# Patient Record
Sex: Male | Born: 1950 | Race: Black or African American | Hispanic: No | Marital: Married | Smoking: Former smoker
Health system: Southern US, Community
[De-identification: ages and names within clinical notes are randomized; demographics above are authoritative.]

## PROBLEM LIST (undated history)

## (undated) DIAGNOSIS — I513 Intracardiac thrombosis, not elsewhere classified: Secondary | ICD-10-CM

## (undated) DIAGNOSIS — I509 Heart failure, unspecified: Secondary | ICD-10-CM

## (undated) DIAGNOSIS — E785 Hyperlipidemia, unspecified: Secondary | ICD-10-CM

## (undated) DIAGNOSIS — I2510 Atherosclerotic heart disease of native coronary artery without angina pectoris: Secondary | ICD-10-CM

## (undated) DIAGNOSIS — I7121 Aneurysm of the ascending aorta, without rupture: Secondary | ICD-10-CM

## (undated) DIAGNOSIS — N2889 Other specified disorders of kidney and ureter: Secondary | ICD-10-CM

## (undated) DIAGNOSIS — I10 Essential (primary) hypertension: Secondary | ICD-10-CM

## (undated) DIAGNOSIS — I5020 Unspecified systolic (congestive) heart failure: Secondary | ICD-10-CM

## (undated) HISTORY — PX: CARDIAC CATHETERIZATION: SHX172

---

## 2020-06-06 ENCOUNTER — Ambulatory Visit: Payer: Self-pay | Attending: Internal Medicine

## 2020-06-06 DIAGNOSIS — Z23 Encounter for immunization: Secondary | ICD-10-CM

## 2020-06-06 NOTE — Progress Notes (Signed)
   Covid-19 Vaccination Clinic  Name:  Jevonte Clanton    MRN: [id number] DOB: [birth year]  06/06/2020  Mr. Burcher was observed post Covid-19 immunization for 15 minutes without incident. He was provided with Vaccine Information Sheet and instruction to access the V-Safe system.   Mr. Maready was instructed to call 911 with any severe reactions post vaccine: Marland Kitchen Difficulty breathing  . Swelling of face and throat  . A fast heartbeat  . A bad rash all over body  . Dizziness and weakness

## 2021-07-24 ENCOUNTER — Encounter: Payer: Self-pay | Admitting: Emergency Medicine

## 2021-07-24 ENCOUNTER — Other Ambulatory Visit: Payer: Self-pay

## 2021-07-24 ENCOUNTER — Ambulatory Visit
Admission: EM | Admit: 2021-07-24 | Discharge: 2021-07-24 | Disposition: A | Discharge status: Home / Self Care | Payer: Medicare Other | Attending: Physician Assistant | Admitting: Physician Assistant

## 2021-07-24 ENCOUNTER — Inpatient Hospital Stay (HOSPITAL_COMMUNITY)
Admission: EM | Admit: 2021-07-24 | Discharge: 2021-07-29 | DRG: 280 | Disposition: A | Discharge status: Home / Self Care | Payer: Medicare Other | Attending: Internal Medicine | Admitting: Internal Medicine

## 2021-07-24 ENCOUNTER — Emergency Department (HOSPITAL_COMMUNITY): Payer: Medicare Other

## 2021-07-24 DIAGNOSIS — R509 Fever, unspecified: Secondary | ICD-10-CM

## 2021-07-24 DIAGNOSIS — I110 Hypertensive heart disease with heart failure: Secondary | ICD-10-CM | POA: Diagnosis present

## 2021-07-24 DIAGNOSIS — R778 Other specified abnormalities of plasma proteins: Secondary | ICD-10-CM | POA: Diagnosis present

## 2021-07-24 DIAGNOSIS — I214 Non-ST elevation (NSTEMI) myocardial infarction: Principal | ICD-10-CM | POA: Diagnosis present

## 2021-07-24 DIAGNOSIS — I5041 Acute combined systolic (congestive) and diastolic (congestive) heart failure: Secondary | ICD-10-CM | POA: Diagnosis present

## 2021-07-24 DIAGNOSIS — N2889 Other specified disorders of kidney and ureter: Secondary | ICD-10-CM

## 2021-07-24 DIAGNOSIS — Z20822 Contact with and (suspected) exposure to covid-19: Secondary | ICD-10-CM | POA: Diagnosis present

## 2021-07-24 DIAGNOSIS — I10 Essential (primary) hypertension: Secondary | ICD-10-CM

## 2021-07-24 DIAGNOSIS — J101 Influenza due to other identified influenza virus with other respiratory manifestations: Secondary | ICD-10-CM | POA: Diagnosis present

## 2021-07-24 DIAGNOSIS — F17210 Nicotine dependence, cigarettes, uncomplicated: Secondary | ICD-10-CM | POA: Diagnosis present

## 2021-07-24 DIAGNOSIS — I2510 Atherosclerotic heart disease of native coronary artery without angina pectoris: Secondary | ICD-10-CM | POA: Diagnosis present

## 2021-07-24 DIAGNOSIS — I5082 Biventricular heart failure: Secondary | ICD-10-CM | POA: Diagnosis present

## 2021-07-24 DIAGNOSIS — E871 Hypo-osmolality and hyponatremia: Secondary | ICD-10-CM | POA: Diagnosis present

## 2021-07-24 DIAGNOSIS — I160 Hypertensive urgency: Secondary | ICD-10-CM

## 2021-07-24 DIAGNOSIS — T502X5A Adverse effect of carbonic-anhydrase inhibitors, benzothiadiazides and other diuretics, initial encounter: Secondary | ICD-10-CM | POA: Diagnosis present

## 2021-07-24 DIAGNOSIS — I7121 Aneurysm of the ascending aorta, without rupture: Secondary | ICD-10-CM | POA: Diagnosis present

## 2021-07-24 DIAGNOSIS — E861 Hypovolemia: Secondary | ICD-10-CM | POA: Diagnosis present

## 2021-07-24 DIAGNOSIS — I255 Ischemic cardiomyopathy: Secondary | ICD-10-CM | POA: Diagnosis present

## 2021-07-24 DIAGNOSIS — K7460 Unspecified cirrhosis of liver: Secondary | ICD-10-CM | POA: Diagnosis present

## 2021-07-24 DIAGNOSIS — D72819 Decreased white blood cell count, unspecified: Secondary | ICD-10-CM | POA: Diagnosis present

## 2021-07-24 DIAGNOSIS — I513 Intracardiac thrombosis, not elsewhere classified: Secondary | ICD-10-CM | POA: Diagnosis present

## 2021-07-24 DIAGNOSIS — N179 Acute kidney failure, unspecified: Secondary | ICD-10-CM | POA: Diagnosis present

## 2021-07-24 DIAGNOSIS — J111 Influenza due to unidentified influenza virus with other respiratory manifestations: Secondary | ICD-10-CM

## 2021-07-24 DIAGNOSIS — D696 Thrombocytopenia, unspecified: Secondary | ICD-10-CM | POA: Diagnosis present

## 2021-07-24 DIAGNOSIS — C649 Malignant neoplasm of unspecified kidney, except renal pelvis: Secondary | ICD-10-CM | POA: Diagnosis present

## 2021-07-24 DIAGNOSIS — E785 Hyperlipidemia, unspecified: Secondary | ICD-10-CM | POA: Diagnosis present

## 2021-07-24 DIAGNOSIS — E876 Hypokalemia: Secondary | ICD-10-CM | POA: Diagnosis present

## 2021-07-24 DIAGNOSIS — Z79899 Other long term (current) drug therapy: Secondary | ICD-10-CM

## 2021-07-24 HISTORY — DX: Intracardiac thrombosis, not elsewhere classified: I51.3

## 2021-07-24 HISTORY — DX: Aneurysm of the ascending aorta, without rupture: I71.21

## 2021-07-24 HISTORY — DX: Essential (primary) hypertension: I10

## 2021-07-24 HISTORY — DX: Atherosclerotic heart disease of native coronary artery without angina pectoris: I25.10

## 2021-07-24 HISTORY — DX: Hyperlipidemia, unspecified: E78.5

## 2021-07-24 HISTORY — DX: Unspecified systolic (congestive) heart failure: I50.20

## 2021-07-24 HISTORY — DX: Other specified disorders of kidney and ureter: N28.89

## 2021-07-24 LAB — BASIC METABOLIC PANEL
Anion gap: 7 (ref 5–15)
BUN: 19 mg/dL (ref 8–23)
CO2: 27 mmol/L (ref 22–32)
Calcium: 8.2 mg/dL — ABNORMAL LOW (ref 8.9–10.3)
Chloride: 101 mmol/L (ref 98–111)
Creatinine, Ser: 1.56 mg/dL — ABNORMAL HIGH (ref 0.61–1.24)
GFR, Estimated: 47 mL/min — ABNORMAL LOW (ref >60)
Glucose, Bld: 110 mg/dL — ABNORMAL HIGH (ref 70–99)
Potassium: 4.4 mmol/L (ref 3.5–5.1)
Sodium: 135 mmol/L (ref 135–145)

## 2021-07-24 LAB — CBC WITH DIFFERENTIAL/PLATELET
Abs Immature Granulocytes: 0.02 10*3/uL (ref 0.00–0.07)
Basophils Absolute: 0 10*3/uL (ref 0.0–0.1)
Basophils Relative: 1 %
Eosinophils Absolute: 0 10*3/uL (ref 0.0–0.5)
Eosinophils Relative: 0 %
HCT: 47.2 % (ref 39.0–52.0)
Hemoglobin: 15.6 g/dL (ref 13.0–17.0)
Immature Granulocytes: 0 %
Lymphocytes Relative: 28 %
Lymphs Abs: 1.5 10*3/uL (ref 0.7–4.0)
MCH: 31.9 pg (ref 26.0–34.0)
MCHC: 33.1 g/dL (ref 30.0–36.0)
MCV: 96.5 fL (ref 80.0–100.0)
Monocytes Absolute: 0.5 10*3/uL (ref 0.1–1.0)
Monocytes Relative: 10 %
Neutro Abs: 3.3 10*3/uL (ref 1.7–7.7)
Neutrophils Relative %: 61 %
Platelets: 111 10*3/uL — ABNORMAL LOW (ref 150–400)
RBC: 4.89 MIL/uL (ref 4.22–5.81)
RDW: 15.3 % (ref 11.5–15.5)
WBC: 5.4 10*3/uL (ref 4.0–10.5)
nRBC: 0 % (ref 0.0–0.2)

## 2021-07-24 LAB — TROPONIN I (HIGH SENSITIVITY): Troponin I (High Sensitivity): 159 ng/L (ref <18)

## 2021-07-24 LAB — POCT FASTING CBG KUC MANUAL ENTRY: POCT Glucose (KUC): 117 mg/dL — AB (ref 70–99)

## 2021-07-24 LAB — RESP PANEL BY RT-PCR (FLU A&B, COVID) ARPGX2
Influenza A by PCR: POSITIVE — AB
Influenza B by PCR: NEGATIVE
SARS Coronavirus 2 by RT PCR: NEGATIVE

## 2021-07-24 IMAGING — DX DG CHEST 2V
2 series · 2 of 2 positions shown · non-contrast
Comparison: [DATE].

CLINICAL DATA: Cough, congestion, sore throat.

EXAM:
CHEST - 2 VIEW

[w chest pa]
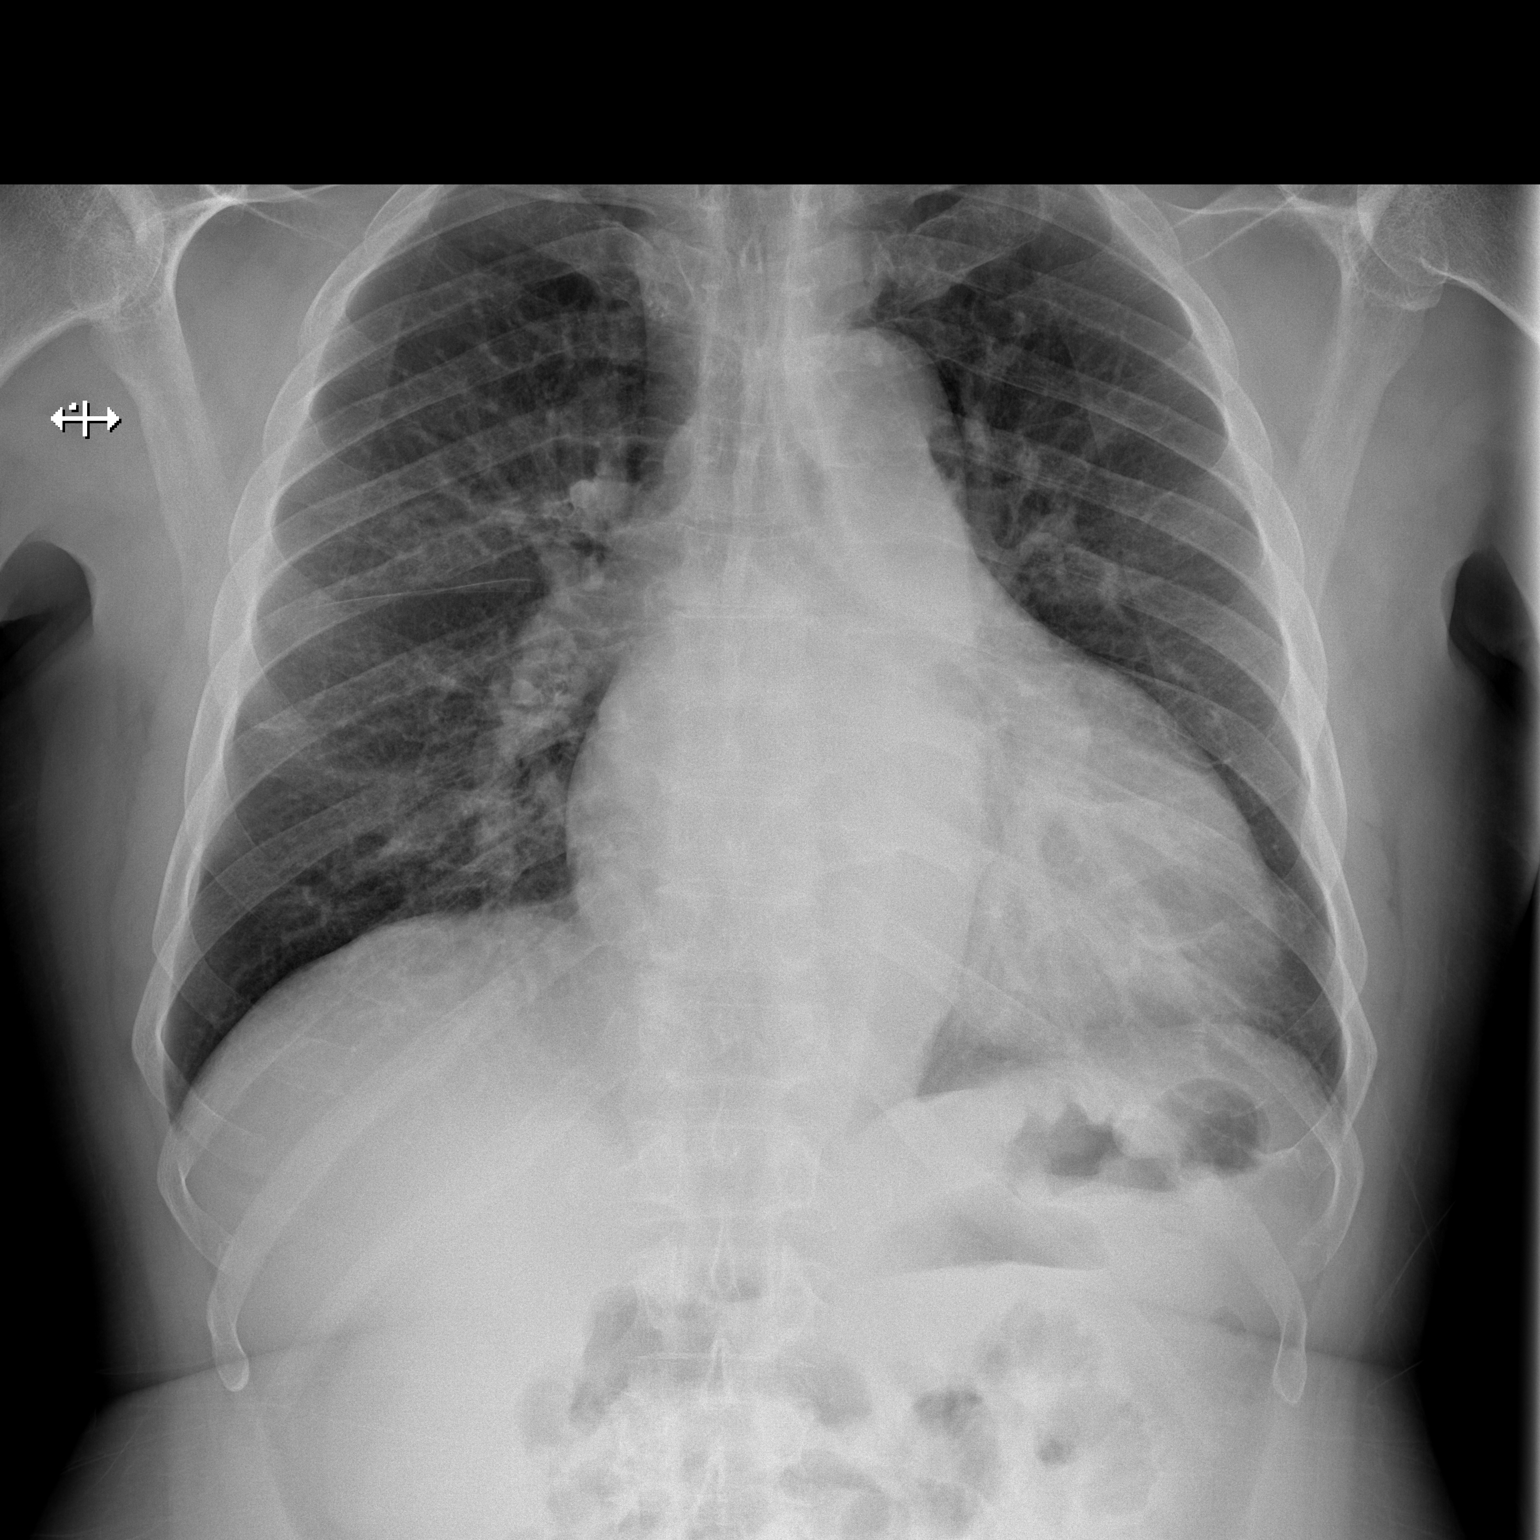

[w chest lat]
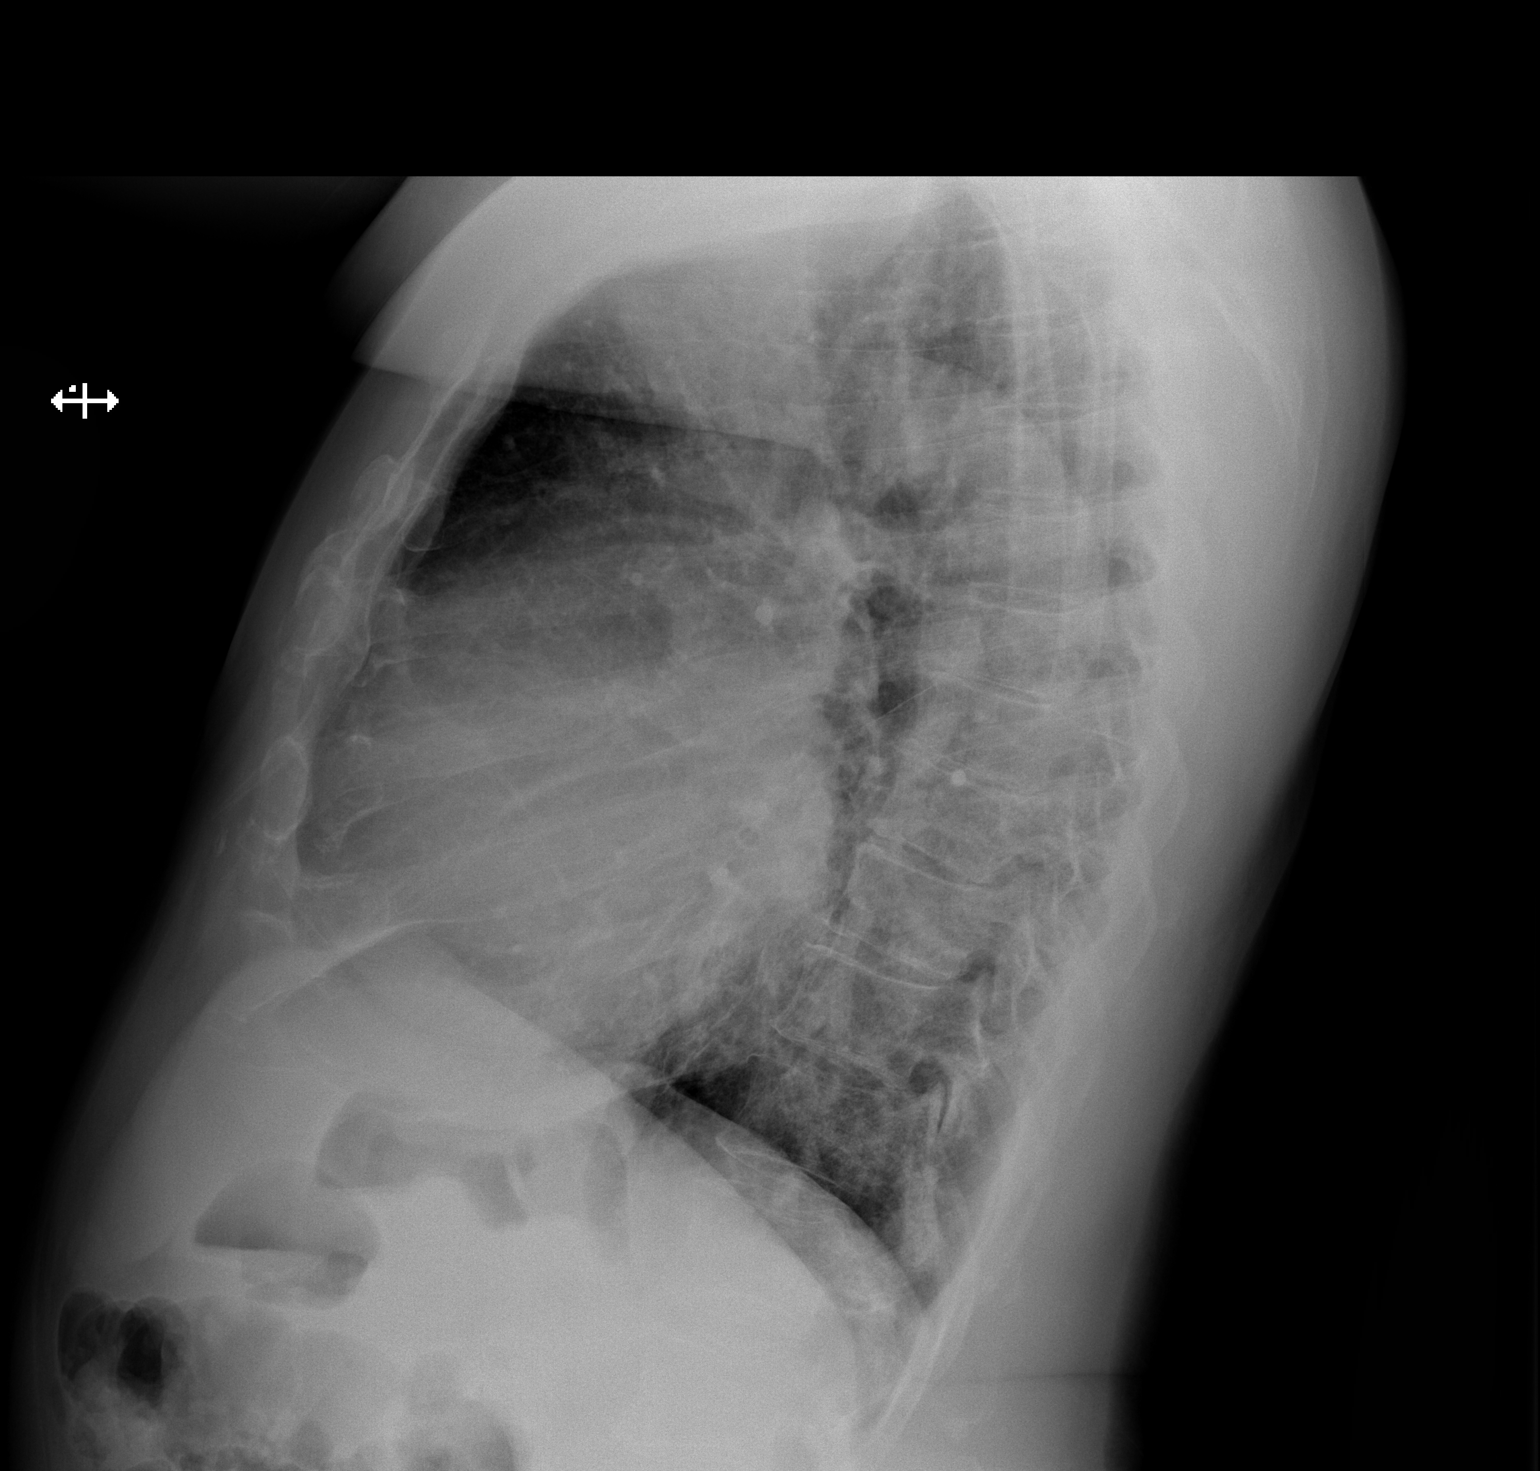

[2 of 2 positions shown; findings below may reference images not displayed]

FINDINGS: The heart is enlarged. The mediastinal structures are within normal
limits. No consolidation, effusion, or pneumothorax is seen. No
acute osseous abnormality.
IMPRESSION: Cardiomegaly with no acute process.

## 2021-07-24 MED ORDER — AMLODIPINE BESYLATE 5 MG PO TABS
5.0000 mg | ORAL_TABLET | Freq: Once | ORAL | Status: AC
  Administered 2021-07-24: 5 mg via ORAL
  Filled 2021-07-24: qty 1

## 2021-07-24 MED ORDER — OSELTAMIVIR PHOSPHATE 75 MG PO CAPS: 75.0000 mg | ORAL_CAPSULE | Freq: Once | ORAL | Status: DC

## 2021-07-24 MED ORDER — LABETALOL HCL 5 MG/ML IV SOLN
5.0000 mg | Freq: Once | INTRAVENOUS | Status: AC
  Administered 2021-07-24: 5 mg via INTRAVENOUS
  Filled 2021-07-24: qty 4

## 2021-07-24 NOTE — ED Triage Notes (Signed)
Yesterday checked BP and realized it was very high, also noticed he was fatigued with a temperature. Covid test at home was negative. Chronic cough, worse since yesterday. Trx fever with motrin. Family states he hasn't been himself, has seemed disoriented since yesterday around 3 pm. Pt c/o chest pain that started Wednesday.

## 2021-07-24 NOTE — ED Provider Notes (Signed)
Emergency Medicine Provider Triage Evaluation Note  Troy May , a 70 y.o. male  was evaluated in triage.  Pt complains of cough, fever with T-max of 106 F per patient, and elevated blood pressure at home for the last 3 days.  He is vaccinated for COVID and influenza.  Endorses some shortness of breath with cough as well as chest wall pain with coughing.  Review of Systems  Positive: Cough, shortness of breath, chest wall pain, fevers, weakness Negative: Diarrhea, nausea, vomiting  Physical Exam  BP (!) 166/119 (BP Location: Right Arm)   Pulse 80   Temp (!) 100.5 F (38.1 C) (Oral)   Resp 18   Ht 5\' 10"  (1.778 m)   Wt 90.7 kg   SpO2 96%   BMI 28.70 kg/m  Gen:   Awake, no distress   Resp:  Normal effort  MSK:   Moves extremities without difficulty  Other:  RRR no m/r/g; lungs CTAB  Medical Decision Making  Medically screening exam initiated at 7:36 PM.  Appropriate orders placed.  Matei Magnone was informed that the remainder of the evaluation will be completed by another provider, this initial triage assessment does not replace that evaluation, and the importance of remaining in the ED until their evaluation is complete.  This chart was dictated using voice recognition software, Dragon. Despite the best efforts of this provider to proofread and correct errors, errors may still occur which can change documentation meaning.    Aura Dials 07/24/21 Roseanne Reno, MD 07/24/21 2027

## 2021-07-24 NOTE — ED Triage Notes (Signed)
Pt c/o high blood pressure, fever, and a cough for the past couple of days.

## 2021-07-24 NOTE — ED Provider Notes (Signed)
[facility] EMERGENCY DEPARTMENT Provider Note   CSN: [id number] Arrival date & time: 07/24/21  1848     History Chief Complaint  Patient presents with   Hypertension   Cough    Troy May is a 70 y.o. male.  The history is provided by the patient and medical records.  Cough Cough characteristics:  Non-productive Sputum characteristics:  Unable to specify Severity:  Moderate Onset quality:  Gradual Duration:  3 days Timing:  Constant Progression:  Worsening Smoker: no   Context: sick contacts and upper respiratory infection   Relieved by:  Nothing Worsened by:  Nothing Associated symptoms: chest pain, chills and fever   Associated symptoms: no ear pain, no rash, no shortness of breath and no sore throat       History reviewed. No pertinent past medical history.  Patient Active Problem List   Diagnosis Date Noted   NSTEMI (non-ST elevated myocardial infarction) ([place]) 07/25/2021   Influenza A 07/25/2021   Essential hypertension 07/25/2021   AKI (acute kidney injury) ([place]) 07/25/2021   Elevated troponin 07/25/2021    History reviewed. No pertinent surgical history.     History reviewed. No pertinent family history.  Social History   Tobacco Use   Smoking status: Some Days    Types: Cigarettes    Home Medications Prior to Admission medications   Medication Sig Start Date End Date Taking? Authorizing Provider  cetirizine (ZYRTEC) 10 MG tablet Take 10 mg by mouth daily as needed for allergies.   Yes [provider]  ibuprofen (ADVIL) 200 MG tablet Take 600 mg by mouth every 6 (six) hours as needed for fever or headache.   Yes [provider]  OVER THE COUNTER MEDICATION Take 1 tablet by mouth daily. gummy   Yes [provider]    Allergies    Patient has no known allergies.  Review of Systems   Review of Systems  Constitutional:  Positive for chills, fatigue and fever.  HENT:  Negative for ear pain and  sore throat.   Eyes:  Negative for pain and visual disturbance.  Respiratory:  Positive for cough. Negative for shortness of breath.   Cardiovascular:  Positive for chest pain. Negative for palpitations.  Gastrointestinal:  Negative for abdominal pain and vomiting.  Genitourinary:  Negative for dysuria and hematuria.  Musculoskeletal:  Negative for arthralgias and back pain.  Skin:  Negative for color change and rash.  Neurological:  Negative for seizures and syncope.  All other systems reviewed and are negative.  Physical Exam Updated Vital Signs BP (!) 140/107   Pulse 65   Temp 98.3 F (36.8 C) (Oral)   Resp 19   Ht 5\' 10"  (1.778 m)   Wt 90.7 kg   SpO2 93%   BMI 28.70 kg/m   Physical Exam Vitals and nursing note reviewed.  Constitutional:      General: He is not in acute distress.    Appearance: Normal appearance. He is well-developed. He is not toxic-appearing or diaphoretic.  HENT:     Head: Normocephalic and atraumatic.     Right Ear: External ear normal.     Left Ear: External ear normal.     Nose: Nose normal. No congestion.     Mouth/Throat:     Mouth: Mucous membranes are moist.     Pharynx: Oropharynx is clear. No posterior oropharyngeal erythema.  Eyes:     Extraocular Movements: Extraocular movements intact.     Conjunctiva/sclera: Conjunctivae normal.  Pupils: Pupils are equal, round, and reactive to light.  Cardiovascular:     Rate and Rhythm: Normal rate and regular rhythm.     Pulses: Normal pulses.     Heart sounds: No murmur heard. Pulmonary:     Effort: Pulmonary effort is normal. No respiratory distress.     Breath sounds: Normal breath sounds. No wheezing, rhonchi or rales.  Abdominal:     General: Abdomen is flat. Bowel sounds are normal.     Palpations: Abdomen is soft.     Tenderness: There is no abdominal tenderness. There is no guarding or rebound.  Musculoskeletal:        General: No swelling, tenderness or deformity. Normal range of  motion.     Cervical back: Normal range of motion and neck supple. No rigidity.  Skin:    General: Skin is warm and dry.     Capillary Refill: Capillary refill takes less than 2 seconds.     Findings: No rash.  Neurological:     General: No focal deficit present.     Mental Status: He is alert and oriented to person, place, and time.     Motor: No weakness.     Coordination: Coordination normal.     Gait: Gait normal.  Psychiatric:        Mood and Affect: Mood normal.    ED Results / Procedures / Treatments   Labs (all labs ordered are listed, but only abnormal results are displayed) Labs Reviewed  RESP PANEL BY RT-PCR (FLU A&B, COVID) ARPGX2 - Abnormal; Notable for the following components:      Result Value   Influenza A by PCR POSITIVE (*)    All other components within normal limits  BASIC METABOLIC PANEL - Abnormal; Notable for the following components:   Glucose, Bld 110 (*)    Creatinine, Ser 1.56 (*)    Calcium 8.2 (*)    GFR, Estimated 47 (*)    All other components within normal limits  CBC WITH DIFFERENTIAL/PLATELET - Abnormal; Notable for the following components:   Platelets 111 (*)    All other components within normal limits  BRAIN NATRIURETIC PEPTIDE - Abnormal; Notable for the following components:   B Natriuretic Peptide 1,111.1 (*)    All other components within normal limits  BASIC METABOLIC PANEL - Abnormal; Notable for the following components:   Sodium 132 (*)    Creatinine, Ser 1.28 (*)    Calcium 7.8 (*)    All other components within normal limits  LIPID PANEL - Abnormal; Notable for the following components:   HDL 39 (*)    LDL Cholesterol 123 (*)    All other components within normal limits  TROPONIN I (HIGH SENSITIVITY) - Abnormal; Notable for the following components:   Troponin I (High Sensitivity) 159 (*)    All other components within normal limits  TROPONIN I (HIGH SENSITIVITY) - Abnormal; Notable for the following components:    Troponin I (High Sensitivity) 161 (*)    All other components within normal limits  URINALYSIS, ROUTINE W REFLEX MICROSCOPIC  HIV ANTIBODY (ROUTINE TESTING W REFLEX)  HEPARIN LEVEL (UNFRACTIONATED)    EKG EKG Interpretation  Date/Time:  Friday July 24 2021 21:24:34 EST Ventricular Rate:  95 PR Interval:  164 QRS Duration: 112 QT Interval:  354 QTC Calculation: 445 R Axis:   131 Text Interpretation: Sinus rhythm Probable left atrial enlargement Anterolateral infarct, age indeterminate Baseline wander in lead(s) V3 Confirmed by Ripley Fraise [phone])  on 07/24/2021 11:31:41 PM  Radiology DG Chest 2 View  Result Date: 07/24/2021 CLINICAL DATA:  Cough, congestion, sore throat. EXAM: CHEST - 2 VIEW COMPARISON:  06/28/2008. FINDINGS: The heart is enlarged. The mediastinal structures are within normal limits. No consolidation, effusion, or pneumothorax is seen. No acute osseous abnormality. IMPRESSION: Cardiomegaly with no acute process. Electronically Signed   By: Brett Fairy M.D.   On: 07/24/2021 21:14   CT Angio Chest Pulmonary Embolism (PE) W or WO Contrast  Result Date: 07/25/2021 CLINICAL DATA:  Fever, fatigue.  Evaluate for pulmonary embolism. EXAM: CT ANGIOGRAPHY CHEST WITH CONTRAST TECHNIQUE: Multidetector CT imaging of the chest was performed using the standard protocol during bolus administration of intravenous contrast. Multiplanar CT image reconstructions and MIPs were obtained to evaluate the vascular anatomy. CONTRAST:  164mL OMNIPAQUE IOHEXOL 350 MG/ML SOLN COMPARISON:  None. FINDINGS: Vascular Findings: There is adequate opacification of the pulmonary arterial system with the main pulmonary artery measuring 419 Hounsfield units. There are no discrete filling defects within the pulmonary arterial tree to suggest pulmonary embolism. Borderline enlarged caliber the main pulmonary artery measuring 33 mm in diameter. Marked cardiomegaly, in particular, there is enlargement of  the left ventricle. Coronary artery calcifications. No pericardial effusion though a small amount of fluid is seen within the pericardial recess. Mild fusiform aneurysmal dilatation of the ascending thoracic aorta measuring 43 mm in diameter (image 55, series 7). No evidence of an intramural hematoma. Review of the MIP images confirms the above findings. ---------------------------------------------------------------------------------- Nonvascular Findings: Mediastinum/Lymph Nodes: Evaluation for mediastinal and hilar adenopathy is degraded secondary to tailoring the examination for evaluation of the pulmonary arteries. Scattered mediastinal lymph nodes are mildly prominent though not enlarged by size criteria with index prevascular lymph node measuring 0.9 cm in greatest short axis diameter (image 43, series 8). No definitive bulky mediastinal, hilar or axillary lymphadenopathy. Lungs/Pleura: Circumferential bronchial wall thickening, most conspicuously involving the bilateral lower lobes. Scattered ill-defined somewhat tree-in-bud ground-glass opacities are seen with the right upper (images 50 and 55, series 8) and lower lobes (image 77 and 86, series 8). No associated air bronchograms. Minimal bibasilar ground-glass atelectasis. No pleural effusion or pneumothorax. Apical predominant centrilobular and paraseptal emphysematous change. There is a punctate (3 mm) pulmonary nodule within right middle lobe (image 73, series 8). Upper abdomen: Limited early arterial phase evaluation of the upper abdomen demonstrates isoattenuating bilateral renal masses, the right measuring 5.5 x 4.6 cm (image 129, series 7, and the left measuring 4.8 x 4.1 cm (image 159, series 7). Mild nodularity of the hepatic contour as could be seen in the setting of cirrhotic change. Musculoskeletal: No acute or aggressive osseous abnormalities. Regional soft tissues appear normal. Normal appearance of the thyroid gland. IMPRESSION: 1. No  evidence of pulmonary embolism. 2. Diffuse bronchial wall thickening with scattered ill-defined somewhat tree-in-bud ground-glass opacities within the right upper and lower lobes, nonspecific though could be seen in the setting of early atypical infection superimposed on bronchitis/airways disease. 3. Bilateral isoattenuating renal masses, the right measuring 5.5 cm and the left measuring 3.8 cm, incompletely evaluated on the present examination, though worrisome for renal cell carcinomas. Further evaluation with nonemergent renal protocol CT or MRI could be performed as indicated. 4.  Emphysema (ICD10-J43.9). 5. Punctate (3 mm) right middle lobe pulmonary nodule. Comparison with prior outside examinations is advised. Pending the results of the above recommended dedicated renal imaging, a non-contrast chest CT can be considered in 12 months given patient's emphysematous change. This recommendation follows  the consensus statement: Guidelines for Management of Incidental Pulmonary Nodules Detected on CT Images: From the Fleischner Society 2017; Radiology 2017; 284:228-243. 6. 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 Recommend annual imaging followup by CTA or MRA. This recommendation follows 2010 ACCF/AHA/AATS/ACR/ASA/SCA/SCAI/SIR/STS/SVM Guidelines for the Diagnosis and Management of Patients with Thoracic Aortic Disease. Circulation. 2010; 121: W098-J191. Aortic aneurysm NOS (ICD10-I71.9) 7. Marked cardiomegaly with enlargement of the caliber the main pulmonary artery, nonspecific though could be seen in the setting pulmonary arterial hypertension. Further evaluation with cardiac echo could be performed as indicated. 8. Coronary artery calcifications. Aortic Atherosclerosis (ICD10-I70.0). 9. Mild nodularity hepatic contour as could be seen in the setting of cirrhotic change. Correlation with LFTs is advised. Electronically Signed   By: Sandi Mariscal M.D.   On:  07/25/2021 12:18   ECHOCARDIOGRAM COMPLETE  Result Date: 07/25/2021    ECHOCARDIOGRAM REPORT   Patient Name:   Troy May Date of Exam: 07/25/2021 Medical Rec #:  [id number]  Height:       70.0 in Accession #:    [id number] Weight:       200.0 lb Date of Birth:  Mar 25, 1951  BSA:          2.087 m Patient Age:    89 years   BP:           126/91 mmHg Patient Gender: M          HR:           76 bpm. Exam Location:  Inpatient Procedure: 2D Echo, Cardiac Doppler, Color Doppler and Intracardiac            Opacification Agent STAT ECHO Indications:    Acute ischemic heart disease, chest pain, elevated troponin  History:        Patient has no prior history of Echocardiogram examinations.                 Risk Factors:Hypertension.  Sonographer:    Maudry Mayhew MHA, RDMS, RVT, RDCS Referring Phys: [id number] [place]  1. Left ventricular ejection fraction, by estimation, is 25 to 30%. The left ventricle has severely decreased function. The left ventricle demonstrates regional wall motion abnormalities (see scoring diagram/findings for description). The left ventricular internal cavity size was mildly dilated. There is mild left ventricular hypertrophy. Left ventricular diastolic parameters are consistent with Grade II diastolic dysfunction (pseudonormalization). Elevated left atrial pressure.  2. Large LV apical thrombus measuring 2.5cm x 1.8cm  3. Right ventricular systolic function is moderately reduced. The right ventricular size is moderately enlarged. There is mildly elevated pulmonary artery systolic pressure. The estimated right ventricular systolic pressure is 13.2 mmHg.  4. Right atrial size was mildly dilated.  5. The mitral valve is normal in structure. Trivial mitral valve regurgitation.  6. The aortic valve is tricuspid. Aortic valve regurgitation is not visualized. No aortic stenosis is present.  7. The inferior vena cava is dilated in size with >50% respiratory variability, suggesting  right atrial pressure of 8 mmHg. FINDINGS  Left Ventricle: Left ventricular ejection fraction, by estimation, is 25 to 30%. The left ventricle has severely decreased function. The left ventricle demonstrates regional wall motion abnormalities. Definity contrast agent was given IV to delineate the left ventricular endocardial borders. The left ventricular internal cavity size was mildly dilated. There is mild left ventricular hypertrophy. Left ventricular diastolic parameters are consistent with Grade II diastolic dysfunction (pseudonormalization). Elevated left atrial pressure.  LV Wall  Scoring: The mid and distal lateral wall, entire apex, entire inferior wall, and posterior wall are akinetic. The anterior wall, antero-lateral wall, anterior septum, mid inferoseptal segment, and basal inferoseptal segment are normal. Right Ventricle: The right ventricular size is moderately enlarged. Right vetricular wall thickness was not well visualized. Right ventricular systolic function is moderately reduced. There is mildly elevated pulmonary artery systolic pressure. The tricuspid regurgitant velocity is 2.73 m/s, and with an assumed right atrial pressure of 8 mmHg, the estimated right ventricular systolic pressure is 70.9 mmHg. Left Atrium: Left atrial size was normal in size. Right Atrium: Right atrial size was mildly dilated. Pericardium: There is no evidence of pericardial effusion. Mitral Valve: The mitral valve is normal in structure. Trivial mitral valve regurgitation. Tricuspid Valve: The tricuspid valve is normal in structure. Tricuspid valve regurgitation is trivial. Aortic Valve: The aortic valve is tricuspid. Aortic valve regurgitation is not visualized. No aortic stenosis is present. Aortic valve mean gradient measures 1.0 mmHg. Aortic valve peak gradient measures 3.0 mmHg. Aortic valve area, by VTI measures 2.18 cm. Pulmonic Valve: The pulmonic valve was not well visualized. Pulmonic valve regurgitation is not  visualized. Aorta: The aortic root is normal in size and structure. Venous: The inferior vena cava is dilated in size with greater than 50% respiratory variability, suggesting right atrial pressure of 8 mmHg. IAS/Shunts: The interatrial septum was not well visualized.  LEFT VENTRICLE PLAX 2D LVIDd:         5.80 cm   Diastology LVIDs:         5.30 cm   LV e' medial:    4.53 cm/s LV PW:         1.10 cm   LV E/e' medial:  15.6 LV IVS:        1.20 cm   LV e' lateral:   5.22 cm/s LVOT diam:     1.70 cm   LV E/e' lateral: 13.5 LV SV:         28 LV SV Index:   13 LVOT Area:     2.27 cm  RIGHT VENTRICLE RV S prime:     7.01 cm/s TAPSE (M-mode): 1.1 cm LEFT ATRIUM           Index        RIGHT ATRIUM           Index LA diam:      4.30 cm 2.06 cm/m   RA Area:     22.10 cm LA Vol (A2C): 58.0 ml 27.79 ml/m  RA Volume:   75.50 ml  36.17 ml/m LA Vol (A4C): 63.5 ml 30.42 ml/m  AORTIC VALVE AV Area (Vmax):    1.70 cm AV Area (Vmean):   1.74 cm AV Area (VTI):     2.18 cm AV Vmax:           86.10 cm/s AV Vmean:          50.400 cm/s AV VTI:            0.128 m AV Peak Grad:      3.0 mmHg AV Mean Grad:      1.0 mmHg LVOT Vmax:         64.30 cm/s LVOT Vmean:        38.600 cm/s LVOT VTI:          0.123 m LVOT/AV VTI ratio: 0.96  AORTA Ao Root diam: 3.20 cm MITRAL VALVE  TRICUSPID VALVE MV Area (PHT): 7.51 cm    TR Peak grad:   29.8 mmHg MV Decel Time: 101 msec    TR Vmax:        273.00 cm/s MR Peak grad: 44.5 mmHg MR Vmax:      333.50 cm/s  SHUNTS MV E velocity: 70.70 cm/s  Systemic VTI:  0.12 m MV A velocity: 46.30 cm/s  Systemic Diam: 1.70 cm MV E/A ratio:  1.53 Oswaldo Milian MD Electronically signed by Oswaldo Milian MD Signature Date/Time: 07/25/2021/12:12:47 PM    Final     Procedures Procedures   Medications Ordered in ED Medications  lactated ringers infusion ( Intravenous New Bag/Given 07/25/21 0223)  acetaminophen (TYLENOL) tablet 650 mg (has no administration in time range)    Or   acetaminophen (TYLENOL) suppository 650 mg (has no administration in time range)  ondansetron (ZOFRAN) tablet 4 mg (has no administration in time range)    Or  ondansetron (ZOFRAN) injection 4 mg (has no administration in time range)  senna-docusate (Senokot-S) tablet 1 tablet (has no administration in time range)  aspirin EC tablet 81 mg (81 mg Oral Not Given 07/25/21 1037)  amLODipine (NORVASC) tablet 10 mg (10 mg Oral Not Given 07/25/21 1038)  losartan (COZAAR) tablet 50 mg (50 mg Oral Given 07/25/21 0224)  oseltamivir (TAMIFLU) capsule 30 mg (30 mg Oral Not Given 07/25/21 1038)  perflutren lipid microspheres (DEFINITY) IV suspension (3 mLs Intravenous Given 07/25/21 0900)  heparin ADULT infusion 100 units/mL (25000 units/279mL) (1,450 Units/hr Intravenous New Bag/Given 07/25/21 1053)  amLODipine (NORVASC) tablet 5 mg (5 mg Oral Given 07/24/21 2214)  labetalol (NORMODYNE) injection 5 mg (5 mg Intravenous Given 07/24/21 2213)  heparin bolus via infusion 2,700 Units (2,700 Units Intravenous Bolus from Bag 07/25/21 1053)  iohexol (OMNIPAQUE) 350 MG/ML injection 100 mL (100 mLs Intravenous Contrast Given 07/25/21 1156)    ED Course  I have reviewed the triage vital signs and the nursing notes.  Pertinent labs & imaging results that were available during my care of the patient were reviewed by me and considered in my medical decision making (see chart for details).    MDM Rules/Calculators/A&P                         Previously healthy 70 year old gentleman with no diagnosed medical problems who is presenting with chronic cough, worsening over the past 3 days with associated fevers, chills, fatigue. Now w/ chest pain that occurs when coughing. He is hypertensive on arrival.  Mildly febrile.  Otherwise hemodynamically stable.  Exam as above.  Low concern for hypertensive emergency.  His neuro exam is nonfocal.  He has no current headache. No recent vision changes. No indications for head  imaging at this time. Pt given labetalol iv push and started on amlodipine. EKG shows inferior Q waves, but no acute ST elevations or depressions.  Initial troponin elevated to 159. Delta pending.  No indications for heparin gtt at this time. BNP elevated to 1111. No current chest pain.  May have new onset HF and would benefit from admission for further cardiac evaluation w/ echo +/-  diuresis. He has no o2 req. Mildly tachypneic but no respiratory distress. Flu test positive. Cough may be related to viral illness versus new HF.  Chest x-ray showed cardiomegaly without focal consolidation or interstitial edema. No significant peripheral edema on exam. Will defer diuresis to inpatient team. Medicine team contacted for admission. Hand off given.  Final Clinical Impression(s) / ED Diagnoses Final diagnoses:  Influenza  Hypertension, unspecified type  NSTEMI (non-ST elevated myocardial infarction) [facility])    Rx / [place] Orders ED Discharge Orders     None        Idamae Lusher, MD 07/25/21 1303    Luna Fuse, MD 07/29/21 1430

## 2021-07-25 ENCOUNTER — Observation Stay (HOSPITAL_COMMUNITY): Payer: Medicare Other

## 2021-07-25 ENCOUNTER — Inpatient Hospital Stay (HOSPITAL_COMMUNITY): Payer: Medicare Other

## 2021-07-25 ENCOUNTER — Encounter (HOSPITAL_COMMUNITY): Payer: Self-pay | Admitting: Family Medicine

## 2021-07-25 DIAGNOSIS — E785 Hyperlipidemia, unspecified: Secondary | ICD-10-CM | POA: Diagnosis present

## 2021-07-25 DIAGNOSIS — T502X5A Adverse effect of carbonic-anhydrase inhibitors, benzothiadiazides and other diuretics, initial encounter: Secondary | ICD-10-CM | POA: Diagnosis present

## 2021-07-25 DIAGNOSIS — R778 Other specified abnormalities of plasma proteins: Secondary | ICD-10-CM | POA: Diagnosis present

## 2021-07-25 DIAGNOSIS — I5082 Biventricular heart failure: Secondary | ICD-10-CM | POA: Diagnosis present

## 2021-07-25 DIAGNOSIS — J101 Influenza due to other identified influenza virus with other respiratory manifestations: Secondary | ICD-10-CM | POA: Diagnosis present

## 2021-07-25 DIAGNOSIS — I110 Hypertensive heart disease with heart failure: Secondary | ICD-10-CM | POA: Diagnosis present

## 2021-07-25 DIAGNOSIS — I21A1 Myocardial infarction type 2: Secondary | ICD-10-CM | POA: Diagnosis not present

## 2021-07-25 DIAGNOSIS — E861 Hypovolemia: Secondary | ICD-10-CM | POA: Diagnosis present

## 2021-07-25 DIAGNOSIS — I5041 Acute combined systolic (congestive) and diastolic (congestive) heart failure: Secondary | ICD-10-CM | POA: Diagnosis present

## 2021-07-25 DIAGNOSIS — K7460 Unspecified cirrhosis of liver: Secondary | ICD-10-CM | POA: Diagnosis present

## 2021-07-25 DIAGNOSIS — N179 Acute kidney failure, unspecified: Secondary | ICD-10-CM

## 2021-07-25 DIAGNOSIS — I2510 Atherosclerotic heart disease of native coronary artery without angina pectoris: Secondary | ICD-10-CM | POA: Diagnosis present

## 2021-07-25 DIAGNOSIS — Z79899 Other long term (current) drug therapy: Secondary | ICD-10-CM | POA: Diagnosis not present

## 2021-07-25 DIAGNOSIS — F17210 Nicotine dependence, cigarettes, uncomplicated: Secondary | ICD-10-CM | POA: Diagnosis present

## 2021-07-25 DIAGNOSIS — I10 Essential (primary) hypertension: Secondary | ICD-10-CM | POA: Diagnosis not present

## 2021-07-25 DIAGNOSIS — E876 Hypokalemia: Secondary | ICD-10-CM | POA: Diagnosis present

## 2021-07-25 DIAGNOSIS — I5089 Other heart failure: Secondary | ICD-10-CM | POA: Diagnosis not present

## 2021-07-25 DIAGNOSIS — C649 Malignant neoplasm of unspecified kidney, except renal pelvis: Secondary | ICD-10-CM | POA: Diagnosis present

## 2021-07-25 DIAGNOSIS — R7989 Other specified abnormal findings of blood chemistry: Secondary | ICD-10-CM | POA: Diagnosis not present

## 2021-07-25 DIAGNOSIS — D72819 Decreased white blood cell count, unspecified: Secondary | ICD-10-CM | POA: Diagnosis present

## 2021-07-25 DIAGNOSIS — D696 Thrombocytopenia, unspecified: Secondary | ICD-10-CM | POA: Diagnosis present

## 2021-07-25 DIAGNOSIS — R079 Chest pain, unspecified: Secondary | ICD-10-CM

## 2021-07-25 DIAGNOSIS — I7121 Aneurysm of the ascending aorta, without rupture: Secondary | ICD-10-CM | POA: Diagnosis present

## 2021-07-25 DIAGNOSIS — I248 Other forms of acute ischemic heart disease: Secondary | ICD-10-CM

## 2021-07-25 DIAGNOSIS — Z20822 Contact with and (suspected) exposure to covid-19: Secondary | ICD-10-CM | POA: Diagnosis present

## 2021-07-25 DIAGNOSIS — I513 Intracardiac thrombosis, not elsewhere classified: Secondary | ICD-10-CM | POA: Diagnosis present

## 2021-07-25 DIAGNOSIS — I214 Non-ST elevation (NSTEMI) myocardial infarction: Secondary | ICD-10-CM | POA: Diagnosis present

## 2021-07-25 DIAGNOSIS — E871 Hypo-osmolality and hyponatremia: Secondary | ICD-10-CM | POA: Diagnosis present

## 2021-07-25 DIAGNOSIS — I5021 Acute systolic (congestive) heart failure: Secondary | ICD-10-CM | POA: Diagnosis not present

## 2021-07-25 DIAGNOSIS — I255 Ischemic cardiomyopathy: Secondary | ICD-10-CM | POA: Diagnosis present

## 2021-07-25 LAB — ECHOCARDIOGRAM COMPLETE
AR max vel: 1.7 cm2
AV Area VTI: 2.18 cm2
AV Area mean vel: 1.74 cm2
AV Mean grad: 1 mmHg
AV Peak grad: 3 mmHg
Ao pk vel: 0.86 m/s
Area-P 1/2: 7.51 cm2
Height: 70 in
MV M vel: 3.34 m/s
MV Peak grad: 44.5 mmHg
S' Lateral: 5.3 cm
Weight: 3200 oz

## 2021-07-25 LAB — URINALYSIS, ROUTINE W REFLEX MICROSCOPIC
Bilirubin Urine: NEGATIVE
Glucose, UA: NEGATIVE mg/dL
Hgb urine dipstick: NEGATIVE
Ketones, ur: NEGATIVE mg/dL
Leukocytes,Ua: NEGATIVE
Nitrite: NEGATIVE
Protein, ur: NEGATIVE mg/dL
Specific Gravity, Urine: 1.018 (ref 1.005–1.030)
pH: 5 (ref 5.0–8.0)

## 2021-07-25 LAB — BASIC METABOLIC PANEL
Anion gap: 8 (ref 5–15)
BUN: 18 mg/dL (ref 8–23)
CO2: 23 mmol/L (ref 22–32)
Calcium: 7.8 mg/dL — ABNORMAL LOW (ref 8.9–10.3)
Chloride: 101 mmol/L (ref 98–111)
Creatinine, Ser: 1.28 mg/dL — ABNORMAL HIGH (ref 0.61–1.24)
GFR, Estimated: >60 mL/min (ref >60)
Glucose, Bld: 93 mg/dL (ref 70–99)
Potassium: 3.9 mmol/L (ref 3.5–5.1)
Sodium: 132 mmol/L — ABNORMAL LOW (ref 135–145)

## 2021-07-25 LAB — HEPARIN LEVEL (UNFRACTIONATED): Heparin Unfractionated: 0.99 IU/mL — ABNORMAL HIGH (ref 0.30–0.70)

## 2021-07-25 LAB — TROPONIN I (HIGH SENSITIVITY): Troponin I (High Sensitivity): 161 ng/L (ref <18)

## 2021-07-25 LAB — LIPID PANEL
Cholesterol: 171 mg/dL (ref 0–200)
HDL: 39 mg/dL — ABNORMAL LOW (ref >40)
LDL Cholesterol: 123 mg/dL — ABNORMAL HIGH (ref 0–99)
Total CHOL/HDL Ratio: 4.4 RATIO
Triglycerides: 47 mg/dL (ref <150)
VLDL: 9 mg/dL (ref 0–40)

## 2021-07-25 LAB — BRAIN NATRIURETIC PEPTIDE: B Natriuretic Peptide: 1111.1 pg/mL — ABNORMAL HIGH (ref 0.0–100.0)

## 2021-07-25 LAB — HIV ANTIBODY (ROUTINE TESTING W REFLEX): HIV Screen 4th Generation wRfx: NONREACTIVE

## 2021-07-25 IMAGING — MR MR ABDOMEN WO/W CM
4 of 6 series · 17 of 48 positions shown · IV contrast (Yes MH)
Comparison: No prior abdominal MRI.  Chest CTA [DATE].

CLINICAL DATA: 70-year-old male with history of renal mass.

EXAM:
MRI ABDOMEN WITHOUT AND WITH CONTRAST
TECHNIQUE: Multiplanar multisequence MR imaging of the abdomen was performed
both before and after the administration of intravenous contrast.
CONTRAST:  8mL GADAVIST GADOBUTROL 1 MMOL/ML IV SOLN, 8.4mL GADAVIST
GADOBUTROL 1 MMOL/ML IV SOLN

[Series 2: cor ssfse navigator · coronal · 6.0mm · 0.78mm/px · 6 of 39 slices shown]
[im 1/39]
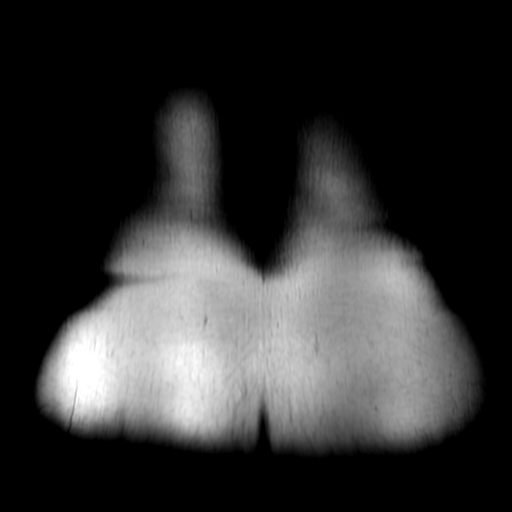
[im 8/39]
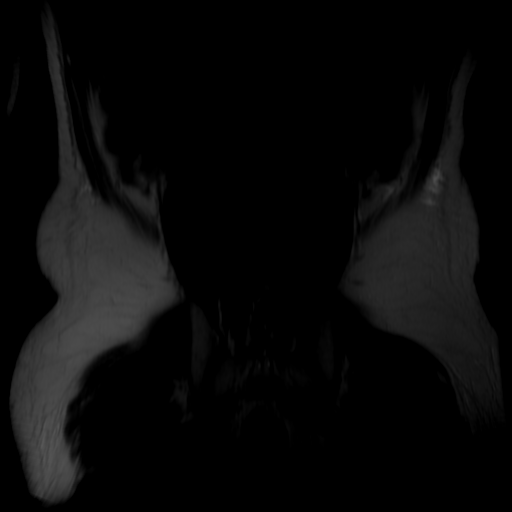
[im 16/39]
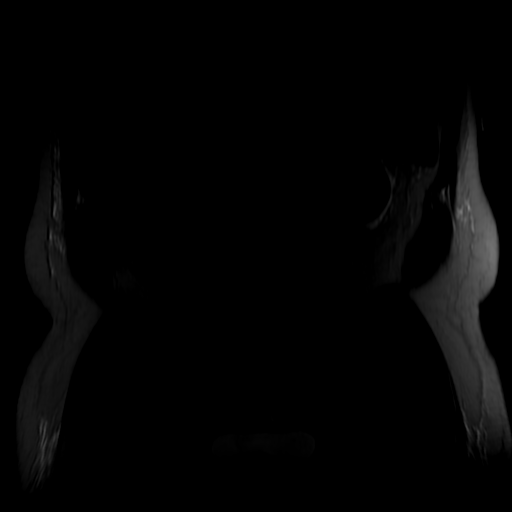
[im 23/39]
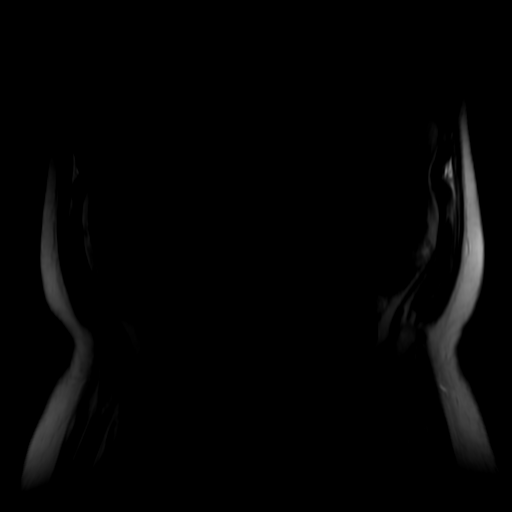
[im 31/39]
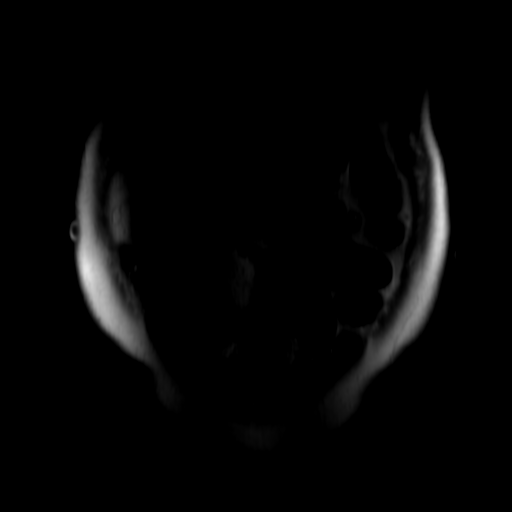
[im 39/39]
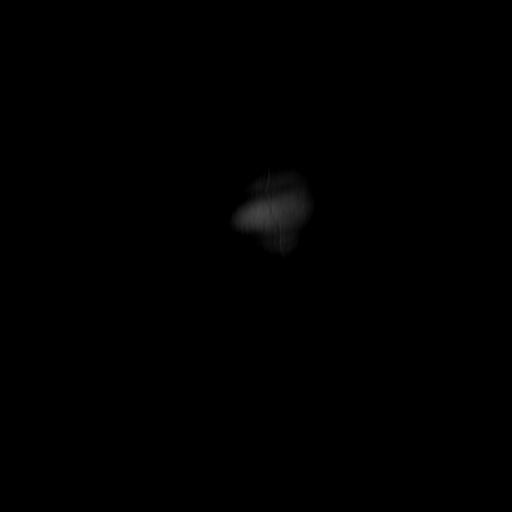

[Series 3: ax ssfse navigator · axial · 6.0mm · 0.74mm/px · z∈[-37,+175]mm · 5 of 33 slices shown]
[im 1/33]
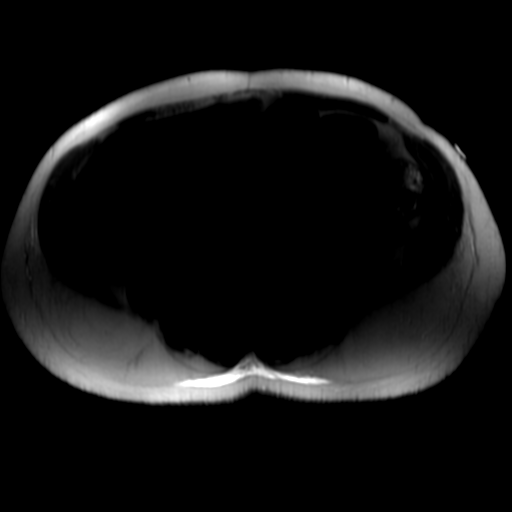
[im 7/33]
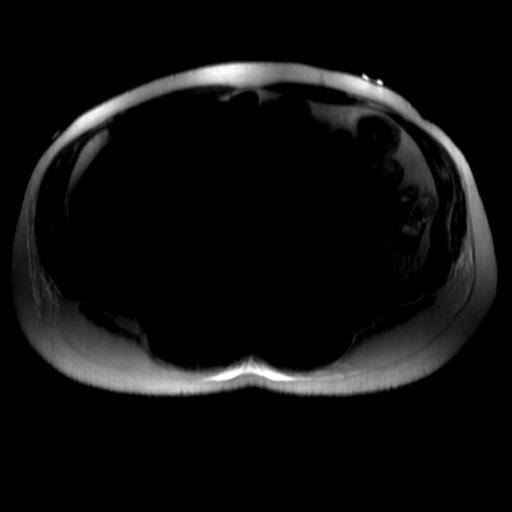
[im 13/33]
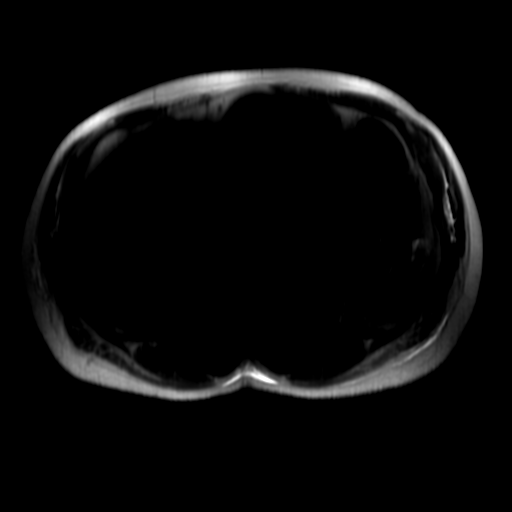
[im 20/33]
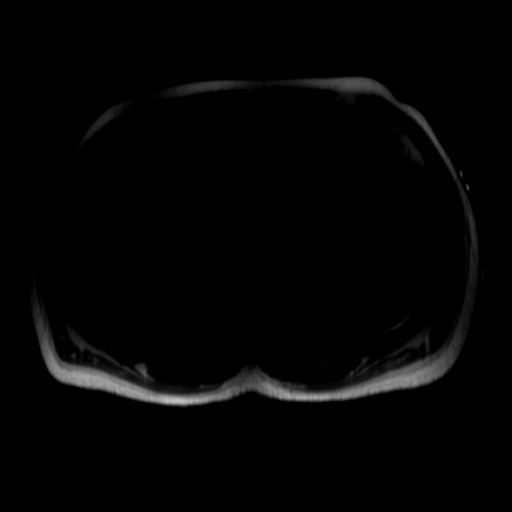
[im 33/33]
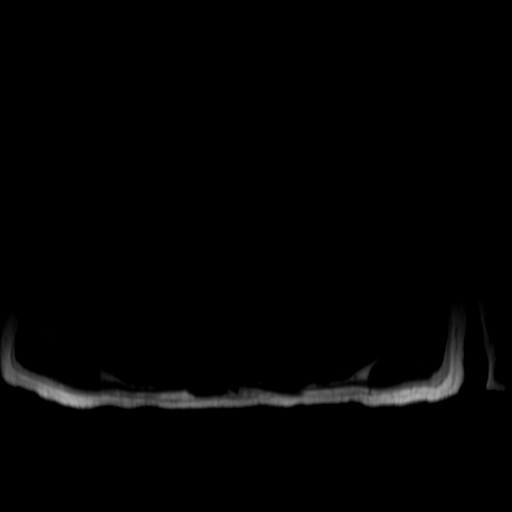

[Series 4: bSSFP fat-sat · axial · 5.0mm · 0.78mm/px · z∈[-6,+156]mm · 3 of 34 slices shown]
[im 7/34]
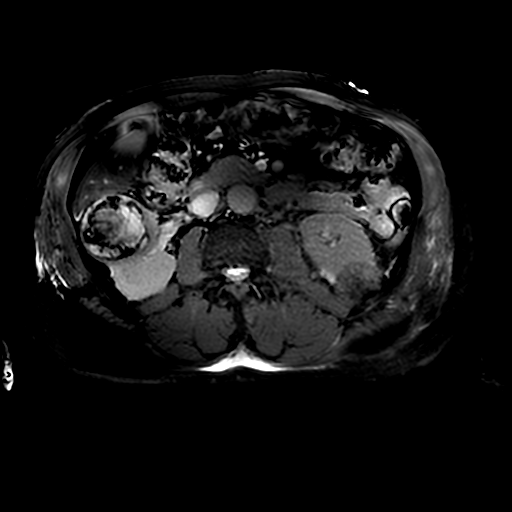
[im 20/34]
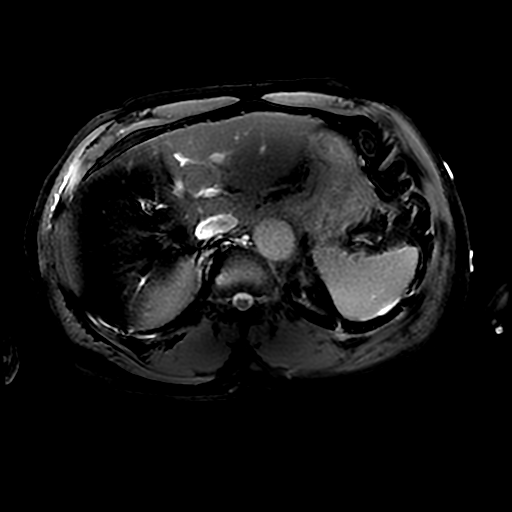
[im 34/34]
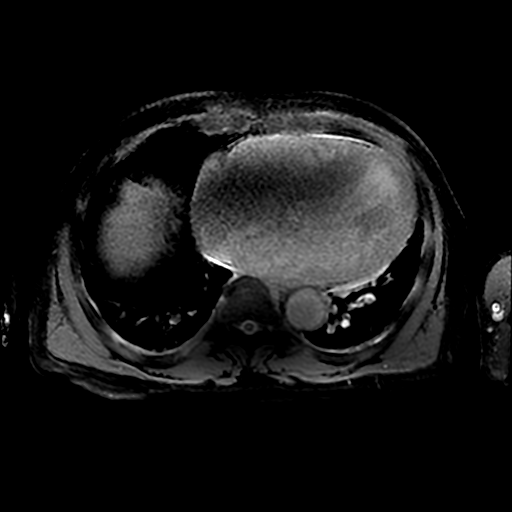

[Series 5: T1 dynamic · axial · non-contrast · 4.0mm · 0.82mm/px · z∈[-49,+135]mm · 3 of 60 slices shown]
[im 7/60]
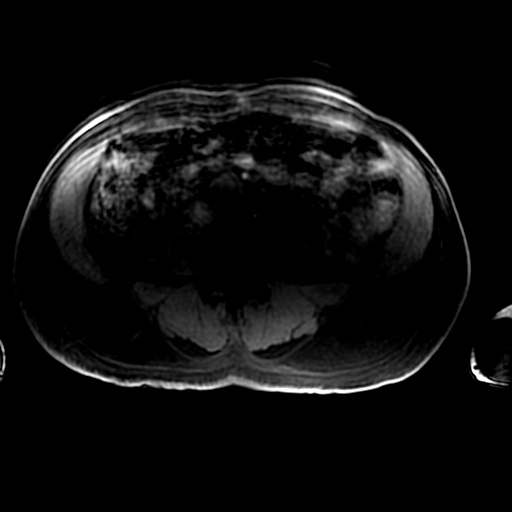
[im 33/60]
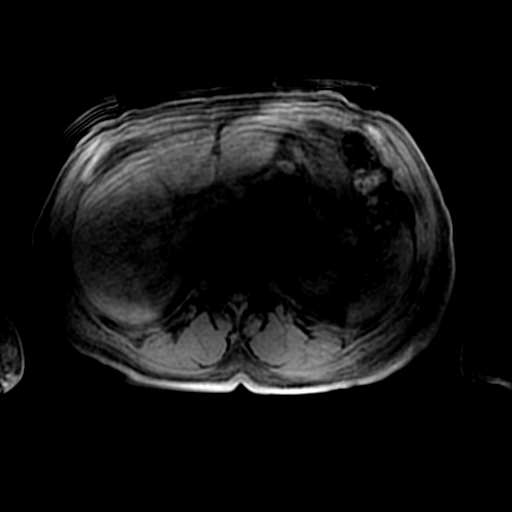
[im 53/60]
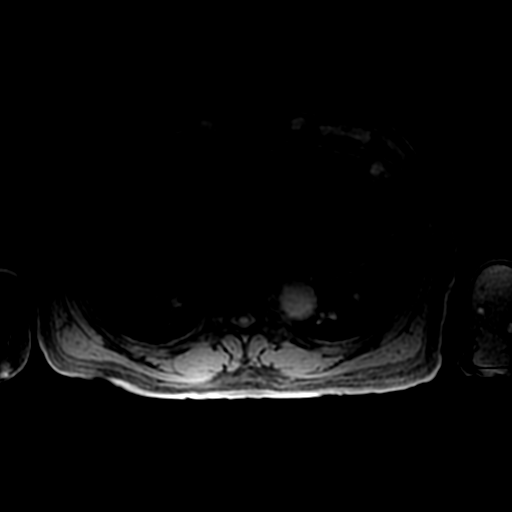

[17 of 48 positions shown; findings below may reference images not displayed]

FINDINGS: Comment: Portions of today's examination are severely limited by
large amount of patient respiratory motion.

Lower chest: Cardiomegaly with left ventricular dilatation, rounding
of the apex of the left ventricle, and focal area of increased T1
signal intensity in the apex of the left ventricle, corresponding to
a filling defect on post gadolinium images, indicative of
intracardiac thrombus.

Hepatobiliary: Liver has a slightly shrunken appearance and nodular
contour, indicative of underlying cirrhosis. No discrete cystic or
solid hepatic lesions. No intra or extrahepatic biliary ductal
dilatation. Gallbladder is normal in appearance.

Pancreas: No pancreatic mass. No pancreatic ductal dilatation. No
pancreatic or peripancreatic fluid collections or inflammatory
changes.

Spleen:  Unremarkable.

Adrenals/Urinary Tract: The lesions of concern in the kidneys
bilaterally are heterogeneous in signal intensity. The largest of
these lesions is in the lateral aspect of the interpolar region of
the right kidney (axial image 78 of series [G3]) measuring 5.6 x
cm, predominantly T1 hyperintense and T2 hypointense with no
definitive internal enhancement on post gadolinium imaging (although
subtraction imaging is severely limited by variable degrees of
inspiration). The smaller lesion in the posterior aspect of the
interpolar region of the left kidney measures 3.9 x 3.8 cm (axial
image 85 of series [G3]) and is more heterogeneous in T1 signal
intensity with some areas of T1 hyperintensity, predominantly T2
hypointense, with equivocal low-level enhancement on post gadolinium
imaging. Importantly, however, there is a smaller lesion in the
anterior aspect of the lower pole of the right kidney (axial image
92 of series [G3]) which measures 2.2 x 1.9 cm and is T1
hypointense, poorly visualized on T2 weighted images but
predominantly mildly T2 hypointense, and demonstrates some low-level
post gadolinium enhancement, concerning for neoplasm. These lesions
are all encapsulated within Gerota's fascia and are separated from
the renal veins which are widely patent bilaterally. No
hydroureteronephrosis. Bilateral adrenal glands are normal in
appearance.

Stomach/Bowel: Visualized portions are unremarkable.

Vascular/Lymphatic: Aortic atherosclerosis with fusiform aneurysmal
dilatation of the infrarenal abdominal aorta which measures up to 3
cm in diameter in the visualized portions of the abdomen. No
definite lymphadenopathy confidently identified in the abdomen.

Other: No significant volume of ascites noted in the visualized
portions of the peritoneal cavity.

Musculoskeletal: No aggressive appearing osseous lesions are noted
in the visualized portions of the skeleton.
IMPRESSION: 1. There are renal lesions bilaterally concerning for probable renal
cell carcinoma, favored to represent papillary renal cell
carcinomas, as discussed above. In addition, there is a larger
lesion in the lateral aspect of the interpolar region of the right
kidney which is favored to represent a large hemorrhagic cyst.
Urologic consultation is recommended.
2. Intracardiac thrombus in the apex of the left ventricle, which
places the patient at risk for systemic embolization. Consultation
with Cardiology for clinical management is recommended.
3. Cirrhosis.
4. Aortic atherosclerosis with fusiform aneurysmal dilatation of the
infrarenal abdominal aorta which measures up to 3.0 cm in diameter.

These results will be called to the ordering clinician or
representative by the Radiologist Assistant, and communication
documented in the PACS or [REDACTED].

## 2021-07-25 IMAGING — CT CT ANGIO CHEST
2 of 7 series · 16 of 36 positions shown · IV contrast (omnipaque)
Comparison: None.

CLINICAL DATA: Fever, fatigue.  Evaluate for pulmonary embolism.

EXAM:
CT ANGIOGRAPHY CHEST WITH CONTRAST
TECHNIQUE: Multidetector CT imaging of the chest was performed using the
standard protocol during bolus administration of intravenous
contrast. Multiplanar CT image reconstructions and MIPs were
obtained to evaluate the vascular anatomy.
CONTRAST:  100mL OMNIPAQUE IOHEXOL 350 MG/ML SOLN

[Series 9: pe thins · axial · 0.66mm/px · z∈[+1474,+1752]mm · 15 of 455 slices shown]
[im 29/455  lung]
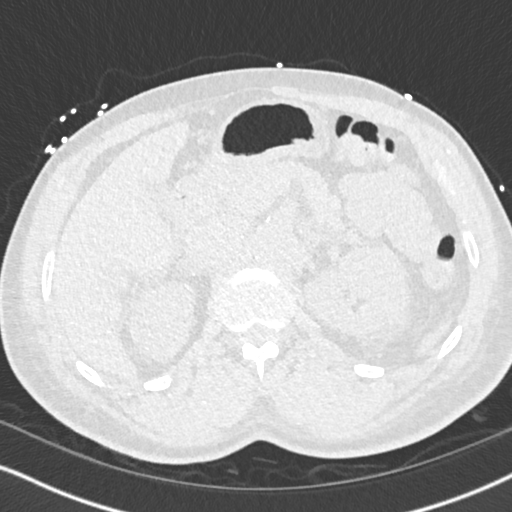
[im 57/455  mediastinal]
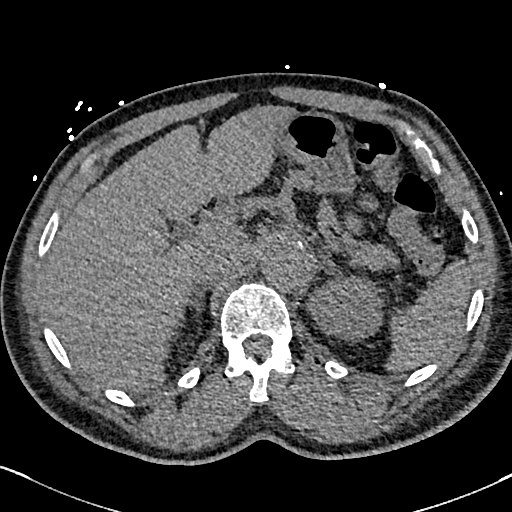
[im 86/455  lung]
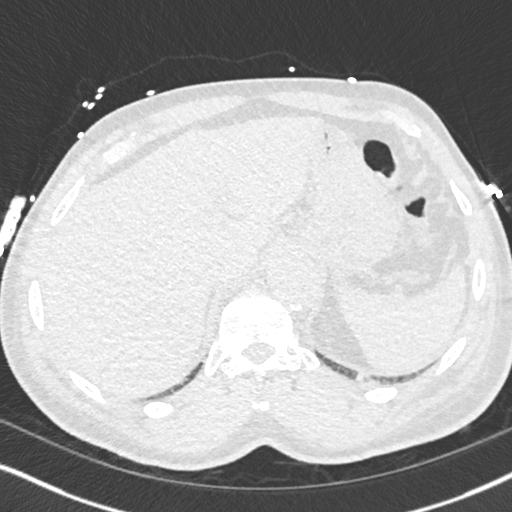
[im 114/455  mediastinal]
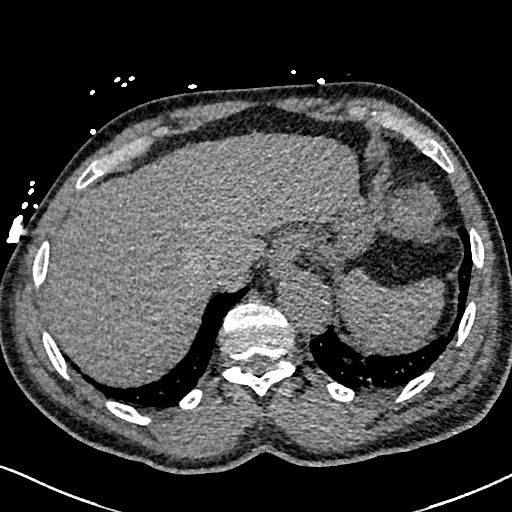
[im 142/455  lung]
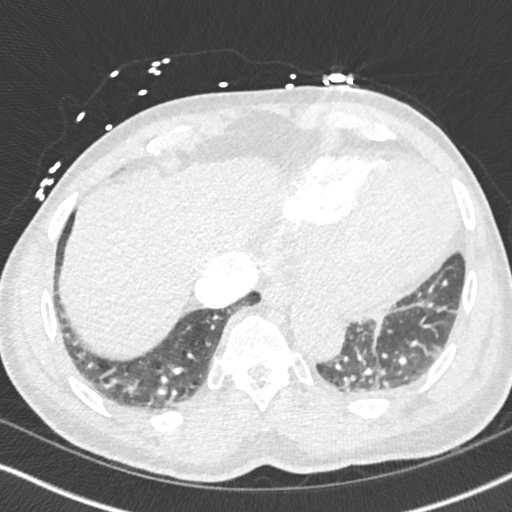
[im 171/455  mediastinal]
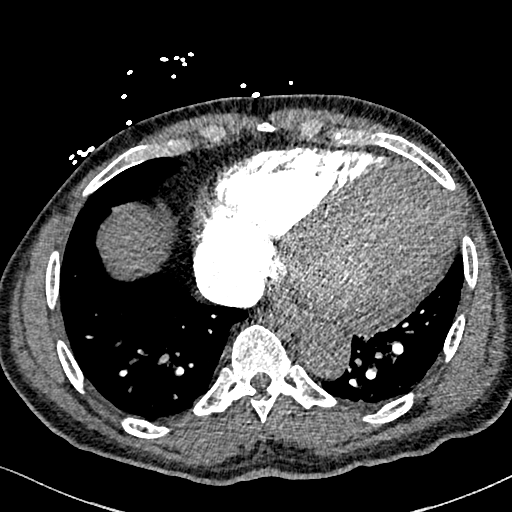
[im 199/455  lung]
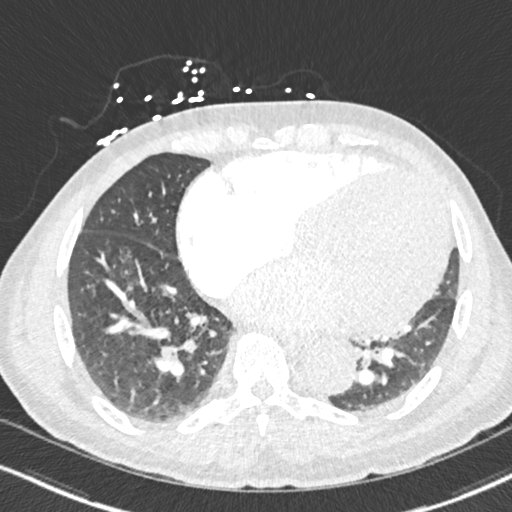
[im 228/455  mediastinal]
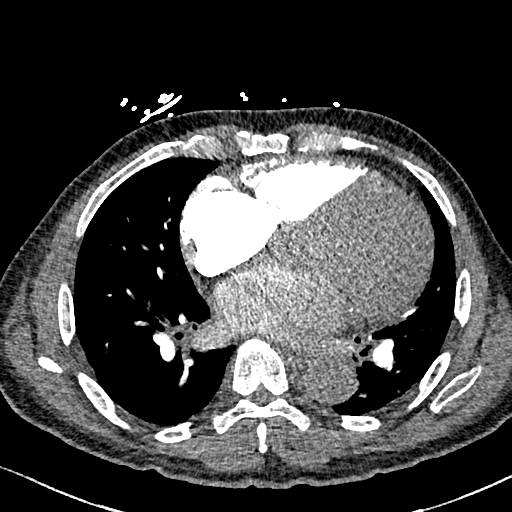
[im 256/455  lung]
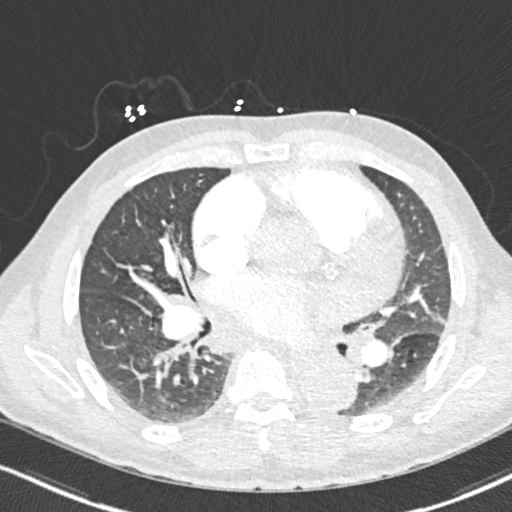
[im 284/455  mediastinal]
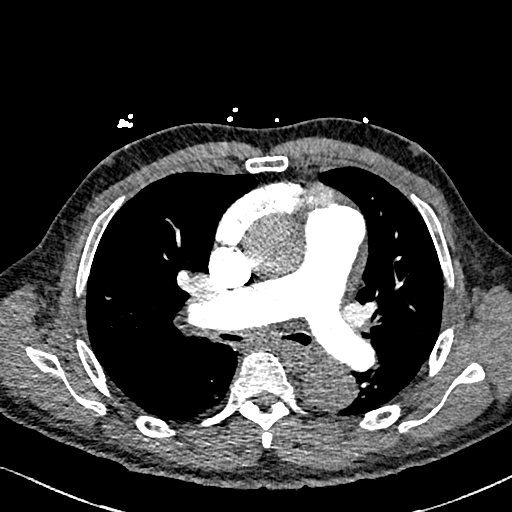
[im 313/455  lung]
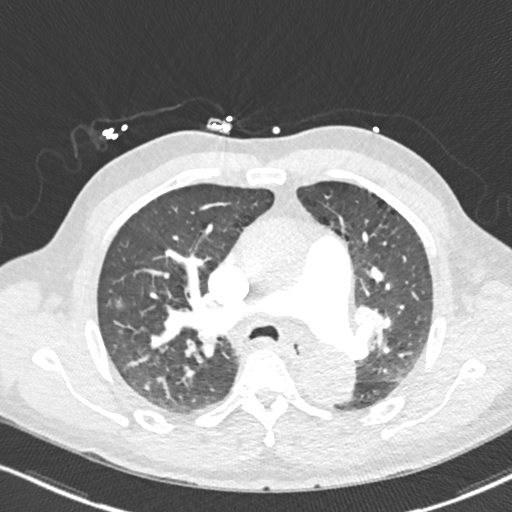
[im 341/455  mediastinal]
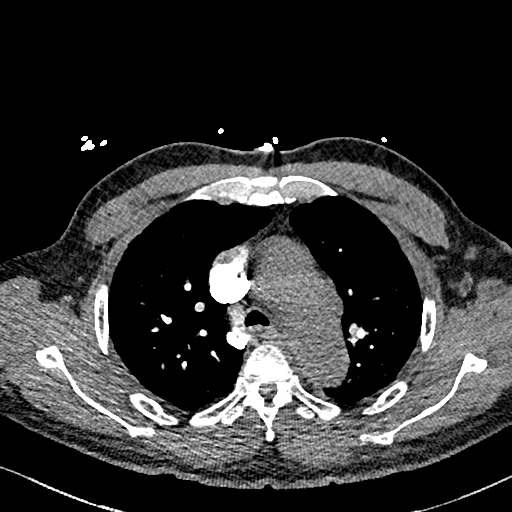
[im 369/455  lung]
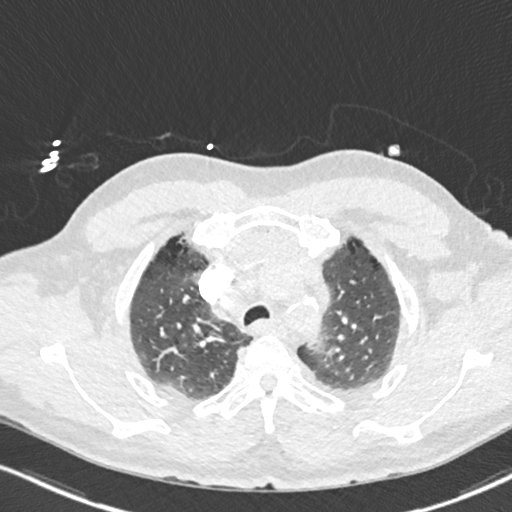
[im 398/455  mediastinal]
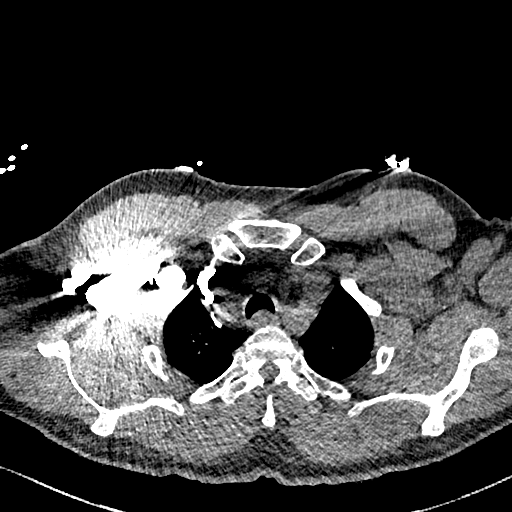
[im 426/455  lung]
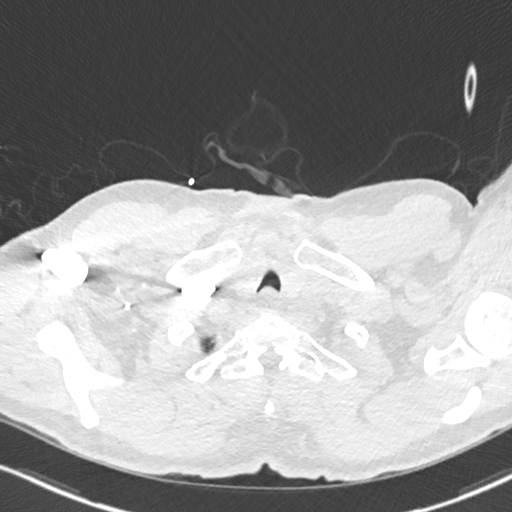

[Series 10: pe 2mm cor · coronal · 0.62mm/px · 1 of 126 slices shown]
[im 63/126  mediastinal]
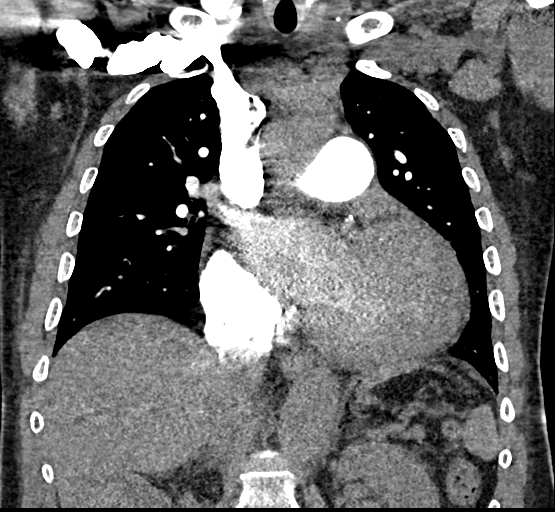

[16 of 36 positions shown; findings below may reference images not displayed]

FINDINGS: Vascular Findings:

There is adequate opacification of the pulmonary arterial system
with the main pulmonary artery measuring 419 Hounsfield units. There
are no discrete filling defects within the pulmonary arterial tree
to suggest pulmonary embolism. Borderline enlarged caliber the main
pulmonary artery measuring 33 mm in diameter.

Marked cardiomegaly, in particular, there is enlargement of the left
ventricle. Coronary artery calcifications. No pericardial effusion
though a small amount of fluid is seen within the pericardial
recess.

Mild fusiform aneurysmal dilatation of the ascending thoracic aorta
measuring 43 mm in diameter (image 55, series 7). No evidence of an
intramural hematoma.

Review of the MIP images confirms the above findings.

----------------------------------------------------------------------------------

Nonvascular Findings:

Mediastinum/Lymph Nodes: Evaluation for mediastinal and hilar
adenopathy is degraded secondary to tailoring the examination for
evaluation of the pulmonary arteries. Scattered mediastinal lymph
nodes are mildly prominent though not enlarged by size criteria with
index prevascular lymph node measuring 0.9 cm in greatest short axis
diameter (image 43, series 8). No definitive bulky mediastinal,
hilar or axillary lymphadenopathy.

Lungs/Pleura: Circumferential bronchial wall thickening, most
conspicuously involving the bilateral lower lobes. Scattered
ill-defined somewhat tree-in-bud ground-glass opacities are seen
with the right upper (images 50 and 55, series 8) and lower lobes
(image 77 and 86, series 8). No associated air bronchograms. Minimal
bibasilar ground-glass atelectasis. No pleural effusion or
pneumothorax.

Apical predominant centrilobular and paraseptal emphysematous
change.

There is a punctate (3 mm) pulmonary nodule within right middle lobe
(image 73, series 8).

Upper abdomen: Limited early arterial phase evaluation of the upper
abdomen demonstrates isoattenuating bilateral renal masses, the
right measuring 5.5 x 4.6 cm (image 129, series 7, and the left
measuring 4.8 x 4.1 cm (image 159, series 7).

Mild nodularity of the hepatic contour as could be seen in the
setting of cirrhotic change.

Musculoskeletal: No acute or aggressive osseous abnormalities.
Regional soft tissues appear normal. Normal appearance of the
thyroid gland.
IMPRESSION: 1. No evidence of pulmonary embolism.
2. Diffuse bronchial wall thickening with scattered ill-defined
somewhat tree-in-bud ground-glass opacities within the right upper
and lower lobes, nonspecific though could be seen in the setting of
early atypical infection superimposed on bronchitis/airways disease.
3. Bilateral isoattenuating renal masses, the right measuring 5.5 cm
and the left measuring 3.8 cm, incompletely evaluated on the present
examination, though worrisome for renal cell carcinomas. Further
evaluation with nonemergent renal protocol CT or MRI could be
performed as indicated.
4.  Emphysema ([SD]-[SD]).
5. Punctate (3 mm) right middle lobe pulmonary nodule. Comparison
with prior outside examinations is advised. Pending the results of
the above recommended dedicated renal imaging, a non-contrast chest
CT can be considered in 12 months given patient's emphysematous
change. This recommendation follows the consensus statement:
Guidelines for Management of Incidental Pulmonary Nodules Detected
[DATE].
6. Fusiform aneurysmal dilatation of the ascending thoracic aorta
measuring 43 mm in diameter. Recommend annual imaging followup by
CTA or MRA. This recommendation follows [SD]
ACCF/AHA/AATS/ACR/ASA/SCA/LAAOUINA/LAAOUINA/LAAOUINA/LAAOUINA Guidelines for the
Diagnosis and Management of Patients with Thoracic Aortic Disease.
Circulation. [SD]; 121: [id number]. Aortic aneurysm NOS ([SD]-[SD])
7. Marked cardiomegaly with enlargement of the caliber the main
pulmonary artery, nonspecific though could be seen in the setting
pulmonary arterial hypertension. Further evaluation with cardiac
echo could be performed as indicated.
8. Coronary artery calcifications. Aortic Atherosclerosis
([SD]-[SD]).
9. Mild nodularity hepatic contour as could be seen in the setting
of cirrhotic change. Correlation with LFTs is advised.

## 2021-07-25 MED ORDER — ACETAMINOPHEN 325 MG PO TABS: 650.0000 mg | ORAL_TABLET | Freq: Four times a day (QID) | ORAL | Status: DC | PRN

## 2021-07-25 MED ORDER — HEPARIN (PORCINE) 25000 UT/250ML-% IV SOLN
1100.0000 [IU]/h | INTRAVENOUS | Status: DC
  Administered 2021-07-25: 1450 [IU]/h via INTRAVENOUS
  Administered 2021-07-26: 01:00:00 1300 [IU]/h via INTRAVENOUS
  Administered 2021-07-27: 01:00:00 1100 [IU]/h via INTRAVENOUS
  Filled 2021-07-25 (×3): qty 250

## 2021-07-25 MED ORDER — IOHEXOL 350 MG/ML SOLN
100.0000 mL | Freq: Once | INTRAVENOUS | Status: AC | PRN
  Administered 2021-07-25: 100 mL via INTRAVENOUS

## 2021-07-25 MED ORDER — LACTATED RINGERS IV SOLN: INTRAVENOUS | Status: AC

## 2021-07-25 MED ORDER — CARVEDILOL 6.25 MG PO TABS
6.2500 mg | ORAL_TABLET | Freq: Two times a day (BID) | ORAL | Status: DC
  Administered 2021-07-26 – 2021-07-27 (×3): 6.25 mg via ORAL
  Filled 2021-07-25 (×3): qty 1

## 2021-07-25 MED ORDER — AMLODIPINE BESYLATE 10 MG PO TABS: 10.0000 mg | ORAL_TABLET | Freq: Every day | ORAL | Status: DC

## 2021-07-25 MED ORDER — HEPARIN BOLUS VIA INFUSION
2700.0000 [IU] | Freq: Once | INTRAVENOUS | Status: AC
  Administered 2021-07-25: 2700 [IU] via INTRAVENOUS
  Filled 2021-07-25: qty 2700

## 2021-07-25 MED ORDER — ENOXAPARIN SODIUM 100 MG/ML IJ SOSY
1.0000 mg/kg | PREFILLED_SYRINGE | Freq: Two times a day (BID) | INTRAMUSCULAR | Status: DC
  Administered 2021-07-25: 90 mg via SUBCUTANEOUS
  Filled 2021-07-25 (×2): qty 0.9

## 2021-07-25 MED ORDER — ACETAMINOPHEN 650 MG RE SUPP: 650.0000 mg | Freq: Four times a day (QID) | RECTAL | Status: DC | PRN

## 2021-07-25 MED ORDER — IPRATROPIUM-ALBUTEROL 0.5-2.5 (3) MG/3ML IN SOLN: 3.0000 mL | RESPIRATORY_TRACT | Status: DC

## 2021-07-25 MED ORDER — SENNOSIDES-DOCUSATE SODIUM 8.6-50 MG PO TABS: 1.0000 | ORAL_TABLET | Freq: Every evening | ORAL | Status: DC | PRN

## 2021-07-25 MED ORDER — OSELTAMIVIR PHOSPHATE 75 MG PO CAPS: 75.0000 mg | ORAL_CAPSULE | Freq: Two times a day (BID) | ORAL | Status: DC

## 2021-07-25 MED ORDER — ONDANSETRON HCL 4 MG PO TABS: 4.0000 mg | ORAL_TABLET | Freq: Four times a day (QID) | ORAL | Status: DC | PRN

## 2021-07-25 MED ORDER — ONDANSETRON HCL 4 MG/2ML IJ SOLN: 4.0000 mg | Freq: Four times a day (QID) | INTRAMUSCULAR | Status: DC | PRN

## 2021-07-25 MED ORDER — ASPIRIN EC 81 MG PO TBEC
81.0000 mg | DELAYED_RELEASE_TABLET | Freq: Every day | ORAL | Status: DC
  Administered 2021-07-26 – 2021-07-29 (×4): 81 mg via ORAL
  Filled 2021-07-25 (×4): qty 1

## 2021-07-25 MED ORDER — OSELTAMIVIR PHOSPHATE 30 MG PO CAPS
30.0000 mg | ORAL_CAPSULE | Freq: Two times a day (BID) | ORAL | Status: DC
  Administered 2021-07-25 – 2021-07-29 (×9): 30 mg via ORAL
  Filled 2021-07-25 (×10): qty 1

## 2021-07-25 MED ORDER — PERFLUTREN LIPID MICROSPHERE
1.0000 mL | INTRAVENOUS | Status: AC | PRN
  Administered 2021-07-25: 3 mL via INTRAVENOUS
  Filled 2021-07-25: qty 10

## 2021-07-25 MED ORDER — LOSARTAN POTASSIUM 50 MG PO TABS
50.0000 mg | ORAL_TABLET | Freq: Every day | ORAL | Status: DC
  Administered 2021-07-25 – 2021-07-28 (×4): 50 mg via ORAL
  Filled 2021-07-25 (×4): qty 1

## 2021-07-25 NOTE — Progress Notes (Signed)
2D echocardiogram with Definity completed.  07/25/2021 8:59 AM Kelby Aline., MHA, RVT, RDCS, RDMS

## 2021-07-25 NOTE — Progress Notes (Signed)
Progress Note  Patient Name: Troy May Date of Encounter: 07/25/2021  [facility] Cardiologist: None   Subjective   BP 145/110.  Creatinine 1.28, improved from 1.56 yesterday.  Troponin 159 > 161.  Denies any chest pain.   Inpatient Medications    Scheduled Meds:  amLODipine  10 mg Oral Daily   aspirin EC  81 mg Oral Daily   losartan  50 mg Oral Daily   oseltamivir  30 mg Oral BID   Continuous Infusions:  heparin 1,450 Units/hr (07/25/21 1053)   PRN Meds: acetaminophen **OR** acetaminophen, ondansetron **OR** ondansetron (ZOFRAN) IV, senna-docusate   Vital Signs    Vitals:   07/25/21 1015 07/25/21 1357 07/25/21 1411 07/25/21 1412  BP: (!) 140/107   (!) 145/110  Pulse: 65   65  Resp: 19   18  Temp:  98.8 F (37.1 C)  98 F (36.7 C)  TempSrc:  Oral  Oral  SpO2: 93%   97%  Weight:   85.6 kg   Height:   5\' 10"  (1.778 m)    No intake or output data in the 24 hours ending 07/25/21 1445 Last 3 Weights 07/25/2021 07/24/2021  Weight (lbs) 188 lb 11.4 oz 200 lb  Weight (kg) 85.6 kg 90.719 kg      Telemetry    NSR, NSVT x 8 beats- Personally Reviewed  ECG    Normal sinus rhythm, rate 83, PVCs, Q waves leads II, III, aVF and V4-6 - Personally Reviewed  Physical Exam   GEN: No acute distress.   Neck: No JVD Cardiac: RRR, no murmurs, rubs, or gallops.  Respiratory: Clear to auscultation bilaterally. GI: Soft, nontender MS: No edema Neuro:  Nonfocal  Psych: Normal affect   Labs    High Sensitivity Troponin:   Recent Labs  Lab 07/24/21 2021 07/24/21 2340  TROPONINIHS 159* 161*     Chemistry Recent Labs  Lab 07/24/21 2021 07/25/21 0248  NA 135 132*  K 4.4 3.9  CL 101 101  CO2 27 23  GLUCOSE 110* 93  BUN 19 18  CREATININE 1.56* 1.28*  CALCIUM 8.2* 7.8*  GFRNONAA 47* >60  ANIONGAP 7 8    Lipids  Recent Labs  Lab 07/25/21 0248  CHOL 171  TRIG 47  HDL 39*  LDLCALC 123*  CHOLHDL 4.4    Hematology Recent Labs  Lab 07/24/21 2021   WBC 5.4  RBC 4.89  HGB 15.6  HCT 47.2  MCV 96.5  MCH 31.9  MCHC 33.1  RDW 15.3  PLT 111*   Thyroid No results for input(s): TSH, FREET4 in the last 168 hours.  BNP Recent Labs  Lab 07/24/21 2021  BNP 1,111.1*    DDimer No results for input(s): DDIMER in the last 168 hours.   Radiology    DG Chest 2 View  Result Date: 07/24/2021 CLINICAL DATA:  Cough, congestion, sore throat. EXAM: CHEST - 2 VIEW COMPARISON:  06/28/2008. FINDINGS: The heart is enlarged. The mediastinal structures are within normal limits. No consolidation, effusion, or pneumothorax is seen. No acute osseous abnormality. IMPRESSION: Cardiomegaly with no acute process. Electronically Signed   By: Brett Fairy M.D.   On: 07/24/2021 21:14   CT Angio Chest Pulmonary Embolism (PE) W or WO Contrast  Result Date: 07/25/2021 CLINICAL DATA:  Fever, fatigue.  Evaluate for pulmonary embolism. EXAM: CT ANGIOGRAPHY CHEST WITH CONTRAST TECHNIQUE: Multidetector CT imaging of the chest was performed using the standard protocol during bolus administration of intravenous contrast. Multiplanar CT  image reconstructions and MIPs were obtained to evaluate the vascular anatomy. CONTRAST:  115mL OMNIPAQUE IOHEXOL 350 MG/ML SOLN COMPARISON:  None. FINDINGS: Vascular Findings: There is adequate opacification of the pulmonary arterial system with the main pulmonary artery measuring 419 Hounsfield units. There are no discrete filling defects within the pulmonary arterial tree to suggest pulmonary embolism. Borderline enlarged caliber the main pulmonary artery measuring 33 mm in diameter. Marked cardiomegaly, in particular, there is enlargement of the left ventricle. Coronary artery calcifications. No pericardial effusion though a small amount of fluid is seen within the pericardial recess. Mild fusiform aneurysmal dilatation of the ascending thoracic aorta measuring 43 mm in diameter (image 55, series 7). No evidence of an intramural hematoma.  Review of the MIP images confirms the above findings. ---------------------------------------------------------------------------------- Nonvascular Findings: Mediastinum/Lymph Nodes: Evaluation for mediastinal and hilar adenopathy is degraded secondary to tailoring the examination for evaluation of the pulmonary arteries. Scattered mediastinal lymph nodes are mildly prominent though not enlarged by size criteria with index prevascular lymph node measuring 0.9 cm in greatest short axis diameter (image 43, series 8). No definitive bulky mediastinal, hilar or axillary lymphadenopathy. Lungs/Pleura: Circumferential bronchial wall thickening, most conspicuously involving the bilateral lower lobes. Scattered ill-defined somewhat tree-in-bud ground-glass opacities are seen with the right upper (images 50 and 55, series 8) and lower lobes (image 77 and 86, series 8). No associated air bronchograms. Minimal bibasilar ground-glass atelectasis. No pleural effusion or pneumothorax. Apical predominant centrilobular and paraseptal emphysematous change. There is a punctate (3 mm) pulmonary nodule within right middle lobe (image 73, series 8). Upper abdomen: Limited early arterial phase evaluation of the upper abdomen demonstrates isoattenuating bilateral renal masses, the right measuring 5.5 x 4.6 cm (image 129, series 7, and the left measuring 4.8 x 4.1 cm (image 159, series 7). Mild nodularity of the hepatic contour as could be seen in the setting of cirrhotic change. Musculoskeletal: No acute or aggressive osseous abnormalities. Regional soft tissues appear normal. Normal appearance of the thyroid gland. IMPRESSION: 1. No evidence of pulmonary embolism. 2. Diffuse bronchial wall thickening with scattered ill-defined somewhat tree-in-bud ground-glass opacities within the right upper and lower lobes, nonspecific though could be seen in the setting of early atypical infection superimposed on bronchitis/airways disease. 3.  Bilateral isoattenuating renal masses, the right measuring 5.5 cm and the left measuring 3.8 cm, incompletely evaluated on the present examination, though worrisome for renal cell carcinomas. Further evaluation with nonemergent renal protocol CT or MRI could be performed as indicated. 4.  Emphysema (ICD10-J43.9). 5. Punctate (3 mm) right middle lobe pulmonary nodule. Comparison with prior outside examinations is advised. Pending the results of the above recommended dedicated renal imaging, a non-contrast chest CT can be considered in 12 months given patient's emphysematous change. This recommendation follows the consensus statement: Guidelines for Management of Incidental Pulmonary Nodules Detected on CT Images: From the Fleischner Society 2017; Radiology 2017; 284:228-243. 6. 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 Recommend annual imaging followup by CTA or MRA. This recommendation follows 2010 ACCF/AHA/AATS/ACR/ASA/SCA/SCAI/SIR/STS/SVM Guidelines for the Diagnosis and Management of Patients with Thoracic Aortic Disease. Circulation. 2010; 121: H829-H371. Aortic aneurysm NOS (ICD10-I71.9) 7. Marked cardiomegaly with enlargement of the caliber the main pulmonary artery, nonspecific though could be seen in the setting pulmonary arterial hypertension. Further evaluation with cardiac echo could be performed as indicated. 8. Coronary artery calcifications. Aortic Atherosclerosis (ICD10-I70.0). 9. Mild nodularity hepatic contour as could be seen in the setting of cirrhotic change. Correlation  with LFTs is advised. Electronically Signed   By: Sandi Mariscal M.D.   On: 07/25/2021 12:18   ECHOCARDIOGRAM COMPLETE  Result Date: 07/25/2021    ECHOCARDIOGRAM REPORT   Patient Name:   RODGER GIANGREGORIO Date of Exam: 07/25/2021 Medical Rec #:  [id number]  Height:       70.0 in Accession #:    [id number] Weight:       200.0 lb Date of Birth:  [birth year]  BSA:          2.087 m Patient  Age:    5 years   BP:           126/91 mmHg Patient Gender: M          HR:           76 bpm. Exam Location:  Inpatient Procedure: 2D Echo, Cardiac Doppler, Color Doppler and Intracardiac            Opacification Agent STAT ECHO Indications:    Acute ischemic heart disease, chest pain, elevated troponin  History:        Patient has no prior history of Echocardiogram examinations.                 Risk Factors:Hypertension.  Sonographer:    Maudry Mayhew MHA, RDMS, RVT, RDCS Referring Phys: [id number] [place]  1. Left ventricular ejection fraction, by estimation, is 25 to 30%. The left ventricle has severely decreased function. The left ventricle demonstrates regional wall motion abnormalities (see scoring diagram/findings for description). The left ventricular internal cavity size was mildly dilated. There is mild left ventricular hypertrophy. Left ventricular diastolic parameters are consistent with Grade II diastolic dysfunction (pseudonormalization). Elevated left atrial pressure.  2. Large LV apical thrombus measuring 2.5cm x 1.8cm  3. Right ventricular systolic function is moderately reduced. The right ventricular size is moderately enlarged. There is mildly elevated pulmonary artery systolic pressure. The estimated right ventricular systolic pressure is 32.4 mmHg.  4. Right atrial size was mildly dilated.  5. The mitral valve is normal in structure. Trivial mitral valve regurgitation.  6. The aortic valve is tricuspid. Aortic valve regurgitation is not visualized. No aortic stenosis is present.  7. The inferior vena cava is dilated in size with >50% respiratory variability, suggesting right atrial pressure of 8 mmHg. FINDINGS  Left Ventricle: Left ventricular ejection fraction, by estimation, is 25 to 30%. The left ventricle has severely decreased function. The left ventricle demonstrates regional wall motion abnormalities. Definity contrast agent was given IV to delineate the left  ventricular endocardial borders. The left ventricular internal cavity size was mildly dilated. There is mild left ventricular hypertrophy. Left ventricular diastolic parameters are consistent with Grade II diastolic dysfunction (pseudonormalization). Elevated left atrial pressure.  LV Wall Scoring: The mid and distal lateral wall, entire apex, entire inferior wall, and posterior wall are akinetic. The anterior wall, antero-lateral wall, anterior septum, mid inferoseptal segment, and basal inferoseptal segment are normal. Right Ventricle: The right ventricular size is moderately enlarged. Right vetricular wall thickness was not well visualized. Right ventricular systolic function is moderately reduced. There is mildly elevated pulmonary artery systolic pressure. The tricuspid regurgitant velocity is 2.73 m/s, and with an assumed right atrial pressure of 8 mmHg, the estimated right ventricular systolic pressure is 40.1 mmHg. Left Atrium: Left atrial size was normal in size. Right Atrium: Right atrial size was mildly dilated. Pericardium: There is no evidence of pericardial effusion. Mitral Valve: The mitral valve is normal in structure.  Trivial mitral valve regurgitation. Tricuspid Valve: The tricuspid valve is normal in structure. Tricuspid valve regurgitation is trivial. Aortic Valve: The aortic valve is tricuspid. Aortic valve regurgitation is not visualized. No aortic stenosis is present. Aortic valve mean gradient measures 1.0 mmHg. Aortic valve peak gradient measures 3.0 mmHg. Aortic valve area, by VTI measures 2.18 cm. Pulmonic Valve: The pulmonic valve was not well visualized. Pulmonic valve regurgitation is not visualized. Aorta: The aortic root is normal in size and structure. Venous: The inferior vena cava is dilated in size with greater than 50% respiratory variability, suggesting right atrial pressure of 8 mmHg. IAS/Shunts: The interatrial septum was not well visualized.  LEFT VENTRICLE PLAX 2D LVIDd:          5.80 cm   Diastology LVIDs:         5.30 cm   LV e' medial:    4.53 cm/s LV PW:         1.10 cm   LV E/e' medial:  15.6 LV IVS:        1.20 cm   LV e' lateral:   5.22 cm/s LVOT diam:     1.70 cm   LV E/e' lateral: 13.5 LV SV:         28 LV SV Index:   13 LVOT Area:     2.27 cm  RIGHT VENTRICLE RV S prime:     7.01 cm/s TAPSE (M-mode): 1.1 cm LEFT ATRIUM           Index        RIGHT ATRIUM           Index LA diam:      4.30 cm 2.06 cm/m   RA Area:     22.10 cm LA Vol (A2C): 58.0 ml 27.79 ml/m  RA Volume:   75.50 ml  36.17 ml/m LA Vol (A4C): 63.5 ml 30.42 ml/m  AORTIC VALVE AV Area (Vmax):    1.70 cm AV Area (Vmean):   1.74 cm AV Area (VTI):     2.18 cm AV Vmax:           86.10 cm/s AV Vmean:          50.400 cm/s AV VTI:            0.128 m AV Peak Grad:      3.0 mmHg AV Mean Grad:      1.0 mmHg LVOT Vmax:         64.30 cm/s LVOT Vmean:        38.600 cm/s LVOT VTI:          0.123 m LVOT/AV VTI ratio: 0.96  AORTA Ao Root diam: 3.20 cm MITRAL VALVE               TRICUSPID VALVE MV Area (PHT): 7.51 cm    TR Peak grad:   29.8 mmHg MV Decel Time: 101 msec    TR Vmax:        273.00 cm/s MR Peak grad: 44.5 mmHg MR Vmax:      333.50 cm/s  SHUNTS MV E velocity: 70.70 cm/s  Systemic VTI:  0.12 m MV A velocity: 46.30 cm/s  Systemic Diam: 1.70 cm MV E/A ratio:  1.53 Oswaldo Milian MD Electronically signed by Oswaldo Milian MD Signature Date/Time: 07/25/2021/12:12:47 PM    Final     Cardiac Studies   Echo 07/25/21:  1. Left ventricular ejection fraction, by estimation, is 25 to 30%. The  left ventricle has  severely decreased function. The left ventricle  demonstrates regional wall motion abnormalities (see scoring  diagram/findings for description). The left  ventricular internal cavity size was mildly dilated. There is mild left  ventricular hypertrophy. Left ventricular diastolic parameters are  consistent with Grade II diastolic dysfunction (pseudonormalization).  Elevated left atrial  pressure.   2. Large LV apical thrombus measuring 2.5cm x 1.8cm   3. Right ventricular systolic function is moderately reduced. The right  ventricular size is moderately enlarged. There is mildly elevated  pulmonary artery systolic pressure. The estimated right ventricular  systolic pressure is 62.5 mmHg.   4. Right atrial size was mildly dilated.   5. The mitral valve is normal in structure. Trivial mitral valve  regurgitation.   6. The aortic valve is tricuspid. Aortic valve regurgitation is not  visualized. No aortic stenosis is present.   7. The inferior vena cava is dilated in size with >50% respiratory  variability, suggesting right atrial pressure of 8 mmHg.   Patient Profile     70 y.o. male with no known medical history who presents with fevers, chills, shortness of breath, chest pain  Assessment & Plan    Acute combined systolic and diastolic heart failure: Echo shows EF 25 to 63%, grade 2 diastolic dysfunction, large LV apical thrombus, moderate RV dysfunction.  New diagnosis.  Regional wall motion abnormalities (inferior/inferolateral akinesis and apical akinesis) suggest multivessel CAD.  CTPA showed no PE but did note bilateral renal masses worrisome for renal cell carcinomas.  Appears euvolemic -Recommend LHC/RHC, likely plan for Monday.  LV apical thrombus: Start heparin drip  Influenza A infection: Treatment per primary team.  AKI: Creatinine 1.56 yesterday, improved to 1.28 with IV fluids.  Would be cautious about giving more IV fluids given his low EF.  Possible RCC: noted on CT chest.  Will d/w Dr [place] Crane, will need to determine if RCC prior to any coronary stenting   For questions or updates, please contact [facility] Please consult [email or web address] for contact info under        Signed, Donato Heinz, MD  07/25/2021, 2:45 PM

## 2021-07-25 NOTE — Progress Notes (Signed)
  Echo with apical thrombus, we added IV heparin per pharmacy.    Cecilie Kicks, FNP-C [facility]  [phone] or after 5pm and on weekends call [phone] 07/25/2021

## 2021-07-25 NOTE — Progress Notes (Signed)
ANTICOAGULATION CONSULT NOTE - Initial Consult  Pharmacy Consult for heparin Indication: apical thrombus on ECHO  No Known Allergies  Patient Measurements: Height: 5\' 10"  (177.8 cm) Weight: 90.7 kg (200 lb) IBW/kg (Calculated) : 73 Heparin Dosing Weight: 90.7 kg  Vital Signs: Temp: 98.3 F (36.8 C) (11/26 0939) Temp Source: Oral (11/26 0939) BP: 126/91 (11/26 0715) Pulse Rate: 64 (11/26 0715)  Labs: Recent Labs    07/24/21 2021 07/24/21 2340 07/25/21 0248  HGB 15.6  --   --   HCT 47.2  --   --   PLT 111*  --   --   CREATININE 1.56*  --  1.28*  TROPONINIHS 159* 161*  --     Estimated Creatinine Clearance: 60.8 mL/min (A) (by C-G formula based on SCr of 1.28 mg/dL (H)).   Medical History: History reviewed. No pertinent past medical history.  Medications:  see MAR  Assessment: 70 yo M with new apical thrombus on ECHO. Was on lovenox 90mg  BID (treatment dosing) started by hospitalist team for NSTEMI. Last lovenox dose this AM @ 0224. Cardiology consulted - possible PCI.  Goal of Therapy:  Heparin level 0.3-0.7 units/ml Monitor platelets by anticoagulation protocol: Yes   Plan:  Give 2700 (30units/kg) units bolus x 1 Start heparin infusion at 1450 units/hr Check anti-Xa level in 6 hours and daily while on heparin Continue to monitor H&H and platelets  Joetta Manners, PharmD, [facility] Emergency Medicine Clinical Pharmacist ED RPh Phone: [place]: [phone]

## 2021-07-25 NOTE — Progress Notes (Signed)
PHARMACY NOTE:  ANTIMICROBIAL RENAL DOSAGE ADJUSTMENT  Current antimicrobial regimen includes a mismatch between antimicrobial dosage and estimated renal function.  As per policy approved by the Pharmacy & Therapeutics and Medical Executive Committees, the antimicrobial dosage will be adjusted accordingly.  Current antimicrobial dosage:  Oseltamavir 75mg  po BID  Indication: Influenza A  Renal Function:  Estimated Creatinine Clearance: 49.9 mL/min (A) (by C-G formula based on SCr of 1.56 mg/dL (H)).    Antimicrobial dosage has been changed to:  Oseltamavir 30mg  po bID   Thank you for allowing pharmacy to be a part of this patient's care.  Sherlon Handing, PharmD, BCPS Please see amion for complete clinical pharmacist phone list 07/25/2021 12:55 AM

## 2021-07-25 NOTE — H&P (Signed)
History and Physical    Troy May DEY:[id number] DOB: August 30, 1951 DOA: 07/24/2021  PCP: Pcp, No   Patient coming from: Home  Chief Complaint: Chest pain, shortness of breath, cough, fever  HPI: Troy May is a 70 y.o. male with medical history significant for no chronic medical problems who presents for evaluation of fever, chills, shortness of breath and cough for the last 2 days.  He reports that this afternoon he also developed chest pain that he described as a sharp substernal pain with periods of chest pressure in between the sharp pain.  Chest pain was worsened with coughing.  He reports having increased shortness of breath with exertion which is not normal for him.  States he has not been to a doctor in the past few decades and has no known chronic medical problems.  He does not take any chronic medications.  Reports the chest pain did not radiate.  He has had shortness of breath and nausea.  States he has had a fever to 101 degrees for the last 2 days that improved with Motrin yesterday.  States he has a dry nonproductive cough.  He has not had any prolonged immobilization or prolonged travel.  He has never had DVT or PE.  He has never had cardiac problems or cardiac work-up in the past He has a history of smoking but states he quit a month ago.  States he would smoke 1-2 mini cigars a day.  Denies alcohol or illicit drug use.  ED Course: In the emergency room he has had elevated blood pressures in the 150-170/100-120 range.  T-max of 100.5 degrees.  EKG was obtained which showed nonspecific ST changes in the anterior lateral leads.  Troponin was elevated at 159.  BNP elevated at 1111.  X-ray showed cardiomegaly with no pulmonary edema, infiltrate or consolidation.  CBC is unremarkable.  Sodium 135 potassium 4.4 chloride 101 bicarb 27 creatinine 1.56 BUN 19 glucose 110.  Influenza A positive.  COVID-negative.  Influenza B negative.  Hospitalist service asked to evaluate and manage patient  overnight.  ER physician is consulting cardiology as well  Review of Systems:  General: Reports fever. Denies weight loss, night sweats.  Denies dizziness.  Denies change in appetite HENT: Denies head trauma, headache, denies change in hearing, tinnitus.  Denies nasal congestion or bleeding.  Denies sore throat, sores in mouth.  Denies difficulty swallowing Eyes: Denies blurry vision, pain in eye, drainage.  Denies discoloration of eyes. Neck: Denies pain.  Denies swelling.  Denies pain with movement. Cardiovascular: Reports chest pain. Denies palpitations.  Denies edema.  Denies orthopnea Respiratory: Reports shortness of breath, cough.  Denies wheezing.  Denies sputum production Gastrointestinal: Denies abdominal pain, swelling.  Denies nausea, vomiting, diarrhea.  Denies melena.  Denies hematemesis. Musculoskeletal: Denies limitation of movement.  Denies deformity or swelling. Denies arthralgias or myalgias. Genitourinary: Denies pelvic pain.  Denies urinary frequency or hesitancy.  Denies dysuria.  Skin: Denies rash.  Denies petechiae, purpura, ecchymosis. Neurological: Denies syncope.  Denies seizure activity.  Denies weakness or paresthesia.  Denies slurred speech, drooping face.  Denies visual change. Psychiatric: Denies depression, anxiety.  Denies hallucinations.  History reviewed. No pertinent past medical history.  History reviewed. No pertinent surgical history.  Social History  reports that he has been smoking cigarettes. He does not have any smokeless tobacco history on file. No history on file for alcohol use and drug use.  No Known Allergies  History reviewed. No pertinent family history.   Prior  to Admission medications   Medication Sig Start Date End Date Taking? Authorizing Provider  cetirizine (ZYRTEC) 10 MG tablet Take 10 mg by mouth daily as needed for allergies.   Yes [provider]  ibuprofen (ADVIL) 200 MG tablet Take 600 mg by mouth every 6 (six)  hours as needed for fever or headache.   Yes [provider]  OVER THE COUNTER MEDICATION Take 1 tablet by mouth daily. gummy   Yes [provider]    Physical Exam: Vitals:   07/24/21 2230 07/24/21 2245 07/24/21 2315 07/24/21 2330  BP: (!) 155/112 (!) 163/120 (!) 167/111 (!) 158/116  Pulse: 80 81 77 72  Resp: (!) 36 (!) 29 20 (!) 28  Temp:      TempSrc:      SpO2: 95% 94% 98% 100%  Weight:      Height:        Constitutional: NAD, calm, comfortable Vitals:   07/24/21 2230 07/24/21 2245 07/24/21 2315 07/24/21 2330  BP: (!) 155/112 (!) 163/120 (!) 167/111 (!) 158/116  Pulse: 80 81 77 72  Resp: (!) 36 (!) 29 20 (!) 28  Temp:      TempSrc:      SpO2: 95% 94% 98% 100%  Weight:      Height:       General: WDWN, Alert and oriented x3.  Eyes: EOMI, PERRL, conjunctivae normal.  Sclera nonicteric HENT:  [place]/AT, external ears normal.  Nares patent without epistasis.  Mucous membranes are moist.  Neck: Soft, normal range of motion, supple, no masses, no thyromegaly. Trachea midline Respiratory: clear to auscultation bilaterally, no wheezing, no crackles. Normal respiratory effort. No accessory muscle use.  Cardiovascular: Regular rate and rhythm, no murmurs / rubs / gallops. No extremity edema. 2+ pedal pulses.  Abdomen: Soft, no tenderness, nondistended, no rebound or guarding.  No masses palpated. Bowel sounds normoactive Musculoskeletal: FROM. No cyanosis. No joint deformity upper and lower extremities. Normal muscle tone.  Skin: Warm, dry, intact no rashes, lesions, ulcers. No induration Neurologic: CN 2-12 grossly intact.  Normal speech.  Sensation intact to touch. Strength 5/5 in all extremities.   Psychiatric: Normal judgment and insight.  Normal mood.    Labs on Admission: I have personally reviewed following labs and imaging studies  CBC: Recent Labs  Lab 07/24/21 2021  WBC 5.4  NEUTROABS 3.3  HGB 15.6  HCT 47.2  MCV 96.5  PLT 111*    Basic  Metabolic Panel: Recent Labs  Lab 07/24/21 2021  NA 135  K 4.4  CL 101  CO2 27  GLUCOSE 110*  BUN 19  CREATININE 1.56*  CALCIUM 8.2*    GFR: Estimated Creatinine Clearance: 49.9 mL/min (A) (by C-G formula based on SCr of 1.56 mg/dL (H)).  Liver Function Tests: No results for input(s): AST, ALT, ALKPHOS, BILITOT, PROT, ALBUMIN in the last 168 hours.  Urine analysis: No results found for: COLORURINE, APPEARANCEUR, LABSPEC, [place], GLUCOSEU, [place], BILIRUBINUR, KETONESUR, PROTEINUR, UROBILINOGEN, NITRITE, LEUKOCYTESUR  Radiological Exams on Admission: DG Chest 2 View  Result Date: 07/24/2021 CLINICAL DATA:  Cough, congestion, sore throat. EXAM: CHEST - 2 VIEW COMPARISON:  06/28/2008. FINDINGS: The heart is enlarged. The mediastinal structures are within normal limits. No consolidation, effusion, or pneumothorax is seen. No acute osseous abnormality. IMPRESSION: Cardiomegaly with no acute process. Electronically Signed   By: Brett Fairy M.D.   On: 07/24/2021 21:14    EKG: Independently reviewed.  EKG shows normal sinus rhythm with nonspecific anterior lateral  ST changes.  QTC is 445  Assessment/Plan Principal Problem:   NSTEMI (non-ST elevated myocardial infarction)  Mr. Jian is placed on cardiac telemetry floor for observation Check serial troponin levels.  Cardiology consulted by Er physician.  Lovenox bid for anticoagulation ASA 81 mg a day Check lipid panel Stress test in am unless cardiology decides to perform cardiac catherization.  Active Problems:   Essential hypertension Patient is started on Norvasc and Cozaar.  Monitor blood pressure.  Patient was not taking any anticoagulant medications at home prior to tonight    Influenza A Started on Tamiflu 75 mg twice daily for 5 days Maintain adequate hydration Patient does not require oxygen at this time    AKI (acute kidney injury) IV fluid hydration with LR at 100 ml/hr overnight Recheck electrolytes and  renal function in am   DVT prophylaxis: Placed on Lovenox 1 mg/kg bid for NSTEMI  Code Status:   Full Code  Family Communication:  Diagnosis plan is discussed with patient his wife is at bedside.  They verbalized understanding agree with plan.  Questions were answered.  Further recommendations to follow as clinical indicated Disposition Plan:   Patient is from:  Home  Anticipated DC to:  Home  Anticipated DC date:  Anticipate less than 2 midnight stay in the hospital  Consults called:  Cardiology consulted by ER physician  Admission status:  Observation  Yevonne Aline Senovia Gauer MD [facility]  How to contact the [facility] Attending or Consulting provider [place] or covering provider during after hours [place], for this patient?   Check the care team in [facility] and look for a) attending/consulting [facility] provider listed and b) the [facility] team listed Log into [email or web address] and use [place]'s universal password to access. If you do not have the password, please contact the hospital operator. Locate the [facility] provider you are looking for under [facility] and page to a number that you can be directly reached. If you still have difficulty reaching the provider, please page the [facility] (Director on Call) for the Hospitalists listed on amion for assistance.  07/25/2021, 12:25 AM

## 2021-07-25 NOTE — Progress Notes (Signed)
ANTICOAGULATION CONSULT NOTE - Initial Consult  Pharmacy Consult for heparin Indication: apical thrombus on ECHO  No Known Allergies  Patient Measurements: Height: 5\' 10"  (177.8 cm) Weight: 85.6 kg (188 lb 11.4 oz) IBW/kg (Calculated) : 73 Heparin Dosing Weight: 90.7 kg  Vital Signs: Temp: 98.1 F (36.7 C) (11/26 1617) Temp Source: Oral (11/26 1617) BP: 154/102 (11/26 1617) Pulse Rate: 77 (11/26 1617)  Labs: Recent Labs    07/24/21 2021 07/24/21 2340 07/25/21 0248 07/25/21 1622  HGB 15.6  --   --   --   HCT 47.2  --   --   --   PLT 111*  --   --   --   HEPARINUNFRC  --   --   --  0.99*  CREATININE 1.56*  --  1.28*  --   TROPONINIHS 159* 161*  --   --      Estimated Creatinine Clearance: 55.4 mL/min (A) (by C-G formula based on SCr of 1.28 mg/dL (H)).   Medical History: History reviewed. No pertinent past medical history.  Assessment:  70 yo M with new apical thrombus on ECHO. Was on lovenox 90mg  BID (treatment dosing) started by hospitalist team for NSTEMI. Last lovenox dose this AM @ 0224. Cardiology consulted - possible PCI. Heparin drip 1450 uts/hr HL 0.99 - could still have some enoxaparin affecting heparin level - will drop rate but watch carefully   Goal of Therapy:  Heparin level 0.3-0.7 units/ml Monitor platelets by anticoagulation protocol: Yes   Plan:  Decrease heparin drop 1300 uts/hr  Daily heparin level and CBC    Bonnita Nasuti Pharm.D. CPP, BCPS Clinical Pharmacist [phone] 07/25/2021 8:29 PM

## 2021-07-25 NOTE — ED Provider Notes (Signed)
[facility]    CSN: [id number] Arrival date & time: 07/24/21  1750      History   Chief Complaint No chief complaint on file.   HPI Troy May is a 70 y.o. male.   Patient here today for evaluation of fever, fatigue, elevated blood pressure and mild disorientation that has been ongoing for the last few days.  Family states disorientation started yesterday afternoon.  They noted yesterday that they checked his blood pressure and it was very high which was concerning.  He does endorse some chest pain but states this may be worse with cough.  The history is provided by the patient.   History reviewed. No pertinent past medical history.  Patient Active Problem List   Diagnosis Date Noted   NSTEMI (non-ST elevated myocardial infarction) ([place]) 07/25/2021   Influenza A 07/25/2021   Essential hypertension 07/25/2021   AKI (acute kidney injury) ([place]) 07/25/2021    History reviewed. No pertinent surgical history.     Home Medications    Prior to Admission medications   Medication Sig Start Date End Date Taking? Authorizing Provider  cetirizine (ZYRTEC) 10 MG tablet Take 10 mg by mouth daily as needed for allergies.    [provider]  ibuprofen (ADVIL) 200 MG tablet Take 600 mg by mouth every 6 (six) hours as needed for fever or headache.    [provider]  OVER THE COUNTER MEDICATION Take 1 tablet by mouth daily. gummy    [provider]    Family History History reviewed. No pertinent family history.  Social History Social History   Tobacco Use   Smoking status: Some Days    Types: Cigarettes     Allergies   Patient has no known allergies.   Review of Systems Review of Systems  Constitutional:  Positive for fatigue and fever. Negative for chills.  HENT:  Positive for congestion.   Eyes:  Negative for discharge and redness.  Respiratory:  Positive for cough. Negative for shortness of breath.   Gastrointestinal:   Negative for vomiting.  Skin:  Positive for color change and wound.  Neurological:  Negative for numbness.    Physical Exam Triage Vital Signs ED Triage Vitals  Enc Vitals Group     BP 07/24/21 1807 (!) 182/130     Pulse Rate 07/24/21 1807 92     Resp 07/24/21 1807 16     Temp 07/24/21 1807 98.6 F (37 C)     Temp Source 07/24/21 1807 Oral     SpO2 07/24/21 1807 94 %     Weight --      Height --      Head Circumference --      Peak Flow --      Pain Score 07/24/21 1808 7     Pain Loc --      Pain Edu? --      Excl. in [place]? --    No data found.  Updated Vital Signs BP (!) 182/130 (BP Location: Left Arm)   Pulse 92   Temp 98.6 F (37 C) (Oral)   Resp 16   SpO2 94%      Physical Exam Vitals and nursing note reviewed.  Constitutional:      General: He is not in acute distress.    Appearance: Normal appearance. He is not ill-appearing.  HENT:     Head: Normocephalic and atraumatic.  Eyes:     Conjunctiva/sclera: Conjunctivae normal.  Cardiovascular:  Rate and Rhythm: Normal rate and regular rhythm.  Pulmonary:     Effort: Pulmonary effort is normal.  Neurological:     Mental Status: He is alert.  Psychiatric:        Mood and Affect: Mood normal.        Behavior: Behavior normal.        Thought Content: Thought content normal.     UC Treatments / Results  Labs (all labs ordered are listed, but only abnormal results are displayed) Labs Reviewed  POCT FASTING CBG [facility] MANUAL ENTRY - Abnormal; Notable for the following components:      Result Value   POCT Glucose ([facility]) 117 (*)    All other components within normal limits    EKG   Radiology DG Chest 2 View  Result Date: 07/24/2021 CLINICAL DATA:  Cough, congestion, sore throat. EXAM: CHEST - 2 VIEW COMPARISON:  06/28/2008. FINDINGS: The heart is enlarged. The mediastinal structures are within normal limits. No consolidation, effusion, or pneumothorax is seen. No acute osseous abnormality. IMPRESSION:  Cardiomegaly with no acute process. Electronically Signed   By: Brett Fairy M.D.   On: 07/24/2021 21:14    Procedures Procedures (including critical care time)  Medications Ordered in UC Medications - No data to display  Initial Impression / Assessment and Plan / UC Course  I have reviewed the triage vital signs and the nursing notes.  Pertinent labs & imaging results that were available during my care of the patient were reviewed by me and considered in my medical decision making (see chart for details).    Given significantly elevated blood pressure with other concerning symptoms recommended further evaluation in the ED as I suspect he will need stat labs and imaging.  Family is agreeable.  EKG ordered without acute findings recommending EMS transport, so patient will be transported POV with family.  Final Clinical Impressions(s) / UC Diagnoses   Final diagnoses:  Hypertensive urgency  Fever, unspecified   Discharge Instructions   None    ED Prescriptions   None    PDMP not reviewed this encounter.   Francene Finders, PA-C 07/25/21 [phone]

## 2021-07-25 NOTE — Consult Note (Signed)
Cardiology Consultation:   Patient ID: Troy May MRN: [id number]; DOB: 1950/10/17  Admit date: 07/24/2021 Date of Consult: 07/25/2021  PCP:  Merryl Hacker, No   [facility] Providers Cardiologist:  None        Patient Profile:   Troy May is a 70 y.o. male who is being seen 07/25/2021 for the evaluation of troponin elevation at the request of Dr. Dolores Frame.   History of Present Illness:   Troy May is a 70 y.o. male who is being seen 07/25/2021 for the evaluation of troponin elevation at the request of Dr. Dolores Frame. He has been having chronic cough, fevers and fatigue since the past several days hence he came to the ED. He was hypertensive on arrival needing labetolol. Also complains of pleuritic chest pain which is sharp in origin and sometimes has some pressure but mainly pleuritic. No leg edema as such. Chest pain worsens with coughing. He does have nausea and SOB. Fever has been high grade. ECG shows non specific ST changes. Troponin was 159 and 161. Labs largely unremarkable with Cr 1.56. Of note, Influenza A positive. Cardiology was called for ?troponin elevation, and chest pain.      Inpatient Medications: Scheduled Meds:  amLODipine  10 mg Oral Daily   aspirin EC  81 mg Oral Daily   enoxaparin (LOVENOX) injection  1 mg/kg Subcutaneous Q12H   losartan  50 mg Oral Daily   oseltamivir  30 mg Oral BID   Continuous Infusions:  lactated ringers 100 mL/hr at 07/25/21 0223   PRN Meds: acetaminophen **OR** acetaminophen, ondansetron **OR** ondansetron (ZOFRAN) IV, senna-docusate  Allergies:   No Known Allergies  Social History:   Social History   Socioeconomic History   Marital status: Married    Spouse name: Not on file   Number of children: Not on file   Years of education: Not on file   Highest education level: Not on file  Occupational History   Not on file  Tobacco Use   Smoking status: Some Days    Types: Cigarettes   Smokeless tobacco: Not on file   Substance and Sexual Activity   Alcohol use: Not on file   Drug use: Not on file   Sexual activity: Not on file  Other Topics Concern   Not on file  Social History Narrative   Not on file   Social Determinants of Health   Financial Resource Strain: Not on file  Food Insecurity: Not on file  Transportation Needs: Not on file  Physical Activity: Not on file  Stress: Not on file  Social Connections: Not on file  Intimate Partner Violence: Not on file    Family History:    No sudden cardiac deaths  ROS:  Please see the history of present illness.   All other ROS reviewed and negative.     Physical Exam/Data:   Vitals:   07/25/21 0200 07/25/21 0215 07/25/21 0415 07/25/21 0445  BP: (!) 158/101 (!) 148/95 131/90 129/90  Pulse: 80 71 65 64  Resp: (!) 29 (!) 29 19 19   Temp:      TempSrc:      SpO2: 93% 98% 93% 92%  Weight:      Height:       No intake or output data in the 24 hours ending 07/25/21 0600 Last 3 Weights 07/24/2021  Weight (lbs) 200 lb  Weight (kg) 90.719 kg     Body mass index is 28.7 kg/m.  General:  Well nourished, well developed, in  no acute distress HEENT: normal Neck: no JVD Vascular: No carotid bruits; Distal pulses 2+ bilaterally Cardiac:  normal S1, S2; RRR; no murmur  Lungs:  clear to auscultation bilaterally, no wheezing, rhonchi or rales  Abd: soft, nontender, no hepatomegaly  Ext: no edema Musculoskeletal:  No deformities, BUE and BLE strength normal and equal Skin: warm and dry  Neuro:  CNs 2-12 intact, no focal abnormalities noted Psych:  Normal affect   EKG:  The EKG was personally reviewed and demonstrates: non-specific ST changes   Laboratory Data:  High Sensitivity Troponin:   Recent Labs  Lab 07/24/21 2021 07/24/21 2340  TROPONINIHS 159* 161*     Chemistry Recent Labs  Lab 07/24/21 2021 07/25/21 0248  NA 135 132*  K 4.4 3.9  CL 101 101  CO2 27 23  GLUCOSE 110* 93  BUN 19 18  CREATININE 1.56* 1.28*  CALCIUM  8.2* 7.8*  GFRNONAA 47* >60  ANIONGAP 7 8    No results for input(s): PROT, ALBUMIN, AST, ALT, ALKPHOS, BILITOT in the last 168 hours. Lipids  Recent Labs  Lab 07/25/21 0248  CHOL 171  TRIG 47  HDL 39*  LDLCALC 123*  CHOLHDL 4.4    Hematology Recent Labs  Lab 07/24/21 2021  WBC 5.4  RBC 4.89  HGB 15.6  HCT 47.2  MCV 96.5  MCH 31.9  MCHC 33.1  RDW 15.3  PLT 111*   Thyroid No results for input(s): TSH, FREET4 in the last 168 hours.  BNP Recent Labs  Lab 07/24/21 2021  BNP 1,111.1*    DDimer No results for input(s): DDIMER in the last 168 hours.   Radiology/Studies:  DG Chest 2 View  Result Date: 07/24/2021 CLINICAL DATA:  Cough, congestion, sore throat. EXAM: CHEST - 2 VIEW COMPARISON:  06/28/2008. FINDINGS: The heart is enlarged. The mediastinal structures are within normal limits. No consolidation, effusion, or pneumothorax is seen. No acute osseous abnormality. IMPRESSION: Cardiomegaly with no acute process. Electronically Signed   By: Brett Fairy M.D.   On: 07/24/2021 21:14     Assessment and Plan:   # Troponin Elevation # Type 2 MI # Pleuritic chest pain  -Low suspicion for ACS here. Chest pain seems pleuritic. Troponin elevation could be due to PE, type 2 MI in the setting of fever or could be due to myocarditis from Influenza -Troponin is non-trending -ECG shows non-specific changes -Will recommend to get Echo in AM -Depending on echo results, can decide further evaluation -If pain persists, can consider CT to rule out PE -Agree with the excellent management of Influenza as per primary.    For questions or updates, please contact [place] Please consult [email or web address] for contact info under    Signed, Jaci Lazier, MD  07/25/2021 6:00 AM

## 2021-07-25 NOTE — ED Notes (Signed)
Pt returned from CT °

## 2021-07-25 NOTE — Progress Notes (Signed)
PROGRESS NOTE  Troy May GUR:[id number] DOB: March 14, 1951 DOA: 07/24/2021 PCP: Pcp, No  HPI/Recap of past 24 hours: Troy May is a 70 y.o. male with medical history significant for no chronic medical problems who presents for evaluation of fever, chills, shortness of breath and cough for the last 2 days.  He reports that this afternoon he also developed chest pain that he described as a sharp substernal pain with periods of chest pressure in between the sharp pain.  Chest pain was worsened with coughing.  He reports having increased shortness of breath with exertion which is not normal for him.  States he has not been to a doctor in the past few decades and has no known chronic medical problems.  He does not take any chronic medications.  Reports the chest pain did not radiate.  He has had shortness of breath and nausea.  States he has had a fever to 101 degrees for the last 2 days that improved with Motrin yesterday.  States he has a dry nonproductive cough.  He has not had any prolonged immobilization or prolonged travel.  He has never had DVT or PE.  He has never had cardiac problems or cardiac work-up in the past. He has a history of smoking but states he quit a month ago.  States he would smoke 1-2 mini cigars a day.  Denies alcohol or illicit drug use.  Work-up in the ED revealed influenza A infection for which she was started on Tamiflu.  Also revealed elevated troponin, uptrending, for which cardiology was consulted by EDP.  07/25/2021: Patient was seen and examined at his bedside.  His wife was present in the room.  He reports having some chest discomfort associated with coughing.  He denies any recent lengthy trips.  Denies any lower extremity pain or edema.  2D echo completed on 07/25/2021 revealing apical thrombus.  Cardiology following, started on heparin drip per cardiology.  CTA chest ordered to rule out acute PE.    Assessment/Plan: Principal Problem:   NSTEMI (non-ST elevated  myocardial infarction) ([place]) Active Problems:   Influenza A   Essential hypertension   AKI (acute kidney injury) ([place])  Influenza A infection Presented with fevers, nonproductive cough, chest discomfort. Respiratory viral panel positive for influenza A on 07/25/2021. Started on Tamiflu 30 mg twice daily x5 days. Continue supportive care with antitussives as needed Bronchodilators as needed Mobilize as tolerated. Not hypoxic with O2 saturation 95% on room air. Maintain O2 saturation greater than 92%.  Apical thrombus, seen on 2D echo, unclear etiology Full dose subcu Lovenox started on admission discontinue Heparin drip started on 07/25/2021. Closely monitor on cardiac telemetry.  Elevated troponin, rule out ACS Troponin on admission 159, uptrending. 2D echo done on 07/25/2021 showed apical thrombus, rest of official read by cardiology is pending. Started on heparin drip Cardiology following Rule out PE, CTA chest is pending.  Acute CHF, unspecified Presented with elevated BNP greater than 1000 Cardiomegaly seen on chest x-ray Trace lower extremity edema bilaterally. No prior history of 2D echo 2D echo obtained, official read by cardiology is pending.  Resolved AKI Presented with creatinine of 1.56 with GFR 47 Creatinine appears to be back to baseline 1.28 with GFR greater than 60 Avoid nephrotoxic agents, dehydration and hypotension Closely monitor urine output with strict I's and O's  Critical care time: 65 minutes     Code Status: Full code  Family Communication: Wife at bedside  Disposition Plan: Likely will discharge to home once  cardiology signs off.   Consultants: Cardiology  Procedures: 2D echo  Antimicrobials: Tamiflu  DVT prophylaxis: Heparin drip  Status is: Inpatient status.  Patient will require at least 2 midnights for further evaluation and treatment of present condition.       Objective: Vitals:   07/25/21 0715 07/25/21 0939  07/25/21 1000 07/25/21 1015  BP: (!) 126/91  (!) 145/97 (!) 140/107  Pulse: 64  64 65  Resp: 19  17 19   Temp:  98.3 F (36.8 C)    TempSrc:  Oral    SpO2: 94%  97% 93%  Weight:      Height:       No intake or output data in the 24 hours ending 07/25/21 1058 Filed Weights   07/24/21 1927  Weight: 90.7 kg    Exam:  General: 70 y.o. year-old male well developed well nourished in no acute distress.  Alert and oriented x3. Cardiovascular: Regular rate and rhythm with no rubs or gallops.  No thyromegaly or JVD noted.   Respiratory: Clear to auscultation with no wheezes or rales. Good inspiratory effort. Abdomen: Soft nontender nondistended with normal bowel sounds x4 quadrants. Musculoskeletal: Trace lower extremity edema. 2/4 pulses in all 4 extremities. Skin: No ulcerative lesions noted or rashes, Psychiatry: Mood is appropriate for condition and setting   Data Reviewed: CBC: Recent Labs  Lab 07/24/21 2021  WBC 5.4  NEUTROABS 3.3  HGB 15.6  HCT 47.2  MCV 96.5  PLT 096*   Basic Metabolic Panel: Recent Labs  Lab 07/24/21 2021 07/25/21 0248  NA 135 132*  K 4.4 3.9  CL 101 101  CO2 27 23  GLUCOSE 110* 93  BUN 19 18  CREATININE 1.56* 1.28*  CALCIUM 8.2* 7.8*   GFR: Estimated Creatinine Clearance: 60.8 mL/min (A) (by C-G formula based on SCr of 1.28 mg/dL (H)). Liver Function Tests: No results for input(s): AST, ALT, ALKPHOS, BILITOT, PROT, ALBUMIN in the last 168 hours. No results for input(s): LIPASE, AMYLASE in the last 168 hours. No results for input(s): AMMONIA in the last 168 hours. Coagulation Profile: No results for input(s): INR, PROTIME in the last 168 hours. Cardiac Enzymes: No results for input(s): CKTOTAL, CKMB, CKMBINDEX, TROPONINI in the last 168 hours. BNP (last 3 results) No results for input(s): PROBNP in the last 8760 hours. HbA1C: No results for input(s): HGBA1C in the last 72 hours. CBG: No results for input(s): GLUCAP in the last 168  hours. Lipid Profile: Recent Labs    07/25/21 0248  CHOL 171  HDL 39*  LDLCALC 123*  TRIG 47  CHOLHDL 4.4   Thyroid Function Tests: No results for input(s): TSH, T4TOTAL, FREET4, T3FREE, THYROIDAB in the last 72 hours. Anemia Panel: No results for input(s): VITAMINB12, FOLATE, FERRITIN, TIBC, IRON, RETICCTPCT in the last 72 hours. Urine analysis:    Component Value Date/Time   COLORURINE YELLOW 07/25/2021 0256   APPEARANCEUR CLEAR 07/25/2021 0256   LABSPEC 1.018 07/25/2021 0256   PHURINE 5.0 07/25/2021 0256   GLUCOSEU NEGATIVE 07/25/2021 0256   HGBUR NEGATIVE 07/25/2021 0256   BILIRUBINUR NEGATIVE 07/25/2021 0256   KETONESUR NEGATIVE 07/25/2021 0256   PROTEINUR NEGATIVE 07/25/2021 0256   NITRITE NEGATIVE 07/25/2021 0256   LEUKOCYTESUR NEGATIVE 07/25/2021 0256   Sepsis Labs: @LABRCNTIP (procalcitonin:4,lacticidven:4)  ) Recent Results (from the past 240 hour(s))  Resp Panel by RT-PCR (Flu A&B, Covid) Nasopharyngeal Swab     Status: Abnormal   Collection Time: 07/24/21  7:56 PM   Specimen: Nasopharyngeal Swab;  Nasopharyngeal(NP) swabs in vial transport medium  Result Value Ref Range Status   SARS Coronavirus 2 by RT PCR NEGATIVE NEGATIVE Final    Comment: (NOTE) SARS-CoV-2 target nucleic acids are NOT DETECTED.  The SARS-CoV-2 RNA is generally detectable in upper respiratory specimens during the acute phase of infection. The lowest concentration of SARS-CoV-2 viral copies this assay can detect is 138 copies/mL. A negative result does not preclude SARS-Cov-2 infection and should not be used as the sole basis for treatment or other patient management decisions. A negative result may occur with  improper specimen collection/handling, submission of specimen other than nasopharyngeal swab, presence of viral mutation(s) within the areas targeted by this assay, and inadequate number of viral copies(<138 copies/mL). A negative result must be combined with clinical  observations, patient history, and epidemiological information. The expected result is Negative.  Fact Sheet for Patients:  EntrepreneurPulse.com.au  Fact Sheet for Healthcare Providers:  IncredibleEmployment.be  This test is no t yet approved or cleared by the [place] FDA and  has been authorized for detection and/or diagnosis of SARS-CoV-2 by FDA under an Emergency Use Authorization (EUA). This EUA will remain  in effect (meaning this test can be used) for the duration of the COVID-19 declaration under Section 564(b)(1) of the Act, 21 U.S.C.section 360bbb-3(b)(1), unless the authorization is terminated  or revoked sooner.       Influenza A by PCR POSITIVE (A) NEGATIVE Final   Influenza B by PCR NEGATIVE NEGATIVE Final    Comment: (NOTE) The Xpert Xpress SARS-CoV-2/FLU/RSV plus assay is intended as an aid in the diagnosis of influenza from Nasopharyngeal swab specimens and should not be used as a sole basis for treatment. Nasal washings and aspirates are unacceptable for Xpert Xpress SARS-CoV-2/FLU/RSV testing.  Fact Sheet for Patients: EntrepreneurPulse.com.au  Fact Sheet for Healthcare Providers: IncredibleEmployment.be  This test is not yet approved or cleared by the [place] FDA and has been authorized for detection and/or diagnosis of SARS-CoV-2 by FDA under an Emergency Use Authorization (EUA). This EUA will remain in effect (meaning this test can be used) for the duration of the COVID-19 declaration under Section 564(b)(1) of the Act, 21 U.S.C. section 360bbb-3(b)(1), unless the authorization is terminated or revoked.  Performed at [facility], [address] [place], [place] [address]       Studies: DG Chest 2 View  Result Date: 07/24/2021 CLINICAL DATA:  Cough, congestion, sore throat. EXAM: CHEST - 2 VIEW COMPARISON:  06/28/2008. FINDINGS: The heart is enlarged. The  mediastinal structures are within normal limits. No consolidation, effusion, or pneumothorax is seen. No acute osseous abnormality. IMPRESSION: Cardiomegaly with no acute process. Electronically Signed   By: Brett Fairy M.D.   On: 07/24/2021 21:14    Scheduled Meds:  amLODipine  10 mg Oral Daily   aspirin EC  81 mg Oral Daily   losartan  50 mg Oral Daily   oseltamivir  30 mg Oral BID    Continuous Infusions:  heparin 1,450 Units/hr (07/25/21 1053)   lactated ringers 100 mL/hr at 07/25/21 0223     LOS: 0 days     Kayleen Memos, MD [facility] Pager [phone]  If 7PM-7AM, please contact night-coverage [email or web address] Password [facility] 07/25/2021, 10:58 AM

## 2021-07-25 NOTE — ED Notes (Signed)
Echo at the bedside °

## 2021-07-26 DIAGNOSIS — I10 Essential (primary) hypertension: Secondary | ICD-10-CM | POA: Diagnosis not present

## 2021-07-26 DIAGNOSIS — N179 Acute kidney failure, unspecified: Secondary | ICD-10-CM | POA: Diagnosis not present

## 2021-07-26 DIAGNOSIS — I5041 Acute combined systolic (congestive) and diastolic (congestive) heart failure: Secondary | ICD-10-CM | POA: Diagnosis not present

## 2021-07-26 DIAGNOSIS — I214 Non-ST elevation (NSTEMI) myocardial infarction: Principal | ICD-10-CM

## 2021-07-26 LAB — BASIC METABOLIC PANEL
Anion gap: 6 (ref 5–15)
BUN: 16 mg/dL (ref 8–23)
CO2: 27 mmol/L (ref 22–32)
Calcium: 7.6 mg/dL — ABNORMAL LOW (ref 8.9–10.3)
Chloride: 101 mmol/L (ref 98–111)
Creatinine, Ser: 1.33 mg/dL — ABNORMAL HIGH (ref 0.61–1.24)
GFR, Estimated: 58 mL/min — ABNORMAL LOW (ref >60)
Glucose, Bld: 140 mg/dL — ABNORMAL HIGH (ref 70–99)
Potassium: 3.7 mmol/L (ref 3.5–5.1)
Sodium: 134 mmol/L — ABNORMAL LOW (ref 135–145)

## 2021-07-26 LAB — HEPARIN LEVEL (UNFRACTIONATED)
Heparin Unfractionated: 0.9 IU/mL — ABNORMAL HIGH (ref 0.30–0.70)
Heparin Unfractionated: 0.6 IU/mL (ref 0.30–0.70)
Heparin Unfractionated: 0.54 IU/mL (ref 0.30–0.70)

## 2021-07-26 LAB — CBC
HCT: 40.8 % (ref 39.0–52.0)
Hemoglobin: 14 g/dL (ref 13.0–17.0)
MCH: 32.3 pg (ref 26.0–34.0)
MCHC: 34.3 g/dL (ref 30.0–36.0)
MCV: 94 fL (ref 80.0–100.0)
Platelets: 103 10*3/uL — ABNORMAL LOW (ref 150–400)
RBC: 4.34 MIL/uL (ref 4.22–5.81)
RDW: 14.8 % (ref 11.5–15.5)
WBC: 3.8 10*3/uL — ABNORMAL LOW (ref 4.0–10.5)
nRBC: 0 % (ref 0.0–0.2)

## 2021-07-26 IMAGING — MR MR ABDOMEN WO/W CM
9 of 18 series · 21 of 48 positions shown · IV contrast (YES GAD)
Comparison: No prior abdominal MRI.  Chest CTA [DATE].

CLINICAL DATA: 70-year-old male with history of renal mass.

EXAM:
MRI ABDOMEN WITHOUT AND WITH CONTRAST
TECHNIQUE: Multiplanar multisequence MR imaging of the abdomen was performed
both before and after the administration of intravenous contrast.
CONTRAST:  8mL GADAVIST GADOBUTROL 1 MMOL/ML IV SOLN, 8.4mL GADAVIST
GADOBUTROL 1 MMOL/ML IV SOLN

[Series 9: cor ssfse nav · coronal · 6.0mm · 0.78mm/px · 2 of 40 slices shown]
[im 1/40]
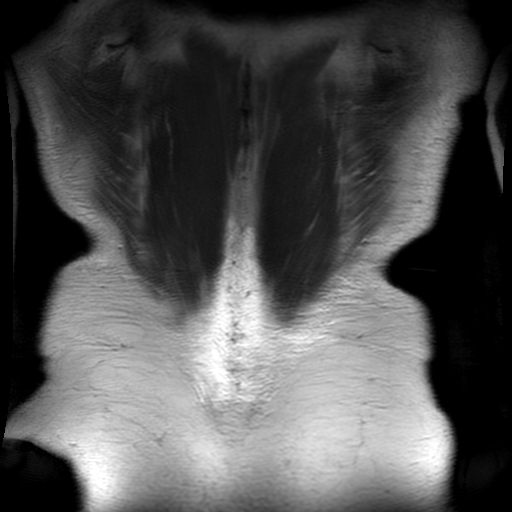
[im 40/40]
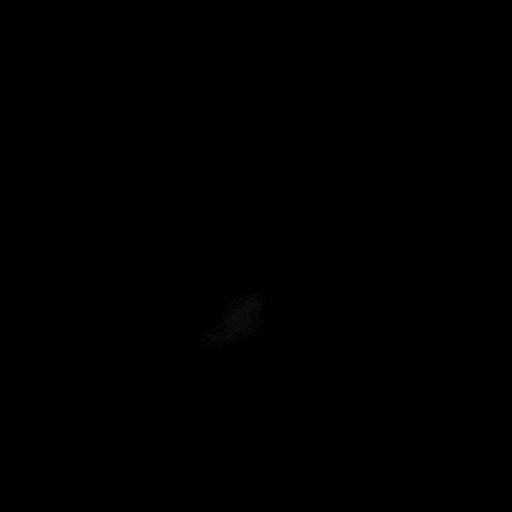

[Series 11: ax ssfse bh · axial · 6.0mm · 0.74mm/px · z∈[-171,+57]mm · 2 of 39 slices shown]
[im 1/39]
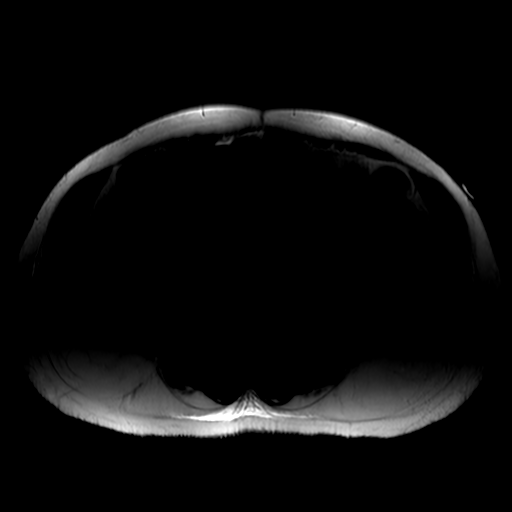
[im 39/39]
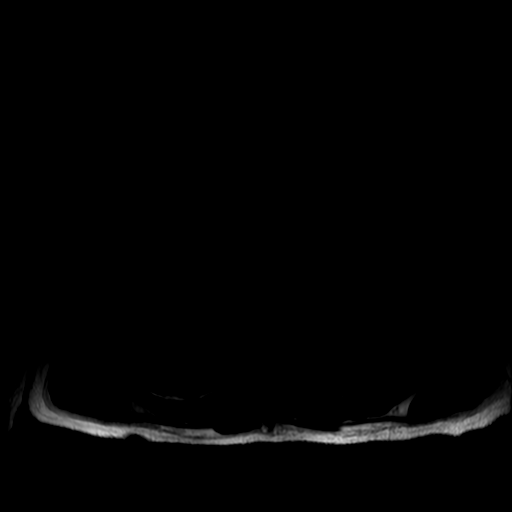

[Series 12: T2 fat-sat · axial · 6.0mm · 0.74mm/px · z∈[-170,+55]mm · 2 of 35 slices shown]
[im 1/35]
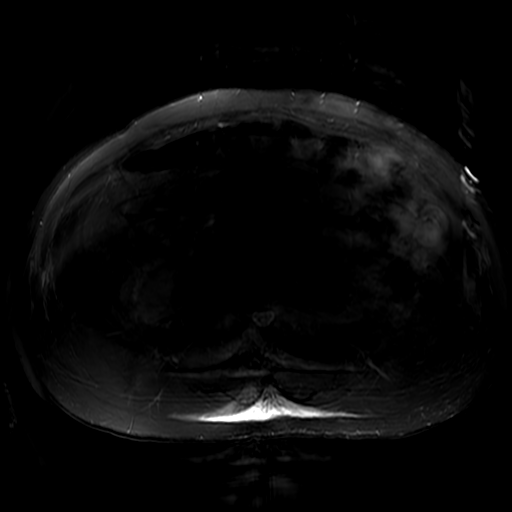
[im 35/35]
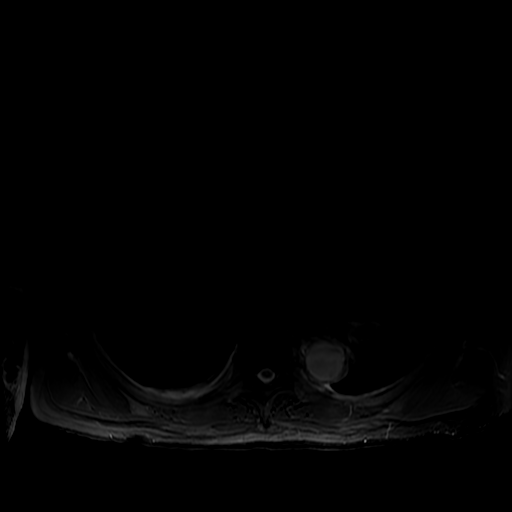

[Series 14: DWI b500 · axial · 8.0mm · 1.76mm/px · 1 of 45 slices shown]
[im 1/45]
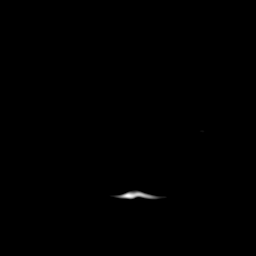

[Series 18: T1 dynamic · coronal · 3.4mm · 1.56mm/px · 4 of 142 slices shown]
[im 1/142]
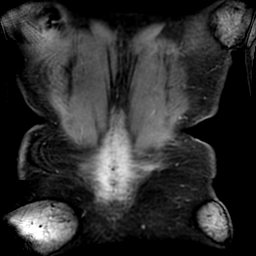
[im 48/142]
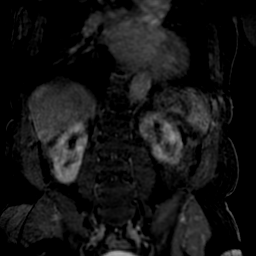
[im 95/142]
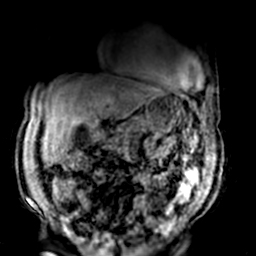
[im 142/142]
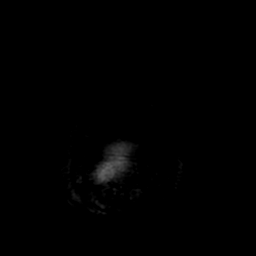

[Series 1450: ADC · axial · 8.0mm · 1.76mm/px · 1 of 21 slices shown]
[im 1/21]
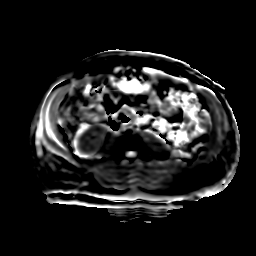

[Series 1600: T1 dynamic post-contrast · axial · non-contrast · 3.9mm · 0.86mm/px · z∈[-182,+68]mm · 3 of 126 slices shown (1 of 3)]
[im 1/126]
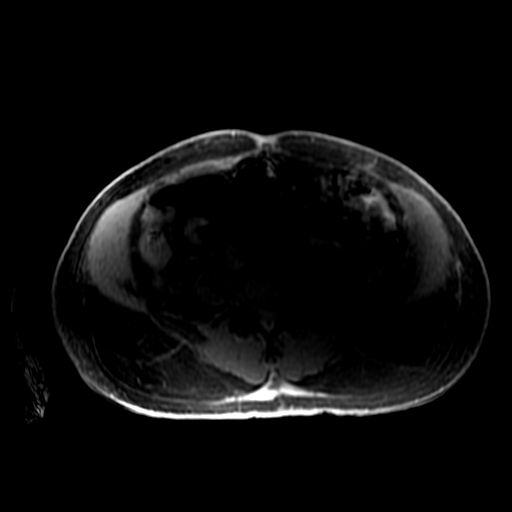
[im 63/126]
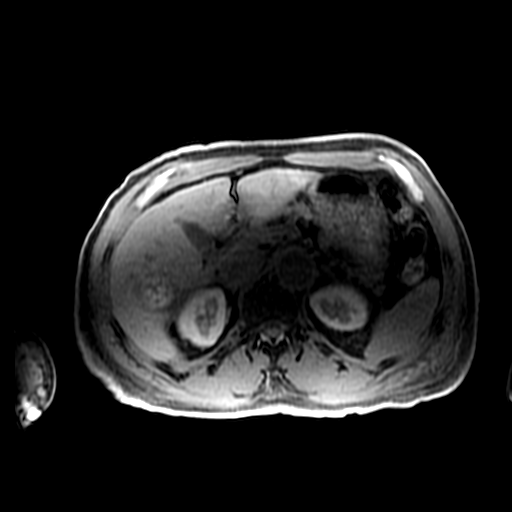
[im 126/126]
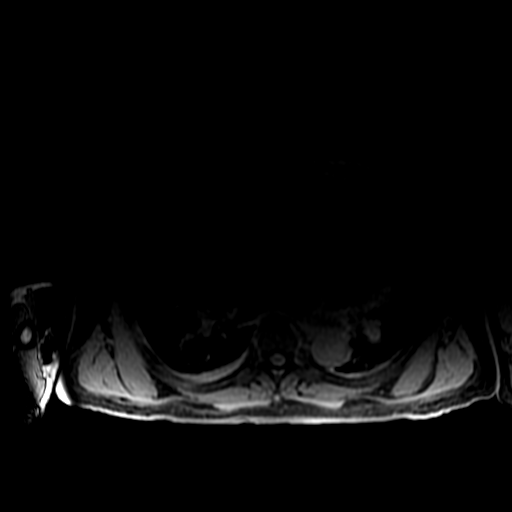

[Series 1601: T1 dynamic post-contrast · axial · non-contrast · 3.9mm · 0.86mm/px · z∈[-182,+68]mm · 3 of 126 slices shown (2 of 3)]
[im 1/126]
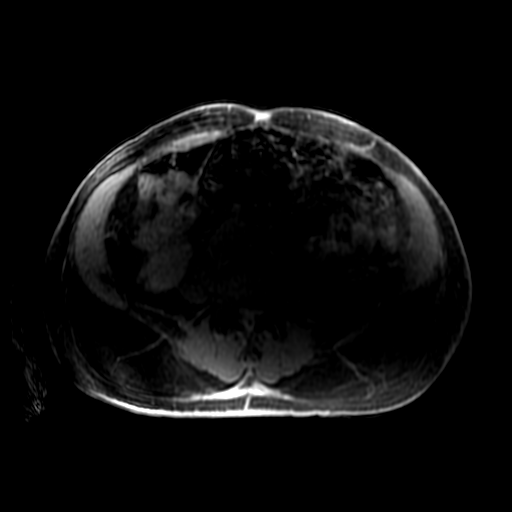
[im 63/126]
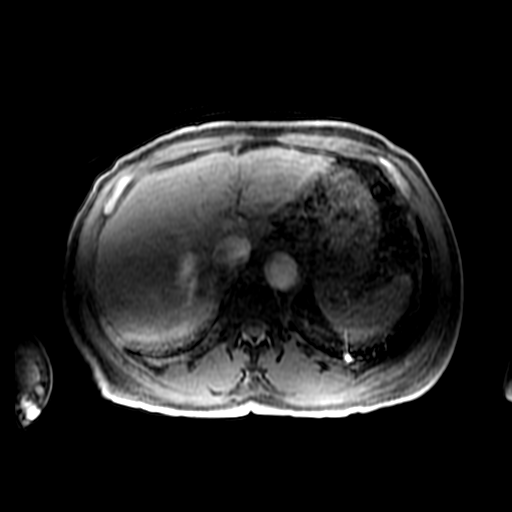
[im 126/126]
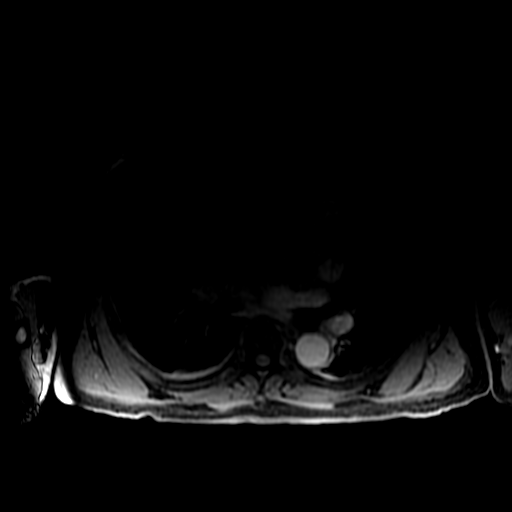

[Series 1602: T1 dynamic post-contrast · axial · non-contrast · 3.9mm · 0.86mm/px · z∈[-182,+68]mm · 3 of 126 slices shown (3 of 3)]
[im 1/126]
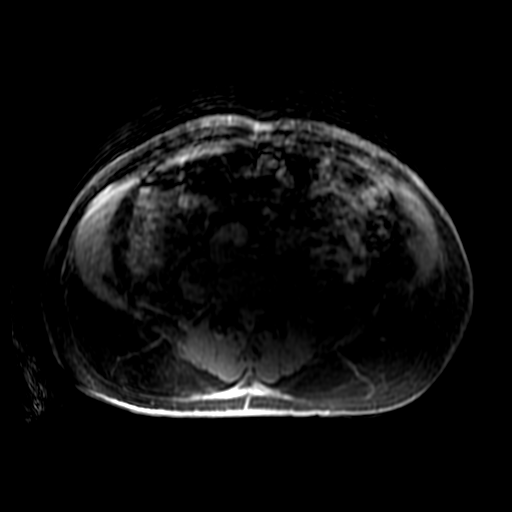
[im 63/126]
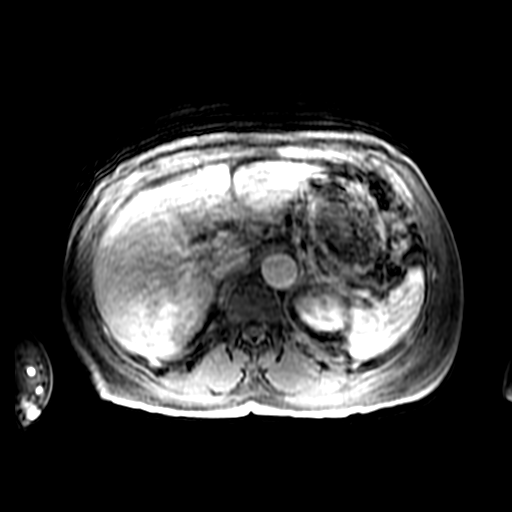
[im 126/126]
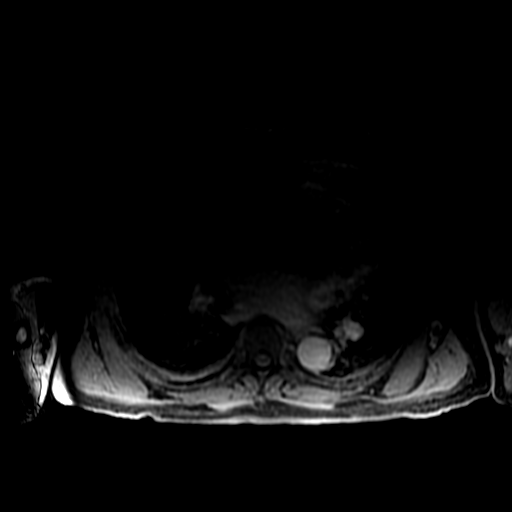

[21 of 48 positions shown; findings below may reference images not displayed]

FINDINGS: Comment: Portions of today's examination are severely limited by
large amount of patient respiratory motion.

Lower chest: Cardiomegaly with left ventricular dilatation, rounding
of the apex of the left ventricle, and focal area of increased T1
signal intensity in the apex of the left ventricle, corresponding to
a filling defect on post gadolinium images, indicative of
intracardiac thrombus.

Hepatobiliary: Liver has a slightly shrunken appearance and nodular
contour, indicative of underlying cirrhosis. No discrete cystic or
solid hepatic lesions. No intra or extrahepatic biliary ductal
dilatation. Gallbladder is normal in appearance.

Pancreas: No pancreatic mass. No pancreatic ductal dilatation. No
pancreatic or peripancreatic fluid collections or inflammatory
changes.

Spleen:  Unremarkable.

Adrenals/Urinary Tract: The lesions of concern in the kidneys
bilaterally are heterogeneous in signal intensity. The largest of
these lesions is in the lateral aspect of the interpolar region of
the right kidney (axial image 78 of series [G3]) measuring 5.6 x
cm, predominantly T1 hyperintense and T2 hypointense with no
definitive internal enhancement on post gadolinium imaging (although
subtraction imaging is severely limited by variable degrees of
inspiration). The smaller lesion in the posterior aspect of the
interpolar region of the left kidney measures 3.9 x 3.8 cm (axial
image 85 of series [G3]) and is more heterogeneous in T1 signal
intensity with some areas of T1 hyperintensity, predominantly T2
hypointense, with equivocal low-level enhancement on post gadolinium
imaging. Importantly, however, there is a smaller lesion in the
anterior aspect of the lower pole of the right kidney (axial image
92 of series [G3]) which measures 2.2 x 1.9 cm and is T1
hypointense, poorly visualized on T2 weighted images but
predominantly mildly T2 hypointense, and demonstrates some low-level
post gadolinium enhancement, concerning for neoplasm. These lesions
are all encapsulated within Gerota's fascia and are separated from
the renal veins which are widely patent bilaterally. No
hydroureteronephrosis. Bilateral adrenal glands are normal in
appearance.

Stomach/Bowel: Visualized portions are unremarkable.

Vascular/Lymphatic: Aortic atherosclerosis with fusiform aneurysmal
dilatation of the infrarenal abdominal aorta which measures up to 3
cm in diameter in the visualized portions of the abdomen. No
definite lymphadenopathy confidently identified in the abdomen.

Other: No significant volume of ascites noted in the visualized
portions of the peritoneal cavity.

Musculoskeletal: No aggressive appearing osseous lesions are noted
in the visualized portions of the skeleton.
IMPRESSION: 1. There are renal lesions bilaterally concerning for probable renal
cell carcinoma, favored to represent papillary renal cell
carcinomas, as discussed above. In addition, there is a larger
lesion in the lateral aspect of the interpolar region of the right
kidney which is favored to represent a large hemorrhagic cyst.
Urologic consultation is recommended.
2. Intracardiac thrombus in the apex of the left ventricle, which
places the patient at risk for systemic embolization. Consultation
with Cardiology for clinical management is recommended.
3. Cirrhosis.
4. Aortic atherosclerosis with fusiform aneurysmal dilatation of the
infrarenal abdominal aorta which measures up to 3.0 cm in diameter.

These results will be called to the ordering clinician or
representative by the Radiologist Assistant, and communication
documented in the PACS or [REDACTED].

## 2021-07-26 MED ORDER — IPRATROPIUM-ALBUTEROL 0.5-2.5 (3) MG/3ML IN SOLN: 3.0000 mL | Freq: Four times a day (QID) | RESPIRATORY_TRACT | Status: DC | PRN

## 2021-07-26 MED ORDER — SODIUM CHLORIDE 0.9 % IV SOLN: INTRAVENOUS | Status: DC

## 2021-07-26 MED ORDER — SODIUM CHLORIDE 0.9% FLUSH: 3.0000 mL | INTRAVENOUS | Status: DC | PRN

## 2021-07-26 MED ORDER — SODIUM CHLORIDE 0.9% FLUSH
3.0000 mL | Freq: Two times a day (BID) | INTRAVENOUS | Status: DC
  Administered 2021-07-26 – 2021-07-29 (×4): 3 mL via INTRAVENOUS

## 2021-07-26 MED ORDER — SODIUM CHLORIDE 3 % IN NEBU
4.0000 mL | INHALATION_SOLUTION | RESPIRATORY_TRACT | Status: DC | PRN
  Filled 2021-07-26: qty 4

## 2021-07-26 MED ORDER — SODIUM CHLORIDE 0.9 % IV SOLN: 250.0000 mL | INTRAVENOUS | Status: DC | PRN

## 2021-07-26 MED ORDER — SODIUM CHLORIDE 3 % IN NEBU
4.0000 mL | INHALATION_SOLUTION | Freq: Two times a day (BID) | RESPIRATORY_TRACT | Status: DC
  Filled 2021-07-26 (×2): qty 4

## 2021-07-26 MED ORDER — GADOBUTROL 1 MMOL/ML IV SOLN
8.0000 mL | Freq: Once | INTRAVENOUS | Status: AC | PRN
  Administered 2021-07-26: 8 mL via INTRAVENOUS

## 2021-07-26 MED ORDER — IPRATROPIUM-ALBUTEROL 0.5-2.5 (3) MG/3ML IN SOLN
3.0000 mL | Freq: Four times a day (QID) | RESPIRATORY_TRACT | Status: DC
  Administered 2021-07-26: 08:00:00 3 mL via RESPIRATORY_TRACT
  Filled 2021-07-26: qty 3

## 2021-07-26 MED ORDER — GADOBUTROL 1 MMOL/ML IV SOLN
8.4000 mL | Freq: Once | INTRAVENOUS | Status: AC | PRN
  Administered 2021-07-26: 10:00:00 8.4 mL via INTRAVENOUS

## 2021-07-26 MED ORDER — GUAIFENESIN ER 600 MG PO TB12
1200.0000 mg | ORAL_TABLET | Freq: Two times a day (BID) | ORAL | Status: AC
  Administered 2021-07-26 – 2021-07-28 (×6): 1200 mg via ORAL
  Filled 2021-07-26 (×6): qty 2

## 2021-07-26 MED ORDER — ASPIRIN 81 MG PO CHEW
81.0000 mg | CHEWABLE_TABLET | ORAL | Status: AC
  Administered 2021-07-27: 06:00:00 81 mg via ORAL
  Filled 2021-07-26: qty 1

## 2021-07-26 NOTE — Progress Notes (Signed)
ANTICOAGULATION CONSULT NOTE - Follow Up Consult  Pharmacy Consult for heparin Indication:  apical thrombus  Labs: Recent Labs    07/24/21 2021 07/24/21 2340 07/25/21 0248 07/25/21 1622 07/26/21 0238  HGB 15.6  --   --   --   --   HCT 47.2  --   --   --   --   PLT 111*  --   --   --   --   HEPARINUNFRC  --   --   --  0.99* 0.90*  CREATININE 1.56*  --  1.28*  --   --   TROPONINIHS 159* 161*  --   --   --     Assessment: 70yo male remains supratherapeutic on heparin after rate change; no infusion issues or signs of bleeding per RN.  Goal of Therapy:  Heparin level 0.3-0.7 units/ml   Plan:  Will decrease heparin infusion by 2-3 units/kg/hr to 1100 units/hr and check level in 6-8 hours.    Wynona Neat, PharmD, BCPS  07/26/2021,4:18 AM

## 2021-07-26 NOTE — Progress Notes (Signed)
PROGRESS NOTE  Troy May GBT:[id number] DOB: 1950-09-19 DOA: 07/24/2021 PCP: Pcp, No  HPI/Recap of past 24 hours: Troy May is a 70 y.o. male with medical history significant for tobacco use disorder who presents for evaluation of fever, chills, shortness of breath and cough for the last 2 days.  Associated with pleuritic chest pain worse with coughing.  No prior history of DVT or PE, no prolonged trips or immobilization.  Quit smoking about a month ago.  No alcohol or illicit drug use.    Work-up in the ED revealed influenza A infection for which he was started on Tamiflu on admission.  Also revealed elevated troponin, a 2D echo was ordered and cardiology was consulted.  2D echo revealed a large LV apical thrombus measuring 2.5 x 1.8 cm.  LVEF 25 to 30%, regional wall motion abnormalities, grade 2 diastolic dysfunction.  He was started on heparin drip.  He had a CT angio chest which was negative for pulmonary embolism however revealed incidentally found bilateral renal masses.  MRI abdomen with and without contrast ordered on 07/25/2021, results are pending.  07/26/2021: Patient was seen and examined at his bedside.  His wife was present in the room.  He reports a persistent productive cough.  Hypersaline nebs and Mucinex added.    Assessment/Plan: Principal Problem:   NSTEMI (non-ST elevated myocardial infarction) ([place]) Active Problems:   Influenza A   Essential hypertension   AKI (acute kidney injury) ([place])   Elevated troponin   Acute combined systolic and diastolic heart failure (HCC)   Left ventricular apical thrombus  Influenza A infection Presented with fevers, nonproductive cough, chest discomfort. Respiratory viral panel positive for influenza A on 07/25/2021. Started on Tamiflu 30 mg on admission twice daily x5 days. Continue supportive care with antitussives as needed Bronchodilators as needed Mobilize as tolerated. Not hypoxic with O2 saturation 95% on room  air. Maintain O2 saturation greater than 92%. Added hypersaline nebs twice daily x3 days and Mucinex 1200 mg twice daily x3 days. DuoNebs every 6 hours.  Apical thrombus, seen on 2D echo, unclear etiology Full dose subcu Lovenox started on admission discontinued Heparin drip started on 07/25/2021. Closely monitor on cardiac telemetry.  Bilateral renal masses, incidentally found. Suspicion for renal cell carcinoma Personally reviewed CT scan done on 07/25/2021 showing bilateral renal masses. MRI abdomen ordered and results are pending. Continue to follow renal function.  Leukopenia/thrombocytopenia Suspect in the setting of acute illness/viral illness Continue to monitor CBC WBC downtrending 3.8 from 5.4, platelet 103 from 111 Continue to monitor  Elevated troponin, rule out ACS Troponin on admission 159, up trended to 161 Continue heparin drip Plan for diagnostic heart cath on 07/27/2021.  Acute diastolic and systolic CHF Presented with elevated BNP greater than 1000 Cardiomegaly seen on chest x-ray Trace lower extremity edema bilaterally. 2D echo done on 07/25/2021 showed a large LV apical thrombus measuring 2.5 x 1.8 cm.  LVEF 25 to 30%, regional wall motion abnormalities, grade 2 diastolic dysfunction. Start strict I's and O's and daily weight.  AKI Presented with creatinine of 1.56 with GFR 47 Creatinine uptrending 1.33 from 1.28.  On presentation creatinine 1.56. Baseline creatinine appears to be 1.2 with GFR greater than 60. Continue to avoid nephrotoxic agents, dehydration and hypotension Continue to closely monitor urine output with strict I's and O's  Hypovolemic hyponatremia Serum sodium is uptrending from 1 32-1 34 Continue to encourage increasing solute oral intake Obtain CMP in the morning to assess liver function as well.  Critical care time: 45 minutes.     Code Status: Full code  Family Communication: Wife at bedside  Disposition Plan: Likely  will discharge to home once cardiology signs off.   Consultants: Cardiology  Procedures: 2D echo  Antimicrobials: Tamiflu  DVT prophylaxis: Heparin drip  Status is: Inpatient status.  Patient will require at least 2 midnights for further evaluation and treatment of present condition.       Objective: Vitals:   07/26/21 0535 07/26/21 0746 07/26/21 0813 07/26/21 1036  BP: (!) 148/101 (!) 142/95  (!) 135/97  Pulse: 70 65  64  Resp: 15 16  20   Temp: 97.9 F (36.6 C) 98.8 F (37.1 C)  98 F (36.7 C)  TempSrc: Oral Oral  Oral  SpO2: 99% 99% 98% 100%  Weight: 84.7 kg     Height:        Intake/Output Summary (Last 24 hours) at 07/26/2021 1108 Last data filed at 07/26/2021 1037 Gross per 24 hour  Intake 1022.89 ml  Output 850 ml  Net 172.89 ml   Filed Weights   07/24/21 1927 07/25/21 1411 07/26/21 0535  Weight: 90.7 kg 85.6 kg 84.7 kg    Exam:  General: 70 y.o. year-old male well-developed well-nourished in no acute distress.  He is alert and oriented x3. Cardiovascular: Regular rate and rhythm no rubs or gallops.  No JVD or thyromegaly noted. Respiratory: Clear to auscultation with no wheezes or rales.  Good inspiratory effort.   Abdomen: Soft nontender normal bowel sounds present.  Nondistended. Musculoskeletal: Trace lower extremity edema bilaterally.   Skin: No ulcerative lesions noted. Psychiatry: Mood is appropriate for condition and setting. Neuro: Moves all 4 extremities equally, nonfocal.   Data Reviewed: CBC: Recent Labs  Lab 07/24/21 2021 07/26/21 0238  WBC 5.4 3.8*  NEUTROABS 3.3  --   HGB 15.6 14.0  HCT 47.2 40.8  MCV 96.5 94.0  PLT 111* 366*   Basic Metabolic Panel: Recent Labs  Lab 07/24/21 2021 07/25/21 0248 07/26/21 0238  NA 135 132* 134*  K 4.4 3.9 3.7  CL 101 101 101  CO2 27 23 27   GLUCOSE 110* 93 140*  BUN 19 18 16   CREATININE 1.56* 1.28* 1.33*  CALCIUM 8.2* 7.8* 7.6*   GFR: Estimated Creatinine Clearance: 53.4  mL/min (A) (by C-G formula based on SCr of 1.33 mg/dL (H)). Liver Function Tests: No results for input(s): AST, ALT, ALKPHOS, BILITOT, PROT, ALBUMIN in the last 168 hours. No results for input(s): LIPASE, AMYLASE in the last 168 hours. No results for input(s): AMMONIA in the last 168 hours. Coagulation Profile: No results for input(s): INR, PROTIME in the last 168 hours. Cardiac Enzymes: No results for input(s): CKTOTAL, CKMB, CKMBINDEX, TROPONINI in the last 168 hours. BNP (last 3 results) No results for input(s): PROBNP in the last 8760 hours. HbA1C: No results for input(s): HGBA1C in the last 72 hours. CBG: No results for input(s): GLUCAP in the last 168 hours. Lipid Profile: Recent Labs    07/25/21 0248  CHOL 171  HDL 39*  LDLCALC 123*  TRIG 47  CHOLHDL 4.4   Thyroid Function Tests: No results for input(s): TSH, T4TOTAL, FREET4, T3FREE, THYROIDAB in the last 72 hours. Anemia Panel: No results for input(s): VITAMINB12, FOLATE, FERRITIN, TIBC, IRON, RETICCTPCT in the last 72 hours. Urine analysis:    Component Value Date/Time   COLORURINE YELLOW 07/25/2021 0256   APPEARANCEUR CLEAR 07/25/2021 0256   LABSPEC 1.018 07/25/2021 0256   PHURINE 5.0 07/25/2021 0256  GLUCOSEU NEGATIVE 07/25/2021 0256   HGBUR NEGATIVE 07/25/2021 0256   BILIRUBINUR NEGATIVE 07/25/2021 0256   KETONESUR NEGATIVE 07/25/2021 0256   PROTEINUR NEGATIVE 07/25/2021 0256   NITRITE NEGATIVE 07/25/2021 0256   LEUKOCYTESUR NEGATIVE 07/25/2021 0256   Sepsis Labs: @LABRCNTIP (procalcitonin:4,lacticidven:4)  ) Recent Results (from the past 240 hour(s))  Resp Panel by RT-PCR (Flu A&B, Covid) Nasopharyngeal Swab     Status: Abnormal   Collection Time: 07/24/21  7:56 PM   Specimen: Nasopharyngeal Swab; Nasopharyngeal(NP) swabs in vial transport medium  Result Value Ref Range Status   SARS Coronavirus 2 by RT PCR NEGATIVE NEGATIVE Final    Comment: (NOTE) SARS-CoV-2 target nucleic acids are NOT  DETECTED.  The SARS-CoV-2 RNA is generally detectable in upper respiratory specimens during the acute phase of infection. The lowest concentration of SARS-CoV-2 viral copies this assay can detect is 138 copies/mL. A negative result does not preclude SARS-Cov-2 infection and should not be used as the sole basis for treatment or other patient management decisions. A negative result may occur with  improper specimen collection/handling, submission of specimen other than nasopharyngeal swab, presence of viral mutation(s) within the areas targeted by this assay, and inadequate number of viral copies(<138 copies/mL). A negative result must be combined with clinical observations, patient history, and epidemiological information. The expected result is Negative.  Fact Sheet for Patients:  EntrepreneurPulse.com.au  Fact Sheet for Healthcare Providers:  IncredibleEmployment.be  This test is no t yet approved or cleared by the [place] FDA and  has been authorized for detection and/or diagnosis of SARS-CoV-2 by FDA under an Emergency Use Authorization (EUA). This EUA will remain  in effect (meaning this test can be used) for the duration of the COVID-19 declaration under Section 564(b)(1) of the Act, 21 U.S.C.section 360bbb-3(b)(1), unless the authorization is terminated  or revoked sooner.       Influenza A by PCR POSITIVE (A) NEGATIVE Final   Influenza B by PCR NEGATIVE NEGATIVE Final    Comment: (NOTE) The Xpert Xpress SARS-CoV-2/FLU/RSV plus assay is intended as an aid in the diagnosis of influenza from Nasopharyngeal swab specimens and should not be used as a sole basis for treatment. Nasal washings and aspirates are unacceptable for Xpert Xpress SARS-CoV-2/FLU/RSV testing.  Fact Sheet for Patients: EntrepreneurPulse.com.au  Fact Sheet for Healthcare Providers: IncredibleEmployment.be  This test is not  yet approved or cleared by the [place] FDA and has been authorized for detection and/or diagnosis of SARS-CoV-2 by FDA under an Emergency Use Authorization (EUA). This EUA will remain in effect (meaning this test can be used) for the duration of the COVID-19 declaration under Section 564(b)(1) of the Act, 21 U.S.C. section 360bbb-3(b)(1), unless the authorization is terminated or revoked.  Performed at [facility], [place] [address], [place], [place] [address]       Studies: CT Angio Chest Pulmonary Embolism (PE) W or WO Contrast  Result Date: 07/25/2021 CLINICAL DATA:  Fever, fatigue.  Evaluate for pulmonary embolism. EXAM: CT ANGIOGRAPHY CHEST WITH CONTRAST TECHNIQUE: Multidetector CT imaging of the chest was performed using the standard protocol during bolus administration of intravenous contrast. Multiplanar CT image reconstructions and MIPs were obtained to evaluate the vascular anatomy. CONTRAST:  14mL OMNIPAQUE IOHEXOL 350 MG/ML SOLN COMPARISON:  None. FINDINGS: Vascular Findings: There is adequate opacification of the pulmonary arterial system with the main pulmonary artery measuring 419 Hounsfield units. There are no discrete filling defects within the pulmonary arterial tree to suggest pulmonary embolism. Borderline enlarged caliber  the main pulmonary artery measuring 33 mm in diameter. Marked cardiomegaly, in particular, there is enlargement of the left ventricle. Coronary artery calcifications. No pericardial effusion though a small amount of fluid is seen within the pericardial recess. Mild fusiform aneurysmal dilatation of the ascending thoracic aorta measuring 43 mm in diameter (image 55, series 7). No evidence of an intramural hematoma. Review of the MIP images confirms the above findings. ---------------------------------------------------------------------------------- Nonvascular Findings: Mediastinum/Lymph Nodes: Evaluation for mediastinal and hilar adenopathy is  degraded secondary to tailoring the examination for evaluation of the pulmonary arteries. Scattered mediastinal lymph nodes are mildly prominent though not enlarged by size criteria with index prevascular lymph node measuring 0.9 cm in greatest short axis diameter (image 43, series 8). No definitive bulky mediastinal, hilar or axillary lymphadenopathy. Lungs/Pleura: Circumferential bronchial wall thickening, most conspicuously involving the bilateral lower lobes. Scattered ill-defined somewhat tree-in-bud ground-glass opacities are seen with the right upper (images 50 and 55, series 8) and lower lobes (image 77 and 86, series 8). No associated air bronchograms. Minimal bibasilar ground-glass atelectasis. No pleural effusion or pneumothorax. Apical predominant centrilobular and paraseptal emphysematous change. There is a punctate (3 mm) pulmonary nodule within right middle lobe (image 73, series 8). Upper abdomen: Limited early arterial phase evaluation of the upper abdomen demonstrates isoattenuating bilateral renal masses, the right measuring 5.5 x 4.6 cm (image 129, series 7, and the left measuring 4.8 x 4.1 cm (image 159, series 7). Mild nodularity of the hepatic contour as could be seen in the setting of cirrhotic change. Musculoskeletal: No acute or aggressive osseous abnormalities. Regional soft tissues appear normal. Normal appearance of the thyroid gland. IMPRESSION: 1. No evidence of pulmonary embolism. 2. Diffuse bronchial wall thickening with scattered ill-defined somewhat tree-in-bud ground-glass opacities within the right upper and lower lobes, nonspecific though could be seen in the setting of early atypical infection superimposed on bronchitis/airways disease. 3. Bilateral isoattenuating renal masses, the right measuring 5.5 cm and the left measuring 3.8 cm, incompletely evaluated on the present examination, though worrisome for renal cell carcinomas. Further evaluation with nonemergent renal protocol  CT or MRI could be performed as indicated. 4.  Emphysema (ICD10-J43.9). 5. Punctate (3 mm) right middle lobe pulmonary nodule. Comparison with prior outside examinations is advised. Pending the results of the above recommended dedicated renal imaging, a non-contrast chest CT can be considered in 12 months given patient's emphysematous change. This recommendation follows the consensus statement: Guidelines for Management of Incidental Pulmonary Nodules Detected on CT Images: From the Fleischner Society 2017; Radiology 2017; 284:228-243. 6. 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 Recommend annual imaging followup by CTA or MRA. This recommendation follows 2010 ACCF/AHA/AATS/ACR/ASA/SCA/SCAI/SIR/STS/SVM Guidelines for the Diagnosis and Management of Patients with Thoracic Aortic Disease. Circulation. 2010; 121: M086-P619. Aortic aneurysm NOS (ICD10-I71.9) 7. Marked cardiomegaly with enlargement of the caliber the main pulmonary artery, nonspecific though could be seen in the setting pulmonary arterial hypertension. Further evaluation with cardiac echo could be performed as indicated. 8. Coronary artery calcifications. Aortic Atherosclerosis (ICD10-I70.0). 9. Mild nodularity hepatic contour as could be seen in the setting of cirrhotic change. Correlation with LFTs is advised. Electronically Signed   By: Sandi Mariscal M.D.   On: 07/25/2021 12:18    Scheduled Meds:  aspirin EC  81 mg Oral Daily   carvedilol  6.25 mg Oral BID WC   guaiFENesin  1,200 mg Oral BID   ipratropium-albuterol  3 mL Nebulization Q6H   losartan  50  mg Oral Daily   oseltamivir  30 mg Oral BID   sodium chloride HYPERTONIC  4 mL Nebulization BID    Continuous Infusions:  heparin 1,100 Units/hr (07/26/21 1017)     LOS: 1 day     Kayleen Memos, MD [facility] Pager [phone]  If 7PM-7AM, please contact night-coverage [email or web address] Password [email or web address] 07/26/2021, 11:08 AM

## 2021-07-26 NOTE — Progress Notes (Signed)
ANTICOAGULATION CONSULT NOTE - Follow Up Consult  Pharmacy Consult for heparin Indication:  apical thrombus  Labs: Recent Labs    07/24/21 2021 07/24/21 2340 07/25/21 0248 07/25/21 1622 07/26/21 0238 07/26/21 1046  HGB 15.6  --   --   --  14.0  --   HCT 47.2  --   --   --  40.8  --   PLT 111*  --   --   --  103*  --   HEPARINUNFRC  --   --   --  0.99* 0.90* 0.60  CREATININE 1.56*  --  1.28*  --  1.33*  --   TROPONINIHS 159* 161*  --   --   --   --      Assessment: 70yo male remains therapeutic on heparin after rate change; no infusion issues or signs of bleeding per RN. Hep gtt at 1100 units/hr. CBC ok this AM.   Goal of Therapy:  Heparin level 0.3-0.7 units/ml   Plan:  Continue heparin infusion at 1100 units/hr F/u confirmatory HL in 8 hours Monitor daily HL and CBC  Joetta Manners, PharmD, [facility] Emergency Medicine Clinical Pharmacist ED RPh Phone: [place]: [phone]

## 2021-07-26 NOTE — Progress Notes (Addendum)
Progress Note  Patient Name: Troy May Date of Encounter: 07/26/2021  [facility] Cardiologist: None   Subjective   Creatinine stable at 1.33.  BP 135/97 this morning.  Denies any chest pain or dyspnea  Inpatient Medications    Scheduled Meds:  aspirin EC  81 mg Oral Daily   carvedilol  6.25 mg Oral BID WC   guaiFENesin  1,200 mg Oral BID   ipratropium-albuterol  3 mL Nebulization Q6H   losartan  50 mg Oral Daily   oseltamivir  30 mg Oral BID   sodium chloride HYPERTONIC  4 mL Nebulization BID   Continuous Infusions:  heparin 1,100 Units/hr (07/26/21 1017)   PRN Meds: acetaminophen **OR** acetaminophen, ondansetron **OR** ondansetron (ZOFRAN) IV, senna-docusate   Vital Signs    Vitals:   07/26/21 0535 07/26/21 0746 07/26/21 0813 07/26/21 1036  BP: (!) 148/101 (!) 142/95  (!) 135/97  Pulse: 70 65  64  Resp: 15 16  20   Temp: 97.9 F (36.6 C) 98.8 F (37.1 C)  98 F (36.7 C)  TempSrc: Oral Oral  Oral  SpO2: 99% 99% 98% 100%  Weight: 84.7 kg     Height:        Intake/Output Summary (Last 24 hours) at 07/26/2021 1052 Last data filed at 07/26/2021 1037 Gross per 24 hour  Intake 1022.89 ml  Output 850 ml  Net 172.89 ml   Last 3 Weights 07/26/2021 07/25/2021 07/24/2021  Weight (lbs) 186 lb 12.8 oz 188 lb 11.4 oz 200 lb  Weight (kg) 84.732 kg 85.6 kg 90.719 kg      Telemetry    NSR- Personally Reviewed  ECG    Normal sinus rhythm, rate 83, PVCs, Q waves leads II, III, aVF and V4-6 - Personally Reviewed  Physical Exam   GEN: No acute distress.   Neck: No JVD Cardiac: RRR, no murmurs, rubs, or gallops.  Respiratory: Clear to auscultation bilaterally. GI: Soft, nontender MS: No edema Neuro:  Nonfocal  Psych: Normal affect   Labs    High Sensitivity Troponin:   Recent Labs  Lab 07/24/21 2021 07/24/21 2340  TROPONINIHS 159* 161*      Chemistry Recent Labs  Lab 07/24/21 2021 07/25/21 0248 07/26/21 0238  NA 135 132* 134*  K 4.4 3.9  3.7  CL 101 101 101  CO2 27 23 27   GLUCOSE 110* 93 140*  BUN 19 18 16   CREATININE 1.56* 1.28* 1.33*  CALCIUM 8.2* 7.8* 7.6*  GFRNONAA 47* >60 58*  ANIONGAP 7 8 6      Lipids  Recent Labs  Lab 07/25/21 0248  CHOL 171  TRIG 47  HDL 39*  LDLCALC 123*  CHOLHDL 4.4     Hematology Recent Labs  Lab 07/24/21 2021 07/26/21 0238  WBC 5.4 3.8*  RBC 4.89 4.34  HGB 15.6 14.0  HCT 47.2 40.8  MCV 96.5 94.0  MCH 31.9 32.3  MCHC 33.1 34.3  RDW 15.3 14.8  PLT 111* 103*    Thyroid No results for input(s): TSH, FREET4 in the last 168 hours.  BNP Recent Labs  Lab 07/24/21 2021  BNP 1,111.1*     DDimer No results for input(s): DDIMER in the last 168 hours.   Radiology    DG Chest 2 View  Result Date: 07/24/2021 CLINICAL DATA:  Cough, congestion, sore throat. EXAM: CHEST - 2 VIEW COMPARISON:  06/28/2008. FINDINGS: The heart is enlarged. The mediastinal structures are within normal limits. No consolidation, effusion, or pneumothorax is seen. No  acute osseous abnormality. IMPRESSION: Cardiomegaly with no acute process. Electronically Signed   By: Brett Fairy M.D.   On: 07/24/2021 21:14   CT Angio Chest Pulmonary Embolism (PE) W or WO Contrast  Result Date: 07/25/2021 CLINICAL DATA:  Fever, fatigue.  Evaluate for pulmonary embolism. EXAM: CT ANGIOGRAPHY CHEST WITH CONTRAST TECHNIQUE: Multidetector CT imaging of the chest was performed using the standard protocol during bolus administration of intravenous contrast. Multiplanar CT image reconstructions and MIPs were obtained to evaluate the vascular anatomy. CONTRAST:  164mL OMNIPAQUE IOHEXOL 350 MG/ML SOLN COMPARISON:  None. FINDINGS: Vascular Findings: There is adequate opacification of the pulmonary arterial system with the main pulmonary artery measuring 419 Hounsfield units. There are no discrete filling defects within the pulmonary arterial tree to suggest pulmonary embolism. Borderline enlarged caliber the main pulmonary artery  measuring 33 mm in diameter. Marked cardiomegaly, in particular, there is enlargement of the left ventricle. Coronary artery calcifications. No pericardial effusion though a small amount of fluid is seen within the pericardial recess. Mild fusiform aneurysmal dilatation of the ascending thoracic aorta measuring 43 mm in diameter (image 55, series 7). No evidence of an intramural hematoma. Review of the MIP images confirms the above findings. ---------------------------------------------------------------------------------- Nonvascular Findings: Mediastinum/Lymph Nodes: Evaluation for mediastinal and hilar adenopathy is degraded secondary to tailoring the examination for evaluation of the pulmonary arteries. Scattered mediastinal lymph nodes are mildly prominent though not enlarged by size criteria with index prevascular lymph node measuring 0.9 cm in greatest short axis diameter (image 43, series 8). No definitive bulky mediastinal, hilar or axillary lymphadenopathy. Lungs/Pleura: Circumferential bronchial wall thickening, most conspicuously involving the bilateral lower lobes. Scattered ill-defined somewhat tree-in-bud ground-glass opacities are seen with the right upper (images 50 and 55, series 8) and lower lobes (image 77 and 86, series 8). No associated air bronchograms. Minimal bibasilar ground-glass atelectasis. No pleural effusion or pneumothorax. Apical predominant centrilobular and paraseptal emphysematous change. There is a punctate (3 mm) pulmonary nodule within right middle lobe (image 73, series 8). Upper abdomen: Limited early arterial phase evaluation of the upper abdomen demonstrates isoattenuating bilateral renal masses, the right measuring 5.5 x 4.6 cm (image 129, series 7, and the left measuring 4.8 x 4.1 cm (image 159, series 7). Mild nodularity of the hepatic contour as could be seen in the setting of cirrhotic change. Musculoskeletal: No acute or aggressive osseous abnormalities. Regional soft  tissues appear normal. Normal appearance of the thyroid gland. IMPRESSION: 1. No evidence of pulmonary embolism. 2. Diffuse bronchial wall thickening with scattered ill-defined somewhat tree-in-bud ground-glass opacities within the right upper and lower lobes, nonspecific though could be seen in the setting of early atypical infection superimposed on bronchitis/airways disease. 3. Bilateral isoattenuating renal masses, the right measuring 5.5 cm and the left measuring 3.8 cm, incompletely evaluated on the present examination, though worrisome for renal cell carcinomas. Further evaluation with nonemergent renal protocol CT or MRI could be performed as indicated. 4.  Emphysema (ICD10-J43.9). 5. Punctate (3 mm) right middle lobe pulmonary nodule. Comparison with prior outside examinations is advised. Pending the results of the above recommended dedicated renal imaging, a non-contrast chest CT can be considered in 12 months given patient's emphysematous change. This recommendation follows the consensus statement: Guidelines for Management of Incidental Pulmonary Nodules Detected on CT Images: From the Fleischner Society 2017; Radiology 2017; 284:228-243. 6. 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 Recommend annual imaging followup by CTA or MRA. This recommendation follows 2010 ACCF/AHA/AATS/ACR/ASA/SCA/SCAI/SIR/STS/SVM  Guidelines for the Diagnosis and Management of Patients with Thoracic Aortic Disease. Circulation. 2010; 121: W258-N277. Aortic aneurysm NOS (ICD10-I71.9) 7. Marked cardiomegaly with enlargement of the caliber the main pulmonary artery, nonspecific though could be seen in the setting pulmonary arterial hypertension. Further evaluation with cardiac echo could be performed as indicated. 8. Coronary artery calcifications. Aortic Atherosclerosis (ICD10-I70.0). 9. Mild nodularity hepatic contour as could be seen in the setting of cirrhotic change. Correlation  with LFTs is advised. Electronically Signed   By: Sandi Mariscal M.D.   On: 07/25/2021 12:18   ECHOCARDIOGRAM COMPLETE  Result Date: 07/25/2021    ECHOCARDIOGRAM REPORT   Patient Name:   ZYIERE ROSEMOND Date of Exam: 07/25/2021 Medical Rec #:  [id number]  Height:       70.0 in Accession #:    [id number] Weight:       200.0 lb Date of Birth:  24-Jul-1951  BSA:          2.087 m Patient Age:    70 years   BP:           126/91 mmHg Patient Gender: M          HR:           76 bpm. Exam Location:  Inpatient Procedure: 2D Echo, Cardiac Doppler, Color Doppler and Intracardiac            Opacification Agent STAT ECHO Indications:    Acute ischemic heart disease, chest pain, elevated troponin  History:        Patient has no prior history of Echocardiogram examinations.                 Risk Factors:Hypertension.  Sonographer:    Maudry Mayhew MHA, RDMS, RVT, RDCS Referring Phys: [id number] [place]  1. Left ventricular ejection fraction, by estimation, is 25 to 30%. The left ventricle has severely decreased function. The left ventricle demonstrates regional wall motion abnormalities (see scoring diagram/findings for description). The left ventricular internal cavity size was mildly dilated. There is mild left ventricular hypertrophy. Left ventricular diastolic parameters are consistent with Grade II diastolic dysfunction (pseudonormalization). Elevated left atrial pressure.  2. Large LV apical thrombus measuring 2.5cm x 1.8cm  3. Right ventricular systolic function is moderately reduced. The right ventricular size is moderately enlarged. There is mildly elevated pulmonary artery systolic pressure. The estimated right ventricular systolic pressure is 32.6 mmHg.  4. Right atrial size was mildly dilated.  5. The mitral valve is normal in structure. Trivial mitral valve regurgitation.  6. The aortic valve is tricuspid. Aortic valve regurgitation is not visualized. No aortic stenosis is present.  7. The inferior  vena cava is dilated in size with >50% respiratory variability, suggesting right atrial pressure of 8 mmHg. FINDINGS  Left Ventricle: Left ventricular ejection fraction, by estimation, is 25 to 30%. The left ventricle has severely decreased function. The left ventricle demonstrates regional wall motion abnormalities. Definity contrast agent was given IV to delineate the left ventricular endocardial borders. The left ventricular internal cavity size was mildly dilated. There is mild left ventricular hypertrophy. Left ventricular diastolic parameters are consistent with Grade II diastolic dysfunction (pseudonormalization). Elevated left atrial pressure.  LV Wall Scoring: The mid and distal lateral wall, entire apex, entire inferior wall, and posterior wall are akinetic. The anterior wall, antero-lateral wall, anterior septum, mid inferoseptal segment, and basal inferoseptal segment are normal. Right Ventricle: The right ventricular size is moderately enlarged. Right vetricular wall thickness was not well  visualized. Right ventricular systolic function is moderately reduced. There is mildly elevated pulmonary artery systolic pressure. The tricuspid regurgitant velocity is 2.73 m/s, and with an assumed right atrial pressure of 8 mmHg, the estimated right ventricular systolic pressure is 37.8 mmHg. Left Atrium: Left atrial size was normal in size. Right Atrium: Right atrial size was mildly dilated. Pericardium: There is no evidence of pericardial effusion. Mitral Valve: The mitral valve is normal in structure. Trivial mitral valve regurgitation. Tricuspid Valve: The tricuspid valve is normal in structure. Tricuspid valve regurgitation is trivial. Aortic Valve: The aortic valve is tricuspid. Aortic valve regurgitation is not visualized. No aortic stenosis is present. Aortic valve mean gradient measures 1.0 mmHg. Aortic valve peak gradient measures 3.0 mmHg. Aortic valve area, by VTI measures 2.18 cm. Pulmonic Valve: The  pulmonic valve was not well visualized. Pulmonic valve regurgitation is not visualized. Aorta: The aortic root is normal in size and structure. Venous: The inferior vena cava is dilated in size with greater than 50% respiratory variability, suggesting right atrial pressure of 8 mmHg. IAS/Shunts: The interatrial septum was not well visualized.  LEFT VENTRICLE PLAX 2D LVIDd:         5.80 cm   Diastology LVIDs:         5.30 cm   LV e' medial:    4.53 cm/s LV PW:         1.10 cm   LV E/e' medial:  15.6 LV IVS:        1.20 cm   LV e' lateral:   5.22 cm/s LVOT diam:     1.70 cm   LV E/e' lateral: 13.5 LV SV:         28 LV SV Index:   13 LVOT Area:     2.27 cm  RIGHT VENTRICLE RV S prime:     7.01 cm/s TAPSE (M-mode): 1.1 cm LEFT ATRIUM           Index        RIGHT ATRIUM           Index LA diam:      4.30 cm 2.06 cm/m   RA Area:     22.10 cm LA Vol (A2C): 58.0 ml 27.79 ml/m  RA Volume:   75.50 ml  36.17 ml/m LA Vol (A4C): 63.5 ml 30.42 ml/m  AORTIC VALVE AV Area (Vmax):    1.70 cm AV Area (Vmean):   1.74 cm AV Area (VTI):     2.18 cm AV Vmax:           86.10 cm/s AV Vmean:          50.400 cm/s AV VTI:            0.128 m AV Peak Grad:      3.0 mmHg AV Mean Grad:      1.0 mmHg LVOT Vmax:         64.30 cm/s LVOT Vmean:        38.600 cm/s LVOT VTI:          0.123 m LVOT/AV VTI ratio: 0.96  AORTA Ao Root diam: 3.20 cm MITRAL VALVE               TRICUSPID VALVE MV Area (PHT): 7.51 cm    TR Peak grad:   29.8 mmHg MV Decel Time: 101 msec    TR Vmax:        273.00 cm/s MR Peak grad: 44.5 mmHg MR Vmax:  333.50 cm/s  SHUNTS MV E velocity: 70.70 cm/s  Systemic VTI:  0.12 m MV A velocity: 46.30 cm/s  Systemic Diam: 1.70 cm MV E/A ratio:  1.53 Oswaldo Milian MD Electronically signed by Oswaldo Milian MD Signature Date/Time: 07/25/2021/12:12:47 PM    Final     Cardiac Studies   Echo 07/25/21:  1. Left ventricular ejection fraction, by estimation, is 25 to 30%. The  left ventricle has severely decreased  function. The left ventricle  demonstrates regional wall motion abnormalities (see scoring  diagram/findings for description). The left  ventricular internal cavity size was mildly dilated. There is mild left  ventricular hypertrophy. Left ventricular diastolic parameters are  consistent with Grade II diastolic dysfunction (pseudonormalization).  Elevated left atrial pressure.   2. Large LV apical thrombus measuring 2.5cm x 1.8cm   3. Right ventricular systolic function is moderately reduced. The right  ventricular size is moderately enlarged. There is mildly elevated  pulmonary artery systolic pressure. The estimated right ventricular  systolic pressure is 97.3 mmHg.   4. Right atrial size was mildly dilated.   5. The mitral valve is normal in structure. Trivial mitral valve  regurgitation.   6. The aortic valve is tricuspid. Aortic valve regurgitation is not  visualized. No aortic stenosis is present.   7. The inferior vena cava is dilated in size with >50% respiratory  variability, suggesting right atrial pressure of 8 mmHg.   Patient Profile     70 y.o. male with no known medical history who presents with fevers, chills, shortness of breath, chest pain  Assessment & Plan    Acute combined systolic and diastolic heart failure: Echo shows EF 25 to 53%, grade 2 diastolic dysfunction, large LV apical thrombus, moderate RV dysfunction.  New diagnosis.  Regional wall motion abnormalities (inferior/inferolateral akinesis and apical akinesis) suggest multivessel CAD.  CTPA showed no PE but did note bilateral renal masses worrisome for renal cell carcinomas.  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  Would plan on diagnostic cath only, as would hold off on any stenting given possible renal cell carcinoma.  If obstructive CAD and renal cell carcinoma confirmed, will need to discuss with urology/oncology to determine next steps.   -Risks and benefits  of cardiac catheterization have been discussed with the patient.  These include bleeding, infection, kidney damage, stroke, heart attack, death.  The patient understands these risks and is willing to proceed. -Add carvedilol 6.25 mg twice daily -Continue losartan 50 mg daily.  Plan to transition to [facility] if stable renal function after cath -Plan to add spironolactone and Jardiance prior to discharge  NSTEMI: troponin peak 161.  Denies any chest pain.  Suspect may be demand ischemia in setting of influenza infection and underlying obstructive CAD as above.  Plan LHC as above  LV apical thrombus: Continue heparin drip  Influenza A infection: Treatment per primary team.  AKI: Creatinine 1.56 yesterday, improved to 1.28 with IV fluids.  Would be cautious about giving more IV fluids given his low EF.  Creatinine stable 1.33 today  Possible RCC: noted on CT chest.  MRI abdomen pending for further evaluation  Hypertension: BP remains elevated.  On losartan 50 mg daily, add carvedilol 6.25 mg twice daily   For questions or updates, please contact [place] Please consult [email or web address] for contact info under        Signed, Donato Heinz, MD  07/26/2021, 10:52 AM

## 2021-07-26 NOTE — Progress Notes (Signed)
ANTICOAGULATION CONSULT NOTE - Follow Up Consult  Pharmacy Consult for heparin Indication: apical thrombus on ECHO  No Known Allergies  Patient Measurements: Height: 5\' 10"  (177.8 cm) Weight: 84.7 kg (186 lb 12.8 oz) IBW/kg (Calculated) : 73 Heparin Dosing Weight: 90.7 kg  Vital Signs: Temp: 98.1 F (36.7 C) (11/27 1601) Temp Source: Oral (11/27 1601) BP: 136/98 (11/27 1601) Pulse Rate: 60 (11/27 1601)  Labs: Recent Labs    07/24/21 2021 07/24/21 2340 07/25/21 0248 07/25/21 1622 07/26/21 0238 07/26/21 1046 07/26/21 1807  HGB 15.6  --   --   --  14.0  --   --   HCT 47.2  --   --   --  40.8  --   --   PLT 111*  --   --   --  103*  --   --   HEPARINUNFRC  --   --   --    < > 0.90* 0.60 0.54  CREATININE 1.56*  --  1.28*  --  1.33*  --   --   TROPONINIHS 159* 161*  --   --   --   --   --    < > = values in this interval not displayed.     Estimated Creatinine Clearance: 53.4 mL/min (A) (by C-G formula based on SCr of 1.33 mg/dL (H)).   Medical History: History reviewed. No pertinent past medical history.  Assessment:  70 yo M with new apical thrombus on ECHO. Was on lovenox 90mg  BID (treatment dosing) started by hospitalist team for NSTEMI. Last lovenox dose this AM @ 0224. Cardiology consulted - possible cath lab this week Heparin drip 1100 uts/hr HL 0.54 at goal    Goal of Therapy:  Heparin level 0.3-0.7 units/ml Monitor platelets by anticoagulation protocol: Yes   Plan:  Continue heparin drip 1100 uts/hr  Daily heparin level and CBC    Bonnita Nasuti Pharm.D. CPP, BCPS Clinical Pharmacist [phone] 07/26/2021 7:22 PM

## 2021-07-27 ENCOUNTER — Inpatient Hospital Stay: Payer: Self-pay

## 2021-07-27 ENCOUNTER — Inpatient Hospital Stay (HOSPITAL_COMMUNITY)
Admission: EM | Disposition: A | Discharge status: Home / Self Care | Payer: Self-pay | Source: Home / Self Care | Attending: Internal Medicine

## 2021-07-27 ENCOUNTER — Encounter (HOSPITAL_COMMUNITY): Payer: Self-pay | Admitting: Internal Medicine

## 2021-07-27 DIAGNOSIS — I2510 Atherosclerotic heart disease of native coronary artery without angina pectoris: Secondary | ICD-10-CM | POA: Diagnosis not present

## 2021-07-27 DIAGNOSIS — I5021 Acute systolic (congestive) heart failure: Secondary | ICD-10-CM

## 2021-07-27 DIAGNOSIS — I5082 Biventricular heart failure: Secondary | ICD-10-CM | POA: Diagnosis not present

## 2021-07-27 DIAGNOSIS — I214 Non-ST elevation (NSTEMI) myocardial infarction: Secondary | ICD-10-CM | POA: Diagnosis not present

## 2021-07-27 HISTORY — PX: RIGHT/LEFT HEART CATH AND CORONARY ANGIOGRAPHY: CATH118266

## 2021-07-27 LAB — CBC
HCT: 41.7 % (ref 39.0–52.0)
Hemoglobin: 14.3 g/dL (ref 13.0–17.0)
MCH: 32 pg (ref 26.0–34.0)
MCHC: 34.3 g/dL (ref 30.0–36.0)
MCV: 93.3 fL (ref 80.0–100.0)
Platelets: 102 10*3/uL — ABNORMAL LOW (ref 150–400)
RBC: 4.47 MIL/uL (ref 4.22–5.81)
RDW: 14.6 % (ref 11.5–15.5)
WBC: 4.5 10*3/uL (ref 4.0–10.5)
nRBC: 0 % (ref 0.0–0.2)

## 2021-07-27 LAB — BASIC METABOLIC PANEL
Anion gap: 8 (ref 5–15)
BUN: 17 mg/dL (ref 8–23)
CO2: 23 mmol/L (ref 22–32)
Calcium: 7.9 mg/dL — ABNORMAL LOW (ref 8.9–10.3)
Chloride: 104 mmol/L (ref 98–111)
Creatinine, Ser: 1.3 mg/dL — ABNORMAL HIGH (ref 0.61–1.24)
GFR, Estimated: 59 mL/min — ABNORMAL LOW (ref >60)
Glucose, Bld: 91 mg/dL (ref 70–99)
Potassium: 4.2 mmol/L (ref 3.5–5.1)
Sodium: 135 mmol/L (ref 135–145)

## 2021-07-27 LAB — COOXEMETRY PANEL
Carboxyhemoglobin: 1.2 % (ref 0.5–1.5)
Carboxyhemoglobin: 1.3 % (ref 0.5–1.5)
Methemoglobin: 0.9 % (ref 0.0–1.5)
Methemoglobin: 0.7 % (ref 0.0–1.5)
O2 Saturation: 76.7 %
O2 Saturation: 52.3 %
Total hemoglobin: 13.5 g/dL (ref 12.0–16.0)
Total hemoglobin: 13.3 g/dL (ref 12.0–16.0)

## 2021-07-27 LAB — HEPATIC FUNCTION PANEL
ALT: 33 U/L (ref 0–44)
AST: 43 U/L — ABNORMAL HIGH (ref 15–41)
Albumin: 2.6 g/dL — ABNORMAL LOW (ref 3.5–5.0)
Alkaline Phosphatase: 56 U/L (ref 38–126)
Bilirubin, Direct: 0.1 mg/dL (ref 0.0–0.2)
Indirect Bilirubin: 0.7 mg/dL (ref 0.3–0.9)
Total Bilirubin: 0.8 mg/dL (ref 0.3–1.2)
Total Protein: 5.2 g/dL — ABNORMAL LOW (ref 6.5–8.1)

## 2021-07-27 LAB — POCT I-STAT EG7
Acid-Base Excess: 1 mmol/L (ref 0.0–2.0)
Acid-Base Excess: 1 mmol/L (ref 0.0–2.0)
Bicarbonate: 26.4 mmol/L (ref 20.0–28.0)
Bicarbonate: 26.6 mmol/L (ref 20.0–28.0)
Calcium, Ion: 1.07 mmol/L — ABNORMAL LOW (ref 1.15–1.40)
Calcium, Ion: 1.12 mmol/L — ABNORMAL LOW (ref 1.15–1.40)
HCT: 41 % (ref 39.0–52.0)
HCT: 42 % (ref 39.0–52.0)
Hemoglobin: 13.9 g/dL (ref 13.0–17.0)
Hemoglobin: 14.3 g/dL (ref 13.0–17.0)
O2 Saturation: 54 %
O2 Saturation: 59 %
Potassium: 4 mmol/L (ref 3.5–5.1)
Potassium: 4.2 mmol/L (ref 3.5–5.1)
Sodium: 138 mmol/L (ref 135–145)
Sodium: 138 mmol/L (ref 135–145)
TCO2: 28 mmol/L (ref 22–32)
TCO2: 28 mmol/L (ref 22–32)
pCO2, Ven: 45.2 mmHg (ref 44.0–60.0)
pCO2, Ven: 45.7 mmHg (ref 44.0–60.0)
pH, Ven: 7.374 (ref 7.250–7.430)
pH, Ven: 7.374 (ref 7.250–7.430)
pO2, Ven: 29 mmHg — CL (ref 32.0–45.0)
pO2, Ven: 32 mmHg (ref 32.0–45.0)

## 2021-07-27 LAB — POCT I-STAT 7, (LYTES, BLD GAS, ICA,H+H)
Acid-base deficit: 1 mmol/L (ref 0.0–2.0)
Bicarbonate: 22.9 mmol/L (ref 20.0–28.0)
Calcium, Ion: 1.03 mmol/L — ABNORMAL LOW (ref 1.15–1.40)
HCT: 40 % (ref 39.0–52.0)
Hemoglobin: 13.6 g/dL (ref 13.0–17.0)
O2 Saturation: 95 %
Potassium: 3.9 mmol/L (ref 3.5–5.1)
Sodium: 138 mmol/L (ref 135–145)
TCO2: 24 mmol/L (ref 22–32)
pCO2 arterial: 36 mmHg (ref 32.0–48.0)
pH, Arterial: 7.413 (ref 7.350–7.450)
pO2, Arterial: 73 mmHg — ABNORMAL LOW (ref 83.0–108.0)

## 2021-07-27 LAB — MAGNESIUM: Magnesium: 2 mg/dL (ref 1.7–2.4)

## 2021-07-27 LAB — HEPARIN LEVEL (UNFRACTIONATED): Heparin Unfractionated: 0.43 IU/mL (ref 0.30–0.70)

## 2021-07-27 LAB — PHOSPHORUS: Phosphorus: 4 mg/dL (ref 2.5–4.6)

## 2021-07-27 LAB — POCT ACTIVATED CLOTTING TIME: Activated Clotting Time: 179 seconds

## 2021-07-27 SURGERY — RIGHT/LEFT HEART CATH AND CORONARY ANGIOGRAPHY
Anesthesia: LOCAL

## 2021-07-27 MED ORDER — HEPARIN (PORCINE) 25000 UT/250ML-% IV SOLN
1250.0000 [IU]/h | INTRAVENOUS | Status: DC
  Administered 2021-07-27: 20:00:00 1100 [IU]/h via INTRAVENOUS
  Filled 2021-07-27: qty 250

## 2021-07-27 MED ORDER — HEPARIN SODIUM (PORCINE) 1000 UNIT/ML IJ SOLN
INTRAMUSCULAR | Status: DC | PRN
  Administered 2021-07-27: 4000 [IU] via INTRAVENOUS

## 2021-07-27 MED ORDER — SODIUM CHLORIDE 0.9% FLUSH: 3.0000 mL | INTRAVENOUS | Status: DC | PRN

## 2021-07-27 MED ORDER — SODIUM CHLORIDE 0.9% FLUSH
10.0000 mL | Freq: Two times a day (BID) | INTRAVENOUS | Status: DC
  Administered 2021-07-27 – 2021-07-29 (×4): 10 mL

## 2021-07-27 MED ORDER — ATORVASTATIN CALCIUM 80 MG PO TABS
80.0000 mg | ORAL_TABLET | Freq: Every day | ORAL | Status: DC
  Administered 2021-07-27 – 2021-07-29 (×3): 80 mg via ORAL
  Filled 2021-07-27 (×3): qty 1

## 2021-07-27 MED ORDER — LIDOCAINE HCL (PF) 1 % IJ SOLN
INTRAMUSCULAR | Status: DC | PRN
  Administered 2021-07-27: 5 mL

## 2021-07-27 MED ORDER — MIDAZOLAM HCL 2 MG/2ML IJ SOLN
INTRAMUSCULAR | Status: AC
  Filled 2021-07-27: qty 2

## 2021-07-27 MED ORDER — MIDAZOLAM HCL 2 MG/2ML IJ SOLN
INTRAMUSCULAR | Status: DC | PRN
  Administered 2021-07-27 (×3): 1 mg via INTRAVENOUS

## 2021-07-27 MED ORDER — VERAPAMIL HCL 2.5 MG/ML IV SOLN: INTRAVENOUS | Status: DC | PRN

## 2021-07-27 MED ORDER — SODIUM CHLORIDE 0.9 % IV SOLN: 250.0000 mL | INTRAVENOUS | Status: DC | PRN

## 2021-07-27 MED ORDER — HEPARIN SODIUM (PORCINE) 1000 UNIT/ML IJ SOLN
INTRAMUSCULAR | Status: AC
  Filled 2021-07-27: qty 10

## 2021-07-27 MED ORDER — SODIUM CHLORIDE 0.9 % IV SOLN
INTRAVENOUS | Status: DC | PRN
  Administered 2021-07-27: 20 mL/h via INTRAVENOUS

## 2021-07-27 MED ORDER — VERAPAMIL HCL 2.5 MG/ML IV SOLN
INTRAVENOUS | Status: AC
  Filled 2021-07-27: qty 2

## 2021-07-27 MED ORDER — VERAPAMIL HCL 2.5 MG/ML IV SOLN: INTRA_ARTERIAL | Status: DC | PRN

## 2021-07-27 MED ORDER — HEPARIN (PORCINE) IN NACL 1000-0.9 UT/500ML-% IV SOLN
INTRAVENOUS | Status: AC
  Filled 2021-07-27: qty 1000

## 2021-07-27 MED ORDER — LIDOCAINE HCL (PF) 1 % IJ SOLN
INTRAMUSCULAR | Status: AC
  Filled 2021-07-27: qty 30

## 2021-07-27 MED ORDER — FENTANYL CITRATE (PF) 100 MCG/2ML IJ SOLN
INTRAMUSCULAR | Status: AC
  Filled 2021-07-27: qty 2

## 2021-07-27 MED ORDER — SODIUM CHLORIDE 0.9 % IV SOLN: INTRAVENOUS | Status: AC

## 2021-07-27 MED ORDER — EMPAGLIFLOZIN 10 MG PO TABS
10.0000 mg | ORAL_TABLET | Freq: Every day | ORAL | Status: DC
  Administered 2021-07-28 – 2021-07-29 (×2): 10 mg via ORAL
  Filled 2021-07-27 (×2): qty 1

## 2021-07-27 MED ORDER — SODIUM CHLORIDE 0.9% FLUSH
3.0000 mL | Freq: Two times a day (BID) | INTRAVENOUS | Status: DC
  Administered 2021-07-27: 17:00:00 3 mL via INTRAVENOUS

## 2021-07-27 MED ORDER — IOHEXOL 350 MG/ML SOLN
INTRAVENOUS | Status: DC | PRN
  Administered 2021-07-27: 12:00:00 70 mL via INTRACARDIAC

## 2021-07-27 MED ORDER — FUROSEMIDE 10 MG/ML IJ SOLN
80.0000 mg | Freq: Two times a day (BID) | INTRAMUSCULAR | Status: DC
  Administered 2021-07-27 – 2021-07-29 (×4): 80 mg via INTRAVENOUS
  Filled 2021-07-27 (×4): qty 8

## 2021-07-27 MED ORDER — SODIUM CHLORIDE 0.9% FLUSH: 10.0000 mL | INTRAVENOUS | Status: DC | PRN

## 2021-07-27 MED ORDER — CHLORHEXIDINE GLUCONATE CLOTH 2 % EX PADS
6.0000 | MEDICATED_PAD | Freq: Every day | CUTANEOUS | Status: DC
  Administered 2021-07-27 – 2021-07-29 (×3): 6 via TOPICAL

## 2021-07-27 MED ORDER — FENTANYL CITRATE (PF) 100 MCG/2ML IJ SOLN
INTRAMUSCULAR | Status: DC | PRN
  Administered 2021-07-27 (×3): 25 ug via INTRAVENOUS

## 2021-07-27 MED ORDER — HYDRALAZINE HCL 20 MG/ML IJ SOLN: 10.0000 mg | INTRAMUSCULAR | Status: AC | PRN

## 2021-07-27 SURGICAL SUPPLY — 22 items
CATH BALLN WEDGE 5F 110CM (CATHETERS) ×1 IMPLANT
CATH IMPULSE 5F ANG/FL3.5 (CATHETERS) ×1 IMPLANT
CATH INFINITI 5 FR 3DRC (CATHETERS) ×1 IMPLANT
CATH INFINITI 5 FR AR1 MOD (CATHETERS) ×1 IMPLANT
CATH INFINITI 5 FR AR2 MOD (CATHETERS) ×1 IMPLANT
CATH INFINITI 5 FR LCB (CATHETERS) ×1 IMPLANT
CATH INFINITI 5FR AL1 (CATHETERS) ×1 IMPLANT
CATH LAUNCHER 5F RADR (CATHETERS) IMPLANT
CATH SWAN DBL LUMAN 5F 110 (CATHETERS) ×1 IMPLANT
CATHETER LAUNCHER 5F RADR (CATHETERS) ×2
CLOSURE MYNX CONTROL 5F (Vascular Products) ×1 IMPLANT
DEVICE RAD COMP TR BAND LRG (VASCULAR PRODUCTS) ×1 IMPLANT
GLIDESHEATH SLEND SS 6F .021 (SHEATH) ×1 IMPLANT
GUIDEWIRE .025 260CM (WIRE) ×1 IMPLANT
GUIDEWIRE INQWIRE 1.5J.035X260 (WIRE) IMPLANT
INQWIRE 1.5J .035X260CM (WIRE) ×2
KIT HEART LEFT (KITS) ×1 IMPLANT
PACK CARDIAC CATHETERIZATION (CUSTOM PROCEDURE TRAY) ×2 IMPLANT
SHEATH GLIDE SLENDER 4/5FR (SHEATH) ×1 IMPLANT
SHEATH PINNACLE 5F 10CM (SHEATH) ×1 IMPLANT
TRANSDUCER W/STOPCOCK (MISCELLANEOUS) ×2 IMPLANT
WIRE EMERALD 3MM-J .035X150CM (WIRE) ×1 IMPLANT

## 2021-07-27 NOTE — Progress Notes (Addendum)
ANTICOAGULATION CONSULT NOTE - Follow Up Consult  Pharmacy Consult for heparin Indication: apical thrombus on ECHO  No Known Allergies  Patient Measurements: Height: 5\' 10"  (177.8 cm) Weight: 85.1 kg (187 lb 9.6 oz) IBW/kg (Calculated) : 73 Heparin Dosing Weight: 90.7 kg  Vital Signs: Temp: 98.2 F (36.8 C) (11/28 0558) Temp Source: Oral (11/28 0558) BP: 157/97 (11/28 0558) Pulse Rate: 60 (11/28 0558)  Labs: Recent Labs    07/24/21 2021 07/24/21 2340 07/25/21 0248 07/25/21 1622 07/26/21 0238 07/26/21 1046 07/26/21 1807 07/27/21 0257  HGB 15.6  --   --   --  14.0  --   --  14.3  HCT 47.2  --   --   --  40.8  --   --  41.7  PLT 111*  --   --   --  103*  --   --  102*  HEPARINUNFRC  --   --   --    < > 0.90* 0.60 0.54 0.43  CREATININE 1.56*  --  1.28*  --  1.33*  --   --  1.30*  TROPONINIHS 159* 161*  --   --   --   --   --   --    < > = values in this interval not displayed.     Estimated Creatinine Clearance: 54.6 mL/min (A) (by C-G formula based on SCr of 1.3 mg/dL (H)).   Medical History: History reviewed. No pertinent past medical history.  Assessment:  70 yo M with new apical thrombus on ECHO. Was on lovenox 90mg  BID (treatment dosing) started by hospitalist team for NSTEMI. Last lovenox dose this AM @ 0224. Cardiology consulted - possible cath lab this week  Heparin level continues to be therapeutic. We will f/u plan for anticoagulation post cath for apical thrombus.   Goal of Therapy:  Heparin level 0.3-0.7 units/ml Monitor platelets by anticoagulation protocol: Yes   Plan:  Continue heparin drip 1100 uts/hr  Daily heparin level and CBC    Onnie Boer, PharmD, BCIDP, AAHIVP, CPP Infectious Disease Pharmacist 07/27/2021 7:30 AM    Addendum  Pt is s/p cath with severe 2VCAD. Plan for cardiac MRI. Ok to resume heparin 8 hr post sheath removal.  Resume heparin 1100 units/hr at 2000 tonight Check heparin level in AM  Onnie Boer, PharmD, [place],  AAHIVP, CPP Infectious Disease Pharmacist 07/27/2021 12:42 PM

## 2021-07-27 NOTE — H&P (View-Only) (Signed)
Progress Note  Patient Name: Troy May Date of Encounter: 07/27/2021  [facility] Cardiologist: New to [facility]  Subjective   No chest pain, or cough today.  Notes that he things the flu caused most of his symptoms.   Inpatient Medications    Scheduled Meds:  aspirin EC  81 mg Oral Daily   carvedilol  6.25 mg Oral BID WC   guaiFENesin  1,200 mg Oral BID   losartan  50 mg Oral Daily   oseltamivir  30 mg Oral BID   sodium chloride flush  3 mL Intravenous Q12H   Continuous Infusions:  sodium chloride     sodium chloride 10 mL/hr at 07/27/21 0603   heparin 1,100 Units/hr (07/27/21 0114)   PRN Meds: sodium chloride, acetaminophen **OR** acetaminophen, ipratropium-albuterol, ondansetron **OR** ondansetron (ZOFRAN) IV, senna-docusate, sodium chloride flush, sodium chloride HYPERTONIC   Vital Signs    Vitals:   07/26/21 1601 07/26/21 2146 07/27/21 0046 07/27/21 0558  BP: (!) 136/98 (!) 142/106 (!) 142/107 (!) 157/97  Pulse: 60 64 63 60  Resp: 18 15 16 15   Temp: 98.1 F (36.7 C) 98 F (36.7 C) 98.3 F (36.8 C) 98.2 F (36.8 C)  TempSrc: Oral Oral Oral Oral  SpO2: 99% 100% 97% 99%  Weight:    85.1 kg  Height:        Intake/Output Summary (Last 24 hours) at 07/27/2021 0949 Last data filed at 07/27/2021 0731 Gross per 24 hour  Intake 1377.85 ml  Output 250 ml  Net 1127.85 ml   Last 3 Weights 07/27/2021 07/26/2021 07/25/2021  Weight (lbs) 187 lb 9.6 oz 186 lb 12.8 oz 188 lb 11.4 oz  Weight (kg) 85.095 kg 84.732 kg 85.6 kg      Telemetry    SR- Personally Reviewed  ECG    No new last 24 - Personally Reviewed  Physical Exam   Gen: no distress, well nourished   Neck: No JVD Cardiac: No Rubs or Gallops, no Murmur, regular rate+2 radial pulses Respiratory: Clear to auscultation bilaterally, normal effort, normal  respiratory rate GI: Soft, nontender, non-distended  MS: No  edema;  moves all extremities Integument: Skin feels warm Neuro:  At time of  evaluation, alert and oriented to person/place/time/situation  Psych: Normal affect, patient feels OK, a bit overwhelmed   Labs    High Sensitivity Troponin:   Recent Labs  Lab 07/24/21 2021 07/24/21 2340  TROPONINIHS 159* 161*     Chemistry Recent Labs  Lab 07/25/21 0248 07/26/21 0238 07/27/21 0257  NA 132* 134* 135  K 3.9 3.7 4.2  CL 101 101 104  CO2 23 27 23   GLUCOSE 93 140* 91  BUN 18 16 17   CREATININE 1.28* 1.33* 1.30*  CALCIUM 7.8* 7.6* 7.9*  MG  --   --  2.0  PROT  --   --  5.2*  ALBUMIN  --   --  2.6*  AST  --   --  43*  ALT  --   --  33  ALKPHOS  --   --  56  BILITOT  --   --  0.8  GFRNONAA >60 58* 59*  ANIONGAP 8 6 8     Lipids  Recent Labs  Lab 07/25/21 0248  CHOL 171  TRIG 47  HDL 39*  LDLCALC 123*  CHOLHDL 4.4    Hematology Recent Labs  Lab 07/24/21 2021 07/26/21 0238 07/27/21 0257  WBC 5.4 3.8* 4.5  RBC 4.89 4.34 4.47  HGB 15.6  14.0 14.3  HCT 47.2 40.8 41.7  MCV 96.5 94.0 93.3  MCH 31.9 32.3 32.0  MCHC 33.1 34.3 34.3  RDW 15.3 14.8 14.6  PLT 111* 103* 102*   Thyroid No results for input(s): TSH, FREET4 in the last 168 hours.  BNP Recent Labs  Lab 07/24/21 2021  BNP 1,111.1*    DDimer No results for input(s): DDIMER in the last 168 hours.   Radiology    CT Angio Chest Pulmonary Embolism (PE) W or WO Contrast  Result Date: 07/25/2021 CLINICAL DATA:  Fever, fatigue.  Evaluate for pulmonary embolism. EXAM: CT ANGIOGRAPHY CHEST WITH CONTRAST TECHNIQUE: Multidetector CT imaging of the chest was performed using the standard protocol during bolus administration of intravenous contrast. Multiplanar CT image reconstructions and MIPs were obtained to evaluate the vascular anatomy. CONTRAST:  113mL OMNIPAQUE IOHEXOL 350 MG/ML SOLN COMPARISON:  None. FINDINGS: Vascular Findings: There is adequate opacification of the pulmonary arterial system with the main pulmonary artery measuring 419 Hounsfield units. There are no discrete filling  defects within the pulmonary arterial tree to suggest pulmonary embolism. Borderline enlarged caliber the main pulmonary artery measuring 33 mm in diameter. Marked cardiomegaly, in particular, there is enlargement of the left ventricle. Coronary artery calcifications. No pericardial effusion though a small amount of fluid is seen within the pericardial recess. Mild fusiform aneurysmal dilatation of the ascending thoracic aorta measuring 43 mm in diameter (image 55, series 7). No evidence of an intramural hematoma. Review of the MIP images confirms the above findings. ---------------------------------------------------------------------------------- Nonvascular Findings: Mediastinum/Lymph Nodes: Evaluation for mediastinal and hilar adenopathy is degraded secondary to tailoring the examination for evaluation of the pulmonary arteries. Scattered mediastinal lymph nodes are mildly prominent though not enlarged by size criteria with index prevascular lymph node measuring 0.9 cm in greatest short axis diameter (image 43, series 8). No definitive bulky mediastinal, hilar or axillary lymphadenopathy. Lungs/Pleura: Circumferential bronchial wall thickening, most conspicuously involving the bilateral lower lobes. Scattered ill-defined somewhat tree-in-bud ground-glass opacities are seen with the right upper (images 50 and 55, series 8) and lower lobes (image 77 and 86, series 8). No associated air bronchograms. Minimal bibasilar ground-glass atelectasis. No pleural effusion or pneumothorax. Apical predominant centrilobular and paraseptal emphysematous change. There is a punctate (3 mm) pulmonary nodule within right middle lobe (image 73, series 8). Upper abdomen: Limited early arterial phase evaluation of the upper abdomen demonstrates isoattenuating bilateral renal masses, the right measuring 5.5 x 4.6 cm (image 129, series 7, and the left measuring 4.8 x 4.1 cm (image 159, series 7). Mild nodularity of the hepatic contour  as could be seen in the setting of cirrhotic change. Musculoskeletal: No acute or aggressive osseous abnormalities. Regional soft tissues appear normal. Normal appearance of the thyroid gland. IMPRESSION: 1. No evidence of pulmonary embolism. 2. Diffuse bronchial wall thickening with scattered ill-defined somewhat tree-in-bud ground-glass opacities within the right upper and lower lobes, nonspecific though could be seen in the setting of early atypical infection superimposed on bronchitis/airways disease. 3. Bilateral isoattenuating renal masses, the right measuring 5.5 cm and the left measuring 3.8 cm, incompletely evaluated on the present examination, though worrisome for renal cell carcinomas. Further evaluation with nonemergent renal protocol CT or MRI could be performed as indicated. 4.  Emphysema (ICD10-J43.9). 5. Punctate (3 mm) right middle lobe pulmonary nodule. Comparison with prior outside examinations is advised. Pending the results of the above recommended dedicated renal imaging, a non-contrast chest CT can be considered in 12 months given patient's emphysematous change.  This recommendation follows the consensus statement: Guidelines for Management of Incidental Pulmonary Nodules Detected on CT Images: From the Fleischner Society 2017; Radiology 2017; 284:228-243. 6. 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 Recommend annual imaging followup by CTA or MRA. This recommendation follows 2010 ACCF/AHA/AATS/ACR/ASA/SCA/SCAI/SIR/STS/SVM Guidelines for the Diagnosis and Management of Patients with Thoracic Aortic Disease. Circulation. 2010; 121: E993-Z169. Aortic aneurysm NOS (ICD10-I71.9) 7. Marked cardiomegaly with enlargement of the caliber the main pulmonary artery, nonspecific though could be seen in the setting pulmonary arterial hypertension. Further evaluation with cardiac echo could be performed as indicated. 8. Coronary artery calcifications. Aortic  Atherosclerosis (ICD10-I70.0). 9. Mild nodularity hepatic contour as could be seen in the setting of cirrhotic change. Correlation with LFTs is advised. Electronically Signed   By: Sandi Mariscal M.D.   On: 07/25/2021 12:18   MR ABDOMEN W WO CONTRAST  Result Date: 07/26/2021 CLINICAL DATA:  70 year old male with history of renal mass. EXAM: MRI ABDOMEN WITHOUT AND WITH CONTRAST TECHNIQUE: Multiplanar multisequence MR imaging of the abdomen was performed both before and after the administration of intravenous contrast. CONTRAST:  84mL GADAVIST GADOBUTROL 1 MMOL/ML IV SOLN, 8.64mL GADAVIST GADOBUTROL 1 MMOL/ML IV SOLN COMPARISON:  No prior abdominal MRI.  Chest CTA 07/25/2021. FINDINGS: Comment: Portions of today's examination are severely limited by large amount of patient respiratory motion. Lower chest: Cardiomegaly with left ventricular dilatation, rounding of the apex of the left ventricle, and focal area of increased T1 signal intensity in the apex of the left ventricle, corresponding to a filling defect on post gadolinium images, indicative of intracardiac thrombus. Hepatobiliary: Liver has a slightly shrunken appearance and nodular contour, indicative of underlying cirrhosis. No discrete cystic or solid hepatic lesions. No intra or extrahepatic biliary ductal dilatation. Gallbladder is normal in appearance. Pancreas: No pancreatic mass. No pancreatic ductal dilatation. No pancreatic or peripancreatic fluid collections or inflammatory changes. Spleen:  Unremarkable. Adrenals/Urinary Tract: The lesions of concern in the kidneys bilaterally are heterogeneous in signal intensity. The largest of these lesions is in the lateral aspect of the interpolar region of the right kidney (axial image 78 of series 1600) measuring 5.6 x 5.0 cm, predominantly T1 hyperintense and T2 hypointense with no definitive internal enhancement on post gadolinium imaging (although subtraction imaging is severely limited by variable degrees  of inspiration). The smaller lesion in the posterior aspect of the interpolar region of the left kidney measures 3.9 x 3.8 cm (axial image 85 of series 1600) and is more heterogeneous in T1 signal intensity with some areas of T1 hyperintensity, predominantly T2 hypointense, with equivocal low-level enhancement on post gadolinium imaging. Importantly, however, there is a smaller lesion in the anterior aspect of the lower pole of the right kidney (axial image 92 of series 1600) which measures 2.2 x 1.9 cm and is T1 hypointense, poorly visualized on T2 weighted images but predominantly mildly T2 hypointense, and demonstrates some low-level post gadolinium enhancement, concerning for neoplasm. These lesions are all encapsulated within Gerota's fascia and are separated from the renal veins which are widely patent bilaterally. No hydroureteronephrosis. Bilateral adrenal glands are normal in appearance. Stomach/Bowel: Visualized portions are unremarkable. Vascular/Lymphatic: Aortic atherosclerosis with fusiform aneurysmal dilatation of the infrarenal abdominal aorta which measures up to 3 cm in diameter in the visualized portions of the abdomen. No definite lymphadenopathy confidently identified in the abdomen. Other: No significant volume of ascites noted in the visualized portions of the peritoneal cavity. Musculoskeletal: No aggressive appearing osseous lesions  are noted in the visualized portions of the skeleton. IMPRESSION: 1. 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 In addition, there is a larger lesion in the lateral aspect of the interpolar region of the right kidney which is favored to represent a large hemorrhagic cyst. Urologic consultation is recommended. 2. Intracardiac thrombus in the apex of the left ventricle, which places the patient at risk for systemic embolization. Consultation with  Cardiology for clinical management is recommended. 3. Cirrhosis. 4. Aortic atherosclerosis with fusiform aneurysmal dilatation of the infrarenal abdominal aorta which measures up to 3.0 cm in diameter. These results will be called to the ordering clinician or representative by the Radiologist Assistant, and communication documented in the PACS or [employer]. Electronically Signed   By: Vinnie Langton M.D.   On: 07/26/2021 11:07    Cardiac Studies   Echo 07/25/21:  1. Left ventricular ejection fraction, by estimation, is 25 to 30%. The  left ventricle has severely decreased function. The left ventricle  demonstrates regional wall motion abnormalities (see scoring  diagram/findings for description). The left  ventricular internal cavity size was mildly dilated. There is mild left  ventricular hypertrophy. Left ventricular diastolic parameters are  consistent with Grade II diastolic dysfunction (pseudonormalization).  Elevated left atrial pressure.   2. Large LV apical thrombus measuring 2.5cm x 1.8cm   3. Right ventricular systolic function is moderately reduced. The right  ventricular size is moderately enlarged. There is mildly elevated  pulmonary artery systolic pressure. The estimated right ventricular  systolic pressure is 33.3 mmHg.   4. Right atrial size was mildly dilated.   5. The mitral valve is normal in structure. Trivial mitral valve  regurgitation.   6. The aortic valve is tricuspid. Aortic valve regurgitation is not  visualized. No aortic stenosis is present.   7. The inferior vena cava is dilated in size with >50% respiratory  variability, suggesting right atrial pressure of 8 mmHg.   Patient Profile     70 y.o. male with no known medical history who presents with fevers, chills, shortness of breath.  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  Assessment & Plan    Acute on Chronic HF LV Thrombus Influenza A s/p Tamiflu RCC by MRI AKI through admission NSTEMI - will need LHC and RHC and planned today:  ultimately will need urology eval as well.  We will need to evaluate timing of interventions with urology if he does have multi-vessel disease -on carvedilol 6.25 mg twice daily -Continue losartan 50 mg daily.  Plan to transition to [facility] if stable renal function after cath - presently with AKI, this improved with IVD, presently euvolemic - will attempt jardiance today, MRA may not be added until outpatient - on heparin drip and ASA - have reviewed the MRI report with patient and family in brief  For questions or updates, please contact [facility] Please consult [email or web address] for contact info under        Signed, Werner Lean, MD  07/27/2021, 9:49 AM

## 2021-07-27 NOTE — Consult Note (Addendum)
Advanced Heart Failure Team Consult Note   Primary Physician: Pcp, No PCP-Cardiologist:  Dr. Gardiner Rhyme (new)  Reason for Consultation: Acute Biventricular Heart Failure/ Ischemic CM w/ Low Output  HPI:    Troy May is seen today for evaluation of acute biventricular heart failure/ischemic CM w/ low output at the request of Dr. Gasper Sells, Cardiology.   70 y/o male w/ no PMH, who presented to ED on 11/25 w/ complaints of generalized malaise, fatigue, cough and pleuritic CP x 2-3 days. Influenza A+. COVID negative. Hypertensive on arrival requiring labetalol gtt. HS trop elevated 159>>161. EKG w/ nonspecific ST abnormalities. CXR showed cardiomegaly but no acute process. CT of chest negative for PE but + for cardiomegaly, coronary artery calcifications, mild fusiform aneurysmal dilatation of the ascending thoracic aorta measuring 43 mm in diameter and incidental finding of bilateral renal masses, right measuring 5.5 cm and the left measuring 3.8 cm, worrisome for renal cell carcinomas. F/u MR of abdomen also suggestive of RCC.   Cardiology was consulted for chest pain and elevated HS trop. Subsequently, an echocardiogram was obtained showing severe biventricular dysfunction. LVEF severely reduced, 25-30% w/ large LV apical thrombus measuring 2.5cm x 1.8 cm, GIIDD. RV moderately reduced. 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     [facility] 07/27/21   Ost RCA to Prox RCA lesion is 100% stenosed.   Prox Cx to Mid Cx lesion is 30% stenosed.   1st Mrg lesion is 20% stenosed.   Prox LAD lesion is 60% stenosed.   Mid LAD lesion is 90% stenosed.   Mid LAD to Dist LAD lesion is 100% stenosed.   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 The left ventricle has severely decreased function. The left ventricle demonstrates regional wall motion abnormalities (see scoring diagram/findings for description). The left ventricular internal cavity size was mildly dilated. There is mild left ventricular hypertrophy. Left ventricular diastolic parameters are consistent with Grade II diastolic dysfunction (pseudonormalization). Elevated left atrial pressure. 1. 2. Large LV apical thrombus measuring 2.5cm x 1.8cm Right ventricular systolic function is moderately reduced. The right ventricular size is moderately enlarged. There is mildly elevated pulmonary artery systolic pressure. The estimated right ventricular systolic pressure is 19.3 mmHg. 3. 4. Right atrial size was mildly dilated. 5. The mitral valve is normal in structure. Trivial mitral valve regurgitation. The aortic valve is tricuspid. Aortic valve regurgitation is not visualized. No aortic stenosis is present. 6. The inferior vena cava is dilated in size with >50% respiratory variability, suggesting right atrial pressure of 8 mmHg.  Review of Systems: [y] = yes, [ ]  = no   General: Weight gain [ ] ; Weight loss [ ] ; Anorexia [ ] ; Fatigue [ Y]; Fever [ ] ; Chills [ ] ; Weakness Troy May ]  Cardiac: Chest pain/pressure [ Y]; Resting SOB [ ] ; Exertional SOB [Y ]; Orthopnea [ ] ; Pedal Edema [ ] ; Palpitations [ ] ; Syncope [ ] ; Presyncope [ ] ; Paroxysmal nocturnal dyspnea[ ]   Pulmonary: Cough [ Y]; Wheezing[ ] ; Hemoptysis[ ] ; Sputum [ ] ; Snoring [ ]   GI: Vomiting[ ] ; Dysphagia[ ] ; Melena[ ] ; Hematochezia [ ] ; Heartburn[ ] ; Abdominal pain [ ] ; Constipation [ ] ;  Diarrhea [ ] ; BRBPR [ ]   GU: Hematuria[ ] ; Dysuria [ ] ; Nocturia[ ]   Vascular: Pain in legs with walking [ ] ; Pain in feet with lying flat [ ] ; Non-healing sores [ ] ; Stroke [ ] ; TIA [ ] ; Slurred speech [ ] ;  Neuro: Headaches[ ] ; Vertigo[ ] ; Seizures[ ] ; Paresthesias[ ] ;Blurred vision [ ] ; Diplopia [ ] ; Vision changes [ ]   Ortho/Skin: Arthritis [ ] ; Joint pain [ ] ; Muscle pain [ ] ; Joint swelling [ ] ; Back Pain [ ] ; Rash [ ]   Psych: Depression[ ] ; Anxiety[ ]   Heme: Bleeding problems [ ] ; Clotting disorders [ ] ; Anemia [ ]   Endocrine: Diabetes [ ] ; Thyroid dysfunction[ ]   Home Medications Prior to Admission medications   Medication Sig Start Date End Date Taking? Authorizing Provider  cetirizine (ZYRTEC) 10 MG tablet Take 10 mg by mouth daily as needed for allergies.   Yes [provider]  ibuprofen (ADVIL) 200 MG tablet Take 600 mg by mouth every 6 (six) hours as needed for fever or headache.   Yes [provider]  OVER THE COUNTER MEDICATION Take 1 tablet by mouth daily. gummy   Yes [provider]    Past Medical History: Past Medical History:  Diagnosis Date   Ascending aortic aneurysm    Bilateral renal masses    CAD (coronary artery disease)    HLD (hyperlipidemia)    Hypertension    LV (left ventricular) mural thrombus    Systolic heart failure ([place])     Past Surgical History: Past Surgical History:  Procedure Laterality Date   RIGHT/LEFT HEART CATH AND CORONARY ANGIOGRAPHY N/A 07/27/2021   Procedure: RIGHT/LEFT HEART CATH AND CORONARY ANGIOGRAPHY;  Surgeon: Jolaine Artist, MD;  Location: [facility];  Service: Cardiovascular;  Laterality: N/A;    Family History: Family History  Problem Relation Age of Onset   Hypertension Brother    Heart failure Neg Hx     Social History: Social History   Socioeconomic History   Marital status: Married    Spouse name: Not on file   Number of children: Not on file   Years of education:  Not on file   Highest education level: Not on file  Occupational History   Not on file  Tobacco Use   Smoking status: Some Days    Types: Cigarettes   Smokeless tobacco: Not on file  Substance and Sexual Activity   Alcohol use: Not on file   Drug use: Not on file   Sexual activity: Not on file  Other Topics Concern   Not on file  Social History Narrative   Not on file   Social Determinants of Health   Financial Resource Strain: Not on file  Food Insecurity: Not on file  Transportation Needs: Not on file  Physical Activity: Not on file  Stress: Not on file  Social Connections: Not on file    Allergies:  No Known Allergies  Objective:    Vital Signs:   Temp:  [97.5 F (36.4 C)-98.3 F (36.8 C)] 97.5 F (36.4 C) (11/28 1258) Pulse Rate:  [  46-64] 53 (11/28 1258) Resp:  [15-18] 18 (11/28 1217) BP: (130-157)/(94-107) 138/103 (11/28 1258) SpO2:  [93 %-100 %] 96 % (11/28 1258) Weight:  [85.1 kg] 85.1 kg (11/28 0558) Last BM Date: 07/26/21  Weight change: Filed Weights   07/25/21 1411 07/26/21 0535 07/27/21 0558  Weight: 85.6 kg 84.7 kg 85.1 kg    Intake/Output:   Intake/Output Summary (Last 24 hours) at 07/27/2021 1313 Last data filed at 07/27/2021 0731 Gross per 24 hour  Intake 1374.85 ml  Output 50 ml  Net 1324.85 ml      Physical Exam    General:  Well appearing. No resp difficulty HEENT: normal Neck: supple. JVP 8 cm. Carotids 2+ bilat; no bruits. No lymphadenopathy or thyromegaly appreciated. Cor: PMI nondisplaced. Regular rate & rhythm. No rubs, gallops or murmurs. Lungs: clear Abdomen: soft, nontender, nondistended. No hepatosplenomegaly. No bruits or masses. Good bowel sounds. Extremities: no cyanosis, clubbing, rash, edema Neuro: alert & orientedx3, cranial nerves grossly intact. moves all 4 extremities w/o difficulty. Affect pleasant   Telemetry   SB/ NSR upper 50s-low 60s   EKG    NSR LAE, 95 bpm  Labs   Basic Metabolic  Panel: Recent Labs  Lab 07/24/21 2021 07/25/21 0248 07/26/21 0238 07/27/21 0257 07/27/21 1041 07/27/21 1046 07/27/21 1050  NA 135 132* 134* 135 138 138 138  K 4.4 3.9 3.7 4.2 3.9 4.2 4.0  CL 101 101 101 104  --   --   --   CO2 27 23 27 23   --   --   --   GLUCOSE 110* 93 140* 91  --   --   --   BUN 19 18 16 17   --   --   --   CREATININE 1.56* 1.28* 1.33* 1.30*  --   --   --   CALCIUM 8.2* 7.8* 7.6* 7.9*  --   --   --   MG  --   --   --  2.0  --   --   --   PHOS  --   --   --  4.0  --   --   --     Liver Function Tests: Recent Labs  Lab 07/27/21 0257  AST 43*  ALT 33  ALKPHOS 56  BILITOT 0.8  PROT 5.2*  ALBUMIN 2.6*   No results for input(s): LIPASE, AMYLASE in the last 168 hours. No results for input(s): AMMONIA in the last 168 hours.  CBC: Recent Labs  Lab 07/24/21 2021 07/26/21 0238 07/27/21 0257 07/27/21 1041 07/27/21 1046 07/27/21 1050  WBC 5.4 3.8* 4.5  --   --   --   NEUTROABS 3.3  --   --   --   --   --   HGB 15.6 14.0 14.3 13.6 14.3 13.9  HCT 47.2 40.8 41.7 40.0 42.0 41.0  MCV 96.5 94.0 93.3  --   --   --   PLT 111* 103* 102*  --   --   --     Cardiac Enzymes: No results for input(s): CKTOTAL, CKMB, CKMBINDEX, TROPONINI in the last 168 hours.  BNP: BNP (last 3 results) Recent Labs    07/24/21 2021  BNP 1,111.1*    ProBNP (last 3 results) No results for input(s): PROBNP in the last 8760 hours.   CBG: No results for input(s): GLUCAP in the last 168 hours.  Coagulation Studies: No results for input(s): LABPROT, INR in the last 72 hours.   Imaging  CARDIAC CATHETERIZATION  Result Date: 07/27/2021   Ost RCA to Prox RCA lesion is 100% stenosed.   Prox Cx to Mid Cx lesion is 30% stenosed.   1st Mrg lesion is 20% stenosed.   Prox LAD lesion is 60% stenosed.   Mid LAD lesion is 90% stenosed.   Mid LAD to Dist LAD lesion is 100% stenosed. Findings: Ao = 143/88 (109) LV = 146/25 RA =  8 RV = 41/12 PA = 45/20 (29) PCW = 21 Fick cardiac  output/index = 3.4/1.7 PVR = 2.4 WU SVR = 2383 FA sat = 95% PA sat = 54%, 57% Assessment: Severe 2v CAD with occluded LAD and RCA Severe iCM EF 25% Elevated filling pressures with low output Plan/Discussion: Will stop carvedilol. Continue diuresis. Titrate GDMT. Place PICC. Will need cMRI to assess viability of LAD territory but suspect minimal viability based on echo. Will discuss management of RCC with IR with possible embolization. Glori Bickers, MD 11:59 AM  [place] EKG SITE RITE  Result Date: 07/27/2021 If Site Rite image not attached, placement could not be confirmed due to current cardiac rhythm.    Medications:     Current Medications:  aspirin EC  81 mg Oral Daily   empagliflozin  10 mg Oral Daily   furosemide  80 mg Intravenous BID   guaiFENesin  1,200 mg Oral BID   losartan  50 mg Oral Daily   oseltamivir  30 mg Oral BID   sodium chloride flush  3 mL Intravenous Q12H   sodium chloride flush  3 mL Intravenous Q12H    Infusions:  sodium chloride     sodium chloride     heparin         Assessment/Plan   Acute Biventricular Heart Failure/ Ischemic CM (new) - Echo EF 25-30%, RV moderately reduced, GIIDD - R/LHC w/ severe 2VCAD, elevated filling pressures (PCWP 21) and low output, CI 1.7  - Diurese w/ IV Lasix 80 mg bid  - Place PICC to follow Co-ox and CVP monitoring. May need inotropic support - Discontinue ? blocker (Coreg) given low output  - Continue Jardiance 10 mg daily  - Continue Losartan 50 mg daily. If renal fx remains stable, transition to [facility]  - Consider adding Digoxin  - Plan cMRI to assess viability of LAD territory but suspect minimal viability based on echo.  2. CAD - Diagnostic LHC w/ severe 2V CAD, mid-distal LAD 100% stenosed, ost-prox RCA 100% stenosed  - Will need cMRI to assess viability of LAD territory but suspect minimal viability based on echo.  - medical therapy for now - heparin gtt - ASA 81 mg daily  - Add Atorva 80 nighlty (LDL  123) - Holding ? blocker due to low output HF   3. LV Thrombus  - 2/2 severe LV dysfunction  - heparin gtt for now - will need transition to oral anticoagulant after all invasive procedures are completed   4. Bilateral Renal Masses, Suspicion for RCC - incidental finding on chest CT - right measuring 5.5 cm and the left measuring 3.8 cm - MR of Abdomen also concerning for RCC - Will discuss management of RCC with IR with possible embolization  5. Hypertension  - markedly hypertensive on admit, requiring labetalol  - BP now better controlled - continue ARB>>transition to ARNi  - off ? blocker w/ low output - May need Bidil   6. AKI  - SCr 1.56 on admit, baseline unknown  - 1.33 today, suspect cardiorenal/low output -  monitor w/ diuresis, follow BMP  - place PICC to follow co-ox, may need inotropic support   7. Influenza A - Tamiflu per primary team   8. HLD w/ LDL Goal < 70 - LDL elevated at 123 mg/dL. LFTs ok  - Add Atorva 80 mg nightly - will need f/u FLP in 6-8 wks   9. Thrombocytopenia  - plts 111>>103>>102 - ? Acute vs chronic. Baseline unknown.  - Hgb ok, 14 - follow CBC   10. Ascending Thoracic Aorta - 43 mm in diameter on chest CT - Tricuspid AoV on echo. No AI/AS - BP control per above + statin  - will need annual scans for surveillance   Length of Stay: 2  Lyda Jester, PA-C  07/27/2021, 1:13 PM  Advanced Heart Failure Team Pager [phone] (M-F; 7a - 5p)  Please contact [facility] for night-coverage after hours (4p -7a ) and weekends on [email or web address]  Patient seen and examined with the above-signed Advanced Practice Provider and/or Housestaff. I personally reviewed laboratory data, imaging studies and relevant notes. I independently examined the patient and formulated the important aspects of the plan. I have edited the note to reflect any of my changes or salient points. I have personally discussed the plan with the patient and/or family.  71  y/o male with h/o tobacco abuse, HTN admitted with Flu A and acute systolic HF EF 27% with large apical thrombus.  Cath today with severe 2v CAD, elevated filling pressures and low output.   W/u also notable for bilateral RCC  General:  Lying in bed No resp difficulty HEENT: normal Neck: supple. JVP 10 Carotids 2+ bilat; no bruits. No lymphadenopathy or thryomegaly appreciated. Cor: PMI nondisplaced. Regular rate & rhythm. +s3 Lungs: clear Abdomen: soft, nontender, nondistended. No hepatosplenomegaly. No bruits or masses. Good bowel sounds. Extremities: no cyanosis, clubbing, rash, edema Neuro: alert & orientedx3, cranial nerves grossly intact. moves all 4 extremities w/o difficulty. Affect pleasant  Place PICC. Continue diuresis. Hold b-blocker. Add digoxin if renal function stable after cath. Low threshold to add milrinone. Will d/w IR about possibility of embolization of RCC. Will get cMRI to assess for viability in LAD territory but I suspect will have little or no viability there. Continue heparin for LV thrombus.  Glori Bickers, MD  2:22 PM

## 2021-07-27 NOTE — Interval H&P Note (Signed)
History and Physical Interval Note:  07/27/2021 10:31 AM  Troy May  has presented today for surgery, with the diagnosis of NSTEMI.  The various methods of treatment have been discussed with the patient and family. After consideration of risks, benefits and other options for treatment, the patient has consented to  Procedure(s): RIGHT/LEFT HEART CATH AND CORONARY ANGIOGRAPHY (N/A) and possible coronary angioplasty as a surgical intervention.  The patient's history has been reviewed, patient examined, no change in status, stable for surgery.  I have reviewed the patient's chart and labs.  Questions were answered to the patient's satisfaction.     Bettye Sitton

## 2021-07-27 NOTE — Progress Notes (Signed)
Peripherally Inserted Central Catheter Placement  The IV Nurse has discussed with the patient and/or persons authorized to consent for the patient, the purpose of this procedure and the potential benefits and risks involved with this procedure.  The benefits include less needle sticks, lab draws from the catheter, and the patient may be discharged home with the catheter. Risks include, but not limited to, infection, bleeding, blood clot (thrombus formation), and puncture of an artery; nerve damage and irregular heartbeat and possibility to perform a PICC exchange if needed/ordered by physician.  Alternatives to this procedure were also discussed.  Bard Power PICC patient education guide, fact sheet on infection prevention and patient information card has been provided to patient /or left at bedside.    PICC Placement Documentation  PICC Double Lumen 07/27/21 PICC Right Brachial 45 cm 1 cm (Active)  Indication for Insertion or Continuance of Line Vasoactive infusions 07/27/21 1508  Exposed Catheter (cm) 1 cm 07/27/21 1508  Site Assessment Clean;Dry;Intact 07/27/21 1508  Lumen #1 Status Flushed;Saline locked;Blood return noted 07/27/21 1508  Lumen #2 Status Flushed;Saline locked;Blood return noted 07/27/21 1508  Dressing Type Transparent 07/27/21 1508  Dressing Status Clean;Dry;Intact 07/27/21 1508  Antimicrobial disc in place? Yes 07/27/21 1508  Dressing Intervention New dressing;Other (Comment) 07/27/21 1508    Wife signed consent at bedside   Synthia Innocent 07/27/2021, 3:09 PM

## 2021-07-27 NOTE — Progress Notes (Signed)
Progress Note  Patient Name: Troy May Date of Encounter: 07/27/2021  [facility] Cardiologist: New to [facility]  Subjective   No chest pain, or cough today.  Notes that he things the flu caused most of his symptoms.   Inpatient Medications    Scheduled Meds:  aspirin EC  81 mg Oral Daily   carvedilol  6.25 mg Oral BID WC   guaiFENesin  1,200 mg Oral BID   losartan  50 mg Oral Daily   oseltamivir  30 mg Oral BID   sodium chloride flush  3 mL Intravenous Q12H   Continuous Infusions:  sodium chloride     sodium chloride 10 mL/hr at 07/27/21 0603   heparin 1,100 Units/hr (07/27/21 0114)   PRN Meds: sodium chloride, acetaminophen **OR** acetaminophen, ipratropium-albuterol, ondansetron **OR** ondansetron (ZOFRAN) IV, senna-docusate, sodium chloride flush, sodium chloride HYPERTONIC   Vital Signs    Vitals:   07/26/21 1601 07/26/21 2146 07/27/21 0046 07/27/21 0558  BP: (!) 136/98 (!) 142/106 (!) 142/107 (!) 157/97  Pulse: 60 64 63 60  Resp: 18 15 16 15   Temp: 98.1 F (36.7 C) 98 F (36.7 C) 98.3 F (36.8 C) 98.2 F (36.8 C)  TempSrc: Oral Oral Oral Oral  SpO2: 99% 100% 97% 99%  Weight:    85.1 kg  Height:        Intake/Output Summary (Last 24 hours) at 07/27/2021 0949 Last data filed at 07/27/2021 0731 Gross per 24 hour  Intake 1377.85 ml  Output 250 ml  Net 1127.85 ml   Last 3 Weights 07/27/2021 07/26/2021 07/25/2021  Weight (lbs) 187 lb 9.6 oz 186 lb 12.8 oz 188 lb 11.4 oz  Weight (kg) 85.095 kg 84.732 kg 85.6 kg      Telemetry    SR- Personally Reviewed  ECG    No new last 24 - Personally Reviewed  Physical Exam   Gen: no distress, well nourished   Neck: No JVD Cardiac: No Rubs or Gallops, no Murmur, regular rate+2 radial pulses Respiratory: Clear to auscultation bilaterally, normal effort, normal  respiratory rate GI: Soft, nontender, non-distended  MS: No  edema;  moves all extremities Integument: Skin feels warm Neuro:  At time of  evaluation, alert and oriented to person/place/time/situation  Psych: Normal affect, patient feels OK, a bit overwhelmed   Labs    High Sensitivity Troponin:   Recent Labs  Lab 07/24/21 2021 07/24/21 2340  TROPONINIHS 159* 161*     Chemistry Recent Labs  Lab 07/25/21 0248 07/26/21 0238 07/27/21 0257  NA 132* 134* 135  K 3.9 3.7 4.2  CL 101 101 104  CO2 23 27 23   GLUCOSE 93 140* 91  BUN 18 16 17   CREATININE 1.28* 1.33* 1.30*  CALCIUM 7.8* 7.6* 7.9*  MG  --   --  2.0  PROT  --   --  5.2*  ALBUMIN  --   --  2.6*  AST  --   --  43*  ALT  --   --  33  ALKPHOS  --   --  56  BILITOT  --   --  0.8  GFRNONAA >60 58* 59*  ANIONGAP 8 6 8     Lipids  Recent Labs  Lab 07/25/21 0248  CHOL 171  TRIG 47  HDL 39*  LDLCALC 123*  CHOLHDL 4.4    Hematology Recent Labs  Lab 07/24/21 2021 07/26/21 0238 07/27/21 0257  WBC 5.4 3.8* 4.5  RBC 4.89 4.34 4.47  HGB 15.6  14.0 14.3  HCT 47.2 40.8 41.7  MCV 96.5 94.0 93.3  MCH 31.9 32.3 32.0  MCHC 33.1 34.3 34.3  RDW 15.3 14.8 14.6  PLT 111* 103* 102*   Thyroid No results for input(s): TSH, FREET4 in the last 168 hours.  BNP Recent Labs  Lab 07/24/21 2021  BNP 1,111.1*    DDimer No results for input(s): DDIMER in the last 168 hours.   Radiology    CT Angio Chest Pulmonary Embolism (PE) W or WO Contrast  Result Date: 07/25/2021 CLINICAL DATA:  Fever, fatigue.  Evaluate for pulmonary embolism. EXAM: CT ANGIOGRAPHY CHEST WITH CONTRAST TECHNIQUE: Multidetector CT imaging of the chest was performed using the standard protocol during bolus administration of intravenous contrast. Multiplanar CT image reconstructions and MIPs were obtained to evaluate the vascular anatomy. CONTRAST:  129mL OMNIPAQUE IOHEXOL 350 MG/ML SOLN COMPARISON:  None. FINDINGS: Vascular Findings: There is adequate opacification of the pulmonary arterial system with the main pulmonary artery measuring 419 Hounsfield units. There are no discrete filling  defects within the pulmonary arterial tree to suggest pulmonary embolism. Borderline enlarged caliber the main pulmonary artery measuring 33 mm in diameter. Marked cardiomegaly, in particular, there is enlargement of the left ventricle. Coronary artery calcifications. No pericardial effusion though a small amount of fluid is seen within the pericardial recess. Mild fusiform aneurysmal dilatation of the ascending thoracic aorta measuring 43 mm in diameter (image 55, series 7). No evidence of an intramural hematoma. Review of the MIP images confirms the above findings. ---------------------------------------------------------------------------------- Nonvascular Findings: Mediastinum/Lymph Nodes: Evaluation for mediastinal and hilar adenopathy is degraded secondary to tailoring the examination for evaluation of the pulmonary arteries. Scattered mediastinal lymph nodes are mildly prominent though not enlarged by size criteria with index prevascular lymph node measuring 0.9 cm in greatest short axis diameter (image 43, series 8). No definitive bulky mediastinal, hilar or axillary lymphadenopathy. Lungs/Pleura: Circumferential bronchial wall thickening, most conspicuously involving the bilateral lower lobes. Scattered ill-defined somewhat tree-in-bud ground-glass opacities are seen with the right upper (images 50 and 55, series 8) and lower lobes (image 77 and 86, series 8). No associated air bronchograms. Minimal bibasilar ground-glass atelectasis. No pleural effusion or pneumothorax. Apical predominant centrilobular and paraseptal emphysematous change. There is a punctate (3 mm) pulmonary nodule within right middle lobe (image 73, series 8). Upper abdomen: Limited early arterial phase evaluation of the upper abdomen demonstrates isoattenuating bilateral renal masses, the right measuring 5.5 x 4.6 cm (image 129, series 7, and the left measuring 4.8 x 4.1 cm (image 159, series 7). Mild nodularity of the hepatic contour  as could be seen in the setting of cirrhotic change. Musculoskeletal: No acute or aggressive osseous abnormalities. Regional soft tissues appear normal. Normal appearance of the thyroid gland. IMPRESSION: 1. No evidence of pulmonary embolism. 2. Diffuse bronchial wall thickening with scattered ill-defined somewhat tree-in-bud ground-glass opacities within the right upper and lower lobes, nonspecific though could be seen in the setting of early atypical infection superimposed on bronchitis/airways disease. 3. Bilateral isoattenuating renal masses, the right measuring 5.5 cm and the left measuring 3.8 cm, incompletely evaluated on the present examination, though worrisome for renal cell carcinomas. Further evaluation with nonemergent renal protocol CT or MRI could be performed as indicated. 4.  Emphysema (ICD10-J43.9). 5. Punctate (3 mm) right middle lobe pulmonary nodule. Comparison with prior outside examinations is advised. Pending the results of the above recommended dedicated renal imaging, a non-contrast chest CT can be considered in 12 months given patient's emphysematous change.  This recommendation follows the consensus statement: Guidelines for Management of Incidental Pulmonary Nodules Detected on CT Images: From the Fleischner Society 2017; Radiology 2017; 284:228-243. 6. 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 Recommend annual imaging followup by CTA or MRA. This recommendation follows 2010 ACCF/AHA/AATS/ACR/ASA/SCA/SCAI/SIR/STS/SVM Guidelines for the Diagnosis and Management of Patients with Thoracic Aortic Disease. Circulation. 2010; 121: E720-N470. Aortic aneurysm NOS (ICD10-I71.9) 7. Marked cardiomegaly with enlargement of the caliber the main pulmonary artery, nonspecific though could be seen in the setting pulmonary arterial hypertension. Further evaluation with cardiac echo could be performed as indicated. 8. Coronary artery calcifications. Aortic  Atherosclerosis (ICD10-I70.0). 9. Mild nodularity hepatic contour as could be seen in the setting of cirrhotic change. Correlation with LFTs is advised. Electronically Signed   By: Sandi Mariscal M.D.   On: 07/25/2021 12:18   MR ABDOMEN W WO CONTRAST  Result Date: 07/26/2021 CLINICAL DATA:  70 year old male with history of renal mass. EXAM: MRI ABDOMEN WITHOUT AND WITH CONTRAST TECHNIQUE: Multiplanar multisequence MR imaging of the abdomen was performed both before and after the administration of intravenous contrast. CONTRAST:  69mL GADAVIST GADOBUTROL 1 MMOL/ML IV SOLN, 8.10mL GADAVIST GADOBUTROL 1 MMOL/ML IV SOLN COMPARISON:  No prior abdominal MRI.  Chest CTA 07/25/2021. FINDINGS: Comment: Portions of today's examination are severely limited by large amount of patient respiratory motion. Lower chest: Cardiomegaly with left ventricular dilatation, rounding of the apex of the left ventricle, and focal area of increased T1 signal intensity in the apex of the left ventricle, corresponding to a filling defect on post gadolinium images, indicative of intracardiac thrombus. Hepatobiliary: Liver has a slightly shrunken appearance and nodular contour, indicative of underlying cirrhosis. No discrete cystic or solid hepatic lesions. No intra or extrahepatic biliary ductal dilatation. Gallbladder is normal in appearance. Pancreas: No pancreatic mass. No pancreatic ductal dilatation. No pancreatic or peripancreatic fluid collections or inflammatory changes. Spleen:  Unremarkable. Adrenals/Urinary Tract: The lesions of concern in the kidneys bilaterally are heterogeneous in signal intensity. The largest of these lesions is in the lateral aspect of the interpolar region of the right kidney (axial image 78 of series 1600) measuring 5.6 x 5.0 cm, predominantly T1 hyperintense and T2 hypointense with no definitive internal enhancement on post gadolinium imaging (although subtraction imaging is severely limited by variable degrees  of inspiration). The smaller lesion in the posterior aspect of the interpolar region of the left kidney measures 3.9 x 3.8 cm (axial image 85 of series 1600) and is more heterogeneous in T1 signal intensity with some areas of T1 hyperintensity, predominantly T2 hypointense, with equivocal low-level enhancement on post gadolinium imaging. Importantly, however, there is a smaller lesion in the anterior aspect of the lower pole of the right kidney (axial image 92 of series 1600) which measures 2.2 x 1.9 cm and is T1 hypointense, poorly visualized on T2 weighted images but predominantly mildly T2 hypointense, and demonstrates some low-level post gadolinium enhancement, concerning for neoplasm. These lesions are all encapsulated within Gerota's fascia and are separated from the renal veins which are widely patent bilaterally. No hydroureteronephrosis. Bilateral adrenal glands are normal in appearance. Stomach/Bowel: Visualized portions are unremarkable. Vascular/Lymphatic: Aortic atherosclerosis with fusiform aneurysmal dilatation of the infrarenal abdominal aorta which measures up to 3 cm in diameter in the visualized portions of the abdomen. No definite lymphadenopathy confidently identified in the abdomen. Other: No significant volume of ascites noted in the visualized portions of the peritoneal cavity. Musculoskeletal: No aggressive appearing osseous lesions  are noted in the visualized portions of the skeleton. IMPRESSION: 1. 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 In addition, there is a larger lesion in the lateral aspect of the interpolar region of the right kidney which is favored to represent a large hemorrhagic cyst. Urologic consultation is recommended. 2. Intracardiac thrombus in the apex of the left ventricle, which places the patient at risk for systemic embolization. Consultation with  Cardiology for clinical management is recommended. 3. Cirrhosis. 4. Aortic atherosclerosis with fusiform aneurysmal dilatation of the infrarenal abdominal aorta which measures up to 3.0 cm in diameter. These results will be called to the ordering clinician or representative by the Radiologist Assistant, and communication documented in the PACS or [employer]. Electronically Signed   By: Vinnie Langton M.D.   On: 07/26/2021 11:07    Cardiac Studies   Echo 07/25/21:  1. Left ventricular ejection fraction, by estimation, is 25 to 30%. The  left ventricle has severely decreased function. The left ventricle  demonstrates regional wall motion abnormalities (see scoring  diagram/findings for description). The left  ventricular internal cavity size was mildly dilated. There is mild left  ventricular hypertrophy. Left ventricular diastolic parameters are  consistent with Grade II diastolic dysfunction (pseudonormalization).  Elevated left atrial pressure.   2. Large LV apical thrombus measuring 2.5cm x 1.8cm   3. Right ventricular systolic function is moderately reduced. The right  ventricular size is moderately enlarged. There is mildly elevated  pulmonary artery systolic pressure. The estimated right ventricular  systolic pressure is 96.7 mmHg.   4. Right atrial size was mildly dilated.   5. The mitral valve is normal in structure. Trivial mitral valve  regurgitation.   6. The aortic valve is tricuspid. Aortic valve regurgitation is not  visualized. No aortic stenosis is present.   7. The inferior vena cava is dilated in size with >50% respiratory  variability, suggesting right atrial pressure of 8 mmHg.   Patient Profile     70 y.o. male with no known medical history who presents with fevers, chills, shortness of breath.  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  Assessment & Plan    Acute on Chronic HF LV Thrombus Influenza A s/p Tamiflu RCC by MRI AKI through admission NSTEMI - will need LHC and RHC and planned today:  ultimately will need urology eval as well.  We will need to evaluate timing of interventions with urology if he does have multi-vessel disease -on carvedilol 6.25 mg twice daily -Continue losartan 50 mg daily.  Plan to transition to [facility] if stable renal function after cath - presently with AKI, this improved with IVD, presently euvolemic - will attempt jardiance today, MRA may not be added until outpatient - on heparin drip and ASA - have reviewed the MRI report with patient and family in brief  For questions or updates, please contact [facility] Please consult [email or web address] for contact info under        Signed, Werner Lean, MD  07/27/2021, 9:49 AM

## 2021-07-27 NOTE — Progress Notes (Addendum)
PROGRESS NOTE  Troy May FOY:[id number] DOB: 1950-12-31 DOA: 07/24/2021 PCP: Pcp, No  HPI/Recap of past 24 hours: Troy May is a 70 y.o. male with medical history significant for tobacco use disorder who presents for evaluation of fever, chills, shortness of breath and cough for the last 2 days.  Associated with pleuritic chest pain worse with coughing.  No prior history of DVT or PE, no prolonged trips or immobilization.  Quit smoking about a month ago.  No alcohol or illicit drug use.    Work-up in the ED revealed influenza A infection for which he was started on Tamiflu on admission.  Also revealed elevated troponin, a 2D echo was ordered and cardiology was consulted.  2D echo revealed a large LV apical thrombus measuring 2.5 x 1.8 cm.  LVEF 25 to 30%, regional wall motion abnormalities, grade 2 diastolic dysfunction.  He was started on heparin drip.  He had a CT angio chest which was negative for pulmonary embolism however revealed incidentally found bilateral renal masses.  MRI abdomen with and without contrast ordered on 07/25/2021, results are pending.  07/27/2021: Patient was seen and examined at his bedside.  His wife was present in the room.  He has no new complaints.    Assessment/Plan: Principal Problem:   NSTEMI (non-ST elevated myocardial infarction) ([place]) Active Problems:   Influenza A   Essential hypertension   AKI (acute kidney injury) ([place])   Elevated troponin   Acute combined systolic and diastolic heart failure (HCC)   Left ventricular apical thrombus  Influenza A infection Presented with fevers, nonproductive cough, chest discomfort. Respiratory viral panel positive for influenza A on 07/25/2021. Started on Tamiflu 30 mg on admission twice daily x5 days. Continue supportive care with antitussives as needed Bronchodilators as needed Mobilize as tolerated. Not hypoxic with O2 saturation 95% on room air. Maintain O2 saturation greater than 92%. Added hypersaline  nebs twice daily x3 days and Mucinex 1200 mg twice daily x3 days. DuoNebs every 6 hours.  Apical thrombus, seen on 2D echo, unclear etiology Full dose subcu Lovenox started on admission discontinued Heparin drip started on 07/25/2021. Closely monitor on cardiac telemetry.  Bilateral renal masses, incidentally found. Suspicion for renal cell carcinoma Personally reviewed CT scan done on 07/25/2021 showing bilateral renal masses. MRI abdomen ordered and results are pending. Continue to follow renal function. Discussed with Dr. Jeffie Pollock, urology, will follow up outpatient.  Leukopenia/thrombocytopenia Suspect in the setting of acute illness/viral illness Continue to monitor CBC WBC downtrending 3.8 from 5.4, platelet 103 from 111 Continue to monitor  Elevated troponin, rule out ACS Troponin on admission 159, up trended to 161 Continue heparin drip Plan for diagnostic heart cath on 07/27/2021.  Acute diastolic and systolic CHF Presented with elevated BNP greater than 1000 Cardiomegaly seen on chest x-ray Trace lower extremity edema bilaterally. 2D echo done on 07/25/2021 showed a large LV apical thrombus measuring 2.5 x 1.8 cm.  LVEF 25 to 30%, regional wall motion abnormalities, grade 2 diastolic dysfunction. Start strict I's and O's and daily weight.  AKI Presented with creatinine of 1.56 with GFR 47 Creatinine uptrending 1.33 from 1.28.  On presentation creatinine 1.56. Baseline creatinine appears to be 1.2 with GFR greater than 60. Continue to avoid nephrotoxic agents, dehydration and hypotension Continue to closely monitor urine output with strict I's and O's  Hypovolemic hyponatremia Serum sodium is uptrending from 1 32-1 34 Continue to encourage increasing solute oral intake Obtain CMP in the morning to assess liver function  as well.  Cirrhosis, incidentally found on MRI abdomen with contrast Endorses light alcohol use maybe once a week. AST elevated The rest of LFTs  are normal  Critical care time: 45 minutes.     Code Status: Full code  Family Communication: Wife at bedside  Disposition Plan: Likely will discharge to home once cardiology signs off.   Consultants: Cardiology  Procedures: 2D echo  Antimicrobials: Tamiflu  DVT prophylaxis: Heparin drip  Status is: Inpatient status.  Patient will require at least 2 midnights for further evaluation and treatment of present condition.       Objective: Vitals:   07/27/21 1258 07/27/21 1302 07/27/21 1332 07/27/21 1553  BP: (!) 138/103 (!) 144/97 (!) 151/108 (!) 138/103  Pulse: (!) 53 (!) 55 (!) 57   Resp:    18  Temp: (!) 97.5 F (36.4 C) 97.6 F (36.4 C)  97.9 F (36.6 C)  TempSrc:  Oral  Oral  SpO2: 96% 100% 96% 100%  Weight:      Height:        Intake/Output Summary (Last 24 hours) at 07/27/2021 1632 Last data filed at 07/27/2021 0731 Gross per 24 hour  Intake 1137.85 ml  Output 50 ml  Net 1087.85 ml   Filed Weights   07/25/21 1411 07/26/21 0535 07/27/21 0558  Weight: 85.6 kg 84.7 kg 85.1 kg    Exam:  General: 70 y.o. year-old male well developed well nourished in no acute distress.  Alert and oriented x 3. Cardiovascular: Regular rate and rhythm no rubs or gallops. Respiratory: Clear to auscultation with no wheezes or rales  Abdomen: Soft nontender normal bowel sounds present.  Nondistended. Musculoskeletal: Trace lower extremity edema bilaterally.   Skin: No ulcerative lesions noted. Psychiatry: Mood is appropriate for condition and setting. Neuro: Moves all 4 extremities equally, nonfocal.   Data Reviewed: CBC: Recent Labs  Lab 07/24/21 2021 07/26/21 0238 07/27/21 0257 07/27/21 1041 07/27/21 1046 07/27/21 1050  WBC 5.4 3.8* 4.5  --   --   --   NEUTROABS 3.3  --   --   --   --   --   HGB 15.6 14.0 14.3 13.6 14.3 13.9  HCT 47.2 40.8 41.7 40.0 42.0 41.0  MCV 96.5 94.0 93.3  --   --   --   PLT 111* 103* 102*  --   --   --    Basic Metabolic  Panel: Recent Labs  Lab 07/24/21 2021 07/25/21 0248 07/26/21 0238 07/27/21 0257 07/27/21 1041 07/27/21 1046 07/27/21 1050  NA 135 132* 134* 135 138 138 138  K 4.4 3.9 3.7 4.2 3.9 4.2 4.0  CL 101 101 101 104  --   --   --   CO2 27 23 27 23   --   --   --   GLUCOSE 110* 93 140* 91  --   --   --   BUN 19 18 16 17   --   --   --   CREATININE 1.56* 1.28* 1.33* 1.30*  --   --   --   CALCIUM 8.2* 7.8* 7.6* 7.9*  --   --   --   MG  --   --   --  2.0  --   --   --   PHOS  --   --   --  4.0  --   --   --    GFR: Estimated Creatinine Clearance: 54.6 mL/min (A) (by C-G formula based on SCr of  1.3 mg/dL (H)). Liver Function Tests: Recent Labs  Lab 07/27/21 0257  AST 43*  ALT 33  ALKPHOS 56  BILITOT 0.8  PROT 5.2*  ALBUMIN 2.6*   No results for input(s): LIPASE, AMYLASE in the last 168 hours. No results for input(s): AMMONIA in the last 168 hours. Coagulation Profile: No results for input(s): INR, PROTIME in the last 168 hours. Cardiac Enzymes: No results for input(s): CKTOTAL, CKMB, CKMBINDEX, TROPONINI in the last 168 hours. BNP (last 3 results) No results for input(s): PROBNP in the last 8760 hours. HbA1C: No results for input(s): HGBA1C in the last 72 hours. CBG: No results for input(s): GLUCAP in the last 168 hours. Lipid Profile: Recent Labs    07/25/21 0248  CHOL 171  HDL 39*  LDLCALC 123*  TRIG 47  CHOLHDL 4.4   Thyroid Function Tests: No results for input(s): TSH, T4TOTAL, FREET4, T3FREE, THYROIDAB in the last 72 hours. Anemia Panel: No results for input(s): VITAMINB12, FOLATE, FERRITIN, TIBC, IRON, RETICCTPCT in the last 72 hours. Urine analysis:    Component Value Date/Time   COLORURINE YELLOW 07/25/2021 0256   APPEARANCEUR CLEAR 07/25/2021 0256   LABSPEC 1.018 07/25/2021 0256   PHURINE 5.0 07/25/2021 0256   GLUCOSEU NEGATIVE 07/25/2021 0256   HGBUR NEGATIVE 07/25/2021 0256   BILIRUBINUR NEGATIVE 07/25/2021 0256   KETONESUR NEGATIVE 07/25/2021 0256    PROTEINUR NEGATIVE 07/25/2021 0256   NITRITE NEGATIVE 07/25/2021 0256   LEUKOCYTESUR NEGATIVE 07/25/2021 0256   Sepsis Labs: @LABRCNTIP (procalcitonin:4,lacticidven:4)  ) Recent Results (from the past 240 hour(s))  Resp Panel by RT-PCR (Flu A&B, Covid) Nasopharyngeal Swab     Status: Abnormal   Collection Time: 07/24/21  7:56 PM   Specimen: Nasopharyngeal Swab; Nasopharyngeal(NP) swabs in vial transport medium  Result Value Ref Range Status   SARS Coronavirus 2 by RT PCR NEGATIVE NEGATIVE Final    Comment: (NOTE) SARS-CoV-2 target nucleic acids are NOT DETECTED.  The SARS-CoV-2 RNA is generally detectable in upper respiratory specimens during the acute phase of infection. The lowest concentration of SARS-CoV-2 viral copies this assay can detect is 138 copies/mL. A negative result does not preclude SARS-Cov-2 infection and should not be used as the sole basis for treatment or other patient management decisions. A negative result may occur with  improper specimen collection/handling, submission of specimen other than nasopharyngeal swab, presence of viral mutation(s) within the areas targeted by this assay, and inadequate number of viral copies(<138 copies/mL). A negative result must be combined with clinical observations, patient history, and epidemiological information. The expected result is Negative.  Fact Sheet for Patients:  EntrepreneurPulse.com.au  Fact Sheet for Healthcare Providers:  IncredibleEmployment.be  This test is no t yet approved or cleared by the [place] FDA and  has been authorized for detection and/or diagnosis of SARS-CoV-2 by FDA under an Emergency Use Authorization (EUA). This EUA will remain  in effect (meaning this test can be used) for the duration of the COVID-19 declaration under Section 564(b)(1) of the Act, 21 U.S.C.section 360bbb-3(b)(1), unless the authorization is terminated  or revoked sooner.        Influenza A by PCR POSITIVE (A) NEGATIVE Final   Influenza B by PCR NEGATIVE NEGATIVE Final    Comment: (NOTE) The Xpert Xpress SARS-CoV-2/FLU/RSV plus assay is intended as an aid in the diagnosis of influenza from Nasopharyngeal swab specimens and should not be used as a sole basis for treatment. Nasal washings and aspirates are unacceptable for Xpert Xpress SARS-CoV-2/FLU/RSV testing.  Fact Sheet  for Patients: EntrepreneurPulse.com.au  Fact Sheet for Healthcare Providers: IncredibleEmployment.be  This test is not yet approved or cleared by the [place] FDA and has been authorized for detection and/or diagnosis of SARS-CoV-2 by FDA under an Emergency Use Authorization (EUA). This EUA will remain in effect (meaning this test can be used) for the duration of the COVID-19 declaration under Section 564(b)(1) of the Act, 21 U.S.C. section 360bbb-3(b)(1), unless the authorization is terminated or revoked.  Performed at [facility], [place] [address], [place], [place] [address]       Studies: CARDIAC CATHETERIZATION  Result Date: 07/27/2021   Ost RCA to Prox RCA lesion is 100% stenosed.   Prox Cx to Mid Cx lesion is 30% stenosed.   1st Mrg lesion is 20% stenosed.   Prox LAD lesion is 60% stenosed.   Mid LAD lesion is 90% stenosed.   Mid LAD to Dist LAD lesion is 100% stenosed. 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 Continue diuresis. Titrate GDMT. Place PICC. Will need cMRI to assess viability of LAD territory but suspect minimal viability based on echo. Will discuss management of RCC with IR with possible embolization. Glori Bickers, MD 11:59 AM  [place] EKG  SITE RITE  Result Date: 07/27/2021 If Site Rite image not attached, placement could not be confirmed due to current cardiac rhythm.   Scheduled Meds:  aspirin EC  81 mg Oral Daily   atorvastatin  80 mg Oral Daily   Chlorhexidine Gluconate Cloth  6 each Topical Daily   empagliflozin  10 mg Oral Daily   furosemide  80 mg Intravenous BID   guaiFENesin  1,200 mg Oral BID   losartan  50 mg Oral Daily   oseltamivir  30 mg Oral BID   sodium chloride flush  10-40 mL Intracatheter Q12H   sodium chloride flush  3 mL Intravenous Q12H   sodium chloride flush  3 mL Intravenous Q12H    Continuous Infusions:  sodium chloride     heparin       LOS: 2 days     Kayleen Memos, MD [facility] Pager [phone]  If 7PM-7AM, please contact night-coverage [email or web address] Password [facility] 07/27/2021, 4:32 PM

## 2021-07-27 NOTE — Plan of Care (Signed)
  Problem: Activity: Goal: Risk for activity intolerance will decrease Outcome: Progressing   Problem: Nutrition: Goal: Adequate nutrition will be maintained Outcome: Progressing   Problem: Coping: Goal: Level of anxiety will decrease Outcome: Progressing   

## 2021-07-28 ENCOUNTER — Other Ambulatory Visit (HOSPITAL_COMMUNITY): Payer: Self-pay

## 2021-07-28 ENCOUNTER — Inpatient Hospital Stay (HOSPITAL_COMMUNITY): Payer: Medicare Other

## 2021-07-28 DIAGNOSIS — I5021 Acute systolic (congestive) heart failure: Secondary | ICD-10-CM | POA: Diagnosis not present

## 2021-07-28 DIAGNOSIS — I5089 Other heart failure: Secondary | ICD-10-CM

## 2021-07-28 DIAGNOSIS — I5082 Biventricular heart failure: Secondary | ICD-10-CM | POA: Diagnosis not present

## 2021-07-28 DIAGNOSIS — I214 Non-ST elevation (NSTEMI) myocardial infarction: Secondary | ICD-10-CM | POA: Diagnosis not present

## 2021-07-28 LAB — COOXEMETRY PANEL
Carboxyhemoglobin: 1.3 % (ref 0.5–1.5)
Methemoglobin: 0.8 % (ref 0.0–1.5)
O2 Saturation: 66 %
Total hemoglobin: 14.4 g/dL (ref 12.0–16.0)

## 2021-07-28 LAB — BASIC METABOLIC PANEL
Anion gap: 7 (ref 5–15)
BUN: 20 mg/dL (ref 8–23)
CO2: 28 mmol/L (ref 22–32)
Calcium: 8.1 mg/dL — ABNORMAL LOW (ref 8.9–10.3)
Chloride: 103 mmol/L (ref 98–111)
Creatinine, Ser: 1.39 mg/dL — ABNORMAL HIGH (ref 0.61–1.24)
GFR, Estimated: 55 mL/min — ABNORMAL LOW (ref >60)
Glucose, Bld: 98 mg/dL (ref 70–99)
Potassium: 3.8 mmol/L (ref 3.5–5.1)
Sodium: 138 mmol/L (ref 135–145)

## 2021-07-28 LAB — CBC
HCT: 42.4 % (ref 39.0–52.0)
Hemoglobin: 14.1 g/dL (ref 13.0–17.0)
MCH: 31.5 pg (ref 26.0–34.0)
MCHC: 33.3 g/dL (ref 30.0–36.0)
MCV: 94.6 fL (ref 80.0–100.0)
Platelets: 101 10*3/uL — ABNORMAL LOW (ref 150–400)
RBC: 4.48 MIL/uL (ref 4.22–5.81)
RDW: 14.4 % (ref 11.5–15.5)
WBC: 3.7 10*3/uL — ABNORMAL LOW (ref 4.0–10.5)
nRBC: 0 % (ref 0.0–0.2)

## 2021-07-28 LAB — HEPARIN LEVEL (UNFRACTIONATED)
Heparin Unfractionated: 0.16 IU/mL — ABNORMAL LOW (ref 0.30–0.70)
Heparin Unfractionated: 0.48 IU/mL (ref 0.30–0.70)

## 2021-07-28 LAB — MAGNESIUM: Magnesium: 2.1 mg/dL (ref 1.7–2.4)

## 2021-07-28 IMAGING — MR MR CARD MORPHOLOGY WO/W CM
46 of 48 series · 46 of 48 positions shown · IV contrast (Contrast agent)
Comparison: none

CLINICAL DATA: Clinical question of cardiomyopathy
Study assumes HCT of 42 and BSA of 2.01 m2.

EXAM:
CARDIAC MRI
TECHNIQUE: The patient was scanned on a 1.5 Tesla GE magnet. A dedicated
cardiac coil was used. Functional imaging was done using Fiesta
sequences. [DATE], and 4 chamber views were done to assess for RWMA's.
Modified KAREEN rule using a short axis stack was used to
calculate an ejection fraction on a dedicated work station using
Circle software. The patient received 8 cc of Gadavist. After 10
minutes inversion recovery sequences were used to assess for
infiltration and scar tissue.
CONTRAST:  8 cc  of Gadavist

[Series 19: bSSFP · oblique · 8.0mm · 1.61mm/px · 1 of 25 slices shown (1 of 19)]
[im 1/25]
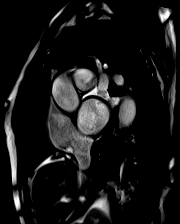

[Series 20: bSSFP · oblique · 8.0mm · 1.61mm/px · 1 of 25 slices shown (2 of 19)]
[im 1/25]
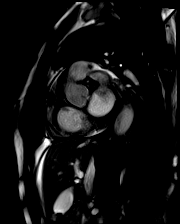

[Series 21: bSSFP · oblique · 8.0mm · 1.61mm/px · 1 of 25 slices shown (3 of 19)]
[im 1/25]
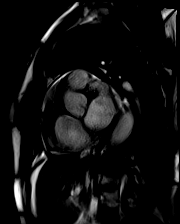

[Series 22: bSSFP · oblique · 8.0mm · 1.61mm/px · 1 of 25 slices shown (4 of 19)]
[im 1/25]
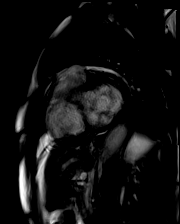

[Series 23: bSSFP · oblique · 8.0mm · 1.61mm/px · 1 of 25 slices shown (5 of 19)]
[im 1/25]
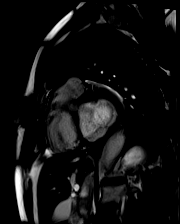

[Series 24: bSSFP · oblique · 8.0mm · 1.61mm/px · 1 of 25 slices shown (6 of 19)]
[im 1/25]
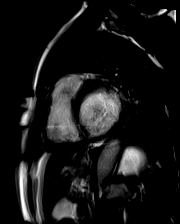

[Series 25: bSSFP · oblique · 8.0mm · 1.61mm/px · 1 of 25 slices shown (7 of 19)]
[im 1/25]
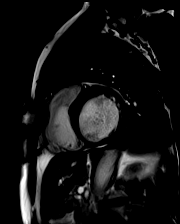

[Series 26: bSSFP · oblique · 8.0mm · 1.61mm/px · 1 of 25 slices shown (8 of 19)]
[im 1/25]
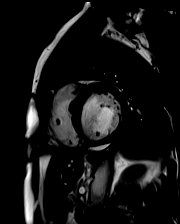

[Series 27: bSSFP · oblique · 8.0mm · 1.61mm/px · 1 of 25 slices shown (9 of 19)]
[im 1/25]
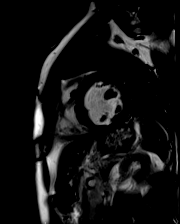

[Series 28: bSSFP · oblique · 8.0mm · 1.61mm/px · 1 of 25 slices shown (10 of 19)]
[im 1/25]
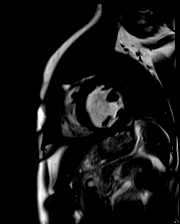

[Series 29: bSSFP · oblique · 8.0mm · 1.61mm/px · 1 of 25 slices shown (11 of 19)]
[im 1/25]
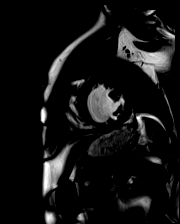

[Series 30: bSSFP · oblique · 8.0mm · 1.61mm/px · 1 of 25 slices shown (12 of 19)]
[im 1/25]
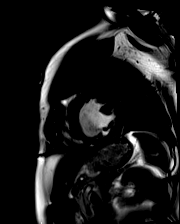

[Series 31: bSSFP · oblique · 8.0mm · 1.61mm/px · 1 of 25 slices shown (13 of 19)]
[im 1/25]
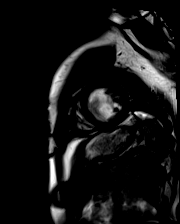

[Series 32: bSSFP · oblique · 8.0mm · 1.61mm/px · 1 of 25 slices shown (14 of 19)]
[im 1/25]
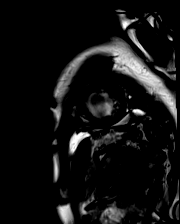

[Series 33: bSSFP · oblique · 8.0mm · 1.61mm/px · 1 of 25 slices shown (15 of 19)]
[im 1/25]
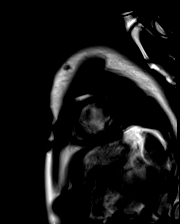

[Series 34: bSSFP · oblique · 8.0mm · 1.61mm/px · 1 of 25 slices shown (16 of 19)]
[im 1/25]
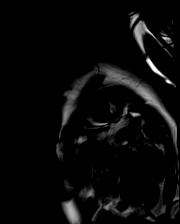

[Series 35: bSSFP · oblique · 8.0mm · 1.61mm/px · 1 of 25 slices shown (17 of 19)]
[im 1/25]
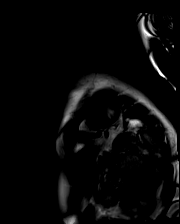

[Series 36: bSSFP · oblique · 8.0mm · 1.61mm/px · 1 of 25 slices shown (18 of 19)]
[im 1/25]
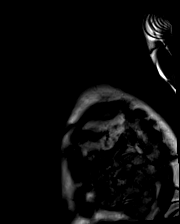

[Series 37: bSSFP · oblique · 8.0mm · 1.61mm/px · 1 of 25 slices shown (19 of 19)]
[im 1/25]
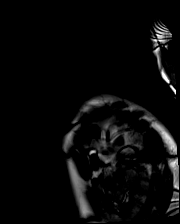

[Series 38: cine_trufi_cs_rt_short axis · oblique · 8.0mm · 1.73mm/px · 1 of 17 slices shown (1 of 27)]
[im 1/17]
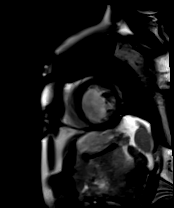

[Series 38: cine_trufi_cs_rt_short axis · oblique · 8.0mm · 1.73mm/px · 1 of 17 slices shown (2 of 27)]
[im 1/17]
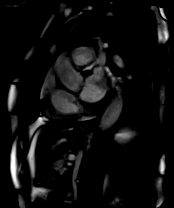

[Series 38: cine_trufi_cs_rt_short axis · oblique · 8.0mm · 1.73mm/px · 1 of 17 slices shown (3 of 27)]
[im 1/17]
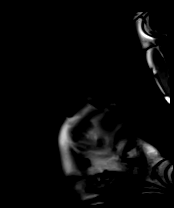

[Series 38: cine_trufi_cs_rt_short axis · oblique · 8.0mm · 1.73mm/px · 1 of 17 slices shown (4 of 27)]
[im 1/17]
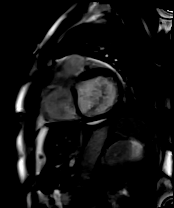

[Series 38: cine_trufi_cs_rt_short axis · oblique · 8.0mm · 1.73mm/px · 1 of 17 slices shown (5 of 27)]
[im 1/17]
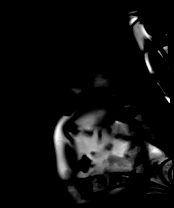

[Series 38: cine_trufi_cs_rt_short axis · oblique · 8.0mm · 1.73mm/px · 1 of 17 slices shown (6 of 27)]
[im 1/17]
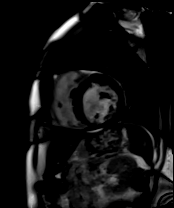

[Series 38: cine_trufi_cs_rt_short axis · oblique · 8.0mm · 1.73mm/px · 1 of 17 slices shown (7 of 27)]
[im 1/17]
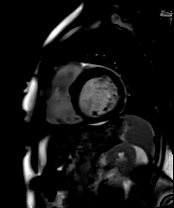

[Series 38: cine_trufi_cs_rt_short axis · oblique · 8.0mm · 1.73mm/px · 1 of 17 slices shown (8 of 27)]
[im 1/17]
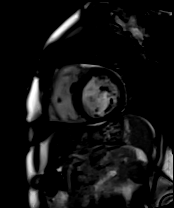

[Series 38: cine_trufi_cs_rt_short axis · oblique · 8.0mm · 1.73mm/px · 1 of 17 slices shown (9 of 27)]
[im 1/17]
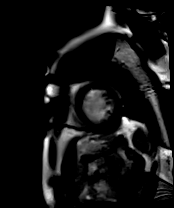

[Series 38: cine_trufi_cs_rt_short axis · oblique · 8.0mm · 1.73mm/px · 1 of 17 slices shown (10 of 27)]
[im 1/17]
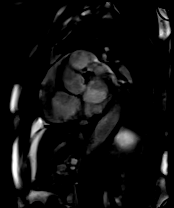

[Series 38: cine_trufi_cs_rt_short axis · oblique · 8.0mm · 1.73mm/px · 1 of 17 slices shown (11 of 27)]
[im 1/17]
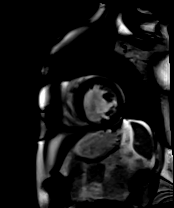

[Series 38: cine_trufi_cs_rt_short axis · oblique · 8.0mm · 1.73mm/px · 1 of 17 slices shown (12 of 27)]
[im 1/17]
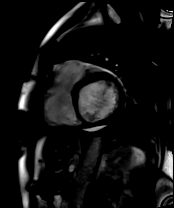

[Series 38: cine_trufi_cs_rt_short axis · oblique · 8.0mm · 1.73mm/px · 1 of 17 slices shown (13 of 27)]
[im 1/17]
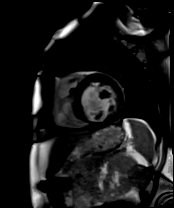

[Series 38: cine_trufi_cs_rt_short axis · oblique · 8.0mm · 1.73mm/px · 1 of 17 slices shown (14 of 27)]
[im 1/17]
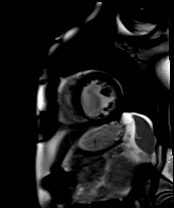

[Series 38: cine_trufi_cs_rt_short axis · oblique · 8.0mm · 1.73mm/px · 1 of 17 slices shown (15 of 27)]
[im 1/17]
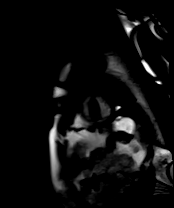

[Series 38: cine_trufi_cs_rt_short axis · oblique · 8.0mm · 1.73mm/px · 1 of 17 slices shown (16 of 27)]
[im 1/17]
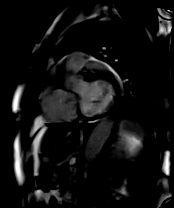

[Series 38: cine_trufi_cs_rt_short axis · oblique · 8.0mm · 1.73mm/px · 1 of 17 slices shown (17 of 27)]
[im 1/17]
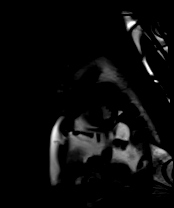

[Series 38: cine_trufi_cs_rt_short axis · oblique · 8.0mm · 1.73mm/px · 1 of 17 slices shown (18 of 27)]
[im 1/17]
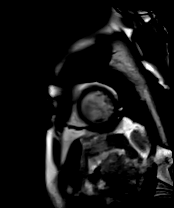

[Series 39: cine_trufi_cs_rt_short axis · axial · 8.0mm · 1.73mm/px · 1 of 17 slices shown (19 of 27)]
[im 1/17]
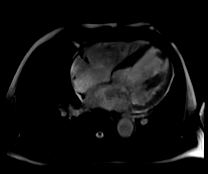

[Series 39: cine_trufi_cs_rt_short axis · axial · 8.0mm · 1.73mm/px · 1 of 17 slices shown (20 of 27)]
[im 1/17]
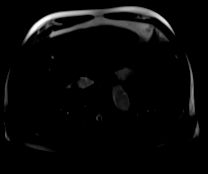

[Series 39: cine_trufi_cs_rt_short axis · axial · 8.0mm · 1.73mm/px · 1 of 17 slices shown (21 of 27)]
[im 1/17]
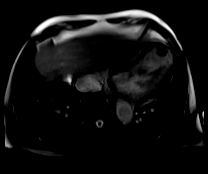

[Series 39: cine_trufi_cs_rt_short axis · axial · 8.0mm · 1.73mm/px · 1 of 17 slices shown (22 of 27)]
[im 1/17]
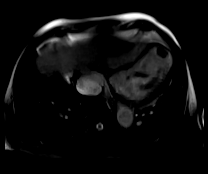

[Series 39: cine_trufi_cs_rt_short axis · axial · 8.0mm · 1.73mm/px · 1 of 17 slices shown (23 of 27)]
[im 1/17]
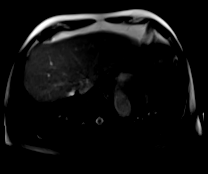

[Series 39: cine_trufi_cs_rt_short axis · axial · 8.0mm · 1.73mm/px · 1 of 17 slices shown (24 of 27)]
[im 1/17]
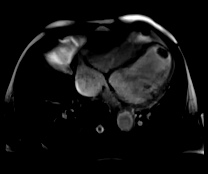

[Series 39: cine_trufi_cs_rt_short axis · axial · 8.0mm · 1.73mm/px · 1 of 17 slices shown (25 of 27)]
[im 1/17]
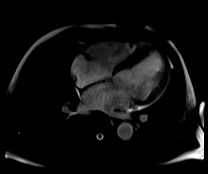

[Series 39: cine_trufi_cs_rt_short axis · axial · 8.0mm · 1.73mm/px · 1 of 17 slices shown (26 of 27)]
[im 1/17]
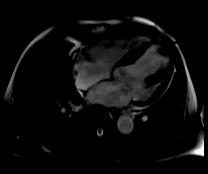

[Series 39: cine_trufi_cs_rt_short axis · axial · 8.0mm · 1.73mm/px · 1 of 17 slices shown (27 of 27)]
[im 1/17]
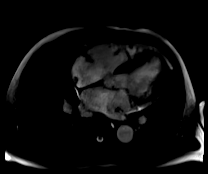

[46 of 48 positions shown; findings below may reference images not displayed]

FINDINGS: 1. Severely dilated left ventricular size, with LVEDD 67 mm, but
LVEDVi 163 mL/m2.

Moderate concentric hypertrophy, with intraventricular septal
thickness of 12 mm, posterior wall thickness of 7 mm, and myocardial
mass index of 108 g/m2.

Severely reduced left ventricular systolic function (LVEF =21 %).
There are regional wall motion abnormalities: Anteroseptal,
inferoseptal, inferior, and apical wall motion abnormalities;
otherwise global hypokinesis.

Left ventricular parametric mapping notable for Normal T2 and
significant elevation apical ECV signal (50-70%).

There is late gadolinium enhancement in the left ventricular
myocardium. 75-100% transmural LGE in the mid and basal inferior and
inferoseptal, transmural apical LGE.

There is a large LV thrombus.

2.  Mildly increased right ventricular size with RVEDVI 117 mL/m2.

Normal right ventricular thickness.

Moderately reduced right ventricular systolic function (RVEF =32%).
There are no regional wall motion abnormalities or aneurysms.

3.  Normal left and right atrial size.

4.  Normal size of the aortic root.

Mild ascending aortic aneurysm 41 mm (black blood imaging not
obtained).

5. Valve assessment:

Aortic Valve: Tri-leaflet aortic valve. Regurgitant fraction 7% mean
gradient 6 mmHg. Mild aortic regurgitation.

Pulmonic Valve: Qualitatively mild regurgitation.

Tricuspid Valve: Qualitatively mild regurgitation.

Mitral Valve: Regurgitant fraction 9% (Aortic-Mitral evaluation).
Mild mitral regurgitation.

6.  Normal pericardium.  No pericardial effusion.

7. Grossly, no extracardiac findings. Recommended dedicated study if
concerned for non-cardiac pathology.

8. Breath hold artifacts noted. This decreased the sensitivity of LV
and RV function evaluation, and valve quantification.
IMPRESSION: Severe LV dysfunction. LAD and RCA territories with scar due not
appear viable.

Large LV thrombus.

## 2021-07-28 MED ORDER — TORSEMIDE 20 MG PO TABS
20.0000 mg | ORAL_TABLET | Freq: Every day | ORAL | Status: DC
  Administered 2021-07-29: 20 mg via ORAL
  Filled 2021-07-28: qty 1

## 2021-07-28 MED ORDER — RIVAROXABAN 20 MG PO TABS
ORAL_TABLET | ORAL | Status: AC
  Filled 2021-07-28: qty 1

## 2021-07-28 MED ORDER — RIVAROXABAN 20 MG PO TABS
20.0000 mg | ORAL_TABLET | Freq: Every day | ORAL | Status: DC
  Administered 2021-07-28: 20 mg via ORAL

## 2021-07-28 MED ORDER — GADOBUTROL 1 MMOL/ML IV SOLN
8.0000 mL | Freq: Once | INTRAVENOUS | Status: AC | PRN
  Administered 2021-07-28: 8 mL via INTRAVENOUS

## 2021-07-28 MED ORDER — SACUBITRIL-VALSARTAN 49-51 MG PO TABS
1.0000 | ORAL_TABLET | Freq: Two times a day (BID) | ORAL | Status: DC
  Administered 2021-07-29: 1 via ORAL
  Filled 2021-07-28: qty 1

## 2021-07-28 MED ORDER — SACUBITRIL-VALSARTAN 49-51 MG PO TABS
ORAL_TABLET | ORAL | Status: AC
  Administered 2021-07-28: 1 via ORAL
  Filled 2021-07-28: qty 1

## 2021-07-28 NOTE — TOC Initial Note (Signed)
Transition of Care [facility]) - Initial/Assessment Note    Patient Details  Name: Troy May MRN: [id number] Date of Birth: 09-12-1950  [facility] CM/SW Contact:    Erenest Rasher, RN Phone Number: [phone] 07/28/2021, 2:56 PM  Clinical Narrative:                  HF TOC CM spoke to pt and wife at bedside. Pt is independent at home. No DME needed. Pt does not have PCP. Wife wanted CM to arrange appt at her PCP, Dr Unk Pinto. Office will give wife a call if they can accept patient for follow up.    Expected Discharge Plan: Home/Self Care Barriers to Discharge: No Barriers Identified   Patient Goals and CMS Choice        Expected Discharge Plan and Services Expected Discharge Plan: Home/Self Care   Discharge Planning Services: CM Consult   Living arrangements for the past 2 months: Single Family Home                   Prior Living Arrangements/Services Living arrangements for the past 2 months: Single Family Home Lives with:: Spouse          Need for Family Participation in Patient Care: No (Comment) Care giver support system in place?: No (comment)   Criminal Activity/Legal Involvement Pertinent to Current Situation/Hospitalization: No - Comment as needed  Activities of Daily Living Home Assistive Devices/Equipment: None ADL Screening (condition at time of admission) Patient's cognitive ability adequate to safely complete daily activities?: Yes Is the patient deaf or have difficulty hearing?: No Does the patient have difficulty seeing, even when wearing glasses/contacts?: No Does the patient have difficulty concentrating, remembering, or making decisions?: No Patient able to express need for assistance with ADLs?: Yes Does the patient have difficulty dressing or bathing?: No Independently performs ADLs?: Yes (appropriate for developmental age) Does the patient have difficulty walking or climbing stairs?: No Weakness of Legs:  None Weakness of Arms/Hands: None  Permission Sought/Granted Permission sought to share information with : Case Manager, Family Supports, PCP Permission granted to share information with : Yes, Verbal Permission Granted  Share Information with NAME: Troy May     Permission granted to share info w Relationship: wife  Permission granted to share info w Contact Information: [phone]  Emotional Assessment Appearance:: Appears stated age Attitude/Demeanor/Rapport: Gracious, Engaged Affect (typically observed): Accepting Orientation: : Oriented to Self, Oriented to Place, Oriented to  Time, Oriented to Situation   Psych Involvement: No (comment)  Admission diagnosis:  Elevated troponin [R77.8] NSTEMI (non-ST elevated myocardial infarction) ([place]) [I21.4] Influenza [J11.1] Hypertension, unspecified type [I10] Patient Active Problem List   Diagnosis Date Noted   NSTEMI (non-ST elevated myocardial infarction) ([place]) 07/25/2021   Influenza A 07/25/2021   Essential hypertension 07/25/2021   AKI (acute kidney injury) ([place]) 07/25/2021   Elevated troponin 07/25/2021   Acute combined systolic and diastolic heart failure ([place])    Left ventricular apical thrombus    PCP:  Pcp, No Pharmacy:   [id number] - [place], [place] [address] Phone: [phone] Fax: [phone]  [facility] [address] [place] [address] Phone: [phone] Fax: [phone]     Social Determinants of Health (SDOH) Interventions    Readmission Risk Interventions No flowsheet data found.

## 2021-07-28 NOTE — Progress Notes (Addendum)
Advanced Heart Failure Rounding Note  PCP-Cardiologist: None   Subjective:    Co-ox 66%.   At least 2.7L in UOP yesterday w/ IV Lasix + 1 unmeasured urinary occurrence. Wt down 8 lb. CVP 5  SCr stable 1.30>>1.39  K 3.8   BP remains elevated [id number] systolic.   Getting cMRI today. Results pending.   Feels well. No complaints today. Denies CP. No dyspnea.     [facility] 07/27/21   Ost RCA to Prox RCA lesion is 100% stenosed.   Prox Cx to Mid Cx lesion is 30% stenosed.   1st Mrg lesion is 20% stenosed.   Prox LAD lesion is 60% stenosed.   Mid LAD lesion is 90% stenosed.   Mid LAD to Dist LAD lesion is 100% stenosed.   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   Vital Signs:   Temp:  [97.5 F (36.4 C)-98.1 F (36.7 C)] 97.8 F (36.6 C) (11/29 0344) Pulse Rate:  [46-71] 56 (11/29 0344) Resp:  [17-23] 17 (11/29 0344) BP: (130-163)/(94-111) 153/97 (11/29 0344) SpO2:  [93 %-100 %] 100 % (11/29 0344) Weight:  [81.5 kg] 81.5 kg (11/29 0344) Last BM Date: 07/26/21  Weight change: Filed Weights   07/26/21 0535 07/27/21 0558 07/28/21 0344  Weight: 84.7 kg 85.1 kg 81.5 kg    Intake/Output:   Intake/Output Summary (Last 24 hours) at 07/28/2021 0959 Last data filed at 07/28/2021 0921 Gross per 24 hour  Intake 804.43 ml  Output 3100 ml  Net -2295.57 ml      Physical Exam    CVP 5  General:  Well appearing. No resp difficulty HEENT: Normal Neck: Supple. JVP not elevated . Carotids 2+ bilat; no bruits. No lymphadenopathy or thyromegaly appreciated. Cor: PMI nondisplaced. Regular rate & rhythm. No rubs, gallops or  murmurs. Lungs: Clear Abdomen: Soft, nontender, nondistended. No hepatosplenomegaly. No bruits or masses. Good bowel sounds. Extremities: No cyanosis, clubbing, rash, edema Neuro: Alert & orientedx3, cranial nerves grossly intact. moves all 4 extremities w/o difficulty. Affect pleasant   Telemetry   NSR 60s-70s personally reviewed   EKG    No new EKG to review   Labs    CBC Recent Labs    07/27/21 0257 07/27/21 1041 07/27/21 1050 07/28/21 0557  WBC 4.5  --   --  3.7*  HGB 14.3   < > 13.9 14.1  HCT 41.7   < > 41.0 42.4  MCV 93.3  --   --  94.6  PLT 102*  --   --  101*   < > = values in this interval not displayed.   Basic Metabolic Panel Recent Labs    07/27/21 0257 07/27/21 1041 07/27/21 1050 07/28/21 0557  NA 135   < > 138 138  K 4.2   < > 4.0 3.8  CL 104  --   --  103  CO2 23  --   --  28  GLUCOSE 91  --   --  98  BUN 17  --   --  20  CREATININE 1.30*  --   --  1.39*  CALCIUM 7.9*  --   --  8.1*  MG 2.0  --   --  2.1  PHOS 4.0  --   --   --    < > = values in this interval not displayed.   Liver Function Tests Recent Labs    07/27/21 0257  AST 43*  ALT 33  ALKPHOS 56  BILITOT 0.8  PROT 5.2*  ALBUMIN 2.6*   No results for input(s): LIPASE, AMYLASE in the last 72 hours. Cardiac Enzymes No results for input(s): CKTOTAL, CKMB, CKMBINDEX, TROPONINI in the last 72 hours.  BNP: BNP (last 3 results) Recent Labs    07/24/21 2021  BNP 1,111.1*    ProBNP (last 3 results) No results for input(s): PROBNP in the last 8760 hours.   D-Dimer No results for input(s): DDIMER in the last 72 hours. Hemoglobin A1C No results for input(s): HGBA1C in the last 72 hours. Fasting Lipid Panel No results for input(s): CHOL, HDL, LDLCALC, TRIG, CHOLHDL, LDLDIRECT in the last 72 hours. Thyroid Function Tests No results for input(s): TSH, T4TOTAL, T3FREE, THYROIDAB in the last 72 hours.  Invalid input(s): FREET3  Other results:   Imaging    CARDIAC  CATHETERIZATION  Result Date: 07/27/2021   Ost RCA to Prox RCA lesion is 100% stenosed.   Prox Cx to Mid Cx lesion is 30% stenosed.   1st Mrg lesion is 20% stenosed.   Prox LAD lesion is 60% stenosed.   Mid LAD lesion is 90% stenosed.   Mid LAD to Dist LAD lesion is 100% stenosed. 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 Continue diuresis. Titrate GDMT. Place PICC. Will need cMRI to assess viability of LAD territory but suspect minimal viability based on echo. Will discuss management of RCC with IR with possible embolization. Glori Bickers, MD 11:59 AM  [place] EKG SITE RITE  Result Date: 07/27/2021 If Site Rite image not attached, placement could not be confirmed due to current cardiac rhythm.    Medications:     Scheduled Medications:  aspirin EC  81 mg Oral Daily   atorvastatin  80 mg Oral Daily   Chlorhexidine Gluconate Cloth  6 each Topical Daily   empagliflozin  10 mg Oral Daily   furosemide  80 mg Intravenous BID   guaiFENesin  1,200 mg Oral BID   losartan  50 mg Oral Daily   oseltamivir  30 mg Oral BID   sodium chloride flush  10-40 mL Intracatheter Q12H   sodium chloride flush  3 mL Intravenous Q12H    Infusions:  sodium chloride     heparin 1,250 Units/hr (07/28/21 0740)    PRN Medications: sodium chloride, acetaminophen **OR** acetaminophen, ipratropium-albuterol, ondansetron **OR** ondansetron (ZOFRAN) IV, senna-docusate, sodium chloride flush, sodium chloride flush, sodium chloride HYPERTONIC    Assessment/Plan     Acute Biventricular Heart Failure/ Ischemic CM (new) - Echo EF 25-30%, RV moderately reduced, GIIDD - R/LHC w/ severe 2VCAD, elevated filling pressures (PCWP 21) and low  output, CI 1.7  - Diuresed w/ IV Lasix. Wt down 21 lb. CVP 5 today  -  Co-ox 66% today  - Stop IV Lasix. Start Torsemide 20 mg daily starting tomorrow  - ? blocker (Coreg) discontinued given low output  - Continue Jardiance 10 mg daily  - On Losartan 50 mg daily. Transition to Entresto 49-51 mg bid  - Consider adding Digoxin if renal fx remains stable   - cMRI completed to assess viability of LAD territory but suspect minimal viability based on echo. Interpretation pending.    2. CAD - Diagnostic LHC w/ severe 2V CAD, mid-distal LAD 100% stenosed, ost-prox RCA 100% stenosed  - cMRI to assess viability of LAD territory pending - medical therapy for now - heparin gtt - ASA 81 mg daily  - Added Atorva 80 nighlty (LDL 123) - Holding ? blocker due to low output HF    3. LV Thrombus  - 2/2 severe LV dysfunction  - on heparin gtt  - will transition to oral anticoagulant. PharmD to check cost of Eliquis    4. Bilateral Renal Masses, Suspicion for RCC - incidental finding on chest CT - right measuring 5.5 cm and the left measuring 3.8 cm - MR of Abdomen also concerning for RCC - D/w IR. No plans for embolization - Plan outpatient referral to Dr. Jeffie Pollock    5. Hypertension  - markedly hypertensive on admit, requiring labetalol  - BP now better but still moderately elevated  - Switch from losartan to [facility]  - off ? blocker w/ low output - May need Bidil    6. AKI  - SCr 1.56 on admit, baseline unknown  - 1.39 today, suspect cardiorenal/low output. Co-ox ok today at 66% - follow BMP    7. Influenza A - Tamiflu per primary team    8. HLD w/ LDL Goal < 70 - LDL elevated at 123 mg/dL. LFTs ok  - Atorva 80 mg nightly added  - will need f/u FLP in 6-8 wks    9. Thrombocytopenia  - plts 111>>103>>102>101  - ? Acute vs chronic. Baseline unknown.  - Hgb ok, 14 - follow CBC    10. Ascending Thoracic Aorta - 43 mm in diameter on chest CT - Tricuspid AoV on echo. No AI/AS - BP  control per above + statin  - will need annual scans for surveillance      Length of Stay: 3  Troy May  07/28/2021, 9:59 AM  Advanced Heart Failure Team Pager [phone] (M-F; 7a - 5p)  Please contact [facility] for night-coverage after hours (5p -7a ) and weekends on [email or web address]  Patient seen and examined with the above-signed Advanced Practice Provider and/or Housestaff. I personally reviewed laboratory data, imaging studies and relevant notes. I independently examined the patient and formulated the important aspects of the plan. I have edited the note to reflect any of my changes or salient points. I have personally discussed the plan with the patient and/or family.  Dyspnea much improved today. Denies CP, sob, orthopnea or PND. CVP 5. Co-ox 66%  I reviewed results of his cath with him and his wife. cMRI with EF 21% and larger inferior and apical scars with minimal viability in the RCA/LAD territories. Large LV clot.   General:  Well appearing. No resp difficulty HEENT: normal Neck: supple. no JVD. Carotids 2+ bilat; no bruits. No lymphadenopathy or thryomegaly appreciated. Cor: PMI nondisplaced. Regular rate & rhythm. No rubs, gallops or murmurs. Lungs: clear Abdomen: soft, nontender, nondistended. No hepatosplenomegaly. No bruits or masses. Good bowel sounds. Extremities: no cyanosis, clubbing, rash, edema  Neuro: alert & orientedx3, cranial nerves grossly intact. moves all 4 extremities w/o difficulty. Affect pleasant  He has severe iCM with no options for revascularization. He has also has a large LV clot. Need to switch heparin to DOAC. Titrate GDMT. Can stop IV diuretics. Suspect he will not need much loop diuretic but we will see.    I reviewed renal lesions with IR and they are not amenable to IR coiling. He will have f/u with Urology. He will need close f/u with HF clinic on d/c.   Glori Bickers, MD  10:46 PM

## 2021-07-28 NOTE — Progress Notes (Signed)
PROGRESS NOTE  Troy May WLN:[id number] DOB: 06-22-1951 DOA: 07/24/2021 PCP: Pcp, No  HPI/Recap of past 24 hours: Troy May is a 70 y.o. male with medical history significant for tobacco use disorder who presents to [facility] ED for evaluation of fever, chills, shortness of breath and cough for the last 2 days.  Associated with pleuritic chest pain worse with coughing.  No prior history of DVT or PE, no prolonged trips or immobilization.  Quit smoking about a month ago.    Work-up in the ED revealed influenza A infection for which he was started on Tamiflu.  Also revealed elevated troponin, a subsequent 2D echo revealed a large LV apical thrombus measuring 2.5 x 1.8 cm.  LVEF 25 to 30% with apical hypokinesis, regional wall motion abnormalities, grade 2 diastolic dysfunction.  He was started on heparin drip and seen by cardiology.  He had a CT angio chest which was negative for pulmonary embolism however revealed incidentally found bilateral renal masses.  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  Discussed with urology Dr. Jeffie Pollock who will arrange outpatient follow-up.  MRI abdomen also revealed cirrhosis.  Patient had a left and right heart cath on 07/27/2021 which showed acute biventricular heart failure/severe ischemic cardiomyopathy with low output, coronary artery disease with occluded LAD and RCA.  Heart failure team, Dr. Haroldine Laws, was consulted by cardiology and a PICC line was placed.  07/27/2021: Patient was seen and examined at his bedside.  His wife was present in the room.  He has no new complaints.    Assessment/Plan: Principal Problem:   NSTEMI (non-ST elevated myocardial infarction) ([place]) Active Problems:   Influenza A   Essential hypertension   AKI (acute kidney injury) ([place])   Elevated troponin   Acute combined systolic and diastolic heart failure (H[facility])   Left ventricular apical thrombus  Influenza A infection Presented  with fevers, nonproductive cough, chest discomfort. Respiratory viral panel positive for influenza A on 07/25/2021. Started on Tamiflu 30 mg on admission twice daily x5 days. Ongoing supportive care with antitussives as needed, bronchodilators as needed.  Currently not hypoxic. Maintain O2 saturation greater than 92%.  Newly diagnosed acute biventricular heart failure/ischemic cardiomyopathy post left and right heart cath 07/27/2021 Heart failure team consulted by cardiology PICC line placed on 07/27/2021 Currently on IV Lasix 80 mg twice daily, Entresto 49-51 mg 1 tablet twice daily Cardiology planning on starting torsemide on 07/29/2021  Newly diagnosed coronary artery disease with mid distal LAD 100% stenosed, ostial proximal RCA 100% stenosed Follow-up cardiac MRI, ordered on 07/28/2021 by heart failure to assess viability of LAD territory Is currently on aspirin 81 mg daily, Lipitor 80 mg daily, Jardiance 10 mg daily  Newly diagnosed bilateral renal masses, incidentally found, 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 Discussed with urology Dr. Jeffie Pollock, will arrange outpatient follow-up.  Newly diagnosed acute combined diastolic and systolic CHF Presented with elevated BNP greater than 1000 Cardiomegaly seen on chest x-ray Trace lower extremity edema bilaterally. 2D echo done on 07/25/2021 showed a large LV apical thrombus measuring 2.5 x 1.8 cm.  LVEF 25 to 30%, regional wall motion abnormalities, grade 2 diastolic dysfunction. Start strict I's and O's and daily weight.  Elevated troponin, multifactorial, secondary to apical thrombus, severe ischemic cardiomyopathy On heparin drip, started on 07/25/2021. Cardiology and heart failure team following. Apical thrombus, seen on 2D echo Heparin drip started on 07/25/2021, continue.  Leukopenia/thrombocytopenia Suspect in the setting of acute  illness/viral illness Continue to monitor  CBC  Hyperlipidemia Started on high intensity statin Lipitor 80 mg nightly.  Hypertension Management per cardiology Currently on Entresto and IV Lasix 80 mg twice daily Continue to closely monitor vital sign  AKI Presented with creatinine of 1.56 with GFR 47 Creatinine uptrending 1.39 from 1.33 from 1.28.   On presentation creatinine 1.56. Baseline creatinine appears to be 1.2 (07/25/21) with GFR greater than 60. Continue to avoid nephrotoxic agents, dehydration and hypotension Continue to closely monitor urine output with strict I's and O's  Ascending thoracic aorta aneurysm, seen on chest CT. Measuring 4.3 cm, former smoker Will need annual scans for surveillance.  Resolved hypovolemic hyponatremia  Cirrhosis, incidentally found on MRI abdomen with contrast Endorses light alcohol use maybe once a week. AST mildly elevated The rest of LFTs are normal Avoid hepatotoxic agents Monitor, would benefit from GI follow-up outpatient.  Former tobacco use disorder Quit tobacco use about a month ago.   Critical care time: 45 minutes.  Code Status: Full code  Family Communication: Wife at bedside  Disposition Plan: Likely will discharge to home once cardiology signs off.   Consultants: Cardiology  Procedures: 2D echo  Antimicrobials: Tamiflu  DVT prophylaxis: Heparin drip  Status is: Inpatient status.  Patient will require at least 2 midnights for further evaluation and treatment of present condition.       Objective: Vitals:   07/27/21 2042 07/28/21 0200 07/28/21 0344 07/28/21 1209  BP: (!) 147/101 (!) 163/111 (!) 153/97 (!) 158/97  Pulse: (!) 57 64 (!) 56 66  Resp: (!) 23 20 17 18   Temp: (!) 97.5 F (36.4 C) 98.1 F (36.7 C) 97.8 F (36.6 C) 97.9 F (36.6 C)  TempSrc: Oral Oral Oral Oral  SpO2: 100% 98% 100% 96%  Weight:   81.5 kg   Height:        Intake/Output Summary (Last 24 hours) at 07/28/2021 1447 Last data filed at 07/28/2021  3532 Gross per 24 hour  Intake 804.43 ml  Output 3100 ml  Net -2295.57 ml   Filed Weights   07/26/21 0535 07/27/21 0558 07/28/21 0344  Weight: 84.7 kg 85.1 kg 81.5 kg    Exam:  General: 70 y.o. year-old male well-developed well-nourished in no acute distress.  He is alert and oriented x3.   Cardiovascular: Regular rate and rhythm no rubs or gallops.  No JVD or thyromegaly noted. Respiratory: Clear to auscultation with no wheezes or rales.  Good inspiratory effort. Abdomen: Soft nontender normal bowel sounds present. Musculoskeletal: Trace lower extremity edema bilaterally. Skin: No ulcerative lesions noted.   Psychiatry: Mood is appropriate for condition and setting. Neuro: Moves all 4 extremities.  Nonfocal exam.   Data Reviewed: CBC: Recent Labs  Lab 07/24/21 2021 07/26/21 0238 07/27/21 0257 07/27/21 1041 07/27/21 1046 07/27/21 1050 07/28/21 0557  WBC 5.4 3.8* 4.5  --   --   --  3.7*  NEUTROABS 3.3  --   --   --   --   --   --   HGB 15.6 14.0 14.3 13.6 14.3 13.9 14.1  HCT 47.2 40.8 41.7 40.0 42.0 41.0 42.4  MCV 96.5 94.0 93.3  --   --   --  94.6  PLT 111* 103* 102*  --   --   --  992*   Basic Metabolic Panel: Recent Labs  Lab 07/24/21 2021 07/25/21 0248 07/26/21 0238 07/27/21 0257 07/27/21 1041 07/27/21 1046 07/27/21 1050 07/28/21 0557  NA 135 132* 134* 135  138 138 138 138  K 4.4 3.9 3.7 4.2 3.9 4.2 4.0 3.8  CL 101 101 101 104  --   --   --  103  CO2 27 23 27 23   --   --   --  28  GLUCOSE 110* 93 140* 91  --   --   --  98  BUN 19 18 16 17   --   --   --  20  CREATININE 1.56* 1.28* 1.33* 1.30*  --   --   --  1.39*  CALCIUM 8.2* 7.8* 7.6* 7.9*  --   --   --  8.1*  MG  --   --   --  2.0  --   --   --  2.1  PHOS  --   --   --  4.0  --   --   --   --    GFR: Estimated Creatinine Clearance: 51.1 mL/min (A) (by C-G formula based on SCr of 1.39 mg/dL (H)). Liver Function Tests: Recent Labs  Lab 07/27/21 0257  AST 43*  ALT 33  ALKPHOS 56  BILITOT 0.8   PROT 5.2*  ALBUMIN 2.6*   No results for input(s): LIPASE, AMYLASE in the last 168 hours. No results for input(s): AMMONIA in the last 168 hours. Coagulation Profile: No results for input(s): INR, PROTIME in the last 168 hours. Cardiac Enzymes: No results for input(s): CKTOTAL, CKMB, CKMBINDEX, TROPONINI in the last 168 hours. BNP (last 3 results) No results for input(s): PROBNP in the last 8760 hours. HbA1C: No results for input(s): HGBA1C in the last 72 hours. CBG: No results for input(s): GLUCAP in the last 168 hours. Lipid Profile: No results for input(s): CHOL, HDL, LDLCALC, TRIG, CHOLHDL, LDLDIRECT in the last 72 hours.  Thyroid Function Tests: No results for input(s): TSH, T4TOTAL, FREET4, T3FREE, THYROIDAB in the last 72 hours. Anemia Panel: No results for input(s): VITAMINB12, FOLATE, FERRITIN, TIBC, IRON, RETICCTPCT in the last 72 hours. Urine analysis:    Component Value Date/Time   COLORURINE YELLOW 07/25/2021 0256   APPEARANCEUR CLEAR 07/25/2021 0256   LABSPEC 1.018 07/25/2021 0256   PHURINE 5.0 07/25/2021 0256   GLUCOSEU NEGATIVE 07/25/2021 0256   HGBUR NEGATIVE 07/25/2021 0256   BILIRUBINUR NEGATIVE 07/25/2021 0256   KETONESUR NEGATIVE 07/25/2021 0256   PROTEINUR NEGATIVE 07/25/2021 0256   NITRITE NEGATIVE 07/25/2021 0256   LEUKOCYTESUR NEGATIVE 07/25/2021 0256   Sepsis Labs: @LABRCNTIP (procalcitonin:4,lacticidven:4)  ) Recent Results (from the past 240 hour(s))  Resp Panel by RT-PCR (Flu A&B, Covid) Nasopharyngeal Swab     Status: Abnormal   Collection Time: 07/24/21  7:56 PM   Specimen: Nasopharyngeal Swab; Nasopharyngeal(NP) swabs in vial transport medium  Result Value Ref Range Status   SARS Coronavirus 2 by RT PCR NEGATIVE NEGATIVE Final    Comment: (NOTE) SARS-CoV-2 target nucleic acids are NOT DETECTED.  The SARS-CoV-2 RNA is generally detectable in upper respiratory specimens during the acute phase of infection. The lowest concentration of  SARS-CoV-2 viral copies this assay can detect is 138 copies/mL. A negative result does not preclude SARS-Cov-2 infection and should not be used as the sole basis for treatment or other patient management decisions. A negative result may occur with  improper specimen collection/handling, submission of specimen other than nasopharyngeal swab, presence of viral mutation(s) within the areas targeted by this assay, and inadequate number of viral copies(<138 copies/mL). A negative result must be combined with clinical observations, patient history, and epidemiological information. The  expected result is Negative.  Fact Sheet for Patients:  EntrepreneurPulse.com.au  Fact Sheet for Healthcare Providers:  IncredibleEmployment.be  This test is no t yet approved or cleared by the [place] FDA and  has been authorized for detection and/or diagnosis of SARS-CoV-2 by FDA under an Emergency Use Authorization (EUA). This EUA will remain  in effect (meaning this test can be used) for the duration of the COVID-19 declaration under Section 564(b)(1) of the Act, 21 U.S.C.section 360bbb-3(b)(1), unless the authorization is terminated  or revoked sooner.       Influenza A by PCR POSITIVE (A) NEGATIVE Final   Influenza B by PCR NEGATIVE NEGATIVE Final    Comment: (NOTE) The Xpert Xpress SARS-CoV-2/FLU/RSV plus assay is intended as an aid in the diagnosis of influenza from Nasopharyngeal swab specimens and should not be used as a sole basis for treatment. Nasal washings and aspirates are unacceptable for Xpert Xpress SARS-CoV-2/FLU/RSV testing.  Fact Sheet for Patients: EntrepreneurPulse.com.au  Fact Sheet for Healthcare Providers: IncredibleEmployment.be  This test is not yet approved or cleared by the [place] FDA and has been authorized for detection and/or diagnosis of SARS-CoV-2 by FDA under an Emergency Use  Authorization (EUA). This EUA will remain in effect (meaning this test can be used) for the duration of the COVID-19 declaration under Section 564(b)(1) of the Act, 21 U.S.C. section 360bbb-3(b)(1), unless the authorization is terminated or revoked.  Performed at [facility], [place] [address] [place], [place] [address]       Studies: No results found.  Scheduled Meds:  aspirin EC  81 mg Oral Daily   atorvastatin  80 mg Oral Daily   Chlorhexidine Gluconate Cloth  6 each Topical Daily   empagliflozin  10 mg Oral Daily   furosemide  80 mg Intravenous BID   guaiFENesin  1,200 mg Oral BID   oseltamivir  30 mg Oral BID   sacubitril-valsartan  1 tablet Oral BID   sodium chloride flush  10-40 mL Intracatheter Q12H   sodium chloride flush  3 mL Intravenous Q12H   [START ON 07/29/2021] torsemide  20 mg Oral Daily    Continuous Infusions:  sodium chloride     heparin 1,250 Units/hr (07/28/21 0740)     LOS: 3 days     Kayleen Memos, MD [facility] Pager [phone]  If 7PM-7AM, please contact night-coverage [email or web address] Password [facility] 07/28/2021, 2:47 PM

## 2021-07-28 NOTE — Progress Notes (Signed)
ANTICOAGULATION CONSULT NOTE - Follow Up Consult  Pharmacy Consult for heparin Indication: apical thrombus on ECHO  No Known Allergies  Patient Measurements: Height: 5\' 10"  (177.8 cm) Weight: 81.5 kg (179 lb 11.2 oz) IBW/kg (Calculated) : 73 Heparin Dosing Weight: 90.7 kg  Vital Signs: Temp: 97.8 F (36.6 C) (11/29 0344) Temp Source: Oral (11/29 0344) BP: 153/97 (11/29 0344) Pulse Rate: 56 (11/29 0344)  Labs: Recent Labs    07/26/21 0238 07/26/21 1046 07/26/21 1807 07/27/21 0257 07/27/21 1041 07/27/21 1046 07/27/21 1050 07/28/21 0557  HGB 14.0  --   --  14.3   < > 14.3 13.9 14.1  HCT 40.8  --   --  41.7   < > 42.0 41.0 42.4  PLT 103*  --   --  102*  --   --   --  101*  HEPARINUNFRC 0.90*   < > 0.54 0.43  --   --   --  0.16*  CREATININE 1.33*  --   --  1.30*  --   --   --  1.39*   < > = values in this interval not displayed.     Estimated Creatinine Clearance: 51.1 mL/min (A) (by C-G formula based on SCr of 1.39 mg/dL (H)).   Medical History: Past Medical History:  Diagnosis Date   Ascending aortic aneurysm    Bilateral renal masses    CAD (coronary artery disease)    HLD (hyperlipidemia)    Hypertension    LV (left ventricular) mural thrombus    Systolic heart failure [facility])     Assessment:  70 yo M with new apical thrombus on ECHO. Was on lovenox 90mg  BID (treatment dosing) started by hospitalist team for NSTEMI. Last lovenox dose this AM @ 0224. Cardiology consulted - possible cath lab this week  Heparin was resumed last PM post cath for apical thrombus and CAD. Plan for cardiac MRI to assess LAD. Plan for PO anticoag once procedures are completed. Heparin level came back subtherapeutic this AM. We will adjust rate and recheck a level.   Hgb stable, plt low at 101k  Goal of Therapy:  Heparin level 0.3-0.7 units/ml Monitor platelets by anticoagulation protocol: Yes   Plan:  Increase heparin drip 1250 uts/hr  Check 6 hr HL Daily heparin level and  CBC   Onnie Boer, PharmD, BCIDP, AAHIVP, CPP Infectious Disease Pharmacist 07/28/2021 7:06 AM

## 2021-07-28 NOTE — Evaluation (Signed)
Physical Therapy Evaluation Patient Details Name: [name] MRN: [id number] DOB: 01-Apr-1951 Today's Date: 07/28/2021  History of Present Illness  70 y.o. male with no known medical history who presents with fevers, chills, shortness of breath.  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 an echocardiogram was obtained showing severe biventricular dysfunction. LVEF severely reduced, 25-30% w/ large LV apical thrombus measuring 2.5cm x 1.8 cm, GIIDD. RV moderately reduced. 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   Clinical Impression  Patient received sitting on side of bed. Family/friends present in room. Patient is agreeable to PT assessment. He is independent with transfers and ambulated 400 feet without AD, supervision, but no LOB or difficulties noted. Patient is at baseline level of function and does not require skilled PT at this time.            Recommendations for follow up therapy are one component of a multi-disciplinary discharge planning process, led by the attending physician.  Recommendations may be updated based on patient status, additional functional criteria and insurance authorization.  Follow Up Recommendations No PT follow up    Assistance Recommended at Discharge Intermittent Supervision/Assistance  Functional Status Assessment Patient has not had a recent decline in their functional status  Equipment Recommendations  None recommended by PT    Recommendations for Other Services       Precautions / Restrictions Precautions Precaution Comments: mod fall Restrictions Weight Bearing Restrictions: No      Mobility  Bed Mobility Overal bed mobility: Independent                  Transfers Overall  transfer level: Independent                      Ambulation/Gait Ambulation/Gait assistance: Supervision Gait Distance (Feet): 400 Feet Assistive device: None Gait Pattern/deviations: Step-through pattern Gait velocity: Normal     General Gait Details: no LOB or difficulties with ambulation.  Stairs            Wheelchair Mobility    Modified Rankin (Stroke Patients Only)       Balance Overall balance assessment: Independent                                           Pertinent Vitals/Pain Pain Assessment: No/denies pain    Home Living Family/patient expects to be discharged to:: Private residence Living Arrangements: Spouse/significant other Available Help at Discharge: Family;Available 24 hours/day Type of Home: House Home Access: Stairs to enter     Alternate Level Stairs-Number of Steps: flight Home Layout: Two level;Bed/bath upstairs Home Equipment: None      Prior Function Prior Level of Function : Independent/Modified Independent                     Hand Dominance        Extremity/Trunk Assessment   Upper Extremity Assessment Upper Extremity Assessment: Overall WFL for tasks assessed    Lower Extremity Assessment Lower Extremity Assessment: Overall WFL for tasks assessed    Cervical / Trunk Assessment Cervical / Trunk Assessment: Normal  Communication   Communication: No difficulties  Cognition Arousal/Alertness: Awake/alert Behavior During Therapy: WFL for tasks assessed/performed Overall Cognitive Status: Within Functional Limits for tasks assessed                                          General Comments      Exercises     Assessment/Plan    PT Assessment Patient does not need any further PT services  PT Problem List         PT Treatment Interventions      PT Goals (Current goals can be found in the Care Plan section)  Acute Rehab PT Goals Patient Stated Goal: to go  home PT Goal Formulation: With patient/family Time For Goal Achievement: 07/29/21 Potential to Achieve Goals: Good    Frequency     Barriers to discharge        Co-evaluation               AM-PAC PT "6 Clicks" Mobility  Outcome Measure Help needed turning from your back to your side while in a flat bed without using bedrails?: None Help needed moving from lying on your back to sitting on the side of a flat bed without using bedrails?: None Help needed moving to and from a bed to a chair (including a wheelchair)?: None Help needed standing up from a chair using your arms (e.g., wheelchair or bedside chair)?: None Help needed to walk in hospital room?: None Help needed climbing 3-5 steps with a railing? : None 6 Click Score: 24    End of Session   Activity Tolerance: Patient tolerated treatment well Patient left: in bed Nurse Communication: Mobility status      Time: 1552-0802 PT Time Calculation (min) (ACUTE ONLY): 13 min   Charges:   PT Evaluation $PT Eval Moderate Complexity: 1 Mod          Yahia Bottger, PT, GCS 07/28/21,2:04 PM

## 2021-07-28 NOTE — Evaluation (Signed)
Occupational Therapy Evaluation Patient Details Name: Troy May MRN: [id number] DOB: March 27, 1951 Today's Date: 07/28/2021   History of Present Illness 70 y.o. male with no known medical history who presents with fevers, chills, shortness of breath.  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 an echocardiogram was obtained showing severe biventricular dysfunction. LVEF severely reduced, 25-30% w/ large LV apical thrombus measuring 2.5cm x 1.8 cm, GIIDD. RV moderately reduced. 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   Clinical Impression   Pt seen with wife in room, received standing at sink completing self care/grooming tasks. No LOB during ADLs in standing or sitting, dynamic balance WFL. Pt is indep with all ADLs and mobility at baseline. Demo's ability to complete functional mobility in room, self care transfers and all ADLs at indep level of performance. Including item retrieval and transport. No acute or post acute OT services indicated at this time. OT will sign off.       Recommendations for follow up therapy are one component of a multi-disciplinary discharge planning process, led by the attending physician.  Recommendations may be updated based on patient status, additional functional criteria and insurance authorization.   Follow Up Recommendations  No OT follow up    Assistance Recommended at Discharge PRN  Functional Status Assessment     Equipment Recommendations       Recommendations for Other Services       Precautions / Restrictions Precautions Precaution Comments: mod fall Restrictions Weight Bearing Restrictions: No      Mobility Bed Mobility Overal bed mobility: Independent                   Transfers Overall transfer level: Independent                        Balance Overall balance assessment: Independent                                         ADL either performed or assessed with clinical judgement   ADL Overall ADL's : Independent                                             Vision Baseline Vision/History: 0 No visual deficits       Perception     Praxis      Pertinent Vitals/Pain Pain Assessment: No/denies pain     Hand Dominance     Extremity/Trunk Assessment Upper Extremity Assessment Upper Extremity Assessment: Overall WFL for tasks assessed   Lower Extremity Assessment Lower Extremity Assessment: Overall WFL for tasks assessed   Cervical / Trunk Assessment Cervical / Trunk Assessment: Normal   Communication Communication Communication: No difficulties   Cognition Arousal/Alertness: Awake/alert Behavior During Therapy: WFL for tasks assessed/performed Overall Cognitive Status: Within Functional Limits for tasks assessed  General Comments  none to note    Exercises     Shoulder Instructions      Home Living Family/patient expects to be discharged to:: Private residence Living Arrangements: Spouse/significant other Available Help at Discharge: Family;Available 24 hours/day Type of Home: House Home Access: Stairs to enter     Home Layout: Two level;Bed/bath upstairs Alternate Level Stairs-Number of Steps: flight             Home Equipment: None          Prior Functioning/Environment Prior Level of Function : Independent/Modified Independent                        OT Problem List:        OT Treatment/Interventions:      OT Goals(Current goals can be found in the care plan section) Acute Rehab OT Goals Patient Stated Goal: to go home OT Goal Formulation: With patient  OT Frequency:     Barriers to D/C:             Co-evaluation              AM-PAC OT "6 Clicks" Daily Activity     Outcome Measure Help from another person eating meals?: None Help from another person taking care of personal grooming?: None Help from another person toileting, which includes using toliet, bedpan, or urinal?: None Help from another person bathing (including washing, rinsing, drying)?: None Help from another person to put on and taking off regular upper body clothing?: None Help from another person to put on and taking off regular lower body clothing?: None 6 Click Score: 24   End of Session Nurse Communication: Mobility status  Activity Tolerance: Patient tolerated treatment well Patient left: with family/visitor present;with call bell/phone within reach                   Time: 1428-1440 OT Time Calculation (min): 12 min Charges:  OT General Charges $OT Visit: 1 Visit OT Evaluation $OT Eval Low Complexity: 1 Low  Amin Fornwalt OTR/L acute rehab services Office: [phone]  07/28/2021, 2:40 PM

## 2021-07-28 NOTE — Progress Notes (Signed)
ANTICOAGULATION CONSULT NOTE - Follow Up Consult  Pharmacy Consult for heparin > change to Xarelto Indication: apical thrombus on ECHO  No Known Allergies  Patient Measurements: Height: 5\' 10"  (177.8 cm) Weight: 81.5 kg (179 lb 11.2 oz) IBW/kg (Calculated) : 73 Heparin Dosing Weight: 90.7 kg  Vital Signs: Temp: 97.9 F (36.6 C) (11/29 1209) Temp Source: Oral (11/29 1209) BP: 158/97 (11/29 1209) Pulse Rate: 66 (11/29 1209)  Labs: Recent Labs    07/26/21 0238 07/26/21 1046 07/27/21 0257 07/27/21 1041 07/27/21 1046 07/27/21 1050 07/28/21 0557 07/28/21 1447  HGB 14.0  --  14.3   < > 14.3 13.9 14.1  --   HCT 40.8  --  41.7   < > 42.0 41.0 42.4  --   PLT 103*  --  102*  --   --   --  101*  --   HEPARINUNFRC 0.90*   < > 0.43  --   --   --  0.16* 0.48  CREATININE 1.33*  --  1.30*  --   --   --  1.39*  --    < > = values in this interval not displayed.     Estimated Creatinine Clearance: 51.1 mL/min (A) (by C-G formula based on SCr of 1.39 mg/dL (H)).   Medical History: Past Medical History:  Diagnosis Date   Ascending aortic aneurysm    Bilateral renal masses    CAD (coronary artery disease)    HLD (hyperlipidemia)    Hypertension    LV (left ventricular) mural thrombus    Systolic heart failure [facility])     Assessment:  70 yo M with new apical thrombus on ECHO. Was on lovenox 90mg  BID (treatment dosing) started by hospitalist team for NSTEMI. Last lovenox dose this AM @ 0224. Cardiology consulted - possible cath lab this week  Heparin level slightly below goal this afternoon.  Asked by HF team to convert to oral anticoagulation today.  Hgb stable, plt low at 101k  Goal of Therapy:  Heparin level 0.3-0.7 units/ml Monitor platelets by anticoagulation protocol: Yes   Plan:  Stop IV heparin Start Xarelto 20 mg daily this evening (Xarelto can be covered by HF fund, Eliquis is not). Will continue to work on patient assistance, Medicare part D coverage.  [place] Crane, Roylene Reason, BCCP Clinical Pharmacist  07/28/2021 3:33 PM   [facility] pharmacy phone numbers are listed on [email or web address]

## 2021-07-28 NOTE — Progress Notes (Signed)
Pt extremely fidgety during this shift, constantly "adjusting" IV lines (CVP), IV pole, telemetry leads, and O2 sensors. Pt is alert and oriented x4. Wife stays with pt overnight and is getting frustrated by his actions.

## 2021-07-28 NOTE — Progress Notes (Signed)
PT Cancellation Note  Patient Details Name: Jairus Tonne MRN: [id number] DOB: 05-Oct-1950   Cancelled Treatment:    Reason Eval/Treat Not Completed: Patient at procedure or test/unavailable. Patient off floor to MRI. Will re-attempt later today.    Elainna Eshleman 07/28/2021, 11:02 AM

## 2021-07-29 ENCOUNTER — Other Ambulatory Visit (HOSPITAL_COMMUNITY): Payer: Self-pay

## 2021-07-29 DIAGNOSIS — I214 Non-ST elevation (NSTEMI) myocardial infarction: Secondary | ICD-10-CM | POA: Diagnosis not present

## 2021-07-29 LAB — BASIC METABOLIC PANEL
Anion gap: 11 (ref 5–15)
BUN: 19 mg/dL (ref 8–23)
CO2: 27 mmol/L (ref 22–32)
Calcium: 8.3 mg/dL — ABNORMAL LOW (ref 8.9–10.3)
Chloride: 98 mmol/L (ref 98–111)
Creatinine, Ser: 1.58 mg/dL — ABNORMAL HIGH (ref 0.61–1.24)
GFR, Estimated: 47 mL/min — ABNORMAL LOW (ref >60)
Glucose, Bld: 109 mg/dL — ABNORMAL HIGH (ref 70–99)
Potassium: 3.4 mmol/L — ABNORMAL LOW (ref 3.5–5.1)
Sodium: 136 mmol/L (ref 135–145)

## 2021-07-29 LAB — MAGNESIUM: Magnesium: 2 mg/dL (ref 1.7–2.4)

## 2021-07-29 LAB — COOXEMETRY PANEL
Carboxyhemoglobin: 1.4 % (ref 0.5–1.5)
Methemoglobin: 0.8 % (ref 0.0–1.5)
O2 Saturation: 55.4 %
Total hemoglobin: 15.9 g/dL (ref 12.0–16.0)

## 2021-07-29 LAB — CBC
HCT: 46.3 % (ref 39.0–52.0)
Hemoglobin: 15.6 g/dL (ref 13.0–17.0)
MCH: 31.4 pg (ref 26.0–34.0)
MCHC: 33.7 g/dL (ref 30.0–36.0)
MCV: 93.2 fL (ref 80.0–100.0)
Platelets: 117 10*3/uL — ABNORMAL LOW (ref 150–400)
RBC: 4.97 MIL/uL (ref 4.22–5.81)
RDW: 14.1 % (ref 11.5–15.5)
WBC: 3.6 10*3/uL — ABNORMAL LOW (ref 4.0–10.5)
nRBC: 0 % (ref 0.0–0.2)

## 2021-07-29 MED ORDER — ASPIRIN 81 MG PO TBEC
81.0000 mg | DELAYED_RELEASE_TABLET | Freq: Every day | ORAL | 11 refills | Status: DC
  Filled 2021-07-29: qty 30, 30d supply, fill #0

## 2021-07-29 MED ORDER — POTASSIUM CHLORIDE CRYS ER 20 MEQ PO TBCR
20.0000 meq | EXTENDED_RELEASE_TABLET | Freq: Every day | ORAL | 1 refills | Status: DC
  Filled 2021-07-29: qty 30, 30d supply, fill #0

## 2021-07-29 MED ORDER — ATORVASTATIN CALCIUM 80 MG PO TABS
80.0000 mg | ORAL_TABLET | Freq: Every day | ORAL | 1 refills | Status: DC
  Filled 2021-07-29: qty 30, 30d supply, fill #0

## 2021-07-29 MED ORDER — EMPAGLIFLOZIN 10 MG PO TABS
10.0000 mg | ORAL_TABLET | Freq: Every day | ORAL | 1 refills | Status: DC
Start: 2021-07-29 — End: 2021-08-05
  Filled 2021-07-29: qty 30, 30d supply, fill #0

## 2021-07-29 MED ORDER — TORSEMIDE 20 MG PO TABS: 20.0000 mg | ORAL_TABLET | Freq: Every day | ORAL | Status: DC

## 2021-07-29 MED ORDER — POTASSIUM CHLORIDE CRYS ER 20 MEQ PO TBCR
40.0000 meq | EXTENDED_RELEASE_TABLET | Freq: Once | ORAL | Status: AC
  Administered 2021-07-29: 40 meq via ORAL
  Filled 2021-07-29: qty 2

## 2021-07-29 MED ORDER — SACUBITRIL-VALSARTAN 49-51 MG PO TABS
1.0000 | ORAL_TABLET | Freq: Two times a day (BID) | ORAL | 1 refills | Status: DC
Start: 2021-07-29 — End: 2021-08-05
  Filled 2021-07-29: qty 60, 30d supply, fill #0

## 2021-07-29 MED ORDER — RIVAROXABAN 20 MG PO TABS
20.0000 mg | ORAL_TABLET | Freq: Every day | ORAL | 1 refills | Status: DC
  Filled 2021-07-29: qty 30, 30d supply, fill #0

## 2021-07-29 MED ORDER — TORSEMIDE 20 MG PO TABS
20.0000 mg | ORAL_TABLET | Freq: Every day | ORAL | 1 refills | Status: DC
  Filled 2021-07-29: qty 30, 30d supply, fill #0

## 2021-07-29 MED FILL — Heparin Sod (Porcine)-NaCl IV Soln 1000 Unit/500ML-0.9%: INTRAVENOUS | Qty: 1000 | Status: AC

## 2021-07-29 NOTE — Progress Notes (Addendum)
Advanced Heart Failure Rounding Note  PCP-Cardiologist: None   Subjective:    Co-ox marginal at 55%  Is/Os likely not accurate. Weight down another 7 lb overnight, 28 lb since admit.   CVP 3-4.   SCr trending up slightly, 1.3 > 1.39 > 1.58  K 3.4, Mag 2.0.  BP improving with GDMT   Denies dyspnea. Still with productive cough. Felt good ambulating halls yesterday.   Wondering when he can go home.    cMRI 07/28/21 LVEF 21%, LAD and RCA territories with scar and do not appear viable, large LV thrombus   [facility] 07/27/21   Ost RCA to Prox RCA lesion is 100% stenosed.   Prox Cx to Mid Cx lesion is 30% stenosed.   1st Mrg lesion is 20% stenosed.   Prox LAD lesion is 60% stenosed.   Mid LAD lesion is 90% stenosed.   Mid LAD to Dist LAD lesion is 100% stenosed.   Findings:   Ao = 143/88 (109)  LV = 146/25 RA =  8 RV = 41/12 PA = 45/20 (29) PCW = 21 Fick cardiac output/index = 3.4/1.7 PVR = 2.4 WU SVR = 2383  FA sat = 95% PA sat = 54%, 57% PAPi 3.125    Assessment: Severe 2v CAD with occluded LAD and RCA  Severe iCM EF 25% Elevated filling pressures with low output     Objective:   Weight Range: 78.4 kg Body mass index is 24.81 kg/m.   Vital Signs:   Temp:  [97.9 F (36.6 C)-98.9 F (37.2 C)] 98.9 F (37.2 C) (11/30 0414) Pulse Rate:  [65-75] 75 (11/30 0414) Resp:  [17-18] 17 (11/30 0414) BP: (120-158)/(89-105) 120/89 (11/30 0414) SpO2:  [92 %-96 %] 92 % (11/30 0414) Weight:  [78.4 kg] 78.4 kg (11/30 0414) Last BM Date: 07/27/21  Weight change: Filed Weights   07/27/21 0558 07/28/21 0344 07/29/21 0414  Weight: 85.1 kg 81.5 kg 78.4 kg    Intake/Output:   Intake/Output Summary (Last 24 hours) at 07/29/2021 0907 Last data filed at 07/29/2021 0850 Gross per 24 hour  Intake 956 ml  Output 1150 ml  Net -194 ml      Physical Exam    CVP 3-4 General:  No distress sitting on side of bed HEENT: normal Neck: no JVD. Carotids 2+ bilat;  no bruits. No lymphadenopathy or thryomegaly appreciated. Cor: PMI nondisplaced. Regular rate & rhythm. No rubs, gallops or murmurs. Lungs: clear Abdomen: soft, nontender, nondistended. No hepatosplenomegaly.  Extremities: no cyanosis, clubbing, rash, edema Neuro: alert & orientedx3, cranial nerves grossly intact. moves all 4 extremities w/o difficulty. Affect pleasant    Telemetry   SR 70s (personally reviewed)   Labs    CBC Recent Labs    07/28/21 0557 07/29/21 0500  WBC 3.7* 3.6*  HGB 14.1 15.6  HCT 42.4 46.3  MCV 94.6 93.2  PLT 101* 253*   Basic Metabolic Panel Recent Labs    07/27/21 0257 07/27/21 1041 07/28/21 0557 07/29/21 0500  NA 135   < > 138 136  K 4.2   < > 3.8 3.4*  CL 104  --  103 98  CO2 23  --  28 27  GLUCOSE 91  --  98 109*  BUN 17  --  20 19  CREATININE 1.30*  --  1.39* 1.58*  CALCIUM 7.9*  --  8.1* 8.3*  MG 2.0  --  2.1 2.0  PHOS 4.0  --   --   --    < > =  values in this interval not displayed.   Liver Function Tests Recent Labs    07/27/21 0257  AST 43*  ALT 33  ALKPHOS 56  BILITOT 0.8  PROT 5.2*  ALBUMIN 2.6*   No results for input(s): LIPASE, AMYLASE in the last 72 hours. Cardiac Enzymes No results for input(s): CKTOTAL, CKMB, CKMBINDEX, TROPONINI in the last 72 hours.  BNP: BNP (last 3 results) Recent Labs    07/24/21 2021  BNP 1,111.1*    ProBNP (last 3 results) No results for input(s): PROBNP in the last 8760 hours.   D-Dimer No results for input(s): DDIMER in the last 72 hours. Hemoglobin A1C No results for input(s): HGBA1C in the last 72 hours. Fasting Lipid Panel No results for input(s): CHOL, HDL, LDLCALC, TRIG, CHOLHDL, LDLDIRECT in the last 72 hours. Thyroid Function Tests No results for input(s): TSH, T4TOTAL, T3FREE, THYROIDAB in the last 72 hours.  Invalid input(s): FREET3  Other results:   Imaging    MR CARDIAC MORPHOLOGY W WO CONTRAST  Result Date: 07/28/2021 CLINICAL DATA:  Clinical  question of cardiomyopathy Study assumes HCT of 42 and BSA of 2.01 m2. EXAM: CARDIAC MRI TECHNIQUE: The patient was scanned on a 1.5 Tesla GE magnet. A dedicated cardiac coil was used. Functional imaging was done using Fiesta sequences. 2,3, and 4 chamber views were done to assess for RWMA's. Modified Simpson's rule using a short axis stack was used to calculate an ejection fraction on a dedicated work Conservation officer, nature. The patient received 8 cc of Gadavist. After 10 minutes inversion recovery sequences were used to assess for infiltration and scar tissue. CONTRAST:  8 cc  of Gadavist FINDINGS: 1. Severely dilated left ventricular size, with LVEDD 67 mm, but LVEDVi 163 mL/m2. Moderate concentric hypertrophy, with intraventricular septal thickness of 12 mm, posterior wall thickness of 7 mm, and myocardial mass index of 108 g/m2. Severely reduced left ventricular systolic function (LVEF =16 %). There are regional wall motion abnormalities: Anteroseptal, inferoseptal, inferior, and apical wall motion abnormalities; otherwise global hypokinesis. Left ventricular parametric mapping notable for Normal T2 and significant elevation apical ECV signal (50-70%). There is late gadolinium enhancement in the left ventricular myocardium. 75-100% transmural LGE in the mid and basal inferior and inferoseptal, transmural apical LGE. There is a large LV thrombus. 2.  Mildly increased right ventricular size with RVEDVI 117 mL/m2. Normal right ventricular thickness. Moderately reduced right ventricular systolic function (RVEF =10%). There are no regional wall motion abnormalities or aneurysms. 3.  Normal left and right atrial size. 4.  Normal size of the aortic root. Mild ascending aortic aneurysm 41 mm (black blood imaging not obtained). 5. Valve assessment: Aortic Valve: Tri-leaflet aortic valve. Regurgitant fraction 7% mean gradient 6 mmHg. Mild aortic regurgitation. Pulmonic Valve: Qualitatively mild regurgitation.  Tricuspid Valve: Qualitatively mild regurgitation. Mitral Valve: Regurgitant fraction 9% (Aortic-Mitral evaluation). Mild mitral regurgitation. 6.  Normal pericardium.  No pericardial effusion. 7. Grossly, no extracardiac findings. Recommended dedicated study if concerned for non-cardiac pathology. 8. Breath hold artifacts noted. This decreased the sensitivity of LV and RV function evaluation, and valve quantification. IMPRESSION: Severe LV dysfunction. LAD and RCA territories with scar due not appear viable. Large LV thrombus. Rudean Haskell MD Electronically Signed   By: Rudean Haskell M.D.   On: 07/28/2021 16:44     Medications:     Scheduled Medications:  aspirin EC  81 mg Oral Daily   atorvastatin  80 mg Oral Daily   Chlorhexidine Gluconate Cloth  6 each Topical Daily   empagliflozin  10 mg Oral Daily   furosemide  80 mg Intravenous BID   oseltamivir  30 mg Oral BID   rivaroxaban  20 mg Oral Q supper   sacubitril-valsartan  1 tablet Oral BID   sodium chloride flush  10-40 mL Intracatheter Q12H   sodium chloride flush  3 mL Intravenous Q12H   torsemide  20 mg Oral Daily    Infusions:  sodium chloride      PRN Medications: sodium chloride, acetaminophen **OR** acetaminophen, ipratropium-albuterol, ondansetron **OR** ondansetron (ZOFRAN) IV, senna-docusate, sodium chloride flush, sodium chloride flush, sodium chloride HYPERTONIC    Assessment/Plan     Acute Biventricular Heart Failure/ Ischemic CM (new) - Echo EF 25-30%, RV moderately reduced, GIIDD - R/LHC w/ severe 2VCAD, elevated filling pressures (PCWP 21) and low output, CI 1.7  - Diuresed w/ IV Lasix. Weight down another 7 lb overnight, down 28 lb from admit. Is/Os likely not accurate.  CVP 3-4.  - Co-ox marginal at 55% today.  - Stop IV lasix. Already received one dose this am. Hold further diuresis today. Start po Torsemide 20 mg daily tomorrow. - ? blocker (Coreg) discontinued given low output  -  Continue Jardiance 10 mg daily  - Continue entresto 49/51 mg BID - Consider adding spiro as outpatient if Cr stable on f/u labs - cMRI with EF 21% and minimal viability in RCA and LAD territories   2. CAD - Diagnostic LHC w/ severe 2V CAD, mid-distal LAD 100% stenosed, ost-prox RCA 100% stenosed  - cMRI with EF 21% and larger inferior and apical scars with minimal viability in the RCA/LAD territories. Large LV clot.  - No options for revascularization - medical therapy for now - Denies CP - ASA 81 mg daily  - continue Atorvastatin 80 nighlty (LDL 123) - Holding ? blocker due to low output HF    3. LV Thrombus  - 2/2 severe LV dysfunction  - Continue Xarelto 20 mg daily, CrCl 45 ml/min today. Has been > 50 otherwise. Need to consider cutting back to 15 mg daily if consistently < 50 ml/min   4. Bilateral Renal Masses, Suspicion for RCC - incidental finding on chest CT - right measuring 5.5 cm and the left measuring 3.8 cm - MR of Abdomen also concerning for RCC - D/w IR. Not amenable to coiling - Plan outpatient referral to Dr. Jeffie Pollock    5. Hypertension  - markedly hypertensive on admit, requiring labetalol  - BP now improved with GDMT - Continue entresto - off ? blocker w/ low output - consider adding spiro as outpatient if Scr remains stable   6. AKI  - SCr 1.56 on admit, baseline unknown  - down to 1.30, but back up to 1.58 today suspect cardiorenal/low output in addition to diuresis.  - Co-ox marginal today at 55%.  - follow BMP closely after discharge   7. Influenza A - Tamiflu per primary team    8. HLD w/ LDL Goal < 70 - LDL elevated at 123 mg/dL. LFTs ok  - Atorva 80 mg nightly added  - will need f/u FLP in 6-8 wks    9. Thrombocytopenia  - plts 111>>103>>102>101>111 - ? Acute vs chronic. Baseline unknown.  - Hgb ok, 14 - follow CBC, recheck at 1 week f/u   10. Ascending Thoracic Aortic Aneurysm - 43 mm in diameter on chest CT - Tricuspid AoV on echo. No  AI/AS - BP control per above +  statin  - will need annual scans for surveillance    11. Hypokalemia - K 3.4.  Replace today.  - Mag 2.0   Okay for discharge today from HF perspective. Discussed with Dr. Haroldine Laws.  F/u in HF clinic scheduled.  Cardiac Medications: Medications to TOC at discharge Aspirin 81 mg Atorvastatin 80 - HF fund Jardiance 10 mg daily - HF fund Xarelto 20 mg daily - HF fund Entresto 49/51 mg BID - 30 day free card Torsemide 20 mg daily starting 12/01 - HF fund Potassium 20 mEq daily - HF fund  BMET/CBC at f/u in 1 week  Length of Stay: [place], LINDSAY N, PA-C  07/29/2021, 9:07 AM  Advanced Heart Failure Team Pager [phone] (M-F; 7a - 5p)  Please contact [facility] for night-coverage after hours (5p -7a ) and weekends on [email or web address]  Patient seen and examined with the above-signed Advanced Practice Provider and/or Housestaff. I personally reviewed laboratory data, imaging studies and relevant notes. I independently examined the patient and formulated the important aspects of the plan. I have edited the note to reflect any of my changes or salient points. I have personally discussed the plan with the patient and/or family.  He is well diuresed. Co-ox stable. SCr slightly elevated likely due to overdiuresis.  He is very eager to go home.   I think he is stable for d/c. Will get close f/u in HF Clinic.   Not candidate for advanced therapies with renal malignancies.   Agree with meds as above.   Glori Bickers, MD  7:39 PM

## 2021-07-29 NOTE — Progress Notes (Signed)
Mobility Specialist Progress Note:   07/29/21 1126  Mobility  Activity Ambulated in hall  Level of Assistance Independent  Assistive Device None  Distance Ambulated (ft) 520 ft  Mobility Ambulated with assistance in hallway  Mobility Response Tolerated well  Mobility performed by Mobility specialist  $Mobility charge 1 Mobility   Pt received in bed willing to participate in mobility. No complaints of pain. Pt returned to bed with call bell in reach, all needs met and wife present.   [facility] Public librarian Phone [phone] Secondary Phone [phone]

## 2021-07-29 NOTE — TOC CM/SW Note (Addendum)
HF TOC CM spoke to pt and wife at bedside. Pt received his medications at bedside. Wife states his new [facility] Medicare plan starts on 07/30/2021. Explained her PCP's office did not have any new pt appts, but will send to providers to possible work him. Explained to call to follow up to see if they can arrange appt with new PCP. [place], Heart Failure TOC CM [phone]

## 2021-07-29 NOTE — Progress Notes (Signed)
IV Team consult placed with note to remove PICC. Attempted to reach Primary RN via phone, spoke with CN Tai who was advised we need a PICC line discontinue order

## 2021-07-29 NOTE — Care Management Important Message (Signed)
Important Message  Patient Details  Name: Troy May MRN: [id number] Date of Birth: May 22, 1951   Medicare Important Message Given:  Yes     Shelda Altes 07/29/2021, 10:09 AM

## 2021-07-29 NOTE — Discharge Instructions (Addendum)
Information on my medicine - XARELTO (rivaroxaban)  WHY WAS XARELTO PRESCRIBED FOR YOU? 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  What do you need to know about Xarelto? You will be taking one 20 mg tablet taken ONCE A DAY with your evening meal.  DO NOT stop taking Xarelto without talking to the health care provider who prescribed the medication.  Refill your prescription for 20 mg tablets before you run out.  After discharge, you should have regular check-up appointments with your healthcare provider that is prescribing your Xarelto.  In the future your dose may need to be changed if your kidney function changes by a significant amount.  What do you do if you miss a dose? If you are taking Xarelto TWICE DAILY and you miss a dose, take it as soon as you remember. You may take two 15 mg tablets (total 30 mg) at the same time then resume your regularly scheduled 15 mg twice daily the next day.  If you are taking Xarelto ONCE DAILY and you miss a dose, take it as soon as you remember on the same day then continue your regularly scheduled once daily regimen the next day. Do not take two doses of Xarelto at the same time.   Important Safety Information Xarelto is a blood thinner medicine that can cause bleeding. You should call your healthcare provider right away if you experience any of the following: Bleeding from an injury or your nose that does not stop. Unusual colored urine (red or dark brown) or unusual colored stools (red or black). Unusual bruising for unknown reasons. A serious fall or if you hit your head (even if there is no bleeding).  Some medicines may interact with Xarelto and might increase your risk of bleeding while on Xarelto. To help avoid this, consult your healthcare provider or pharmacist prior to  using any new prescription or non-prescription medications, including herbals, vitamins, non-steroidal anti-inflammatory drugs (NSAIDs) and supplements.  This website has more information on Xarelto: [email or web address]

## 2021-07-29 NOTE — Discharge Summary (Signed)
Physician Discharge Summary  Troy May RCV:[id number] DOB: [birth year] DOA: 07/24/2021  PCP: Merryl Hacker, No  Admit date: 07/24/2021 Discharge date: 07/29/2021  Admitted From: home Discharge disposition: home   Recommendations for Outpatient Follow-Up:   Close outpatient follow up with CHF clinic Referral to GI outpatient for cirrhosis  Follow up on : ascending thoracic aorta aneurysm, seen on chest CT BMP 1 week Cardiac Medications: Aspirin 81 mg Atorvastatin 80 - HF fund Jardiance 10 mg daily - HF fund Xarelto 20 mg daily - HF fund Entresto 49/51 mg BID - 30 day free card Torsemide 20 mg daily starting 12/01 - HF fund Potassium 20 mEq daily - HF fund   Discharge Diagnosis:   Principal Problem:   NSTEMI (non-ST elevated myocardial infarction) ([place]) Active Problems:   Influenza A   Essential hypertension   AKI (acute kidney injury) ([place])   Elevated troponin   Acute combined systolic and diastolic heart failure (HCC)   Left ventricular apical thrombus    Discharge Condition: Improved.  Diet recommendation: Low sodium, heart healthy.  Wound care: None.  Code status: Full.   History of Present Illness:   Troy May is a 70 y.o. male with medical history significant for no chronic medical problems who presents for evaluation of fever, chills, shortness of breath and cough for the last 2 days.  He reports that this afternoon he also developed chest pain that he described as a sharp substernal pain with periods of chest pressure in between the sharp pain.  Chest pain was worsened with coughing.  He reports having increased shortness of breath with exertion which is not normal for him.  States he has not been to a doctor in the past few decades and has no known chronic medical problems.  He does not take any chronic medications.  Reports the chest pain did not radiate.  He has had shortness of breath and nausea.  States he has had a fever to 101 degrees for the last 2  days that improved with Motrin yesterday.  States he has a dry nonproductive cough.  He has not had any prolonged immobilization or prolonged travel.  He has never had DVT or PE.  He has never had cardiac problems or cardiac work-up in the past He has a history of smoking but states he quit a month ago.  States he would smoke 1-2 mini cigars a day.  Denies alcohol or illicit drug use.   Hospital Course by Problem:   Influenza A infection -treated   Newly diagnosed acute biventricular heart failure/ischemic cardiomyopathy post left and right heart cath 07/27/2021 Heart failure team consulted by cardiology Aspirin 81 mg Atorvastatin 80 - HF fund Jardiance 10 mg daily - HF fund Xarelto 20 mg daily - HF fund Entresto 49/51 mg BID - 30 day free card Torsemide 20 mg daily starting 12/01 - HF fund Potassium 20 mEq daily - HF fund   Newly diagnosed coronary artery disease with mid distal LAD 100% stenosed, ostial proximal RCA 100% stenosed -defer to cards   Newly diagnosed bilateral renal masses, incidentally found, 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 Dr. [place] Crane discussed with urology Dr. Jeffie Pollock, will arrange outpatient follow-up- referral placed   Newly diagnosed acute combined diastolic and systolic CHF Presented with elevated BNP greater than 1000 Cardiomegaly seen on chest x-ray Trace lower extremity edema bilaterally. 2D echo done on 07/25/2021 showed a large LV apical thrombus measuring 2.5 x 1.8 cm.  LVEF 25 to 30%, regional wall motion abnormalities, grade 2 diastolic dysfunction. -see above re: CHF team   Elevated troponin, multifactorial, secondary to apical thrombus, severe ischemic cardiomyopathy -xarelto   Leukopenia/thrombocytopenia Suspect in the setting of acute illness/viral illness   Hyperlipidemia Started on high intensity statin Lipitor 80 mg nightly.   Hypertension Management per cardiology  Hypokalemia -replete   AKI -stable:  outpatient follow up   Ascending thoracic aorta aneurysm, seen on chest CT. Measuring 4.3 cm, former smoker Will need annual scans for surveillance.   Resolved hypovolemic hyponatremia   Cirrhosis, incidentally found on MRI abdomen with contrast Endorses light alcohol use maybe once a week. AST mildly elevated The rest of LFTs are normal Avoid hepatotoxic agents Monitor, would benefit from GI follow-up outpatient.   Former tobacco use disorder Quit tobacco use about a month ago.    Medical Consultants:    CHF team  Discharge Exam:   Vitals:   07/28/21 2201 07/29/21 0414  BP: (!) 125/95 120/89  Pulse: 65 75  Resp:  17  Temp:  98.9 F (37.2 C)  SpO2:  92%   Vitals:   07/28/21 1209 07/28/21 2036 07/28/21 2201 07/29/21 0414  BP: (!) 158/97 (!) 135/105 (!) 125/95 120/89  Pulse: 66 72 65 75  Resp: 18 17  17   Temp: 97.9 F (36.6 C) 98.4 F (36.9 C)  98.9 F (37.2 C)  TempSrc: Oral Oral  Oral  SpO2: 96% 96%  92%  Weight:    78.4 kg  Height:        General exam: Appears calm and comfortable.   The results of significant diagnostics from this hospitalization (including imaging, microbiology, ancillary and laboratory) are listed below for reference.     Procedures and Diagnostic Studies:   DG Chest 2 View  Result Date: 07/24/2021 CLINICAL DATA:  Cough, congestion, sore throat. EXAM: CHEST - 2 VIEW COMPARISON:  06/28/2008. FINDINGS: The heart is enlarged. The mediastinal structures are within normal limits. No consolidation, effusion, or pneumothorax is seen. No acute osseous abnormality. IMPRESSION: Cardiomegaly with no acute process. Electronically Signed   By: Brett Fairy M.D.   On: 07/24/2021 21:14   CT Angio Chest Pulmonary Embolism (PE) W or WO Contrast  Result Date: 07/25/2021 CLINICAL DATA:  Fever, fatigue.  Evaluate for pulmonary embolism. EXAM: CT ANGIOGRAPHY CHEST WITH CONTRAST TECHNIQUE: Multidetector CT imaging of the chest was performed using the  standard protocol during bolus administration of intravenous contrast. Multiplanar CT image reconstructions and MIPs were obtained to evaluate the vascular anatomy. CONTRAST:  136mL OMNIPAQUE IOHEXOL 350 MG/ML SOLN COMPARISON:  None. FINDINGS: Vascular Findings: There is adequate opacification of the pulmonary arterial system with the main pulmonary artery measuring 419 Hounsfield units. There are no discrete filling defects within the pulmonary arterial tree to suggest pulmonary embolism. Borderline enlarged caliber the main pulmonary artery measuring 33 mm in diameter. Marked cardiomegaly, in particular, there is enlargement of the left ventricle. Coronary artery calcifications. No pericardial effusion though a small amount of fluid is seen within the pericardial recess. Mild fusiform aneurysmal dilatation of the ascending thoracic aorta measuring 43 mm in diameter (image 55, series 7). No evidence of an intramural hematoma. Review of the MIP images confirms the above findings. ---------------------------------------------------------------------------------- Nonvascular Findings: Mediastinum/Lymph Nodes: Evaluation for mediastinal and hilar adenopathy is degraded secondary to tailoring the examination for evaluation of the pulmonary arteries. Scattered mediastinal lymph nodes are mildly prominent though not enlarged by size criteria with index prevascular lymph node  measuring 0.9 cm in greatest short axis diameter (image 43, series 8). No definitive bulky mediastinal, hilar or axillary lymphadenopathy. Lungs/Pleura: Circumferential bronchial wall thickening, most conspicuously involving the bilateral lower lobes. Scattered ill-defined somewhat tree-in-bud ground-glass opacities are seen with the right upper (images 50 and 55, series 8) and lower lobes (image 77 and 86, series 8). No associated air bronchograms. Minimal bibasilar ground-glass atelectasis. No pleural effusion or pneumothorax. Apical predominant  centrilobular and paraseptal emphysematous change. There is a punctate (3 mm) pulmonary nodule within right middle lobe (image 73, series 8). Upper abdomen: Limited early arterial phase evaluation of the upper abdomen demonstrates isoattenuating bilateral renal masses, the right measuring 5.5 x 4.6 cm (image 129, series 7, and the left measuring 4.8 x 4.1 cm (image 159, series 7). Mild nodularity of the hepatic contour as could be seen in the setting of cirrhotic change. Musculoskeletal: No acute or aggressive osseous abnormalities. Regional soft tissues appear normal. Normal appearance of the thyroid gland. IMPRESSION: 1. No evidence of pulmonary embolism. 2. Diffuse bronchial wall thickening with scattered ill-defined somewhat tree-in-bud ground-glass opacities within the right upper and lower lobes, nonspecific though could be seen in the setting of early atypical infection superimposed on bronchitis/airways disease. 3. Bilateral isoattenuating renal masses, the right measuring 5.5 cm and the left measuring 3.8 cm, incompletely evaluated on the present examination, though worrisome for renal cell carcinomas. Further evaluation with nonemergent renal protocol CT or MRI could be performed as indicated. 4.  Emphysema (ICD10-J43.9). 5. Punctate (3 mm) right middle lobe pulmonary nodule. Comparison with prior outside examinations is advised. Pending the results of the above recommended dedicated renal imaging, a non-contrast chest CT can be considered in 12 months given patient's emphysematous change. This recommendation follows the consensus statement: Guidelines for Management of Incidental Pulmonary Nodules Detected on CT Images: From the Fleischner Society 2017; Radiology 2017; 284:228-243. 6. 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 Recommend annual imaging followup by CTA or MRA. This recommendation follows 2010 ACCF/AHA/AATS/ACR/ASA/SCA/SCAI/SIR/STS/SVM  Guidelines for the Diagnosis and Management of Patients with Thoracic Aortic Disease. Circulation. 2010; 121: Z610-R604. Aortic aneurysm NOS (ICD10-I71.9) 7. Marked cardiomegaly with enlargement of the caliber the main pulmonary artery, nonspecific though could be seen in the setting pulmonary arterial hypertension. Further evaluation with cardiac echo could be performed as indicated. 8. Coronary artery calcifications. Aortic Atherosclerosis (ICD10-I70.0). 9. Mild nodularity hepatic contour as could be seen in the setting of cirrhotic change. Correlation with LFTs is advised. Electronically Signed   By: Sandi Mariscal M.D.   On: 07/25/2021 12:18   ECHOCARDIOGRAM COMPLETE  Result Date: 07/25/2021    ECHOCARDIOGRAM REPORT   Patient Name:   Troy May Date of Exam: 07/25/2021 Medical Rec #:  [id number]  Height:       70.0 in Accession #:    [id number] Weight:       200.0 lb Date of Birth:  [birth year]  BSA:          2.087 m Patient Age:    28 years   BP:           126/91 mmHg Patient Gender: M          HR:           76 bpm. Exam Location:  Inpatient Procedure: 2D Echo, Cardiac Doppler, Color Doppler and Intracardiac            Opacification Agent STAT ECHO Indications:    Acute ischemic heart disease,  chest pain, elevated troponin  History:        Patient has no prior history of Echocardiogram examinations.                 Risk Factors:Hypertension.  Sonographer:    Maudry Mayhew MHA, RDMS, RVT, RDCS Referring Phys: [id number] [place]  1. Left ventricular ejection fraction, by estimation, is 25 to 30%. The left ventricle has severely decreased function. The left ventricle demonstrates regional wall motion abnormalities (see scoring diagram/findings for description). The left ventricular internal cavity size was mildly dilated. There is mild left ventricular hypertrophy. Left ventricular diastolic parameters are consistent with Grade II diastolic dysfunction (pseudonormalization). Elevated left  atrial pressure.  2. Large LV apical thrombus measuring 2.5cm x 1.8cm  3. Right ventricular systolic function is moderately reduced. The right ventricular size is moderately enlarged. There is mildly elevated pulmonary artery systolic pressure. The estimated right ventricular systolic pressure is 95.6 mmHg.  4. Right atrial size was mildly dilated.  5. The mitral valve is normal in structure. Trivial mitral valve regurgitation.  6. The aortic valve is tricuspid. Aortic valve regurgitation is not visualized. No aortic stenosis is present.  7. The inferior vena cava is dilated in size with >50% respiratory variability, suggesting right atrial pressure of 8 mmHg. FINDINGS  Left Ventricle: Left ventricular ejection fraction, by estimation, is 25 to 30%. The left ventricle has severely decreased function. The left ventricle demonstrates regional wall motion abnormalities. Definity contrast agent was given IV to delineate the left ventricular endocardial borders. The left ventricular internal cavity size was mildly dilated. There is mild left ventricular hypertrophy. Left ventricular diastolic parameters are consistent with Grade II diastolic dysfunction (pseudonormalization). Elevated left atrial pressure.  LV Wall Scoring: The mid and distal lateral wall, entire apex, entire inferior wall, and posterior wall are akinetic. The anterior wall, antero-lateral wall, anterior septum, mid inferoseptal segment, and basal inferoseptal segment are normal. Right Ventricle: The right ventricular size is moderately enlarged. Right vetricular wall thickness was not well visualized. Right ventricular systolic function is moderately reduced. There is mildly elevated pulmonary artery systolic pressure. The tricuspid regurgitant velocity is 2.73 m/s, and with an assumed right atrial pressure of 8 mmHg, the estimated right ventricular systolic pressure is 21.3 mmHg. Left Atrium: Left atrial size was normal in size. Right Atrium: Right  atrial size was mildly dilated. Pericardium: There is no evidence of pericardial effusion. Mitral Valve: The mitral valve is normal in structure. Trivial mitral valve regurgitation. Tricuspid Valve: The tricuspid valve is normal in structure. Tricuspid valve regurgitation is trivial. Aortic Valve: The aortic valve is tricuspid. Aortic valve regurgitation is not visualized. No aortic stenosis is present. Aortic valve mean gradient measures 1.0 mmHg. Aortic valve peak gradient measures 3.0 mmHg. Aortic valve area, by VTI measures 2.18 cm. Pulmonic Valve: The pulmonic valve was not well visualized. Pulmonic valve regurgitation is not visualized. Aorta: The aortic root is normal in size and structure. Venous: The inferior vena cava is dilated in size with greater than 50% respiratory variability, suggesting right atrial pressure of 8 mmHg. IAS/Shunts: The interatrial septum was not well visualized.  LEFT VENTRICLE PLAX 2D LVIDd:         5.80 cm   Diastology LVIDs:         5.30 cm   LV e' medial:    4.53 cm/s LV PW:         1.10 cm   LV E/e' medial:  15.6 LV IVS:  1.20 cm   LV e' lateral:   5.22 cm/s LVOT diam:     1.70 cm   LV E/e' lateral: 13.5 LV SV:         28 LV SV Index:   13 LVOT Area:     2.27 cm  RIGHT VENTRICLE RV S prime:     7.01 cm/s TAPSE (M-mode): 1.1 cm LEFT ATRIUM           Index        RIGHT ATRIUM           Index LA diam:      4.30 cm 2.06 cm/m   RA Area:     22.10 cm LA Vol (A2C): 58.0 ml 27.79 ml/m  RA Volume:   75.50 ml  36.17 ml/m LA Vol (A4C): 63.5 ml 30.42 ml/m  AORTIC VALVE AV Area (Vmax):    1.70 cm AV Area (Vmean):   1.74 cm AV Area (VTI):     2.18 cm AV Vmax:           86.10 cm/s AV Vmean:          50.400 cm/s AV VTI:            0.128 m AV Peak Grad:      3.0 mmHg AV Mean Grad:      1.0 mmHg LVOT Vmax:         64.30 cm/s LVOT Vmean:        38.600 cm/s LVOT VTI:          0.123 m LVOT/AV VTI ratio: 0.96  AORTA Ao Root diam: 3.20 cm MITRAL VALVE               TRICUSPID VALVE MV  Area (PHT): 7.51 cm    TR Peak grad:   29.8 mmHg MV Decel Time: 101 msec    TR Vmax:        273.00 cm/s MR Peak grad: 44.5 mmHg MR Vmax:      333.50 cm/s  SHUNTS MV E velocity: 70.70 cm/s  Systemic VTI:  0.12 m MV A velocity: 46.30 cm/s  Systemic Diam: 1.70 cm MV E/A ratio:  1.53 Oswaldo Milian MD Electronically signed by Oswaldo Milian MD Signature Date/Time: 07/25/2021/12:12:47 PM    Final      Labs:   Basic Metabolic Panel: Recent Labs  Lab 07/25/21 0248 07/26/21 0238 07/27/21 0257 07/27/21 1041 07/27/21 1046 07/27/21 1050 07/28/21 0557 07/29/21 0500  NA 132* 134* 135 138 138 138 138 136  K 3.9 3.7 4.2 3.9 4.2 4.0 3.8 3.4*  CL 101 101 104  --   --   --  103 98  CO2 23 27 23   --   --   --  28 27  GLUCOSE 93 140* 91  --   --   --  98 109*  BUN 18 16 17   --   --   --  20 19  CREATININE 1.28* 1.33* 1.30*  --   --   --  1.39* 1.58*  CALCIUM 7.8* 7.6* 7.9*  --   --   --  8.1* 8.3*  MG  --   --  2.0  --   --   --  2.1 2.0  PHOS  --   --  4.0  --   --   --   --   --    GFR Estimated Creatinine Clearance: 44.9 mL/min (A) (by C-G formula based on SCr of 1.58  mg/dL (H)). Liver Function Tests: Recent Labs  Lab 07/27/21 0257  AST 43*  ALT 33  ALKPHOS 56  BILITOT 0.8  PROT 5.2*  ALBUMIN 2.6*   No results for input(s): LIPASE, AMYLASE in the last 168 hours. No results for input(s): AMMONIA in the last 168 hours. Coagulation profile No results for input(s): INR, PROTIME in the last 168 hours.  CBC: Recent Labs  Lab 07/24/21 2021 07/26/21 0238 07/27/21 0257 07/27/21 1041 07/27/21 1046 07/27/21 1050 07/28/21 0557 07/29/21 0500  WBC 5.4 3.8* 4.5  --   --   --  3.7* 3.6*  NEUTROABS 3.3  --   --   --   --   --   --   --   HGB 15.6 14.0 14.3 13.6 14.3 13.9 14.1 15.6  HCT 47.2 40.8 41.7 40.0 42.0 41.0 42.4 46.3  MCV 96.5 94.0 93.3  --   --   --  94.6 93.2  PLT 111* 103* 102*  --   --   --  101* 117*   Cardiac Enzymes: No results for input(s): CKTOTAL, CKMB,  CKMBINDEX, TROPONINI in the last 168 hours. BNP: Invalid input(s): POCBNP CBG: No results for input(s): GLUCAP in the last 168 hours. D-Dimer No results for input(s): DDIMER in the last 72 hours. Hgb A1c No results for input(s): HGBA1C in the last 72 hours. Lipid Profile No results for input(s): CHOL, HDL, LDLCALC, TRIG, CHOLHDL, LDLDIRECT in the last 72 hours. Thyroid function studies No results for input(s): TSH, T4TOTAL, T3FREE, THYROIDAB in the last 72 hours.  Invalid input(s): FREET3 Anemia work up No results for input(s): VITAMINB12, FOLATE, FERRITIN, TIBC, IRON, RETICCTPCT in the last 72 hours. Microbiology Recent Results (from the past 240 hour(s))  Resp Panel by RT-PCR (Flu A&B, Covid) Nasopharyngeal Swab     Status: Abnormal   Collection Time: 07/24/21  7:56 PM   Specimen: Nasopharyngeal Swab; Nasopharyngeal(NP) swabs in vial transport medium  Result Value Ref Range Status   SARS Coronavirus 2 by RT PCR NEGATIVE NEGATIVE Final    Comment: (NOTE) SARS-CoV-2 target nucleic acids are NOT DETECTED.  The SARS-CoV-2 RNA is generally detectable in upper respiratory specimens during the acute phase of infection. The lowest concentration of SARS-CoV-2 viral copies this assay can detect is 138 copies/mL. A negative result does not preclude SARS-Cov-2 infection and should not be used as the sole basis for treatment or other patient management decisions. A negative result may occur with  improper specimen collection/handling, submission of specimen other than nasopharyngeal swab, presence of viral mutation(s) within the areas targeted by this assay, and inadequate number of viral copies(<138 copies/mL). A negative result must be combined with clinical observations, patient history, and epidemiological information. The expected result is Negative.  Fact Sheet for Patients:  EntrepreneurPulse.com.au  Fact Sheet for Healthcare Providers:   IncredibleEmployment.be  This test is no t yet approved or cleared by the [place] FDA and  has been authorized for detection and/or diagnosis of SARS-CoV-2 by FDA under an Emergency Use Authorization (EUA). This EUA will remain  in effect (meaning this test can be used) for the duration of the COVID-19 declaration under Section 564(b)(1) of the Act, 21 U.S.C.section 360bbb-3(b)(1), unless the authorization is terminated  or revoked sooner.       Influenza A by PCR POSITIVE (A) NEGATIVE Final   Influenza B by PCR NEGATIVE NEGATIVE Final    Comment: (NOTE) The Xpert Xpress SARS-CoV-2/FLU/RSV plus assay is intended as an aid in the  diagnosis of influenza from Nasopharyngeal swab specimens and should not be used as a sole basis for treatment. Nasal washings and aspirates are unacceptable for Xpert Xpress SARS-CoV-2/FLU/RSV testing.  Fact Sheet for Patients: EntrepreneurPulse.com.au  Fact Sheet for Healthcare Providers: IncredibleEmployment.be  This test is not yet approved or cleared by the [place] FDA and has been authorized for detection and/or diagnosis of SARS-CoV-2 by FDA under an Emergency Use Authorization (EUA). This EUA will remain in effect (meaning this test can be used) for the duration of the COVID-19 declaration under Section 564(b)(1) of the Act, 21 U.S.C. section 360bbb-3(b)(1), unless the authorization is terminated or revoked.  Performed at [facility], [place] [address] [place], [place] [address]      Discharge Instructions:   Discharge Instructions     (HEART FAILURE PATIENTS) Call MD:  Anytime you have any of the following symptoms: 1) 3 pound weight gain in 24 hours or 5 pounds in 1 week 2) shortness of breath, with or without a dry hacking cough 3) swelling in the hands, feet or stomach 4) if you have to sleep on extra pillows at night in order to breathe.   Complete by: As  directed    Diet - low sodium heart healthy   Complete by: As directed    Heart Failure patients record your daily weight using the same scale at the same time of day   Complete by: As directed    Increase activity slowly   Complete by: As directed       Allergies as of 07/29/2021   No Known Allergies      Medication List     STOP taking these medications    ibuprofen 200 MG tablet Commonly known as: ADVIL       TAKE these medications    aspirin 81 MG EC tablet Take 1 tablet (81 mg total) by mouth daily. Swallow whole. Start taking on: July 30, 2021   atorvastatin 80 MG tablet Commonly known as: LIPITOR Take 1 tablet (80 mg total) by mouth daily. Start taking on: July 30, 2021   cetirizine 10 MG tablet Commonly known as: ZYRTEC Take 10 mg by mouth daily as needed for allergies.   empagliflozin 10 MG Tabs tablet Commonly known as: JARDIANCE Take 1 tablet (10 mg total) by mouth daily.   OVER THE COUNTER MEDICATION Take 1 tablet by mouth daily. gummy   potassium chloride SA 20 MEQ tablet Commonly known as: KLOR-CON M Take 1 tablet (20 mEq total) by mouth daily.   rivaroxaban 20 MG Tabs tablet Commonly known as: XARELTO Take 1 tablet (20 mg total) by mouth daily with supper.   sacubitril-valsartan 49-51 MG Commonly known as: ENTRESTO Take 1 tablet by mouth 2 (two) times daily.   torsemide 20 MG tablet Commonly known as: DEMADEX Take 1 tablet (20 mg total) by mouth daily. Start taking on: July 30, 2021        Follow-up Information     Unk Pinto, MD Follow up.   Specialty: Internal Medicine Why: office will call to see if they can accept as new patient Contact information: [address] [place] [address] [phone]         [facility] Follow up on 08/05/2021.   Specialty: Cardiology Why: [facility] at 9:30 am Entrance C, Garage Code  5555 Contact information: [address] [id number] [place] [phone]  Time coordinating discharge: 35 min  Signed:  Geradine Girt DO  [facility] 07/29/2021, 10:45 AM

## 2021-07-29 NOTE — Progress Notes (Signed)
Procedure was explained to the patient and his wife. Patient was instructed to hold his breath during PICC line removal. HOB was lowered to 0 degrees and patient was placed in supine position. Prior to PICC line removal from LUA, the site was assessed and found to be WNL, Dressing was clean,dry, and intact. PICC line catheter was removed and tip was intact. Length measured 45 cm. Pressure was held to insertion site with Vaseline gauze, 2x2 gauze for 6 minutes then a Tegaderm occlusive dressing was applied over gauze. Patient and his wife were instructed to leave dressing in place for 24 hours and to refrain from soaking or submerging the site in water or liquid for 7 days or until a scabbed has well formed. Patient was instructed to remain in bed in supine position for 30 minutes following line removal. Patient and his wife verbalized understanding of instructions. Patient offered no complaints at end of procedure. Patient and his wife were instructed to monitor PICC Line insertion site to [facility] for bleeding, swelling, or s/s infection  and to notify nurse of any concerns.

## 2021-07-31 ENCOUNTER — Telehealth: Payer: Self-pay | Admitting: *Deleted

## 2021-07-31 ENCOUNTER — Telehealth (HOSPITAL_COMMUNITY): Payer: Self-pay | Admitting: *Deleted

## 2021-07-31 NOTE — Telephone Encounter (Signed)
Pts wife called stating pts bp was 79/57 at 930am before morning meds. I had her recheck bp  around 11 and bp was 101/71. Pt is asymptomatic. Per Janett Billow [place],FNP hold entresto today and then  decreased dose to 24/26mg  bid. Hold entresto if systolic bp is less than 164. keep office visit 12/7. Wife aware and agreeable with plan.

## 2021-08-04 NOTE — Progress Notes (Signed)
ADVANCED HF CLINIC CONSULT NOTE  Primary Care: HF Cardiologist:  Dr. Haroldine Laws  HPI: Troy May is a 70 y.o. male w/ recent diagnosis of HTN, HLD, CAD, and BiV CHF/iCM.  Admitted 07/24/21 w/ CP, Influenza A+. Hypertensive on arrival requiring labetalol gtt. EKG w/ nonspecific ST abnormalities. CT of chest negative for PE but + for cardiomegaly, coronary artery calcifications, mild aneurysmal dilatation of the ascending thoracic aorta 43 mm in; bilateral renal masses suspicious for RCC incidentally found. Cardiology consulted. Echo showed severe biventricular dysfunction. LVEF severely reduced, 25-30% w/ large LV apical thrombus measuring 2.5cm x 1.8 cm, GIIDD, RV moderately reduced. Underwent R/LHC showing severe 2V CAD w/ occluded LAD and RCA, severe iCM, EF 25%, and elevated filling pressures w/ low output, CI 1.7. No good options for revascularization and continued on medical therapy. cMRI showed LVEF 21%, LAD and RCA territories with scar and do not appear viable, large LV thrombus. Started on Xarelto, diuresed with IV Lasix, transitioned to PO torsemide and GDMT titrated; no beta blocker w/ low output. Hospitalization c/b hypertension requiring labetalol, AKI likely cardiorenal & overdiuresis, and + influenza A.  Discharged home, weight 172 lbs.  Today he returns for post hospitalization HF follow up with his wife. No SOB with activity. Overall feeling fine. Denies abnormal bleeding, palpitations, CP, dizziness, edema, or PND/Orthopnea. Appetite ok. No fever or chills. Weight at home 174 pounds. Taking all medications. BP at home ~90-100/60-70s. Had follow up with Urology last week, no aggressive treatment plan for renal mass yet, plan to follow up in 6 months.     Cardiac Studies: - Echo (11/22): EF 25-30%, RV moderately reduced, GIIDD - cMRI (11/22): EF 21% and minimal viability in RCA and LAD territories  - [facility] 07/27/21:   Ost RCA to Prox RCA lesion is 100% stenosed.   Prox Cx to Mid  Cx lesion is 30% stenosed.   1st Mrg lesion is 20% stenosed.   Prox LAD lesion is 60% stenosed.   Mid LAD lesion is 90% stenosed.   Mid LAD to Dist LAD lesion is 100% stenosed.   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% Elevated filling pressures with low output  Review of Systems: [y] = yes, [ ]  = no   General: Weight gain [ ] ; Weight loss [ ] ; Anorexia [ ] ; Fatigue [ ] ; Fever [ ] ; Chills [ ] ; Weakness [ ]   Cardiac: Chest pain/pressure [ ] ; Resting SOB [ ] ; Exertional SOB [ ] ; Orthopnea [ ] ; Pedal Edema [ ] ; Palpitations [ ] ; Syncope [ ] ; Presyncope [ ] ; Paroxysmal nocturnal dyspnea[ ]   Pulmonary: Cough [ ] ; Wheezing[ ] ; Hemoptysis[ ] ; Sputum [ ] ; Snoring [ ]   GI: Vomiting[ ] ; Dysphagia[ ] ; Melena[ ] ; Hematochezia [ ] ; Heartburn[ ] ; Abdominal pain [ ] ; Constipation [ ] ; Diarrhea [ ] ; BRBPR [ ]   GU: Hematuria[ ] ; Dysuria [ ] ; Nocturia[ ]   Vascular: Pain in legs with walking [ ] ; Pain in feet with lying flat [ ] ; Non-healing sores [ ] ; Stroke [ ] ; TIA [ ] ; Slurred speech [ ] ;  Neuro: Headaches[ ] ; Vertigo[ ] ; Seizures[ ] ; Paresthesias[ ] ;Blurred vision [ ] ; Diplopia [ ] ; Vision  changes [ ]   Ortho/Skin: Arthritis [ ] ; Joint pain [ ] ; Muscle pain [ ] ; Joint swelling [ ] ; Back Pain [ ] ; Rash [ ]   Psych: Depression[ ] ; Anxiety[ ]   Heme: Bleeding problems [ ] ; Clotting disorders [ ] ; Anemia [ ]   Endocrine: Diabetes [ ] ; Thyroid dysfunction[ ]   Past Medical History:  Diagnosis Date   Ascending aortic aneurysm    Bilateral renal masses    CAD (coronary artery disease)    HLD (hyperlipidemia)    Hypertension    LV (left ventricular) mural thrombus    Systolic heart failure (HCC)    Current Outpatient Medications  Medication Sig Dispense Refill   aspirin 81  MG EC tablet Take 1 tablet (81 mg total) by mouth daily. Swallow whole. 30 tablet 11   atorvastatin (LIPITOR) 80 MG tablet Take 1 tablet (80 mg total) by mouth daily. 30 tablet 1   cetirizine (ZYRTEC) 10 MG tablet Take 10 mg by mouth daily as needed for allergies.     empagliflozin (JARDIANCE) 10 MG TABS tablet Take 1 tablet (10 mg total) by mouth daily. 30 tablet 1   OVER THE COUNTER MEDICATION Take 1 tablet by mouth daily. gummy     potassium chloride SA (KLOR-CON M) 20 MEQ tablet Take 1 tablet (20 mEq total) by mouth daily. 30 tablet 1   rivaroxaban (XARELTO) 20 MG TABS tablet Take 1 tablet (20 mg total) by mouth daily with supper. 30 tablet 1   sacubitril-valsartan (ENTRESTO) 49-51 MG Take 1 tablet by mouth 2 (two) times daily. (Patient taking differently: Take 1 tablet by mouth 2 (two) times daily. Patient only takes 1 tablet daily if his pressure is less than 500 of his systolic blood pressure reading.) 60 tablet 1   torsemide (DEMADEX) 20 MG tablet Take 1 tablet (20 mg total) by mouth daily. 30 tablet 1   No current facility-administered medications for this encounter.    No Known Allergies    Social History   Socioeconomic History   Marital status: Married    Spouse name: Not on file   Number of children: Not on file   Years of education: Not on file   Highest education level: Not on file  Occupational History   Not on file  Tobacco Use   Smoking status: Some Days    Types: Cigarettes   Smokeless tobacco: Not on file  Substance and Sexual Activity   Alcohol use: Not on file   Drug use: Not on file   Sexual activity: Not on file  Other Topics Concern   Not on file  Social History Narrative   Not on file   Social Determinants of Health   Financial Resource Strain: Not on file  Food Insecurity: Not on file  Transportation Needs: Not on file  Physical Activity: Not on file  Stress: Not on file  Social Connections: Not on file  Intimate Partner Violence: Not on file    Family History  Problem Relation Age of Onset   Hypertension Brother    Heart failure Neg Hx    BP 98/70   Pulse 67   Wt 81.3 kg   SpO2 97%   BMI 25.71 kg/m   Wt Readings from Last 3 Encounters:  08/05/21 81.3 kg  07/29/21 78.4 kg   PHYSICAL EXAM: General:  NAD. No resp difficulty, thin HEENT: Normal Neck: Supple. No JVD. Carotids 2+ bilat; no bruits. No lymphadenopathy or thryomegaly appreciated. Cor: PMI nondisplaced. Regular rate & rhythm.  No rubs, gallops or murmurs. Lungs: Clear Abdomen: Soft, nontender, nondistended. No hepatosplenomegaly. No bruits or masses. Good bowel sounds. Extremities: No cyanosis, clubbing, rash, edema Neuro: Alert & oriented x 3, cranial nerves grossly intact. Moves all 4 extremities w/o difficulty. Affect pleasant.  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 - With borderline BP, decrease Entresto to 24/26 mg bid. - Start spiro 12.5 mg daily. - Continue torsemide 20 mg daily. - Continue Jardiance 10 mg daily.  - No ? blocker (Coreg) yet, add back as able. - BMET today, repeat in 1 week.  2. CAD - Diagnostic LHC w/ severe 2V CAD, mid-distal LAD 100% stenosed, ost-prox RCA 100% stenosed  - cMRI (11/220: EF 21% and larger inferior and apical scars with minimal viability in the RCA/LAD territories. Large LV clot.  - No options for revascularization, continue medical therapy. - Denies CP - ASA 81 mg daily + atorvastatin 80 nighlty (LDL 123) - Holding ? blocker due to low output HF    3. LV Thrombus  - 2/2 severe LV dysfunction  - Continue Xarelto 20 mg daily, CrCl 45 ml/min today. Has been > 50 otherwise. Need to consider cutting back to 15 mg daily if consistently  < 50 ml/min - CBC today.   4. Bilateral Renal Masses, suspicion for RCC - Incidental finding on chest CT - Not amenable to coiling per IR - Saw Urology last week, will request records.    5. Hypertension  - BP now improved with GDMT - Med changes as above. - off ? blocker w/ low output - Continue to check BP at home, notify clinic if sBP<90   6. HLD w/ LDL Goal < 70 - LDL 123 mg/dL. LFTs ok.  - Continue statin - Check FLP (1/23)   7. Thrombocytopenia  - Recent plts 117 - ? Acute vs chronic. Baseline unknown.  - CBC today.   8. Ascending Thoracic Aortic Aneurysm - 43 mm in diameter on chest CT - Tricuspid AoV on echo. No AI/AS - BP control per above + statin.  - Will need annual scans for surveillance    9. H/o Hypokalemia - BMET & mag today   He does not have Rx coverage, all cardiac meds thru HF fund.  Follow up w/ PharmD in 3 weeks (will need lipids/bmet) for med titration, 6 weeks with APP and 12 weeks with Dr. Haroldine Laws + echo.  Allena Katz, FNP-BC 08/05/21

## 2021-08-05 ENCOUNTER — Encounter (HOSPITAL_COMMUNITY): Payer: Self-pay

## 2021-08-05 ENCOUNTER — Other Ambulatory Visit: Payer: Self-pay

## 2021-08-05 ENCOUNTER — Other Ambulatory Visit (HOSPITAL_COMMUNITY): Payer: Self-pay

## 2021-08-05 ENCOUNTER — Ambulatory Visit (HOSPITAL_COMMUNITY)
Admit: 2021-08-05 | Discharge: 2021-08-05 | Disposition: A | Discharge status: Home / Self Care | Payer: Medicare Other | Attending: Family Medicine | Admitting: Family Medicine

## 2021-08-05 VITALS — BP 98/70 | HR 67 | Wt 179.2 lb

## 2021-08-05 DIAGNOSIS — Z79899 Other long term (current) drug therapy: Secondary | ICD-10-CM | POA: Insufficient documentation

## 2021-08-05 DIAGNOSIS — L905 Scar conditions and fibrosis of skin: Secondary | ICD-10-CM | POA: Diagnosis not present

## 2021-08-05 DIAGNOSIS — I255 Ischemic cardiomyopathy: Secondary | ICD-10-CM | POA: Diagnosis not present

## 2021-08-05 DIAGNOSIS — I5082 Biventricular heart failure: Secondary | ICD-10-CM | POA: Diagnosis not present

## 2021-08-05 DIAGNOSIS — Z86718 Personal history of other venous thrombosis and embolism: Secondary | ICD-10-CM | POA: Insufficient documentation

## 2021-08-05 DIAGNOSIS — I10 Essential (primary) hypertension: Secondary | ICD-10-CM

## 2021-08-05 DIAGNOSIS — I5022 Chronic systolic (congestive) heart failure: Secondary | ICD-10-CM | POA: Diagnosis not present

## 2021-08-05 DIAGNOSIS — Z7982 Long term (current) use of aspirin: Secondary | ICD-10-CM | POA: Diagnosis not present

## 2021-08-05 DIAGNOSIS — Z7901 Long term (current) use of anticoagulants: Secondary | ICD-10-CM | POA: Diagnosis not present

## 2021-08-05 DIAGNOSIS — I513 Intracardiac thrombosis, not elsewhere classified: Secondary | ICD-10-CM

## 2021-08-05 DIAGNOSIS — Z7984 Long term (current) use of oral hypoglycemic drugs: Secondary | ICD-10-CM | POA: Diagnosis not present

## 2021-08-05 DIAGNOSIS — N2889 Other specified disorders of kidney and ureter: Secondary | ICD-10-CM

## 2021-08-05 DIAGNOSIS — I2510 Atherosclerotic heart disease of native coronary artery without angina pectoris: Secondary | ICD-10-CM

## 2021-08-05 DIAGNOSIS — Z8639 Personal history of other endocrine, nutritional and metabolic disease: Secondary | ICD-10-CM

## 2021-08-05 DIAGNOSIS — I7121 Aneurysm of the ascending aorta, without rupture: Secondary | ICD-10-CM | POA: Diagnosis not present

## 2021-08-05 DIAGNOSIS — I110 Hypertensive heart disease with heart failure: Secondary | ICD-10-CM | POA: Insufficient documentation

## 2021-08-05 DIAGNOSIS — D696 Thrombocytopenia, unspecified: Secondary | ICD-10-CM | POA: Diagnosis not present

## 2021-08-05 DIAGNOSIS — E785 Hyperlipidemia, unspecified: Secondary | ICD-10-CM

## 2021-08-05 LAB — BASIC METABOLIC PANEL
Anion gap: 8 (ref 5–15)
BUN: 30 mg/dL — ABNORMAL HIGH (ref 8–23)
CO2: 29 mmol/L (ref 22–32)
Calcium: 9 mg/dL (ref 8.9–10.3)
Chloride: 101 mmol/L (ref 98–111)
Creatinine, Ser: 1.9 mg/dL — ABNORMAL HIGH (ref 0.61–1.24)
GFR, Estimated: 37 mL/min — ABNORMAL LOW (ref >60)
Glucose, Bld: 109 mg/dL — ABNORMAL HIGH (ref 70–99)
Potassium: 5.1 mmol/L (ref 3.5–5.1)
Sodium: 138 mmol/L (ref 135–145)

## 2021-08-05 LAB — CBC
HCT: 51.6 % (ref 39.0–52.0)
Hemoglobin: 16.7 g/dL (ref 13.0–17.0)
MCH: 31 pg (ref 26.0–34.0)
MCHC: 32.4 g/dL (ref 30.0–36.0)
MCV: 95.7 fL (ref 80.0–100.0)
Platelets: 286 10*3/uL (ref 150–400)
RBC: 5.39 MIL/uL (ref 4.22–5.81)
RDW: 14.2 % (ref 11.5–15.5)
WBC: 6.8 10*3/uL (ref 4.0–10.5)
nRBC: 0 % (ref 0.0–0.2)

## 2021-08-05 LAB — MAGNESIUM: Magnesium: 2.8 mg/dL — ABNORMAL HIGH (ref 1.7–2.4)

## 2021-08-05 MED ORDER — SPIRONOLACTONE 25 MG PO TABS
12.5000 mg | ORAL_TABLET | Freq: Every day | ORAL | 3 refills | Status: DC
  Filled 2021-08-05: qty 15, 30d supply, fill #0

## 2021-08-05 MED ORDER — POTASSIUM CHLORIDE CRYS ER 20 MEQ PO TBCR
20.0000 meq | EXTENDED_RELEASE_TABLET | Freq: Every day | ORAL | 3 refills | Status: DC
Start: 2021-08-05 — End: 2021-08-06
  Filled 2021-08-05: qty 30, 30d supply, fill #0

## 2021-08-05 MED ORDER — RIVAROXABAN 20 MG PO TABS
20.0000 mg | ORAL_TABLET | Freq: Every day | ORAL | 4 refills | Status: DC
  Filled 2021-08-05: qty 30, 30d supply, fill #0

## 2021-08-05 MED ORDER — TORSEMIDE 20 MG PO TABS
20.0000 mg | ORAL_TABLET | Freq: Every day | ORAL | 3 refills | Status: DC
  Filled 2021-08-05: qty 30, 30d supply, fill #0

## 2021-08-05 MED ORDER — ENTRESTO 24-26 MG PO TABS: 1.0000 | ORAL_TABLET | Freq: Two times a day (BID) | ORAL | 4 refills | Status: DC

## 2021-08-05 MED ORDER — EMPAGLIFLOZIN 10 MG PO TABS
10.0000 mg | ORAL_TABLET | Freq: Every day | ORAL | 4 refills | Status: DC
  Filled 2021-08-05: qty 30, 30d supply, fill #0

## 2021-08-05 MED ORDER — ATORVASTATIN CALCIUM 80 MG PO TABS
80.0000 mg | ORAL_TABLET | Freq: Every day | ORAL | 4 refills | Status: DC
  Filled 2021-08-05: qty 30, 30d supply, fill #0

## 2021-08-05 NOTE — Patient Instructions (Addendum)
Thank You for coming in today  EKG was done  Labs were done if any labs are abnormal the clinic will call you  DECREASE Entresto to 24/26 mg 1 tablet, two times daily  START Spirolactone 12.5 mg 1/2 tablet daily   PLEASE notify the clinic if your systolic (top) blood pressure if less 100  Your physician recommends that you schedule a follow-up appointment in: 3 weeks with pharmacy 6 weeks and then 12 weeks with echocardiogram and Dr. Haroldine Laws  At the [facility], you and your health needs are our priority. As part of our continuing mission to provide you with exceptional heart care, we have created designated Provider Care Teams. These Care Teams include your primary Cardiologist (physician) and Advanced Practice Providers (APPs- Physician Assistants and Nurse Practitioners) who all work together to provide you with the care you need, when you need it.   You may see any of the following providers on your designated Care Team at your next follow up: Dr Glori Bickers Dr Haynes Kerns, NP Lyda Jester, [place] [facility] [place], [place] Audry Riles, PharmD   Please be sure to bring in all your medications bottles to every appointment.    If you have any questions or concerns before your next appointment please send [place] a message through [facility] or call our office at [phone].    TO LEAVE A MESSAGE FOR THE NURSE SELECT OPTION 2, PLEASE LEAVE A MESSAGE INCLUDING: YOUR NAME DATE OF BIRTH CALL BACK NUMBER REASON FOR CALL**this is important as we prioritize the call backs  YOU WILL RECEIVE A CALL BACK THE SAME DAY AS LONG AS YOU CALL BEFORE 4:00 PM

## 2021-08-06 ENCOUNTER — Telehealth (HOSPITAL_COMMUNITY): Payer: Self-pay | Admitting: Cardiology

## 2021-08-06 NOTE — Telephone Encounter (Signed)
-----   Message from Rafael Bihari, [place] sent at 08/05/2021  1:21 PM EST ----- Kidney function elevated. Do NOT start spiro today. Stop torsemide and KCL suppl. Continue to decrease Entresto to 24/26 bid.   Continue to check weight daily. Notify clinic if swelling, SOB, weight gain w/ these changes.  Repeat BMET in 1 week.

## 2021-08-06 NOTE — Telephone Encounter (Signed)
Patient called.  Patient aware.  

## 2021-08-10 NOTE — Progress Notes (Signed)
Primary Care: HF Cardiologist:  Dr. Haroldine Laws  HPI:  Troy May is a 70 y.o. male w/ recent diagnosis of HTN, HLD, CAD, and BiV CHF/iCM.   Admitted 07/24/21 w/ CP, Influenza A+. Hypertensive on arrival requiring labetalol gtt. EKG w/ nonspecific ST abnormalities. CT of chest negative for PE but + for cardiomegaly, coronary artery calcifications, mild aneurysmal dilatation of the ascending thoracic aorta 43 mm in; bilateral renal masses suspicious for RCC incidentally found. Cardiology consulted. Echo showed severe biventricular dysfunction. LVEF severely reduced, 25-30% w/ large LV apical thrombus measuring 2.5cm x 1.8 cm, GIIDD, RV moderately reduced. Underwent R/LHC showing severe 2V CAD w/ occluded LAD and RCA, severe iCM, EF 25%, and elevated filling pressures w/ low output, CI 1.7. No good options for revascularization and continued on medical therapy. cMRI showed LVEF 21%, LAD and RCA territories with scar and do not appear viable, large LV thrombus. Started on Xarelto, diuresed with IV Lasix, transitioned to PO torsemide and GDMT titrated; no beta blocker w/ low output. Hospitalization c/b hypertension requiring labetalol, AKI likely cardiorenal & overdiuresis, and + influenza A.  Discharged home, weight 172 lbs.   Recently presented to [facility] for post hospitalization HF follow up with his wife. No SOB with activity. Overall was feeling fine. Denied abnormal bleeding, palpitations, CP, dizziness, edema, or PND/Orthopnea. Appetite was ok. No fever or chills. Weight at home was 174 pounds. Reported taking all medications. BP at home ~90-100/60-70s. Had follow up with Urology last week, no aggressive treatment plan for renal mass yet, plan to follow up in 6 months.   Today he returns to HF clinic for pharmacist medication titration. At last visit with APP, Entresto was decreased to 24/26 mg BID given borderline pressures. Spironolactone 12.5 mg daily was initiated. However, labs returned showing  elevated Scr/K. He was instructed not to start the spironolactone, stop torsemide and KCL supplement and to continue to decrease Entresto to 24/26 mg BID as instructed.  BMET repeated 1 week later had returned to normal. Overall he is feeling well today. No dizziness, lightheadedness, CP or palpitations. No SOB/DOE. Weight at home has been ~180-181 lbs and trending up. Weight in clinic up 9 lbs from last visit (patient was dehydrated at that visit). No LEE, PND or orthopnea. Appetite has been good. Has been trying to limit salt intake. BPs at home have been up trending (see log below). His last 2 readings have been SBP > 150. BP in clinic today 172/92. He took all his morning medications:   BP log since 08/07/21:  120/82 107/62 104/72 112/75 121/74 118/75 128/82 145/87 134/87 132/83 132/82 142/80 142/80 123/84 145/93 166/95 153/101     HF Medications: Entresto 24/26 mg BID Jardiance 10 mg daily  Has the patient been experiencing any side effects to the medications prescribed?  no  Does the patient have any problems obtaining medications due to transportation or finances?   No - now has [facility] Medicare. Copay for Entresto and Jardiance are $25.00. Fills at [facility].    Understanding of regimen: good Understanding of indications: good Potential of compliance: good Patient understands to avoid NSAIDs. Patient understands to avoid decongestants.    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 - NYHA II, euvolemic on exam but weight has been increasing.  - Add  torsemide 20 mg PRN only - Increase Entresto to 49/51 mg BID. Repeat BMET in 3 weeks.  - Continue Jardiance 10 mg daily.  - No ? blocker (Coreg) yet, add back as able.   2. CAD - Diagnostic LHC w/ severe 2V CAD, mid-distal LAD 100% stenosed, ost-prox RCA 100% stenosed  - cMRI (11/220: EF 21% and larger inferior and apical scars with minimal viability in the RCA/LAD territories. Large LV clot.  - No options for revascularization, continue medical therapy. - Denies CP - ASA 81 mg daily + atorvastatin 80 nighlty (LDL 123) - Holding ? blocker due to low output HF    3. LV Thrombus  - 2/2 severe LV dysfunction  - Continue Xarelto 20 mg daily   4. Bilateral Renal Masses, suspicion for RCC - Incidental finding on chest CT - Not amenable to coiling per IR - Followed by Urology     5. Hypertension  - BP elevated at home and in clinic (172/92) - Increase Entresto to 49/51 mg BID as above. He will notify the clinic if he gets dizzy or if SBP<90.    6. HLD w/ LDL Goal < 70 - LDL 123 mg/dL. LFTs ok.  - Continue statin - Check FLP (1/23)   7. Thrombocytopenia  - Recent plts 286 - ? Acute vs chronic. Baseline unknown.    8. Ascending Thoracic Aortic Aneurysm - 43 mm in diameter on chest CT - Tricuspid AoV on echo. No AI/AS - BP control per above + statin.  - Will need annual scans for surveillance     Follow up 3 weeks with [place], PharmD, BCPS, BCCP, CPP Heart Failure Clinic Pharmacist [phone]

## 2021-08-12 ENCOUNTER — Other Ambulatory Visit (HOSPITAL_COMMUNITY): Payer: Self-pay

## 2021-08-14 ENCOUNTER — Ambulatory Visit (HOSPITAL_COMMUNITY)
Admission: RE | Admit: 2021-08-14 | Discharge: 2021-08-14 | Disposition: A | Discharge status: Home / Self Care | Payer: Medicare Other | Source: Ambulatory Visit | Attending: Internal Medicine | Admitting: Internal Medicine

## 2021-08-14 ENCOUNTER — Other Ambulatory Visit: Payer: Self-pay

## 2021-08-14 ENCOUNTER — Other Ambulatory Visit (HOSPITAL_COMMUNITY): Payer: Self-pay

## 2021-08-14 DIAGNOSIS — I5022 Chronic systolic (congestive) heart failure: Secondary | ICD-10-CM | POA: Diagnosis not present

## 2021-08-14 LAB — BASIC METABOLIC PANEL
Anion gap: 7 (ref 5–15)
BUN: 14 mg/dL (ref 8–23)
CO2: 26 mmol/L (ref 22–32)
Calcium: 8.6 mg/dL — ABNORMAL LOW (ref 8.9–10.3)
Chloride: 104 mmol/L (ref 98–111)
Creatinine, Ser: 1.52 mg/dL — ABNORMAL HIGH (ref 0.61–1.24)
GFR, Estimated: 49 mL/min — ABNORMAL LOW (ref >60)
Glucose, Bld: 113 mg/dL — ABNORMAL HIGH (ref 70–99)
Potassium: 4.2 mmol/L (ref 3.5–5.1)
Sodium: 137 mmol/L (ref 135–145)

## 2021-08-25 ENCOUNTER — Encounter (HOSPITAL_COMMUNITY): Payer: Self-pay | Admitting: Radiology

## 2021-08-26 ENCOUNTER — Ambulatory Visit (HOSPITAL_COMMUNITY)
Admission: RE | Admit: 2021-08-26 | Discharge: 2021-08-26 | Disposition: A | Discharge status: Home / Self Care | Payer: Medicare Other | Source: Ambulatory Visit | Attending: Cardiology | Admitting: Cardiology

## 2021-08-26 ENCOUNTER — Other Ambulatory Visit (HOSPITAL_COMMUNITY): Payer: Self-pay

## 2021-08-26 ENCOUNTER — Other Ambulatory Visit: Payer: Self-pay

## 2021-08-26 VITALS — BP 172/92 | HR 65 | Wt 188.2 lb

## 2021-08-26 DIAGNOSIS — I7121 Aneurysm of the ascending aorta, without rupture: Secondary | ICD-10-CM | POA: Diagnosis not present

## 2021-08-26 DIAGNOSIS — E785 Hyperlipidemia, unspecified: Secondary | ICD-10-CM | POA: Diagnosis not present

## 2021-08-26 DIAGNOSIS — I110 Hypertensive heart disease with heart failure: Secondary | ICD-10-CM | POA: Diagnosis not present

## 2021-08-26 DIAGNOSIS — D696 Thrombocytopenia, unspecified: Secondary | ICD-10-CM | POA: Insufficient documentation

## 2021-08-26 DIAGNOSIS — I2510 Atherosclerotic heart disease of native coronary artery without angina pectoris: Secondary | ICD-10-CM | POA: Diagnosis not present

## 2021-08-26 DIAGNOSIS — I5022 Chronic systolic (congestive) heart failure: Secondary | ICD-10-CM

## 2021-08-26 DIAGNOSIS — I5082 Biventricular heart failure: Secondary | ICD-10-CM | POA: Diagnosis present

## 2021-08-26 DIAGNOSIS — I513 Intracardiac thrombosis, not elsewhere classified: Secondary | ICD-10-CM | POA: Diagnosis not present

## 2021-08-26 DIAGNOSIS — Z79899 Other long term (current) drug therapy: Secondary | ICD-10-CM | POA: Insufficient documentation

## 2021-08-26 DIAGNOSIS — I255 Ischemic cardiomyopathy: Secondary | ICD-10-CM | POA: Insufficient documentation

## 2021-08-26 MED ORDER — TORSEMIDE 20 MG PO TABS: 20.0000 mg | ORAL_TABLET | Freq: Every day | ORAL | 5 refills | Status: DC | PRN

## 2021-08-26 MED ORDER — RIVAROXABAN 20 MG PO TABS
20.0000 mg | ORAL_TABLET | Freq: Every day | ORAL | 11 refills | Status: DC
Start: 2021-08-26 — End: 2021-09-25

## 2021-08-26 MED ORDER — SACUBITRIL-VALSARTAN 49-51 MG PO TABS: 1.0000 | ORAL_TABLET | Freq: Two times a day (BID) | ORAL | 11 refills | Status: DC

## 2021-08-26 MED ORDER — ASPIRIN 81 MG PO TBEC: 81.0000 mg | DELAYED_RELEASE_TABLET | Freq: Every day | ORAL | 11 refills | Status: DC

## 2021-08-26 MED ORDER — EMPAGLIFLOZIN 10 MG PO TABS: 10.0000 mg | ORAL_TABLET | Freq: Every day | ORAL | 11 refills | Status: DC

## 2021-08-26 MED ORDER — ATORVASTATIN CALCIUM 80 MG PO TABS
80.0000 mg | ORAL_TABLET | Freq: Every day | ORAL | 11 refills | Status: DC
Start: 2021-08-26 — End: 2022-02-05

## 2021-08-26 NOTE — Patient Instructions (Addendum)
It was a pleasure seeing you today!  MEDICATIONS: -We are changing your medications today -Increase Entresto to 49/51 mg (1 tablet) twice daily. You may take 2 tablets of the 24/26 mg strength twice daily until you receive the new strength.   -Call if you have questions about your medications.   NEXT APPOINTMENT: Return to clinic in 3 weeks with APP Clinic.  In general, to take care of your heart failure: -Limit your fluid intake to 2 Liters (half-gallon) per day.   -Limit your salt intake to ideally 2-3 grams (2000-3000 mg) per day. -Weigh yourself daily and record, and bring that "weight diary" to your next appointment.  (Weight gain of 2-3 pounds in 1 day typically means fluid weight.) -The medications for your heart are to help your heart and help you live longer.   -Please contact us before stopping any of your heart medications.  Call the clinic at [phone] with questions or to reschedule future appointments.

## 2021-09-16 ENCOUNTER — Encounter (HOSPITAL_COMMUNITY): Payer: Self-pay

## 2021-09-16 NOTE — Progress Notes (Signed)
ADVANCED HF CLINIC  NOTE  Primary Care: HF Cardiologist:  Dr. Haroldine Laws  HPI: Troy May is a 71 y.o. male w/ recent diagnosis of HTN, HLD, CAD, and BiV CHF/iCM.  Admitted 07/24/21 w/ CP, Influenza A+. Hypertensive on arrival requiring labetalol gtt. EKG w/ nonspecific ST abnormalities. CT of chest negative for PE but + for cardiomegaly, coronary artery calcifications, mild aneurysmal dilatation of the ascending thoracic aorta 43 mm in; bilateral renal masses suspicious for RCC incidentally found. Cardiology consulted. Echo showed severe biventricular dysfunction. LVEF severely reduced, 25-30% w/ large LV apical thrombus measuring 2.5cm x 1.8 cm, GIIDD, RV moderately reduced. Underwent R/LHC showing severe 2V CAD w/ occluded LAD and RCA, severe iCM, EF 25%, and elevated filling pressures w/ low output, CI 1.7. No good options for revascularization and continued on medical therapy. cMRI showed LVEF 21%, LAD and RCA territories with scar and do not appear viable, large LV thrombus. Started on Xarelto, diuresed with IV Lasix, transitioned to PO torsemide and GDMT titrated; no beta blocker w/ low output. Hospitalization c/b hypertension requiring labetalol, AKI likely cardiorenal & overdiuresis, and + influenza A.  Discharged home, weight 172 lbs.  Today he returns for HF follow up with his wife. Overall feeling fine. He is not SOB with activity or carrying his 63-month old granddaughter. Denies abnormal bleeding, palpitations, CP, dizziness, edema, or PND/Orthopnea. Appetite ok. No fever or chills. Weight at home 182 pounds. Taking all medications. Took torsemide a couple times last month.     Cardiac Studies: - Echo (11/22): EF 25-30%, RV moderately reduced, GIIDD - cMRI (11/22): EF 21% and minimal viability in RCA and LAD territories  - [facility] 07/27/21:   Ost RCA to Prox RCA lesion is 100% stenosed.   Prox Cx to Mid Cx lesion is 30% stenosed.   1st Mrg lesion is 20% stenosed.   Prox LAD lesion  is 60% stenosed.   Mid LAD lesion is 90% stenosed.   Mid LAD to Dist LAD lesion is 100% stenosed.   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 Swallow whole. 30 tablet 11   atorvastatin (LIPITOR) 80 MG tablet Take 1 tablet (80 mg total) by mouth daily. 30 tablet 11   cetirizine (ZYRTEC) 10 MG tablet Take 10 mg by mouth daily as needed for allergies.     empagliflozin (JARDIANCE) 10 MG TABS tablet Take 1 tablet (10 mg total) by mouth daily. 30 tablet 11   OVER THE COUNTER MEDICATION Take 1 tablet by mouth daily. gummy     rivaroxaban (XARELTO) 20 MG TABS tablet Take 1 tablet (20 mg total) by mouth daily with supper. 30 tablet 11   sacubitril-valsartan (ENTRESTO) 49-51 MG  Take 1 tablet by mouth 2 (two) times daily. 60 tablet 11   torsemide (DEMADEX) 20 MG tablet Take 1 tablet (20 mg total) by mouth daily as needed (fluid retention). 30 tablet 5   No current facility-administered medications for this encounter.   No Known Allergies  Social History   Socioeconomic History   Marital status: Married    Spouse name: Not on file   Number of children: Not on file   Years of education: Not on file   Highest education level: Not on file  Occupational History   Not on file  Tobacco Use   Smoking status: Some Days    Types: Cigarettes    Smokeless tobacco: Not on file  Substance and Sexual Activity   Alcohol use: Not on file   Drug use: Not on file   Sexual activity: Not on file  Other Topics Concern   Not on file  Social History Narrative   Not on file   Social Determinants of Health   Financial Resource Strain: Not on file  Food Insecurity: Not on file  Transportation Needs: Not on file  Physical Activity: Not on file  Stress: Not on file  Social Connections: Not on file  Intimate Partner Violence: Not on file   Family History  Problem Relation Age of Onset   Hypertension Brother    Heart failure Neg Hx    BP 130/78    Pulse 73    Wt 85.2 kg (187 lb 12.8 oz)    SpO2 98%    BMI 26.95 kg/m   Wt Readings from Last 3 Encounters:  09/18/21 85.2 kg (187 lb 12.8 oz)  08/26/21 85.4 kg (188 lb 3.2 oz)  08/05/21 81.3 kg (179 lb 3.2 oz)   PHYSICAL EXAM: General:  NAD. No resp difficulty, thin HEENT: Normal Neck: Supple. No JVD. Carotids 2+ bilat; no bruits. No lymphadenopathy or thryomegaly appreciated. Cor: PMI nondisplaced. Regular rate & rhythm. No rubs, gallops or murmurs. Lungs: Clear Abdomen: Soft, nontender, nondistended. No hepatosplenomegaly. No bruits or masses. Good bowel sounds. Extremities: No cyanosis, clubbing, rash, edema Neuro: Alert & oriented x 3, cranial nerves grossly intact. Moves all 4 extremities w/o difficulty. Affect pleasant.  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 - NYHA II, volume looks good today. - Restart spiro 12.5 mg daily (previously off with elevated SCr).  - Continue Entresto 49/51 mg bid. - Continue torsemide 20 mg PRN - Continue Jardiance 10 mg daily.  - No ? blocker (Coreg) yet, add back next - BMET today, repeat in 1 week.  2.  CAD - Diagnostic LHC w/ severe 2V CAD, mid-distal LAD 100% stenosed, ost-prox RCA 100% stenosed  - cMRI (11/220: EF 21% and larger inferior and apical scars with minimal viability in the RCA/LAD territories. Large LV clot.  - No options for revascularization, continue medical therapy. - Denies CP. - ASA 81 mg daily + atorvastatin 80. - ? blocker soon.   3. LV Thrombus  - 2/2 severe LV dysfunction  - Continue Xarelto 20 mg daily, Consider cutting back to 15 mg daily if CrCl consistently < 50 ml/min - CBC today.   4. Bilateral Renal Masses, suspicion for RCC - Incidental finding on chest CT - Not amenable to  coiling per IR - Plans to follow up with Urology in 6 months.    5. Hypertension  - BP now improved with GDMT - Med changes as above.   6. HLD w/ LDL Goal < 70 - LDL 123 mg/dL. LFTs ok.  - Continue statin - Check lipids today.   7. Ascending Thoracic Aortic Aneurysm - 43 mm in diameter on chest CT - Tricuspid AoV on echo. No AI/AS - BP control per above + statin.  - Will need annual scans for surveillance   Follow up with Dr. Haroldine Laws + echo in 2 months as scheduled.Allena Katz, FNP-BC 09/18/21

## 2021-09-18 ENCOUNTER — Ambulatory Visit (HOSPITAL_COMMUNITY)
Admission: RE | Admit: 2021-09-18 | Discharge: 2021-09-18 | Disposition: A | Discharge status: Home / Self Care | Payer: Medicare Other | Source: Ambulatory Visit | Attending: Family Medicine | Admitting: Family Medicine

## 2021-09-18 ENCOUNTER — Other Ambulatory Visit: Payer: Self-pay

## 2021-09-18 ENCOUNTER — Encounter (HOSPITAL_COMMUNITY): Payer: Self-pay

## 2021-09-18 VITALS — BP 130/78 | HR 73 | Wt 187.8 lb

## 2021-09-18 DIAGNOSIS — Z7901 Long term (current) use of anticoagulants: Secondary | ICD-10-CM | POA: Insufficient documentation

## 2021-09-18 DIAGNOSIS — I5082 Biventricular heart failure: Secondary | ICD-10-CM | POA: Insufficient documentation

## 2021-09-18 DIAGNOSIS — Z7982 Long term (current) use of aspirin: Secondary | ICD-10-CM | POA: Diagnosis not present

## 2021-09-18 DIAGNOSIS — I7121 Aneurysm of the ascending aorta, without rupture: Secondary | ICD-10-CM | POA: Diagnosis not present

## 2021-09-18 DIAGNOSIS — I513 Intracardiac thrombosis, not elsewhere classified: Secondary | ICD-10-CM | POA: Diagnosis not present

## 2021-09-18 DIAGNOSIS — E785 Hyperlipidemia, unspecified: Secondary | ICD-10-CM | POA: Diagnosis not present

## 2021-09-18 DIAGNOSIS — I2510 Atherosclerotic heart disease of native coronary artery without angina pectoris: Secondary | ICD-10-CM | POA: Diagnosis not present

## 2021-09-18 DIAGNOSIS — Z8249 Family history of ischemic heart disease and other diseases of the circulatory system: Secondary | ICD-10-CM | POA: Diagnosis not present

## 2021-09-18 DIAGNOSIS — I255 Ischemic cardiomyopathy: Secondary | ICD-10-CM | POA: Diagnosis not present

## 2021-09-18 DIAGNOSIS — I10 Essential (primary) hypertension: Secondary | ICD-10-CM

## 2021-09-18 DIAGNOSIS — N2889 Other specified disorders of kidney and ureter: Secondary | ICD-10-CM

## 2021-09-18 DIAGNOSIS — I5022 Chronic systolic (congestive) heart failure: Secondary | ICD-10-CM

## 2021-09-18 DIAGNOSIS — I110 Hypertensive heart disease with heart failure: Secondary | ICD-10-CM | POA: Diagnosis not present

## 2021-09-18 LAB — BASIC METABOLIC PANEL
Anion gap: 7 (ref 5–15)
BUN: 14 mg/dL (ref 8–23)
CO2: 24 mmol/L (ref 22–32)
Calcium: 8.7 mg/dL — ABNORMAL LOW (ref 8.9–10.3)
Chloride: 107 mmol/L (ref 98–111)
Creatinine, Ser: 1.26 mg/dL — ABNORMAL HIGH (ref 0.61–1.24)
GFR, Estimated: >60 mL/min (ref >60)
Glucose, Bld: 66 mg/dL — ABNORMAL LOW (ref 70–99)
Potassium: 4.2 mmol/L (ref 3.5–5.1)
Sodium: 138 mmol/L (ref 135–145)

## 2021-09-18 LAB — LIPID PANEL
Cholesterol: 116 mg/dL (ref 0–200)
HDL: 40 mg/dL — ABNORMAL LOW (ref >40)
LDL Cholesterol: 67 mg/dL (ref 0–99)
Total CHOL/HDL Ratio: 2.9 RATIO
Triglycerides: 46 mg/dL (ref <150)
VLDL: 9 mg/dL (ref 0–40)

## 2021-09-18 MED ORDER — SPIRONOLACTONE 25 MG PO TABS: 12.5000 mg | ORAL_TABLET | Freq: Every day | ORAL | 0 refills | Status: DC

## 2021-09-18 NOTE — Patient Instructions (Addendum)
Thank you for coming in today  Labs were done today, if any labs are abnormal the clinic will call you  Your physician recommends that you return for lab work in: 1 week  Your physician recommends that you schedule a follow-up appointment in:  please keep follow up appointment  RESTART Spiolactone 12.5 mg 1/2 tablet daily   At the [facility], you and your health needs are our priority. As part of our continuing mission to provide you with exceptional heart care, we have created designated Provider Care Teams. These Care Teams include your primary Cardiologist (physician) and Advanced Practice Providers (APPs- Physician Assistants and Nurse Practitioners) who all work together to provide you with the care you need, when you need it.   You may see any of the following providers on your designated Care Team at your next follow up: Dr Glori Bickers Dr Haynes Kerns, NP Lyda Jester, [place] [facility] [place], [place] Audry Riles, PharmD   Please be sure to bring in all your medications bottles to every appointment.   If you have any questions or concerns before your next appointment please send [place] a message through [place] or call our office at [phone].    TO LEAVE A MESSAGE FOR THE NURSE SELECT OPTION 2, PLEASE LEAVE A MESSAGE INCLUDING: YOUR NAME DATE OF BIRTH CALL BACK NUMBER REASON FOR CALL**this is important as we prioritize the call backs  YOU WILL RECEIVE A CALL BACK THE SAME DAY AS LONG AS YOU CALL BEFORE 4:00 PM

## 2021-09-25 ENCOUNTER — Ambulatory Visit (HOSPITAL_COMMUNITY)
Admission: RE | Admit: 2021-09-25 | Discharge: 2021-09-25 | Disposition: A | Discharge status: Home / Self Care | Payer: Medicare Other | Source: Ambulatory Visit | Attending: Internal Medicine | Admitting: Internal Medicine

## 2021-09-25 ENCOUNTER — Ambulatory Visit: Payer: Self-pay | Admitting: Nurse Practitioner

## 2021-09-25 ENCOUNTER — Other Ambulatory Visit (HOSPITAL_COMMUNITY): Payer: Self-pay

## 2021-09-25 ENCOUNTER — Other Ambulatory Visit: Payer: Self-pay

## 2021-09-25 DIAGNOSIS — I5022 Chronic systolic (congestive) heart failure: Secondary | ICD-10-CM

## 2021-09-25 LAB — BASIC METABOLIC PANEL
Anion gap: 5 (ref 5–15)
BUN: 12 mg/dL (ref 8–23)
CO2: 27 mmol/L (ref 22–32)
Calcium: 8.9 mg/dL (ref 8.9–10.3)
Chloride: 106 mmol/L (ref 98–111)
Creatinine, Ser: 1.34 mg/dL — ABNORMAL HIGH (ref 0.61–1.24)
GFR, Estimated: 57 mL/min — ABNORMAL LOW (ref >60)
Glucose, Bld: 109 mg/dL — ABNORMAL HIGH (ref 70–99)
Potassium: 4.4 mmol/L (ref 3.5–5.1)
Sodium: 138 mmol/L (ref 135–145)

## 2021-09-25 MED ORDER — RIVAROXABAN 20 MG PO TABS: 20.0000 mg | ORAL_TABLET | Freq: Every day | ORAL | 11 refills | Status: DC

## 2021-09-25 NOTE — Progress Notes (Signed)
error 

## 2021-10-29 NOTE — Progress Notes (Signed)
? ?ADVANCED HF CLINIC  NOTE ? ?Primary Care: ?HF Cardiologist:  Dr. Haroldine Laws ? ?HPI: ?Troy May is a 71 y.o. male w/ recent diagnosis of HTN, HLD, CAD, and BiV CHF/iCM. ? ?Admitted 11/22 w/ CP, Influenza A+. Hypertensive on arrival requiring labetalol gtt, CT chest - PE but + for cardiomegaly, coronary CA2+, mild aneurysmal dilatation of the ascending thoracic aorta 43 mm; bilateral renal masses suspicious for RCC. ? ?Echo 11/22 EF 25-30% w/LV apical thrombus G2DD, RV moderately HK. R/LHC severe 2V CAD w/ occluded LAD & RCA,  elevated filling pressures w/ low output, CI 1.7. No good options for revasc -> med rx. cMRI LVEF 21%, LAD/RCA territories with scar and do not appear viable, large LV thrombus. Discharge weight 172 lbs. ? ?Today he returns for HF follow up with his wife. Says he feels ok. No CP or SOB. Waiting for the green light to start working around the house. Able to do ADLs and go to the store without a problem. No problem with medications.  ? ?  ? Cardiac Studies: ?- Echo (11/22): EF 25-30%, RV moderately reduced, GIIDD ?- cMRI (11/22): EF 21% and minimal viability in RCA and LAD territories ? ?- R/LHC 07/27/21: ?  Ost RCA to Prox RCA lesion is 100% stenosed. ?  Prox Cx to Mid Cx lesion is 30% stenosed. ?  1st Mrg lesion is 20% stenosed. ?  Prox LAD lesion is 60% stenosed. ?  Mid LAD lesion is 90% stenosed. ?  Mid LAD to Dist LAD lesion is 100% stenosed. ?  ?Findings: ?Ao = 143/88 (109)  ?LV = 146/25 ?RA =  8 ?RV = 41/12 ?PA = 45/20 (29) ?PCW = 21 ?Fick cardiac output/index = 3.4/1.7 ?PVR = 2.4 WU ?SVR = 2383  ?FA sat = 95% ?PA sat = 54%, 57% ?PAPi 3.125  ?  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 ?Past Medical History:  ?Diagnosis Date  ? Ascending aortic aneurysm   ? Bilateral renal masses   ? CAD (coronary artery disease)   ? HLD (hyperlipidemia)   ? Hypertension   ? LV (left ventricular) mural thrombus   ? Systolic  heart failure ([place])   ? ?Current Outpatient Medications  ?Medication Sig Dispense Refill  ? aspirin 81 MG EC tablet Take 1 tablet (81 mg total) by mouth daily. Swallow whole. 30 tablet 11  ? atorvastatin (LIPITOR) 80 MG tablet Take 1 tablet (80 mg total) by mouth daily. 30 tablet 11  ? cetirizine (ZYRTEC) 10 MG tablet Take 10 mg by mouth daily as needed for allergies.    ? empagliflozin (JARDIANCE) 10 MG TABS tablet Take 1 tablet (10 mg total) by mouth daily. 30 tablet 11  ? OVER THE COUNTER MEDICATION Take 1 tablet by mouth daily. gummy    ? rivaroxaban (XARELTO) 20 MG TABS tablet Take 1 tablet (20 mg total) by mouth daily with supper. 30 tablet 11  ? sacubitril-valsartan (ENTRESTO) 49-51 MG Take 1 tablet by mouth 2 (two) times daily. 60 tablet 11  ? spironolactone (ALDACTONE) 25 MG tablet Take 0.5 tablets (12.5 mg total) by mouth daily. 90 tablet 0  ? torsemide (DEMADEX) 20 MG tablet Take 1 tablet (20 mg total) by mouth daily as needed (fluid retention). 30 tablet 5  ? ?No current facility-administered medications for this encounter.  ? ?No Known Allergies ? ?Social History  ? ?Socioeconomic History  ? Marital status: Married  ?  Spouse name: Not on file  ? Number of children: Not on file  ? Years of education: Not on file  ? Highest education level: Not on file  ?Occupational History  ? Not on file  ?Tobacco Use  ? Smoking status: Some Days  ?  Types: Cigarettes  ? Smokeless tobacco: Not on file  ?Substance and Sexual Activity  ? Alcohol use: Not on file  ? Drug use: Not on file  ? Sexual activity: Not on file  ?Other Topics Concern  ? Not on file  ?Social History Narrative  ? Not on file  ? ?Social Determinants of Health  ? ?Financial Resource Strain: Not on file  ?Food Insecurity: Not on file  ?Transportation Needs: Not on file  ?Physical Activity: Not on file  ?Stress: Not on file  ?Social Connections: Not on file  ?Intimate Partner Violence: Not on file  ? ?Family History  ?Problem Relation Age of Onset  ?  Hypertension Brother   ? Heart failure Neg Hx   ? ?BP 120/82   Pulse 62   Wt 85.5 kg (188 lb 6.4 oz)   SpO2 96%   BMI 27.03 kg/m?  ? ?Wt Readings from Last 3 Encounters:  ?10/30/21 85.5 kg (188 lb 6.4 oz)  ?09/18/21 85.2 kg (187 lb 12.8 oz)  ?08/26/21 85.4 kg (188 lb 3.2 oz)  ? ?PHYSICAL EXAM: ?General:  Well appearing. No resp difficulty ?HEENT: normal ?Neck: supple. no JVD. Carotids 2+ bilat; no bruits. No lymphadenopathy or thryomegaly appreciated. ?Cor: PMI nondisplaced. Regular rate & rhythm. No rubs, gallops or murmurs. ?Lungs: clear ?Abdomen: soft, nontender, nondistended. No hepatosplenomegaly. No bruits or masses. Good bowel sounds. ?Extremities: no cyanosis, clubbing, rash, edema ?Neuro: alert & orientedx3, cranial nerves grossly intact. moves all 4 extremities w/o difficulty. Affect pleasant ? ? 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 ?- Echo today 10/30/21 EF 25-30% + apical clot. Moderate MR/TR ?- NYHA II, volume looks good today. ?- Continue spiro 12.5 mg daily  ?- Continue Entresto 49/51 mg bid. ?- Continue torsemide 20 mg PRN ?- Continue Jardiance 10 mg daily.  ?- HR low but will attempt Toprol XL 25  ?- Refer to EP for ICD ?- Labs today ? ?2. CAD ?- Cath 11/22 severe 2V CAD, mid-distal LAD 100% stenosed, ost-prox RCA 100% stenosed  ?- cMRI 11/22  EF 21% and larg  inferior and apical scars with minimal viability in the RCA/LAD territories. Large LV clot.  ?- No options for revascularization, continue medical therapy. ?- No s/s angina ?- ASA 81 mg daily + atorvastatin 80. ?- ? blocker today ?- Refer to CR ?  ?3. LV Thrombus  ?- 2/2 severe LV dysfunction  ?- Continue Xarelto 20 mg daily, Consider cutting back to 15 mg daily if CrCl consistently < 50  ml/min ?- CBC today. ?  ?4. Bilateral Renal Masses, suspicion for RCC ?- Incidental finding on chest CT ?- Not amenable to coiling per IR ?- Following with Urology ?  ?5. Hypertension  ?- BP now improved with GDMT ? ?7. Ascending Thoracic Aortic Aneurysm ?- 43 mm in diameter on chest CT ?- Asc Ao 4.0 cm on echo today ? ?Glori Bickers, MD  ?10:25 AM ? ? ?

## 2021-10-30 ENCOUNTER — Other Ambulatory Visit: Payer: Self-pay

## 2021-10-30 ENCOUNTER — Encounter (HOSPITAL_COMMUNITY): Payer: Self-pay | Admitting: Internal Medicine

## 2021-10-30 ENCOUNTER — Ambulatory Visit (HOSPITAL_BASED_OUTPATIENT_CLINIC_OR_DEPARTMENT_OTHER)
Admission: RE | Admit: 2021-10-30 | Discharge: 2021-10-30 | Disposition: A | Discharge status: Home / Self Care | Payer: Medicare Other | Source: Ambulatory Visit | Attending: Internal Medicine | Admitting: Internal Medicine

## 2021-10-30 ENCOUNTER — Ambulatory Visit (HOSPITAL_COMMUNITY)
Admission: RE | Admit: 2021-10-30 | Discharge: 2021-10-30 | Disposition: A | Discharge status: Home / Self Care | Payer: Medicare Other | Source: Ambulatory Visit | Attending: Internal Medicine | Admitting: Internal Medicine

## 2021-10-30 VITALS — BP 120/82 | HR 62 | Wt 188.4 lb

## 2021-10-30 DIAGNOSIS — Z09 Encounter for follow-up examination after completed treatment for conditions other than malignant neoplasm: Secondary | ICD-10-CM | POA: Insufficient documentation

## 2021-10-30 DIAGNOSIS — I081 Rheumatic disorders of both mitral and tricuspid valves: Secondary | ICD-10-CM | POA: Diagnosis not present

## 2021-10-30 DIAGNOSIS — I2510 Atherosclerotic heart disease of native coronary artery without angina pectoris: Secondary | ICD-10-CM | POA: Diagnosis not present

## 2021-10-30 DIAGNOSIS — Z86718 Personal history of other venous thrombosis and embolism: Secondary | ICD-10-CM | POA: Insufficient documentation

## 2021-10-30 DIAGNOSIS — I513 Intracardiac thrombosis, not elsewhere classified: Secondary | ICD-10-CM

## 2021-10-30 DIAGNOSIS — E785 Hyperlipidemia, unspecified: Secondary | ICD-10-CM | POA: Diagnosis not present

## 2021-10-30 DIAGNOSIS — I7121 Aneurysm of the ascending aorta, without rupture: Secondary | ICD-10-CM | POA: Insufficient documentation

## 2021-10-30 DIAGNOSIS — Z7901 Long term (current) use of anticoagulants: Secondary | ICD-10-CM | POA: Diagnosis not present

## 2021-10-30 DIAGNOSIS — I5082 Biventricular heart failure: Secondary | ICD-10-CM | POA: Insufficient documentation

## 2021-10-30 DIAGNOSIS — I5022 Chronic systolic (congestive) heart failure: Secondary | ICD-10-CM | POA: Insufficient documentation

## 2021-10-30 DIAGNOSIS — Z79899 Other long term (current) drug therapy: Secondary | ICD-10-CM | POA: Diagnosis not present

## 2021-10-30 DIAGNOSIS — I255 Ischemic cardiomyopathy: Secondary | ICD-10-CM | POA: Insufficient documentation

## 2021-10-30 DIAGNOSIS — N2889 Other specified disorders of kidney and ureter: Secondary | ICD-10-CM

## 2021-10-30 DIAGNOSIS — I110 Hypertensive heart disease with heart failure: Secondary | ICD-10-CM | POA: Insufficient documentation

## 2021-10-30 DIAGNOSIS — Z7984 Long term (current) use of oral hypoglycemic drugs: Secondary | ICD-10-CM | POA: Diagnosis not present

## 2021-10-30 DIAGNOSIS — Z7982 Long term (current) use of aspirin: Secondary | ICD-10-CM | POA: Insufficient documentation

## 2021-10-30 LAB — CBC
HCT: 47 % (ref 39.0–52.0)
Hemoglobin: 15.8 g/dL (ref 13.0–17.0)
MCH: 31.7 pg (ref 26.0–34.0)
MCHC: 33.6 g/dL (ref 30.0–36.0)
MCV: 94.4 fL (ref 80.0–100.0)
Platelets: 174 10*3/uL (ref 150–400)
RBC: 4.98 MIL/uL (ref 4.22–5.81)
RDW: 14.6 % (ref 11.5–15.5)
WBC: 5 10*3/uL (ref 4.0–10.5)
nRBC: 0 % (ref 0.0–0.2)

## 2021-10-30 LAB — BASIC METABOLIC PANEL
Anion gap: 7 (ref 5–15)
BUN: 13 mg/dL (ref 8–23)
CO2: 25 mmol/L (ref 22–32)
Calcium: 9.2 mg/dL (ref 8.9–10.3)
Chloride: 107 mmol/L (ref 98–111)
Creatinine, Ser: 1.22 mg/dL (ref 0.61–1.24)
GFR, Estimated: >60 mL/min (ref >60)
Glucose, Bld: 95 mg/dL (ref 70–99)
Potassium: 5.2 mmol/L — ABNORMAL HIGH (ref 3.5–5.1)
Sodium: 139 mmol/L (ref 135–145)

## 2021-10-30 LAB — ECHOCARDIOGRAM COMPLETE
AR max vel: 3.03 cm2
AV Peak grad: 5.8 mmHg
Ao pk vel: 1.2 m/s
Area-P 1/2: 2.42 cm2
S' Lateral: 4.9 cm
Single Plane A4C EF: 29.1 %

## 2021-10-30 LAB — BRAIN NATRIURETIC PEPTIDE: B Natriuretic Peptide: 743.9 pg/mL — ABNORMAL HIGH (ref 0.0–100.0)

## 2021-10-30 MED ORDER — METOPROLOL SUCCINATE ER 25 MG PO TB24: 25.0000 mg | ORAL_TABLET | Freq: Every day | ORAL | 11 refills | Status: DC

## 2021-10-30 NOTE — Progress Notes (Signed)
Echocardiogram ?2D Echocardiogram has been performed. ? ?Troy May ?10/30/2021, 9:33 AM ?

## 2021-10-30 NOTE — Patient Instructions (Signed)
Medication Changes: ? ?Start Toprol 25 mg daily at bedtime ? ?Lab Work: ? ?Labs done today, your results will be available in MyChart, we will contact you for abnormal readings. ? ? ?Testing/Procedures: ? ?none ? ?Referrals: ? ?You have been referred to Cardiology Electrophysiologist. They will call you to arrange your appointment. ? ?You have been referred to Cardiac Rehab. They will call you to arrange your appointment ? ?Special Instructions // Education: ? ?none ? ?Follow-Up in: 3 months  ? ?At the [facility], you and your health needs are our priority. We have a designated team specialized in the treatment of Heart Failure. This Care Team includes your primary Heart Failure Specialized Cardiologist (physician), Advanced Practice Providers (APPs- Physician Assistants and Nurse Practitioners), and Pharmacist who all work together to provide you with the care you need, when you need it.  ? ?You may see any of the following providers on your designated Care Team at your next follow up: ? ?Dr Glori Bickers ?Dr Loralie Champagne ?Darrick Grinder, NP ?Lyda Jester, PA ?Jessica Milford,NP ?Marlyce Huge, PA ?Audry Riles, PharmD ? ? ?Please be sure to bring in all your medications bottles to every appointment.  ? ?Need to Contact us: ? ?If you have any questions or concerns before your next appointment please send [place] a message through [place] or call our office at [phone].   ? ?TO LEAVE A MESSAGE FOR THE NURSE SELECT OPTION 2, PLEASE LEAVE A MESSAGE INCLUDING: ?YOUR NAME ?DATE OF BIRTH ?CALL BACK NUMBER ?REASON FOR CALL**this is important as we prioritize the call backs ? ?YOU WILL RECEIVE A CALL BACK THE SAME DAY AS LONG AS YOU CALL BEFORE 4:00 PM ? ? ?

## 2021-11-03 ENCOUNTER — Encounter (HOSPITAL_COMMUNITY): Payer: Self-pay

## 2021-11-03 ENCOUNTER — Telehealth (HOSPITAL_COMMUNITY): Payer: Self-pay

## 2021-11-03 NOTE — Telephone Encounter (Signed)
Pt insurance is active and benefits verified through [facility] Medicare Co-pay 0, DED 0/0 met, out of pocket $3,200/$2.19 met, co-insurance 10%. no pre-authorization required. Passport, 11/03/2021_0 :27pm, REF# [id number] ?

## 2021-11-04 ENCOUNTER — Telehealth (HOSPITAL_COMMUNITY): Payer: Self-pay | Admitting: *Deleted

## 2021-11-04 ENCOUNTER — Ambulatory Visit: Payer: Self-pay | Admitting: Emergency Medicine

## 2021-11-04 NOTE — Telephone Encounter (Signed)
Reviewed post hospitalization follow up progress note with Dr. Haroldine Laws on 10/30/21. Pt making progress and referral made to EP for new consult for possible ICD.  This appt has not yet been scheduled.  Will need to complete this appt first prior to determine what the plan of care will be and the appropriate timing of group exercise.  Indicated to pt wife on the phone with the patient in the background on speaker phone that 3 attempts were made unsuccessfully to schedule EP consult.   Per pt wife, messages were not received.  Preferred contact information verified. Message sent to [facility] street office staff updating preferred contact phone number. Will monitor for scheduling this appt for EP consult. - 4/14  Maurice Small RN, BSN ?Cardiac and Pulmonary Rehab Nurse Navigator  ? ?

## 2021-12-02 ENCOUNTER — Other Ambulatory Visit: Payer: Self-pay | Admitting: Urology

## 2021-12-02 DIAGNOSIS — D49512 Neoplasm of unspecified behavior of left kidney: Secondary | ICD-10-CM

## 2021-12-02 DIAGNOSIS — D49511 Neoplasm of unspecified behavior of right kidney: Secondary | ICD-10-CM

## 2021-12-11 ENCOUNTER — Ambulatory Visit: Payer: Medicare Other | Admitting: Internal Medicine

## 2021-12-11 ENCOUNTER — Encounter: Payer: Self-pay | Admitting: Internal Medicine

## 2021-12-11 VITALS — BP 120/82 | HR 58 | Ht 70.5 in | Wt 189.0 lb

## 2021-12-11 DIAGNOSIS — E875 Hyperkalemia: Secondary | ICD-10-CM | POA: Diagnosis not present

## 2021-12-11 DIAGNOSIS — I5022 Chronic systolic (congestive) heart failure: Secondary | ICD-10-CM

## 2021-12-11 LAB — BASIC METABOLIC PANEL
BUN/Creatinine Ratio: 14 (ref 10–24)
BUN: 19 mg/dL (ref 8–27)
CO2: 26 mmol/L (ref 20–29)
Calcium: 9.1 mg/dL (ref 8.6–10.2)
Chloride: 102 mmol/L (ref 96–106)
Creatinine, Ser: 1.4 mg/dL — ABNORMAL HIGH (ref 0.76–1.27)
Glucose: 77 mg/dL (ref 70–99)
Potassium: 4.7 mmol/L (ref 3.5–5.2)
Sodium: 138 mmol/L (ref 134–144)
eGFR: 54 mL/min/{1.73_m2} — ABNORMAL LOW (ref >59)

## 2021-12-11 NOTE — Patient Instructions (Addendum)
Medication Instructions:  ?Your physician recommends that you continue on your current medications as directed. Please refer to the Current Medication list given to you today. ? ?*If you need a refill on your cardiac medications before your next appointment, please call your pharmacy* ? ? ?Lab Work: ?BMET today ? ?If you have labs (blood work) drawn today and your tests are completely normal, you will receive your results only by: ?MyChart Message (if you have MyChart) OR ?A paper copy in the mail ?If you have any lab test that is abnormal or we need to change your treatment, we will call you to review the results. ? ? ?Testing/Procedures: ?Your physician has recommended that you have a defibrillator inserted. An implantable cardioverter defibrillator (ICD) is a small device that is placed in your chest or, in rare cases, your abdomen. This device uses electrical pulses or shocks to help control life-threatening, irregular heartbeats that could lead the heart to suddenly stop beating (sudden cardiac arrest). Leads are attached to the ICD that goes into your heart. This is done in the hospital and usually requires an overnight stay. Please see the instruction sheet given to you today for more information.  ? ? ? ?Follow-Up: ?At [facility], you and your health needs are our priority.  As part of our continuing mission to provide you with exceptional heart care, we have created designated Provider Care Teams.  These Care Teams include your primary Cardiologist (physician) and Advanced Practice Providers (APPs -  Physician Assistants and Nurse Practitioners) who all work together to provide you with the care you need, when you need it. ? ?We recommend signing up for the patient portal called "MyChart".  Sign up information is provided on this After Visit Summary.  MyChart is used to connect with patients for Virtual Visits (Telemedicine).  Patients are able to view lab/test results, encounter notes, upcoming  appointments, etc.  Non-urgent messages can be sent to your provider as well.   ?To learn more about what you can do with MyChart, go to [email or web address].   ? ?Your next appointment:   ?Please contact our office if you decide you would like to move forward with an ICD implant. ? ?Important Information About [place] ? ? ? ? ?  ?

## 2021-12-11 NOTE — Progress Notes (Signed)
? ? ? ? ?ELECTROPHYSIOLOGY CONSULT NOTE  ?Patient ID: Troy May, MRN: [id number], DOB/AGE: [birth year] 71 y.o. ?Admit date: (Not on file) ?Date of Consult: 12/11/2021 ? ?Primary Physician: Horald Pollen, MD ?Primary Cardiologist: DB ?  ?  ?Troy May is a 71 y.o. male who is being seen today for the evaluation of ICD for primary prevention at the request of DB  ? ? ?HPI ?Troy May is a 71 y.o. male referred for consideration of an ICD for primary prevention. ? ?He presented 11/22 with congestive heart failure.  Evaluation demonstrated prior MI ischemic cardiomyopathy nonrevascularizable coronary disease and despite guideline directed therapy has had persistent left ventricular dysfunction. ? ?Currently he denies shortness of breath.  No lightheadedness palpitations peripheral edema.  He does have two-pillow orthopnea but no nocturnal dyspnea. ? ?DATE TEST EF   ?11/22 Echo   25-30 % AsAorta 65m  ?11/22 LHC  RCAo-p:100; LADm-d:100: ?CX 30  ?11/22 cMRI 21% LAD/RCA scar/non viable ?LV clot  ?3/23 Echo  25-30%   ? ?Date Cr K Hgb  ?3/23 1.22 5.2 15.8  ?      ? ? ? ? ?Past Medical History:  ?Diagnosis Date  ? Ascending aortic aneurysm ([facility]   ? Bilateral renal masses   ? CAD (coronary artery disease)   ? HLD (hyperlipidemia)   ? Hypertension   ? LV (left ventricular) mural thrombus   ? Systolic heart failure ([facility]   ?   ? ?Surgical History:  ?Past Surgical History:  ?Procedure Laterality Date  ? RIGHT/LEFT HEART CATH AND CORONARY ANGIOGRAPHY N/A 07/27/2021  ? Procedure: RIGHT/LEFT HEART CATH AND CORONARY ANGIOGRAPHY;  Surgeon: BJolaine Artist MD;  [facility];  Service: Cardiovascular;  Laterality: N/A;  ?  ? ?Home Meds: ?Current Meds  ?Medication Sig  ? aspirin 81 MG EC tablet Take 1 tablet (81 mg total) by mouth daily. Swallow whole.  ? atorvastatin (LIPITOR) 80 MG tablet Take 1 tablet (80 mg total) by mouth daily.  ? cetirizine (ZYRTEC) 10 MG tablet Take 10 mg by mouth daily as needed for  allergies.  ? empagliflozin (JARDIANCE) 10 MG TABS tablet Take 1 tablet (10 mg total) by mouth daily.  ? metoprolol succinate (TOPROL XL) 25 MG 24 hr tablet Take 1 tablet (25 mg total) by mouth at bedtime.  ? OVER THE COUNTER MEDICATION Take 1 tablet by mouth daily. gummy  ? rivaroxaban (XARELTO) 20 MG TABS tablet Take 1 tablet (20 mg total) by mouth daily with supper.  ? sacubitril-valsartan (ENTRESTO) 49-51 MG Take 1 tablet by mouth 2 (two) times daily.  ? spironolactone (ALDACTONE) 25 MG tablet Take 0.5 tablets (12.5 mg total) by mouth daily.  ? torsemide (DEMADEX) 20 MG tablet Take 1 tablet (20 mg total) by mouth daily as needed (fluid retention).  ? ? ?Allergies: No Known Allergies ? ?Social History  ? ?Socioeconomic History  ? Marital status: Married  ?  Spouse name: Not on file  ? Number of children: Not on file  ? Years of education: Not on file  ? Highest education level: Not on file  ?Occupational History  ? Not on file  ?Tobacco Use  ? Smoking status: Former  ?  Types: Cigarettes  ?  Quit date: 07/27/2021  ?  Years since quitting: 0.3  ? Smokeless tobacco: Not on file  ?Substance and Sexual Activity  ? Alcohol use: Not on file  ? Drug use: Not on file  ? Sexual activity: Not on  file  ?Other Topics Concern  ? Not on file  ?Social History Narrative  ? Not on file  ? ?Social Determinants of Health  ? ?Financial Resource Strain: Not on file  ?Food Insecurity: Not on file  ?Transportation Needs: Not on file  ?Physical Activity: Not on file  ?Stress: Not on file  ?Social Connections: Not on file  ?Intimate Partner Violence: Not on file  ?  ? ?Family History  ?Problem Relation Age of Onset  ? Hypertension Brother   ? Heart failure Neg Hx   ?  ? ?ROS:  Please see the history of present illness.     All other systems reviewed and negative.  ? ? ?Physical Exam:  ?Blood pressure 120/82, pulse (!) 58, height 5' 10.5" (1.791 m), weight 189 lb (85.7 kg), SpO2 98 %. ?General: Well developed, well nourished male in no  acute distress. ?Head: Normocephalic, atraumatic, sclera non-icteric, no xanthomas, nares are without discharge. ?EENT: normal  ?Lymph Nodes:  none ?Neck: Negative for carotid bruits. JVD not elevated. ?Back:without scoliosis kyphosis ?Lungs: Clear bilaterally to auscultation without wheezes, rales, or rhonchi. Breathing is unlabored. ?Heart: RRR with S1 S2. No  /6 systolic murmur . No rubs, or gallops appreciated. ?Abdomen: Soft, non-tender, non-distended with normoactive bowel sounds. No hepatomegaly. No rebound/guarding. No obvious abdominal masses. ?Msk:  Strength and tone appear normal for age. ?Extremities: No clubbing or cyanosis. No  edema.  Distal pedal pulses are 2+ and equal bilaterally. ?Skin: Warm and Dry ?Neuro: Alert and oriented X 3. CN III-XII intact Grossly normal sensory and motor function . ?Psych:  Responds to questions appropriately with a normal affect. ?  ?  ?  ? ?EKG  sinus at 58 ?Intervals 17/11/42 ?Q waves 2 3 and F and V4-V6 ? ? ?Assessment and Plan:  ?Ischemic cardiomyopathy ? ?Congestive heart failure-class IIa ? ?Hypertension ? ?Hyperkalemia ? ? ? ?Patient has persistent left ventricular dysfunction despite optimal guideline directed medical therapy.  He is appropriately considered for an ICD for primary prevention particularly in the context of ischemic cardiomyopathy, the issue being less a continuation and is seen in nonischemic patients. ? ?We have reviewed extensively his cardiomyopathy, its pathophysiology and the potential risks associate with congestive heart failure and sudden cardiac death. ? ?I have recommended a defibrillator.  He and his wife would like to think about this.  They will possibly get back with [place] but they may well want to wait until he sees Dr. Reine Just to review it again with him prior to making a decision either yea or nay ? ?We have discussed the defibrillator procedure the benefits as well as the potential risks including but not limited to death perforation  infection and inappropriate shock and lead dislodgment. ? ?Potassium low but elevated.  We will recheck BMET today ? ? ? ? ?Virl Axe  ?

## 2021-12-15 ENCOUNTER — Other Ambulatory Visit (HOSPITAL_COMMUNITY): Payer: Self-pay | Admitting: Family Medicine

## 2021-12-29 ENCOUNTER — Telehealth (HOSPITAL_COMMUNITY): Payer: Self-pay | Admitting: *Deleted

## 2021-12-29 NOTE — Telephone Encounter (Signed)
Last office note faxed to [facility] hospital at [phone] ?

## 2021-12-30 ENCOUNTER — Encounter (HOSPITAL_COMMUNITY): Payer: Self-pay | Admitting: *Deleted

## 2022-01-04 ENCOUNTER — Telehealth (HOSPITAL_COMMUNITY): Payer: Self-pay | Admitting: Pharmacy Technician

## 2022-01-04 NOTE — Telephone Encounter (Signed)
Advanced Heart Failure Patient Advocate Encounter ? ?Patient's wife called and requested help with some of the patient's medications. Called and spoke with the patient's wife. She was unsure of income at the time. Cannot proceed with assistance or grant application without knowing the patient's income. She stated she would call back with the information. ? ?Charlann Boxer, CPhT ? ?

## 2022-01-29 ENCOUNTER — Ambulatory Visit
Admission: RE | Admit: 2022-01-29 | Discharge: 2022-01-29 | Disposition: A | Discharge status: Home / Self Care | Payer: Medicare Other | Source: Ambulatory Visit | Attending: Urology | Admitting: Urology

## 2022-01-29 DIAGNOSIS — D49512 Neoplasm of unspecified behavior of left kidney: Secondary | ICD-10-CM

## 2022-01-29 DIAGNOSIS — D49511 Neoplasm of unspecified behavior of right kidney: Secondary | ICD-10-CM

## 2022-01-29 IMAGING — MR MR ABDOMEN WO/W CM
10 of 17 series · 27 of 48 positions shown · IV contrast (multihance)
Comparison: [DATE]

CLINICAL DATA: Follow-up of bilateral renal masses.

EXAM:
MRI ABDOMEN WITHOUT AND WITH CONTRAST
TECHNIQUE: Multiplanar multisequence MR imaging of the abdomen was performed
both before and after the administration of intravenous contrast.
CONTRAST:  17mL MULTIHANCE GADOBENATE DIMEGLUMINE 529 MG/ML IV SOLN

[Series 3: cor haste · coronal · 5.0mm · 0.68mm/px · 2 of 32 slices shown]
[im 1/32]
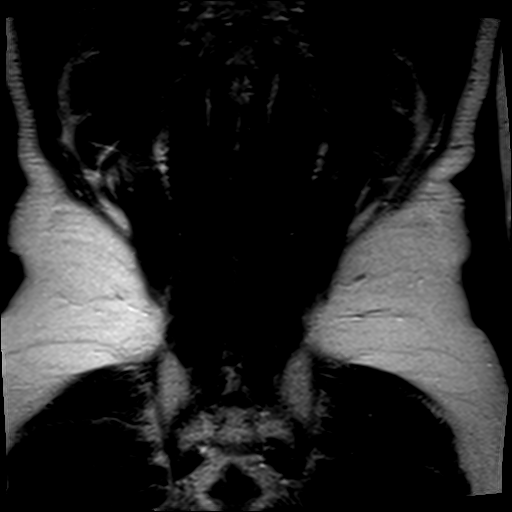
[im 32/32]
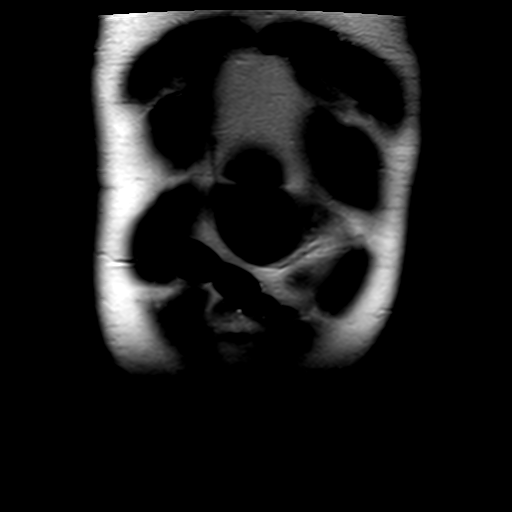

[Series 4: axial haste · axial · 6.0mm · 0.68mm/px · z∈[-179,+32]mm · 2 of 33 slices shown]
[im 1/33]
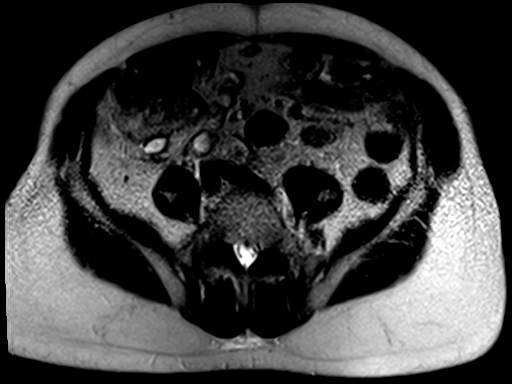
[im 33/33]
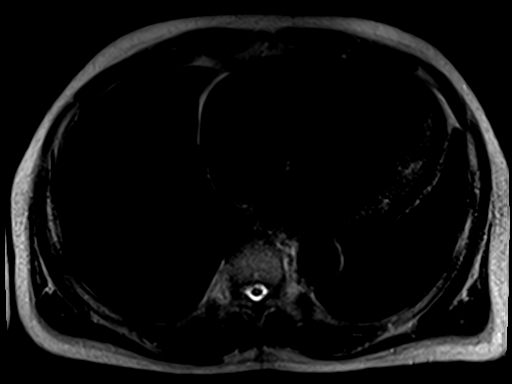

[Series 5: T1 · axial · 6.0mm · 0.68mm/px · z∈[-179,+32]mm · 4 of 66 slices shown]
[im 1/66]
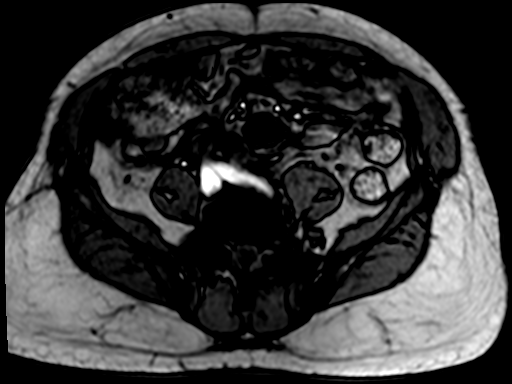
[im 22/66]
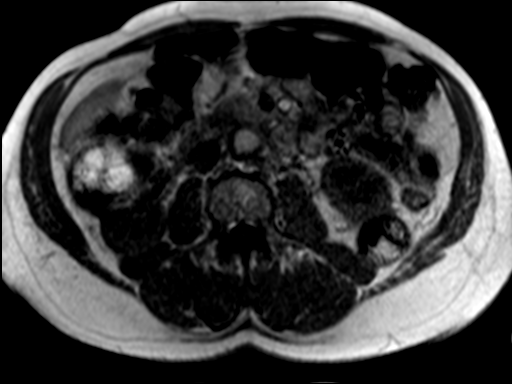
[im 44/66]
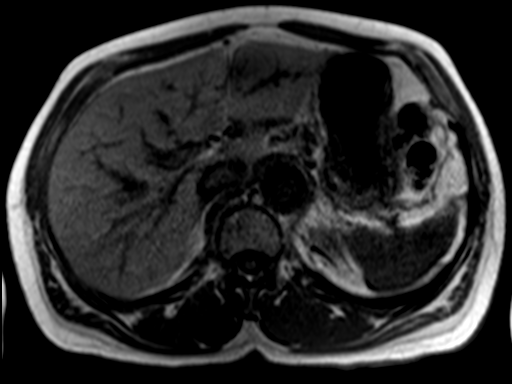
[im 66/66]
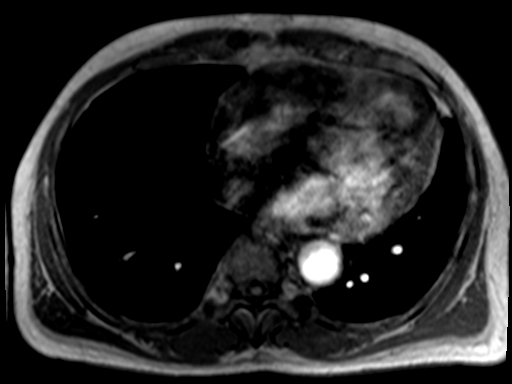

[Series 6: bSSFP · axial · 4.0mm · 0.68mm/px · z∈[-193,+47]mm · 3 of 61 slices shown]
[im 1/61]
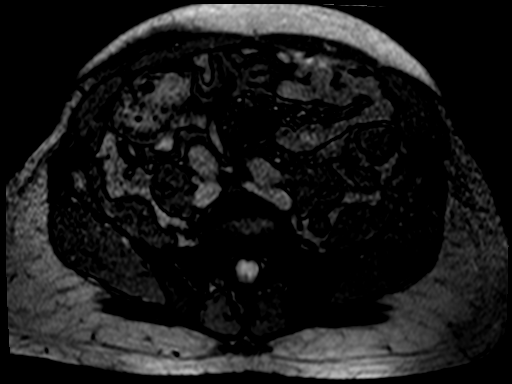
[im 31/61]
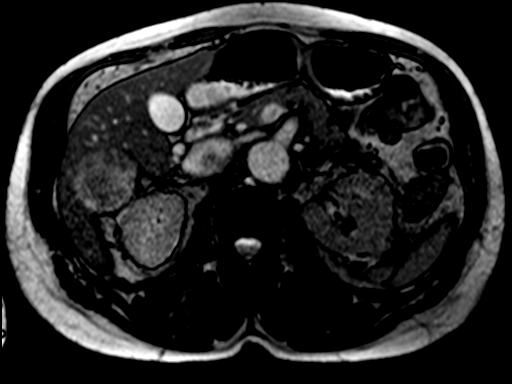
[im 61/61]
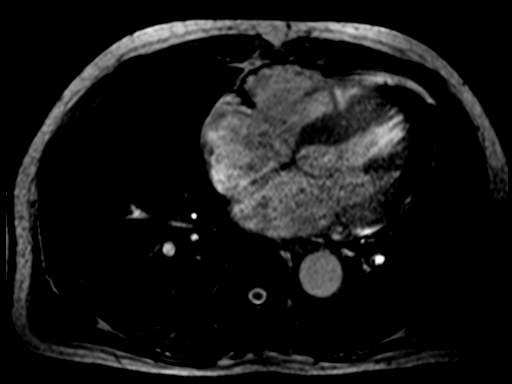

[Series 7: T2 fat-sat · axial · 6.0mm · 1.19mm/px · z∈[-160,+63]mm · 2 of 32 slices shown]
[im 1/32]
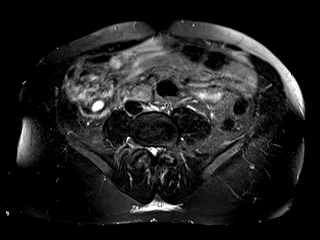
[im 32/32]
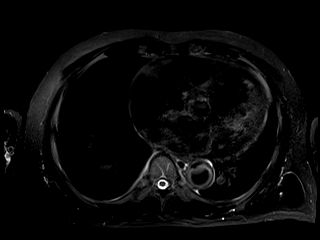

[Series 8: ep2d_diff_b50_500_800_p2_trig · axial · 6.0mm · 1.98mm/px · z∈[-160,+63]mm · 4 of 96 slices shown]
[im 1/96]
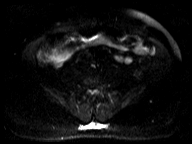
[im 32/96]
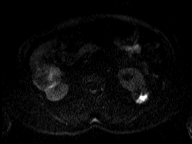
[im 64/96]
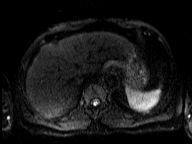
[im 96/96]
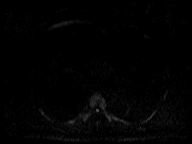

[Series 9: ep2d_diff_b50_500_800_p2_trig_adc · axial · 6.0mm · 1.98mm/px · 1 of 32 slices shown]
[im 1/32]
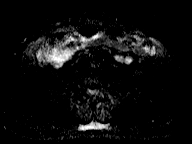

[Series 10: T1 dynamic · axial · non-contrast · 2.5mm · 0.74mm/px · z∈[-182,+35]mm · 3 of 88 slices shown]
[im 1/88]
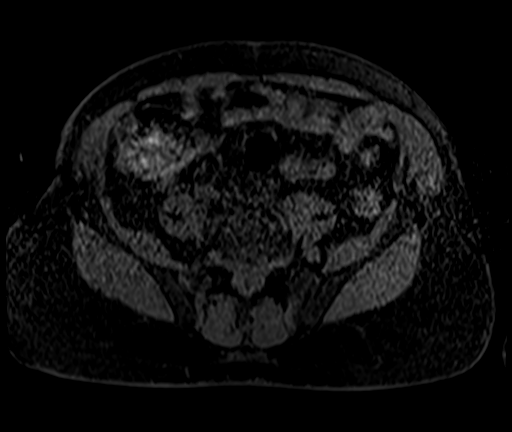
[im 44/88]
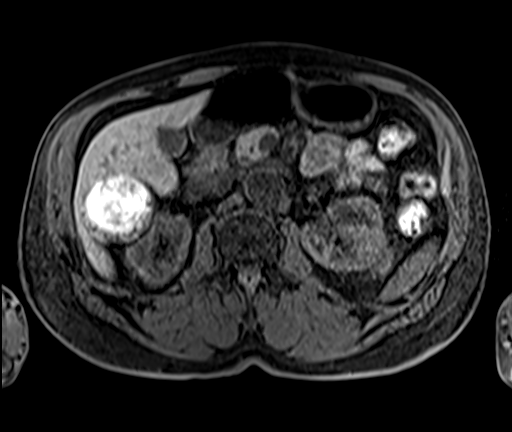
[im 88/88]
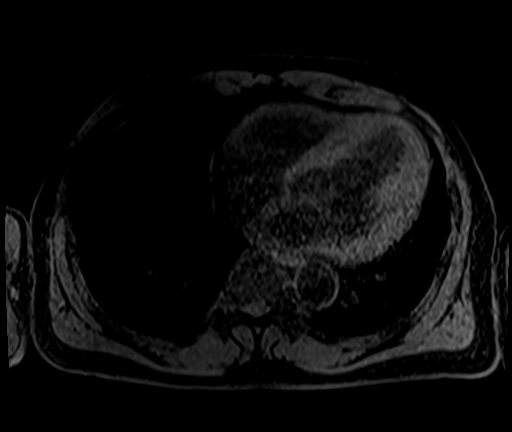

[Series 11: T1 dynamic post-contrast · axial · 2.5mm · 0.74mm/px · z∈[-182,+35]mm · 3 of 88 slices shown (1 of 2)]
[im 1/88]
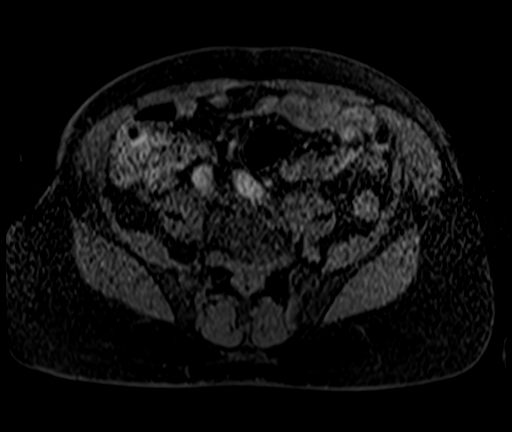
[im 44/88]
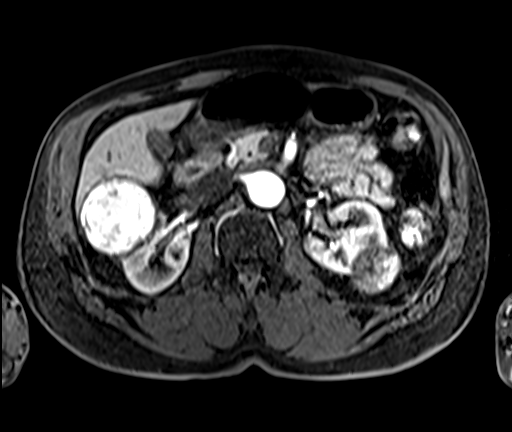
[im 88/88]
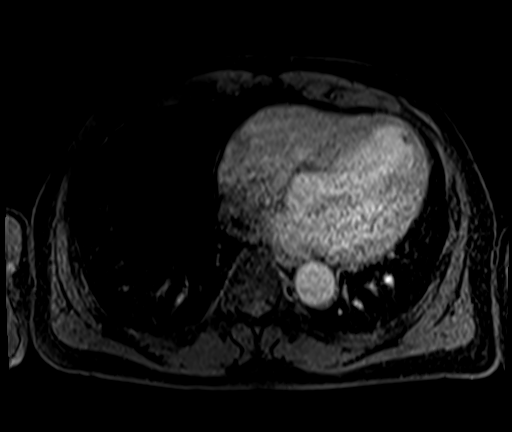

[Series 12: T1 dynamic post-contrast · axial · 2.5mm · 0.74mm/px · z∈[-182,+35]mm · 3 of 88 slices shown (2 of 2)]
[im 1/88]
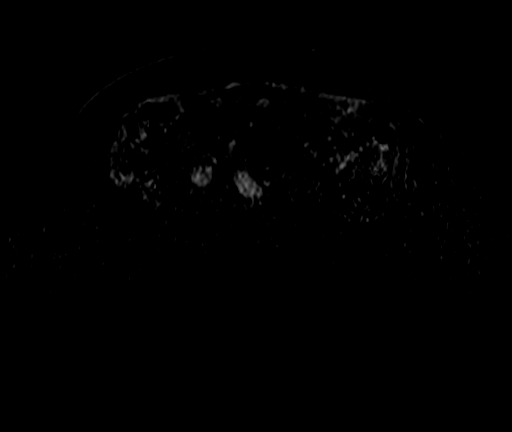
[im 44/88]
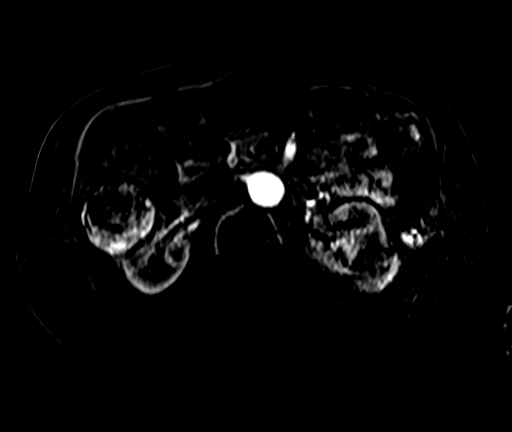
[im 88/88]
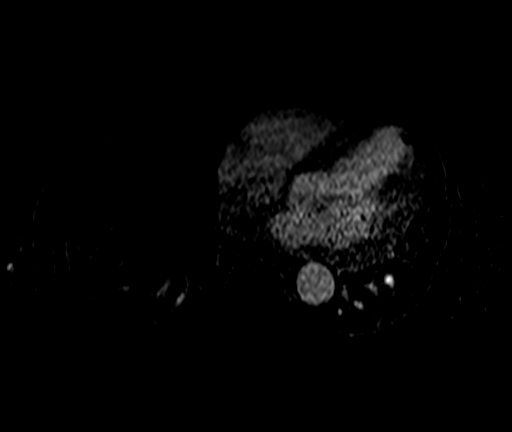

[27 of 48 positions shown; findings below may reference images not displayed]

FINDINGS: Portions of exam are mildly motion degraded.

Lower chest: Cardiomegaly. Left ventricular apical thrombus again
identified, relatively similar at 1.7 cm on [DATE].

Hepatobiliary: No suspicious liver lesion. No convincing evidence of
cirrhosis. Normal gallbladder, without biliary ductal dilatation.

Pancreas:  Normal, without mass or ductal dilatation.

Spleen:  Normal in size, without focal abnormality.

Adrenals/Urinary Tract:  Normal adrenal glands.

The most suspicious lesion is in the anterior interpolar right
kidney. This measures 2.0 x 1.9 cm on 61/14 versus 2.2 x 1.9 cm on
the prior exam, stable. This demonstrates low-level postcontrast
enhancement, including on subtracted image 64/19. Precontrast T1/T2
hypointensity.

Second most suspicious lesion is in the interpolar left kidney,
posterolaterally. Measures 3.7 x 3.8 cm on 55/10 versus 3.9 x 3.8 cm
on the prior exam. Demonstrates heterogeneous T1 hyperintensity
laterally. Mild postcontrast enhancement is favored on subtracted
image 55/19 medially.

The largest lesion is in the lateral anterior upper pole right
kidney. Measures 5.7 x 5.6 cm on [DATE] versus 5.6 x 5.5 cm on the
prior exam. Demonstrates heterogeneous T1 hyperintensity prior to
contrast on 51/10. No convincing evidence of post-contrast
enhancement, including on subtracted image 52/19.

No hydronephrosis.

Stomach/Bowel: Normal stomach and abdominal bowel loops.

Vascular/Lymphatic: Patent renal veins. Nonaneurysmal dilatation of
the infrarenal abdominal aorta at 2.8 cm. Aortic atherosclerosis. No
abdominal adenopathy.

Other:  No ascites.

Musculoskeletal: No acute osseous abnormality.
IMPRESSION: 1. Mildly motion degraded exam.
2. Anterior interpolar right renal 2.0 cm lesion is suspicious for a
papillary type renal cell carcinoma and is similar in size to the
prior exam.
3. Interpolar left renal 3.8 cm lesion is somewhat equivocal, but
also suspicious for papillary type renal cell carcinoma and is
similar in size to on the prior exam.
4. Dominant anterior upper pole right renal lesion of 5.7 cm is
favored to represent a hemorrhagic/proteinaceous cyst and is not
significantly changed in size.
5. No renal vein involvement or evidence of abdominal metastatic
disease.
6. Similar left ventricular apical thrombus.

## 2022-01-29 MED ORDER — GADOBENATE DIMEGLUMINE 529 MG/ML IV SOLN
17.0000 mL | Freq: Once | INTRAVENOUS | Status: AC | PRN
  Administered 2022-01-29: 17 mL via INTRAVENOUS

## 2022-02-04 NOTE — Progress Notes (Signed)
ADVANCED HF CLINIC  NOTE  Primary Care: Agustina Caroli, MD HF Cardiologist:  Dr. Haroldine Laws  HPI: Troy May is a 71 y.o. male w/ HTN, HLD, CAD, and BiV CHF/iCM.  Admitted 11/22 w/ CP, Influenza A+. Hypertensive on arrival requiring labetalol gtt, CT chest - PE but + for cardiomegaly, coronary CA2+, mild aneurysmal dilatation of the ascending thoracic aorta 43 mm; bilateral renal masses suspicious for RCC.  Echo 11/22 EF 25-30% w/LV apical thrombus G2DD, RV moderately HK. R/LHC severe 2V CAD w/ occluded LAD & RCA,  elevated filling pressures w/ low output, CI 1.7. No good options for revasc -> med rx. cMRI LVEF 21%, LAD/RCA territories with scar and do not appear viable, large LV thrombus. Discharge weight 172 lbs.  Echo 03/23: EF 25-30%, + apical thrombus, moderate MR/TR  Last seen 03/23. Toprol XL 25 added. Referred to EP for ICD. Saw Dr. Caryl Comes for evaluation 12/11/21. Wanted to think about ICD.  MRI abdomen 06/02: 2 cm right kidney and 3.8 cm left kidney mass suspicious for renal cell cacinoma. Sees Urology soon.   He is here today for f/u. He is accompanied by his wife. He is feeling well, no CP, dyspnea, orthopnea, PND or lower extremity edema. Weighs between 185-187 lb at home. Reports watching fluid and sodium intake. No dizziness or syncopal episodes. He ran out of all of his medications. He believes he has been off everything for a few weeks. Claudie Leach, and Jardiance are too expensive.      Cardiac Studies: - Echo (03/23): EF 25-30%, + apical thrombus, moderate MR/TR - Echo (11/22): EF 25-30%, RV moderately reduced, GIIDD - cMRI (11/22): EF 21% and minimal viability in RCA and LAD territories  - [facility] 07/27/21:   Ost RCA to Prox RCA lesion is 100% stenosed.   Prox Cx to Mid Cx lesion is 30% stenosed.   1st Mrg lesion is 20% stenosed.   Prox LAD lesion is 60% stenosed.   Mid LAD lesion is 90% stenosed.   Mid LAD to Dist LAD lesion is 100% stenosed.   Findings: Ao  = 143/88 (109)  LV = 146/25 RA =  8 RV = 41/12 PA = 45/20 (29) PCW = 21 Fick cardiac output/index = 3.4/1.7 PVR = 2.4 WU SVR = 2383  FA sat = 95% PA sat = 54%, 57% PAPi 3.125    Assessment: Severe 2v CAD with occluded LAD and RCA  Severe iCM EF 25% Elevated filling pressures with low output  Past Medical History:  Diagnosis Date   Ascending aortic aneurysm (HCC)    Bilateral renal masses    CAD (coronary artery disease)    HLD (hyperlipidemia)    Hypertension    LV (left ventricular) mural thrombus    Systolic heart failure (HCC)    Current Outpatient Medications  Medication Sig Dispense Refill   cetirizine (ZYRTEC) 10 MG tablet Take 10 mg by mouth daily as needed for allergies.     OVER THE COUNTER MEDICATION Take 1 tablet by mouth daily. gummy     aspirin EC 81 MG tablet Take 1 tablet (81 mg total) by mouth daily. Swallow whole. 30 tablet 11   atorvastatin (LIPITOR) 80 MG tablet Take 1 tablet (80 mg total) by mouth daily. 30 tablet 11   empagliflozin (JARDIANCE) 10 MG TABS tablet Take 1 tablet (10 mg total) by mouth daily. 30 tablet 11   metoprolol succinate (TOPROL XL) 25 MG 24 hr tablet Take 1 tablet (25 mg total)  by mouth at bedtime. 30 tablet 11   rivaroxaban (XARELTO) 20 MG TABS tablet Take 1 tablet (20 mg total) by mouth daily with supper. 30 tablet 11   sacubitril-valsartan (ENTRESTO) 49-51 MG Take 1 tablet by mouth 2 (two) times daily. 60 tablet 11   spironolactone (ALDACTONE) 25 MG tablet Take 0.5 tablets (12.5 mg total) by mouth daily. 45 tablet 1   torsemide (DEMADEX) 20 MG tablet Take 1 tablet (20 mg total) by mouth daily as needed (fluid retention). 30 tablet 6   No current facility-administered medications for this encounter.   No Known Allergies  Social History   Socioeconomic History   Marital status: Married    Spouse name: Not on file   Number of children: Not on file   Years of education: Not on file   Highest education level: Not on file   Occupational History   Not on file  Tobacco Use   Smoking status: Former    Types: Cigarettes    Quit date: 07/27/2021    Years since quitting: 0.5   Smokeless tobacco: Not on file  Substance and Sexual Activity   Alcohol use: Not on file   Drug use: Not on file   Sexual activity: Not on file  Other Topics Concern   Not on file  Social History Narrative   Not on file   Social Determinants of Health   Financial Resource Strain: Not on file  Food Insecurity: Not on file  Transportation Needs: Not on file  Physical Activity: Not on file  Stress: Not on file  Social Connections: Not on file  Intimate Partner Violence: Not on file   Family History  Problem Relation Age of Onset   Hypertension Brother    Heart failure Neg Hx    BP (!) 162/100   Pulse 69   Wt 87.2 kg (192 lb 3.2 oz)   SpO2 98%   BMI 27.19 kg/m   Wt Readings from Last 3 Encounters:  02/05/22 87.2 kg (192 lb 3.2 oz)  12/11/21 85.7 kg (189 lb)  10/30/21 85.5 kg (188 lb 6.4 oz)   PHYSICAL EXAM: General: Well appearing. Ambulated into clinic.  HEENT: normal Neck: supple. no JVD. Carotids 2+ bilat; no bruits.  Cor: PMI nondisplaced. Regular rate & rhythm. No rubs, gallops or murmurs. Lungs: clear Abdomen: soft, nontender, nondistended.  Extremities: no cyanosis, clubbing, rash, trace edema Neuro: alert & orientedx3, cranial nerves grossly intact. moves all 4 extremities w/o difficulty. Affect pleasant    ASSESSMENT & PLAN:  Chronic Biventricular Heart Failure/ Ischemic CM (new) - Echo 11/22 EF 25-30%, RV moderately reduced, G2DD - [facility] 11/22 w/ severe 2VCAD, elevated filling pressures (PCWP 21) and low output, CI 1.7  - cMRI with EF 21% and minimal viability in RCA and LAD territories. - Echo 10/30/21 EF 25-30% + apical clot. Moderate MR/TR - NYHA II, volume looks good on exam.  - We had a long discussion regarding importance of adherence with medications. Has been off all meds for at least a few  weeks. Will provide samples of Entresto and Jardiance until we can get patient assistance for him. - Restart spiro 12.5 mg daily  - Restart Entresto 49/51 mg bid. - Prescribed torsemide 20 mg PRN. Has not needed recently - Restart Jardiance 10 mg daily.  - Restart Toprol XL 25  - Has seen EP. Declines ICD - Labs today  2. CAD - Cath 11/22 severe 2V CAD, mid-distal LAD 100% stenosed, ost-prox RCA 100% stenosed  -  cMRI 11/22  EF 21% and larg  inferior and apical scars with minimal viability in the RCA/LAD territories. Large LV clot.  - No options for revascularization, continue medical therapy. - No s/s angina - Restart ASA 81 mg daily + atorvastatin 80. - ? blocker today - Referred for CR but states he has not been contacted to start program. Will follow-up.    3. LV Thrombus  - 2/2 severe LV dysfunction  - Restart Xarelto 20 mg daily, Consider cutting back to 15 mg daily if CrCl consistently < 50 ml/min - Provided samples for Xarelto, working on patient assistance   4. Bilateral Renal Masses, suspicion for RCC - Incidental finding on chest CT. Also noted on recent MRI abdomen - Not amenable to coiling per IR - Following with Urology   5. Hypertension  - BP elevated - Off all meds as above  7. Ascending Thoracic Aortic Aneurysm - 43 mm in diameter on chest CT - Asc Ao 4.0 cm on echo 03/23   [facility] In the meantime, will work on patient assistance for meds. Stressed importance of compliance.  Follow-up: 4 weeks as above  Zakyia Gagan N, PA-C  9:41 AM

## 2022-02-05 ENCOUNTER — Encounter (HOSPITAL_COMMUNITY): Payer: Self-pay

## 2022-02-05 ENCOUNTER — Ambulatory Visit (HOSPITAL_COMMUNITY)
Admission: RE | Admit: 2022-02-05 | Discharge: 2022-02-05 | Disposition: A | Discharge status: Home / Self Care | Payer: Medicare Other | Source: Ambulatory Visit | Attending: Physician Assistant | Admitting: Physician Assistant

## 2022-02-05 VITALS — BP 162/100 | HR 69 | Wt 192.2 lb

## 2022-02-05 DIAGNOSIS — I7121 Aneurysm of the ascending aorta, without rupture: Secondary | ICD-10-CM | POA: Insufficient documentation

## 2022-02-05 DIAGNOSIS — Z7984 Long term (current) use of oral hypoglycemic drugs: Secondary | ICD-10-CM | POA: Diagnosis not present

## 2022-02-05 DIAGNOSIS — N2889 Other specified disorders of kidney and ureter: Secondary | ICD-10-CM | POA: Diagnosis not present

## 2022-02-05 DIAGNOSIS — Z7901 Long term (current) use of anticoagulants: Secondary | ICD-10-CM | POA: Diagnosis not present

## 2022-02-05 DIAGNOSIS — Z79899 Other long term (current) drug therapy: Secondary | ICD-10-CM | POA: Insufficient documentation

## 2022-02-05 DIAGNOSIS — I110 Hypertensive heart disease with heart failure: Secondary | ICD-10-CM | POA: Insufficient documentation

## 2022-02-05 DIAGNOSIS — I255 Ischemic cardiomyopathy: Secondary | ICD-10-CM | POA: Insufficient documentation

## 2022-02-05 DIAGNOSIS — I513 Intracardiac thrombosis, not elsewhere classified: Secondary | ICD-10-CM

## 2022-02-05 DIAGNOSIS — I10 Essential (primary) hypertension: Secondary | ICD-10-CM

## 2022-02-05 DIAGNOSIS — I2510 Atherosclerotic heart disease of native coronary artery without angina pectoris: Secondary | ICD-10-CM | POA: Insufficient documentation

## 2022-02-05 DIAGNOSIS — I5082 Biventricular heart failure: Secondary | ICD-10-CM | POA: Insufficient documentation

## 2022-02-05 DIAGNOSIS — E785 Hyperlipidemia, unspecified: Secondary | ICD-10-CM | POA: Insufficient documentation

## 2022-02-05 LAB — BASIC METABOLIC PANEL
Anion gap: 7 (ref 5–15)
BUN: 17 mg/dL (ref 8–23)
CO2: 27 mmol/L (ref 22–32)
Calcium: 8.8 mg/dL — ABNORMAL LOW (ref 8.9–10.3)
Chloride: 102 mmol/L (ref 98–111)
Creatinine, Ser: 1.14 mg/dL (ref 0.61–1.24)
GFR, Estimated: >60 mL/min (ref >60)
Glucose, Bld: 92 mg/dL (ref 70–99)
Potassium: 4.1 mmol/L (ref 3.5–5.1)
Sodium: 136 mmol/L (ref 135–145)

## 2022-02-05 LAB — BRAIN NATRIURETIC PEPTIDE: B Natriuretic Peptide: 914.3 pg/mL — ABNORMAL HIGH (ref 0.0–100.0)

## 2022-02-05 MED ORDER — EMPAGLIFLOZIN 10 MG PO TABS: 10.0000 mg | ORAL_TABLET | Freq: Every day | ORAL | 11 refills | Status: DC

## 2022-02-05 MED ORDER — SACUBITRIL-VALSARTAN 49-51 MG PO TABS: 1.0000 | ORAL_TABLET | Freq: Two times a day (BID) | ORAL | 11 refills | Status: DC

## 2022-02-05 MED ORDER — ASPIRIN 81 MG PO TBEC: 81.0000 mg | DELAYED_RELEASE_TABLET | Freq: Every day | ORAL | 11 refills | Status: DC

## 2022-02-05 MED ORDER — ATORVASTATIN CALCIUM 80 MG PO TABS: 80.0000 mg | ORAL_TABLET | Freq: Every day | ORAL | 11 refills | Status: DC

## 2022-02-05 MED ORDER — METOPROLOL SUCCINATE ER 25 MG PO TB24: 25.0000 mg | ORAL_TABLET | Freq: Every day | ORAL | 11 refills | Status: DC

## 2022-02-05 MED ORDER — TORSEMIDE 20 MG PO TABS: 20.0000 mg | ORAL_TABLET | Freq: Every day | ORAL | 6 refills | Status: DC | PRN

## 2022-02-05 MED ORDER — RIVAROXABAN 20 MG PO TABS: 20.0000 mg | ORAL_TABLET | Freq: Every day | ORAL | 11 refills | Status: DC

## 2022-02-05 MED ORDER — SPIRONOLACTONE 25 MG PO TABS: 12.5000 mg | ORAL_TABLET | Freq: Every day | ORAL | 1 refills | Status: DC

## 2022-02-05 NOTE — Progress Notes (Signed)
Medication Samples have been provided to the patient.  Drug name: xarelto       Strength: 20 mg        Qty: 28  LOT: [id number]  Exp.Date: 05/2023  Dosing instructions: ONE TAB DAILY AT [facility]  The patient has been instructed regarding the correct time, dose, and frequency of taking this medication, including desired effects and most common side effects.   Medication Samples have been provided to the patient.  Drug name: JARDIANCE       Strength: 10 MG        Qty: 28  LOT: [id number]  Exp.Date: 12/2023  Dosing instructions: ONE TB DAILY  The patient has been instructed regarding the correct time, dose, and frequency of taking this medication, including desired effects and most common side effects.   Medication Samples have been provided to the patient.  Drug name: ENTRESTO       Strength: 49/51 MG        Qty: 56  LOT: Shylo.Downs  Exp.Date: 07/2023  Dosing instructions: ONE TAB TWICE A DAY  The patient has been instructed regarding the correct time, dose, and frequency of taking this medication, including desired effects and most common side effects.   Kerry Dory 9:52 AM 02/05/2022

## 2022-02-05 NOTE — Patient Instructions (Addendum)
It was great to see you today! No medication changes are needed at this time. -all your medications have been refilled to your pharmacy on file -in order to proceed with any patient assistance, please contact Kathlee Nations with income and [place] size  Labs today We will only contact you if something comes back abnormal or we need to make some changes. Otherwise no news is good news!  You have been referred to [facility] -they will be in contact to arrange orientation   Your physician recommends that you schedule a follow-up appointment in: 4 weeks  in the Advanced Practitioners (PA/NP) Clinic     Do the following things EVERYDAY: Weigh yourself in the morning before breakfast. Write it down and keep it in a log. Take your medicines as prescribed Eat low salt foods--Limit salt (sodium) to 2000 mg per day.  Stay as active as you can everyday Limit all fluids for the day to less than 2 liters  At the [facility], you and your health needs are our priority. As part of our continuing mission to provide you with exceptional heart care, we have created designated Provider Care Teams. These Care Teams include your primary Cardiologist (physician) and Advanced Practice Providers (APPs- Physician Assistants and Nurse Practitioners) who all work together to provide you with the care you need, when you need it.   You may see any of the following providers on your designated Care Team at your next follow up: Dr Glori Bickers Dr Haynes Kerns, NP Lyda Jester, [place] [facility] [place], [place] Audry Riles, PharmD   Please be sure to bring in all your medications bottles to every appointment.

## 2022-02-08 ENCOUNTER — Ambulatory Visit: Payer: Medicare Other | Admitting: Emergency Medicine

## 2022-02-08 ENCOUNTER — Encounter: Payer: Self-pay | Admitting: Emergency Medicine

## 2022-02-08 VITALS — BP 130/70 | HR 67 | Temp 97.9°F | Ht 70.5 in | Wt 189.0 lb

## 2022-02-08 DIAGNOSIS — I5022 Chronic systolic (congestive) heart failure: Secondary | ICD-10-CM

## 2022-02-08 DIAGNOSIS — I10 Essential (primary) hypertension: Secondary | ICD-10-CM

## 2022-02-08 DIAGNOSIS — I25118 Atherosclerotic heart disease of native coronary artery with other forms of angina pectoris: Secondary | ICD-10-CM | POA: Insufficient documentation

## 2022-02-08 DIAGNOSIS — I513 Intracardiac thrombosis, not elsewhere classified: Secondary | ICD-10-CM | POA: Diagnosis not present

## 2022-02-08 DIAGNOSIS — Z7689 Persons encountering health services in other specified circumstances: Secondary | ICD-10-CM

## 2022-02-08 NOTE — Assessment & Plan Note (Signed)
Severe two-vessel coronary artery disease.  No options for revascularization, continue medical therapy No anginal symptoms Continue baby aspirin plus atorvastatin 80 mg daily Continue beta-blocker metoprolol succinate 25 mg daily.

## 2022-02-08 NOTE — Assessment & Plan Note (Signed)
Severe left ventricular dysfunction. Continue Xarelto 20 mg daily

## 2022-02-08 NOTE — Progress Notes (Signed)
T[place] May 71 y.o.   Chief Complaint  Patient presents with   New Patient (Initial Visit)    HISTORY OF PRESENT ILLNESS: This is a 71 y.o. male first visit to this office, here to establish care with me. Has history of congestive heart failure, hypertension, coronary artery disease and dyslipidemia.  On multiple medications. Has no complaints or any other medical concerns today. 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 - Echo 10/30/21 EF 25-30% + apical clot. Moderate MR/TR - NYHA II, volume looks good on exam.  - We had a long discussion regarding importance of adherence with medications. Has been off all meds for at least a few weeks. Will provide samples of Entresto and Jardiance until we can get patient assistance for him. - Restart spiro 12.5 mg daily  - Restart Entresto 49/51 mg bid. - Prescribed torsemide 20 mg PRN. Has not needed recently - Restart Jardiance 10 mg daily.  - Restart Toprol XL 25  - Has seen EP. Declines ICD - Labs today   2. CAD - Cath 11/22 severe 2V CAD, mid-distal LAD 100% stenosed, ost-prox RCA 100% stenosed  - cMRI 11/22  EF 21% and larg  inferior and apical scars with minimal viability in the RCA/LAD territories. Large LV clot.  - No options for revascularization, continue medical therapy. - No s/s angina - Restart ASA 81 mg daily + atorvastatin 80. - ? blocker today - Referred for CR but states he has not been contacted to start program. Will follow-up.    3. LV Thrombus  - 2/2 severe LV dysfunction  - Restart Xarelto 20 mg daily, Consider cutting back to 15 mg daily if CrCl consistently < 50 ml/min - Provided samples for Xarelto, working on  patient assistance   4. Bilateral Renal Masses, suspicion for RCC - Incidental finding on chest CT. Also noted on recent MRI abdomen - Not amenable to coiling per IR - Following with Urology   5. Hypertension  - BP elevated - Off all meds as above   7. Ascending Thoracic Aortic Aneurysm - 43 mm in diameter on chest CT - Asc Ao 4.0 cm on echo 03/23     [facility] In the meantime, will work on patient assistance for meds. Stressed importance of compliance.   Follow-up: 4 weeks as above   FINCH, LINDSAY N, PA-C  9:41 AM  HPI   Prior to Admission medications   Medication Sig Start Date End Date Taking? Authorizing Provider  aspirin EC 81 MG tablet Take 1 tablet (81 mg total) by mouth daily. Swallow whole. 02/05/22  Yes Joette Catching, PA-C  atorvastatin (LIPITOR) 80 MG tablet Take 1 tablet (80 mg total) by mouth daily. 02/05/22  Yes Joette Catching, PA-C  cetirizine (ZYRTEC) 10 MG tablet Take 10 mg by mouth daily as needed for allergies.   Yes [provider]  empagliflozin (JARDIANCE) 10 MG TABS tablet Take 1 tablet (10 mg total) by mouth daily. 02/05/22  Yes Joette Catching, PA-C  metoprolol succinate (TOPROL XL) 25 MG 24 hr tablet Take 1 tablet (25 mg total) by mouth at bedtime. 02/05/22  Yes Joette Catching, PA-C  OVER  THE COUNTER MEDICATION Take 1 tablet by mouth daily. gummy   Yes [provider]  rivaroxaban (XARELTO) 20 MG TABS tablet Take 1 tablet (20 mg total) by mouth daily with supper. 02/05/22  Yes Joette Catching, PA-C  sacubitril-valsartan (ENTRESTO) 49-51 MG Take 1 tablet by mouth 2 (two) times daily. 02/05/22  Yes Joette Catching, PA-C  spironolactone (ALDACTONE) 25 MG tablet Take 0.5 tablets (12.5 mg total) by mouth daily. 02/05/22  Yes Joette Catching, PA-C  torsemide (DEMADEX) 20 MG tablet Take 1 tablet (20 mg total) by mouth daily as needed (fluid retention). 02/05/22  Yes Joette Catching,  PA-C    No Known Allergies  Patient Active Problem List   Diagnosis Date Noted   Chronic systolic heart failure ([place]) 08/05/2021   NSTEMI (non-ST elevated myocardial infarction) ([place]) 07/25/2021   Influenza A 07/25/2021   Essential hypertension 07/25/2021   AKI (acute kidney injury) ([place]) 07/25/2021   Elevated troponin 07/25/2021   Acute combined systolic and diastolic heart failure ([place])    Left ventricular apical thrombus     Past Medical History:  Diagnosis Date   Ascending aortic aneurysm (HCC)    Bilateral renal masses    CAD (coronary artery disease)    HLD (hyperlipidemia)    Hypertension    LV (left ventricular) mural thrombus    Systolic heart failure ([place])     Past Surgical History:  Procedure Laterality Date   RIGHT/LEFT HEART CATH AND CORONARY ANGIOGRAPHY N/A 07/27/2021   Procedure: RIGHT/LEFT HEART CATH AND CORONARY ANGIOGRAPHY;  Surgeon: Jolaine Artist, MD;  Location: [facility];  Service: Cardiovascular;  Laterality: N/A;    Social History   Socioeconomic History   Marital status: Married    Spouse name: Not on file   Number of children: Not on file   Years of education: Not on file   Highest education level: Not on file  Occupational History   Not on file  Tobacco Use   Smoking status: Former    Types: Cigarettes    Quit date: 07/27/2021    Years since quitting: 0.5   Smokeless tobacco: Not on file  Substance and Sexual Activity   Alcohol use: Not on file   Drug use: Not on file   Sexual activity: Not on file  Other Topics Concern   Not on file  Social History Narrative   Not on file   Social Determinants of Health   Financial Resource Strain: Not on file  Food Insecurity: Not on file  Transportation Needs: Not on file  Physical Activity: Not on file  Stress: Not on file  Social Connections: Not on file  Intimate Partner Violence: Not on file    Family History  Problem Relation Age of Onset   Hypertension Brother     Heart failure Neg Hx      Review of Systems  Constitutional: Negative.  Negative for chills and fever.  HENT: Negative.  Negative for congestion and sore throat.   Respiratory: Negative.  Negative for cough and shortness of breath.   Cardiovascular: Negative.  Negative for chest pain and palpitations.  Gastrointestinal: Negative.  Negative for abdominal pain, diarrhea, nausea and vomiting.  Genitourinary: Negative.   Skin: Negative.  Negative for rash.  Neurological:  Negative for dizziness and headaches.  All other systems reviewed and are negative.   Today's Vitals   02/08/22 1008  BP: (!) 152/88  Pulse: 67  Temp: 97.9 F (36.6 C)  TempSrc: Oral  SpO2: 95%  Weight: 189 lb (85.7 kg)  Height: 5' 10.5" (1.791 m)   Body mass index is 26.74 kg/m. Wt Readings from Last 3 Encounters:  02/08/22 189 lb (85.7 kg)  02/05/22 192 lb 3.2 oz (87.2 kg)  12/11/21 189 lb (85.7 kg)    Physical Exam Vitals reviewed.  Constitutional:      Appearance: Normal appearance.  HENT:     Head: Normocephalic.     Right Ear: Tympanic membrane, ear canal and external ear normal.     Left Ear: Tympanic membrane, ear canal and external ear normal.     Mouth/Throat:     Mouth: Mucous membranes are moist.     Pharynx: Oropharynx is clear.  Eyes:     Extraocular Movements: Extraocular movements intact.     Conjunctiva/sclera: Conjunctivae normal.     Pupils: Pupils are equal, round, and reactive to light.  Cardiovascular:     Rate and Rhythm: Normal rate and regular rhythm.     Pulses: Normal pulses.     Heart sounds: Normal heart sounds.  Pulmonary:     Effort: Pulmonary effort is normal.     Breath sounds: Normal breath sounds.  Abdominal:     Palpations: Abdomen is soft.     Tenderness: There is no abdominal tenderness.  Musculoskeletal:     Cervical back: No tenderness.     Right lower leg: No edema.     Left lower leg: No edema.  Lymphadenopathy:     Cervical: No cervical  adenopathy.  Skin:    General: Skin is warm and dry.     Capillary Refill: Capillary refill takes less than 2 seconds.  Neurological:     General: No focal deficit present.     Mental Status: He is alert and oriented to person, place, and time.  Psychiatric:        Mood and Affect: Mood normal.        Behavior: Behavior normal.      ASSESSMENT & PLAN: A total of 63 minutes was spent with the patient and counseling/coordination of care regarding preparing for this visit and establishing care with me, review of available medical records including most recent cardiologist office visit assessment and plan, review of multiple chronic medical problems and their management, review of all medications, education on nutrition, prognosis, documentation and need for follow-up.  Problem List Items Addressed This Visit       Cardiovascular and Mediastinum   Essential hypertension    Well-controlled hypertension. Continue present medications.  No changes.      Left ventricular apical thrombus    Severe left ventricular dysfunction. Continue Xarelto 20 mg daily      Chronic systolic heart failure ([place]) - Primary    Clinically euvolemic.  Follows up with cardiologist on a regular basis. Wt Readings from Last 3 Encounters:  02/08/22 189 lb (85.7 kg)  02/05/22 192 lb 3.2 oz (87.2 kg)  12/11/21 189 lb (85.7 kg)  Continue spironolactone 12.5 mg daily Continue Entresto 49/51 mg twice daily Take torsemide 20 mg as needed Continue Jardiance 10 mg daily Continue Toprol XL 25 mg daily       Coronary artery disease of native artery of native heart with stable angina pectoris (HCC)    Severe two-vessel coronary artery disease.  No options for revascularization, continue medical therapy No anginal symptoms Continue baby aspirin plus atorvastatin 80 mg daily Continue beta-blocker metoprolol succinate 25 mg daily.  Other Visit Diagnoses     Encounter to establish care          Patient  Instructions  Heart Failure, Diagnosis  Heart failure means that your heart is not able to pump blood in the right way. This makes it hard for your body to work well. Heart failure is usually a long-term (chronic) condition. You must take good care of yourself and follow your treatment plan from your doctor. Different stages of heart failure have different treatment plans. The stages are: Stage A: At risk for heart failure. Stage B: Pre-heart failure. Stage C: Symptomatic heart failure. Stage D: Advanced heart failure. What are the causes? High blood pressure. Buildup of cholesterol and fat in the arteries. Heart attack. This injures the heart muscle. Heart valves that do not open and close properly. Damage of the heart muscle. This is also called cardiomyopathy. Infection of the heart muscle. This is also called myocarditis. Lung disease. What increases the risk? Getting older. The risk of heart failure goes up as a person ages. Being overweight. Using tobacco or nicotine products. Abusing alcohol or drugs. Having taken medicines that can damage the heart. Having any of these conditions: Diabetes. Abnormal heart rhythms. Thyroid problems. Low blood counts (anemia). Having a family history of heart failure. What are the signs or symptoms? Shortness of breath. Coughing. Swelling of the feet, ankles, legs, or belly. Losing or gaining weight for no reason. Trouble breathing. Waking from sleep because of the need to sit up and get more air. Fast heartbeat. Other symptoms may include: Being very tired. Feeling dizzy, or feeling like you may pass out (faint). Having no desire to eat. Feeling like you may vomit (nauseous). Peeing (urinating) more at night. Feeling confused. How is this treated? This condition may be treated with: Medicines. These can be given to treat blood pressure and to make the heart muscles stronger. Changes in your daily life. These may include: Eating  a healthy diet. Staying at a healthy body weight. Quitting tobacco, alcohol, and drug use. Doing exercises. Participating in a cardiac rehabilitation program. This program helps you improve your health through exercise, education, and counseling. Surgery. Surgery can be done to open blocked valves or to put devices in the heart, such as pacemakers. A donor heart (heart transplant). You will receive a healthy heart from a donor. Follow these instructions at home: Treat other conditions as told by your doctor. These may include high blood pressure, diabetes, thyroid disease, or abnormal heart rhythms. Learn as much as you can about heart failure. Get support as you need it. Keep all follow-up visits. Where to find more information American Heart Association: www.heart.org Centers for Disease Control and Prevention: http://www.wolf.info/ National Institute on Aging: http://kim-miller.com/ Summary Heart failure means that your heart is not able to pump blood in the right way. This condition is often caused by high blood pressure, heart attack, or damage of the heart muscle. Symptoms of this condition include shortness of breath and swelling of the feet, ankles, legs, or belly. You may also feel very tired or feel like you may vomit. You may be treated with medicines, surgery, or changes in your daily life. Treat other health conditions as told by your doctor. This information is not intended to replace advice given to you by your health care provider. Make sure you discuss any questions you have with your health care provider. Document Revised: 02/12/2021 Document Reviewed: 03/08/2020 Elsevier Patient Education  [place]  Mitchel Honour, MD [facility]

## 2022-02-08 NOTE — Assessment & Plan Note (Signed)
Well-controlled hypertension. Continue present medications.  No changes.

## 2022-02-08 NOTE — Patient Instructions (Signed)
Heart Failure, Diagnosis  Heart failure means that your heart is not able to pump blood in the right way. This makes it hard for your body to work well. Heart failure is usually a long-term (chronic) condition. You must take good care of yourself and follow your treatment plan from your doctor. Different stages of heart failure have different treatment plans. The stages are: Stage A: At risk for heart failure. Stage B: Pre-heart failure. Stage C: Symptomatic heart failure. Stage D: Advanced heart failure. What are the causes? High blood pressure. Buildup of cholesterol and fat in the arteries. Heart attack. This injures the heart muscle. Heart valves that do not open and close properly. Damage of the heart muscle. This is also called cardiomyopathy. Infection of the heart muscle. This is also called myocarditis. Lung disease. What increases the risk? Getting older. The risk of heart failure goes up as a person ages. Being overweight. Using tobacco or nicotine products. Abusing alcohol or drugs. Having taken medicines that can damage the heart. Having any of these conditions: Diabetes. Abnormal heart rhythms. Thyroid problems. Low blood counts (anemia). Having a family history of heart failure. What are the signs or symptoms? Shortness of breath. Coughing. Swelling of the feet, ankles, legs, or belly. Losing or gaining weight for no reason. Trouble breathing. Waking from sleep because of the need to sit up and get more air. Fast heartbeat. Other symptoms may include: Being very tired. Feeling dizzy, or feeling like you may pass out (faint). Having no desire to eat. Feeling like you may vomit (nauseous). Peeing (urinating) more at night. Feeling confused. How is this treated? This condition may be treated with: Medicines. These can be given to treat blood pressure and to make the heart muscles stronger. Changes in your daily life. These may include: Eating a healthy  diet. Staying at a healthy body weight. Quitting tobacco, alcohol, and drug use. Doing exercises. Participating in a cardiac rehabilitation program. This program helps you improve your health through exercise, education, and counseling. Surgery. Surgery can be done to open blocked valves or to put devices in the heart, such as pacemakers. A donor heart (heart transplant). You will receive a healthy heart from a donor. Follow these instructions at home: Treat other conditions as told by your doctor. These may include high blood pressure, diabetes, thyroid disease, or abnormal heart rhythms. Learn as much as you can about heart failure. Get support as you need it. Keep all follow-up visits. Where to find more information American Heart Association: www.heart.org Centers for Disease Control and Prevention: www.cdc.gov National Institute on Aging: www.nia.nih.gov Summary Heart failure means that your heart is not able to pump blood in the right way. This condition is often caused by high blood pressure, heart attack, or damage of the heart muscle. Symptoms of this condition include shortness of breath and swelling of the feet, ankles, legs, or belly. You may also feel very tired or feel like you may vomit. You may be treated with medicines, surgery, or changes in your daily life. Treat other health conditions as told by your doctor. This information is not intended to replace advice given to you by your health care provider. Make sure you discuss any questions you have with your health care provider. Document Revised: 02/12/2021 Document Reviewed: 03/08/2020 Elsevier Patient Education  2023 Elsevier Inc.  

## 2022-02-08 NOTE — Assessment & Plan Note (Signed)
Clinically euvolemic.  Follows up with cardiologist on a regular basis. Wt Readings from Last 3 Encounters:  02/08/22 189 lb (85.7 kg)  02/05/22 192 lb 3.2 oz (87.2 kg)  12/11/21 189 lb (85.7 kg)  Continue spironolactone 12.5 mg daily Continue Entresto 49/51 mg twice daily Take torsemide 20 mg as needed Continue Jardiance 10 mg daily Continue Toprol XL 25 mg daily

## 2022-02-15 NOTE — Telephone Encounter (Signed)
Advanced Heart Failure Patient Advocate Encounter  The patient was approved for a [employer] grant that will help cover the cost of Entresto, Jardiance. Total amount awarded, $10,000. Eligibility, 01/16/22 - 01/16/23.  ID [id number]  BIN [id number]  PCN [id number]  Group [id number]  Of note, the patient is over the income for Xarelto assistance based off the information provided.

## 2022-03-03 NOTE — Progress Notes (Signed)
ADVANCED [facility] CLINIC  NOTE  Primary Care: Agustina Caroli, MD HF Cardiologist:  Dr. Haroldine Laws  HPI: Troy May is a 71 y.o. male w/ HTN, HLD, CAD, and BiV CHF/iCM.  Admitted 11/22 w/ CP, Influenza A+. Hypertensive on arrival requiring labetalol gtt, CT chest - PE but + for cardiomegaly, coronary CA2+, mild aneurysmal dilatation of the ascending thoracic aorta 43 mm; bilateral renal masses suspicious for RCC.  Echo 11/22 EF 25-30% w/LV apical thrombus G2DD, RV moderately HK. R/LHC severe 2V CAD w/ occluded LAD & RCA,  elevated filling pressures w/ low output, CI 1.7. No good options for revasc -> med rx. cMRI LVEF 21%, LAD/RCA territories with scar and do not appear viable, large LV thrombus. Discharge weight 172 lbs.  Echo 03/23: EF 25-30%, + apical thrombus, moderate MR/TR  Follow up 3/23. Toprol XL 25 added. Referred to EP for ICD. Saw Dr. Caryl Comes for evaluation 12/11/21. Wanted to think about ICD.  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 Sees Urology soon.   Follow up 6/23, off all meds for a few weeks. GDMT restarted. Declined ICD.  Today he returns for HF follow up with his wife. Overall feeling fine. He is not SOB with activity. Denies abnormal bleeding, palpitations, CP, dizziness, edema, or PND/Orthopnea. Appetite ok. No fever or chills. Weight at home 180 pounds. Taking all medications. Has not needed torsemide recently.   Cardiac Studies: - Echo (3/23): EF 25-30%, + apical thrombus, moderate MR/TR  - Echo (11/22): EF 25-30%, RV moderately reduced, GIIDD  - cMRI (11/22): EF 21% and minimal viability in RCA and LAD territories  - [facility] 07/27/21:   Ost RCA to Prox RCA lesion is 100% stenosed.   Prox Cx to Mid Cx lesion is 30% stenosed.   1st Mrg lesion is 20% stenosed.   Prox LAD lesion is 60% stenosed.   Mid LAD lesion is 90% stenosed.   Mid LAD to Dist LAD lesion is 100% stenosed.   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 Swallow whole. 30 tablet 11   atorvastatin (LIPITOR) 80 MG tablet Take 1 tablet (80 mg total) by mouth daily. 30 tablet 11   cetirizine (ZYRTEC) 10 MG tablet Take 10 mg by mouth daily as needed for allergies.     empagliflozin (JARDIANCE) 10 MG TABS tablet Take 1 tablet (10 mg total) by mouth daily. 30 tablet 11   metoprolol succinate (TOPROL XL) 25 MG 24 hr tablet Take 1 tablet (25 mg total) by mouth at bedtime. 30 tablet 11   OVER THE COUNTER MEDICATION Take 1 tablet by mouth daily. gummy  rivaroxaban (XARELTO) 20 MG TABS tablet Take 1 tablet (20 mg total) by mouth daily with supper. 30 tablet 11   sacubitril-valsartan (ENTRESTO) 97-103 MG Take 1 tablet by mouth 2 (two) times daily. 180 tablet 3   spironolactone (ALDACTONE) 25 MG tablet Take 0.5 tablets (12.5 mg total) by mouth daily. 45 tablet 1   torsemide (DEMADEX) 20 MG tablet Take 1 tablet (20 mg total) by mouth daily as needed (fluid retention). 30 tablet 6   No current facility-administered medications for this encounter.   No Known Allergies  Social History   Socioeconomic History   Marital status: Married    Spouse name: Not on file   Number of children: Not on file   Years of education: Not on file   Highest education level: Not on file  Occupational  History   Not on file  Tobacco Use   Smoking status: Former    Types: Cigarettes    Quit date: 07/27/2021    Years since quitting: 0.6   Smokeless tobacco: Not on file  Substance and Sexual Activity   Alcohol use: Not on file   Drug use: Not on file   Sexual activity: Not on file  Other Topics Concern   Not on file  Social History Narrative   Not on file   Social Determinants of Health   Financial Resource Strain: Not on file  Food Insecurity: Not on file  Transportation Needs: Not on file  Physical Activity: Not on file  Stress: Not on file  Social Connections: Not on file  Intimate Partner Violence: Not on file   Family History  Problem Relation Age of Onset   Hypertension Brother    Heart failure Neg Hx    BP (!) 158/98 (BP Location: Right Arm, Patient Position: Sitting)   Pulse 67   Wt 85.1 kg (187 lb 9.6 oz)   SpO2 97%   BMI 26.54 kg/m   Wt Readings from Last 3 Encounters:  03/05/22 85.1 kg (187 lb 9.6 oz)  02/08/22 85.7 kg (189 lb)  02/05/22 87.2 kg (192 lb 3.2 oz)   PHYSICAL EXAM: General:  NAD. No resp difficulty HEENT: Normal Neck: Supple. No JVD. Carotids 2+ bilat; no bruits. No lymphadenopathy or thryomegaly appreciated. Cor: PMI nondisplaced. Regular rate & rhythm. No rubs, gallops or murmurs. Lungs: Clear Abdomen: Soft, nontender, nondistended. No hepatosplenomegaly. No bruits or masses. Good bowel sounds. Extremities: No cyanosis, clubbing, rash, edema Neuro: Alert & oriented x 3, cranial nerves grossly intact. Moves all 4 extremities w/o difficulty. Affect pleasant.  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 - Echo 10/30/21 EF 25-30% + apical clot. Moderate MR/TR. - NYHA II, volume looks good on exam.   - Increase Entresto to 97/103 mg bid. - Continue spiro 12.5 mg daily.  - Continue torsemide 20 mg PRN. Has not needed recently. - Continue Jardiance 10 mg daily.  - Continue Toprol XL 25 mg q hs. - Has seen EP. Declines ICD. - BMET, BNP today; repeat BMET in 10-14 days. - Doing better with medication compliance.  2. CAD - Cath 11/22 severe 2V CAD, mid-distal LAD 100% stenosed, ost-prox RCA 100% stenosed  - cMRI 11/22  EF 21% and larg  inferior and apical scars with  minimal viability in the RCA/LAD territories. Large LV clot.  - No options for revascularization, continue medical therapy. - No s/s angina - Continue ASA 81 mg daily + atorvastatin 80 + ? blocker. - Referred for CR but states he has not been contacted to start program. Will follow-up.    3. LV Thrombus  - 2/2 severe LV dysfunction  - Continue Xarelto 20 mg daily, Consider cutting back to 15 mg daily if CrCl consistently < 50 ml/min (current CrCl 71 ml/min)   4. Bilateral Renal Masses, suspicion for RCC - Incidental finding on chest CT. Also noted on recent MRI abdomen - Not amenable to coiling per IR. - Following with Urology.   5. Hypertension  - BP elevated. Check BP at home and log. - Increase GDMT as above.  6. Ascending Thoracic Aortic Aneurysm - 43 mm in diameter on chest CT - Asc Ao 4.0 cm on echo 03/23.  [facility] In the meantime, he has patient assistance for meds Delene Loll and [place]).   Follow-up in 4 weeks with PharmD (increase spiro and/or beta blocker if able) and 3-4 months with Dr. Haroldine Laws.  Rafael Bihari, FNP  10:02 AM

## 2022-03-05 ENCOUNTER — Ambulatory Visit (HOSPITAL_COMMUNITY)
Admission: RE | Admit: 2022-03-05 | Discharge: 2022-03-05 | Disposition: A | Discharge status: Home / Self Care | Payer: Medicare Other | Source: Ambulatory Visit | Attending: Family Medicine | Admitting: Family Medicine

## 2022-03-05 ENCOUNTER — Encounter (HOSPITAL_COMMUNITY): Payer: Self-pay

## 2022-03-05 VITALS — BP 158/98 | HR 67 | Wt 187.6 lb

## 2022-03-05 DIAGNOSIS — I110 Hypertensive heart disease with heart failure: Secondary | ICD-10-CM | POA: Insufficient documentation

## 2022-03-05 DIAGNOSIS — Z7901 Long term (current) use of anticoagulants: Secondary | ICD-10-CM | POA: Diagnosis not present

## 2022-03-05 DIAGNOSIS — I5022 Chronic systolic (congestive) heart failure: Secondary | ICD-10-CM

## 2022-03-05 DIAGNOSIS — I513 Intracardiac thrombosis, not elsewhere classified: Secondary | ICD-10-CM | POA: Diagnosis not present

## 2022-03-05 DIAGNOSIS — N2889 Other specified disorders of kidney and ureter: Secondary | ICD-10-CM | POA: Diagnosis not present

## 2022-03-05 DIAGNOSIS — E785 Hyperlipidemia, unspecified: Secondary | ICD-10-CM | POA: Insufficient documentation

## 2022-03-05 DIAGNOSIS — I5082 Biventricular heart failure: Secondary | ICD-10-CM | POA: Insufficient documentation

## 2022-03-05 DIAGNOSIS — I2510 Atherosclerotic heart disease of native coronary artery without angina pectoris: Secondary | ICD-10-CM

## 2022-03-05 DIAGNOSIS — I255 Ischemic cardiomyopathy: Secondary | ICD-10-CM | POA: Insufficient documentation

## 2022-03-05 DIAGNOSIS — Z7984 Long term (current) use of oral hypoglycemic drugs: Secondary | ICD-10-CM | POA: Insufficient documentation

## 2022-03-05 DIAGNOSIS — I10 Essential (primary) hypertension: Secondary | ICD-10-CM

## 2022-03-05 DIAGNOSIS — I7121 Aneurysm of the ascending aorta, without rupture: Secondary | ICD-10-CM | POA: Diagnosis not present

## 2022-03-05 DIAGNOSIS — Z79899 Other long term (current) drug therapy: Secondary | ICD-10-CM | POA: Insufficient documentation

## 2022-03-05 LAB — BASIC METABOLIC PANEL
Anion gap: 8 (ref 5–15)
BUN: 11 mg/dL (ref 8–23)
CO2: 22 mmol/L (ref 22–32)
Calcium: 8.4 mg/dL — ABNORMAL LOW (ref 8.9–10.3)
Chloride: 107 mmol/L (ref 98–111)
Creatinine, Ser: 1.19 mg/dL (ref 0.61–1.24)
GFR, Estimated: >60 mL/min (ref >60)
Glucose, Bld: 102 mg/dL — ABNORMAL HIGH (ref 70–99)
Potassium: 3.8 mmol/L (ref 3.5–5.1)
Sodium: 137 mmol/L (ref 135–145)

## 2022-03-05 LAB — BRAIN NATRIURETIC PEPTIDE: B Natriuretic Peptide: 521.9 pg/mL — ABNORMAL HIGH (ref 0.0–100.0)

## 2022-03-05 MED ORDER — ENTRESTO 97-103 MG PO TABS: 1.0000 | ORAL_TABLET | Freq: Two times a day (BID) | ORAL | 3 refills | Status: DC

## 2022-03-05 NOTE — Patient Instructions (Addendum)
Labs done today. We will contact you only if your labs are abnormal.  INCREASE Entresto to 97-'103mg'$  (1 tablet) by mouth 2 times daily.   No other medication changes were made. Please continue all current medications as prescribed.  Your physician recommends that you schedule a follow-up appointment in: 10 days for a lab only appointment, 4-6 weeks with our Clinic Pharmacist and 3-4 months with Dr. Haroldine Laws. Please contact our office in September to schedule a October/November appointment with Dr.Bensimhon.   If you have any questions or concerns before your next appointment please send [place] a message through [place] or call our office at [phone].    TO LEAVE A MESSAGE FOR THE NURSE SELECT OPTION 2, PLEASE LEAVE A MESSAGE INCLUDING: YOUR NAME DATE OF BIRTH CALL BACK NUMBER REASON FOR CALL**this is important as we prioritize the call backs  YOU WILL RECEIVE A CALL BACK THE SAME DAY AS LONG AS YOU CALL BEFORE 4:00 PM   Do the following things EVERYDAY: Weigh yourself in the morning before breakfast. Write it down and keep it in a log. Take your medicines as prescribed Eat low salt foods--Limit salt (sodium) to 2000 mg per day.  Stay as active as you can everyday Limit all fluids for the day to less than 2 liters   At the [facility], you and your health needs are our priority. As part of our continuing mission to provide you with exceptional heart care, we have created designated Provider Care Teams. These Care Teams include your primary Cardiologist (physician) and Advanced Practice Providers (APPs- Physician Assistants and Nurse Practitioners) who all work together to provide you with the care you need, when you need it.   You may see any of the following providers on your designated Care Team at your next follow up: Dr Glori Bickers Dr Haynes Kerns, NP Lyda Jester, [place] Audry Riles, PharmD   Please be sure to bring in all your medications  bottles to every appointment.

## 2022-03-15 ENCOUNTER — Other Ambulatory Visit (HOSPITAL_COMMUNITY): Payer: Self-pay | Admitting: *Deleted

## 2022-03-19 ENCOUNTER — Ambulatory Visit (HOSPITAL_COMMUNITY)
Admission: RE | Admit: 2022-03-19 | Discharge: 2022-03-19 | Disposition: A | Discharge status: Home / Self Care | Payer: Medicare Other | Source: Ambulatory Visit | Attending: Internal Medicine | Admitting: Internal Medicine

## 2022-03-19 ENCOUNTER — Telehealth (HOSPITAL_COMMUNITY): Payer: Self-pay | Admitting: Surgery

## 2022-03-19 DIAGNOSIS — I5022 Chronic systolic (congestive) heart failure: Secondary | ICD-10-CM | POA: Diagnosis present

## 2022-03-19 LAB — BASIC METABOLIC PANEL
Anion gap: 7 (ref 5–15)
BUN: 15 mg/dL (ref 8–23)
CO2: 21 mmol/L — ABNORMAL LOW (ref 22–32)
Calcium: 8.6 mg/dL — ABNORMAL LOW (ref 8.9–10.3)
Chloride: 112 mmol/L — ABNORMAL HIGH (ref 98–111)
Creatinine, Ser: 1.52 mg/dL — ABNORMAL HIGH (ref 0.61–1.24)
GFR, Estimated: 49 mL/min — ABNORMAL LOW (ref >60)
Glucose, Bld: 137 mg/dL — ABNORMAL HIGH (ref 70–99)
Potassium: 4.2 mmol/L (ref 3.5–5.1)
Sodium: 140 mmol/L (ref 135–145)

## 2022-03-19 MED ORDER — ENTRESTO 97-103 MG PO TABS: 1.0000 | ORAL_TABLET | Freq: Two times a day (BID) | ORAL | 3 refills | Status: DC

## 2022-03-19 NOTE — Telephone Encounter (Signed)
I attempted to reach patient and wife answered.  I reviewed results and recommendations per provider with wife.  She tells me that he is only taking Torsemide as needed currently and has not taken any recently.  I added repeat labs to upcoming pharmacy appt.  She requests that I make sure new prescription for Delene Loll is sent to pharmacy of choice and I resent that.

## 2022-03-19 NOTE — Telephone Encounter (Signed)
-----   Message from Rafael Bihari, [place] sent at 03/19/2022  2:51 PM EDT ----- Mild increase in kidney function. Please make sure he has changed torsemide to PRN only.    Will need BMET at pharmacy follow up in a couple weeks to follow

## 2022-03-30 ENCOUNTER — Encounter (HOSPITAL_COMMUNITY): Payer: Self-pay

## 2022-03-30 ENCOUNTER — Telehealth (HOSPITAL_COMMUNITY): Payer: Self-pay

## 2022-03-30 NOTE — Telephone Encounter (Signed)
Called pt to see if he is interested in the cardiac rehab program, pt wife stated that he wasn't there and that she will let him know to call back.   Mailed letter.

## 2022-03-31 ENCOUNTER — Telehealth (HOSPITAL_COMMUNITY): Payer: Self-pay

## 2022-03-31 NOTE — Telephone Encounter (Signed)
Pt called back and wanted to scheduling for cardiac rehab, pt will come in for orientation on 04/01/2022'@1'$ :15pm and will attend the 10:15 exercise class.

## 2022-04-01 ENCOUNTER — Encounter (HOSPITAL_COMMUNITY): Payer: Self-pay

## 2022-04-01 ENCOUNTER — Encounter (HOSPITAL_COMMUNITY)
Admission: RE | Admit: 2022-04-01 | Discharge: 2022-04-01 | Disposition: A | Discharge status: Home / Self Care | Payer: Medicare Other | Source: Ambulatory Visit | Attending: Internal Medicine | Admitting: Internal Medicine

## 2022-04-01 VITALS — BP 128/80 | HR 74 | Ht 69.0 in | Wt 190.0 lb

## 2022-04-01 DIAGNOSIS — I5022 Chronic systolic (congestive) heart failure: Secondary | ICD-10-CM | POA: Insufficient documentation

## 2022-04-01 DIAGNOSIS — Z5189 Encounter for other specified aftercare: Secondary | ICD-10-CM | POA: Insufficient documentation

## 2022-04-01 HISTORY — DX: Heart failure, unspecified: I50.9

## 2022-04-01 NOTE — Progress Notes (Signed)
Cardiac Individual Treatment Plan  Patient Details  Name: Troy May MRN: [id number] Date of Birth: [birth year] Referring Provider:   Flowsheet Row INTENSIVE CARDIAC REHAB ORIENT from 04/01/2022 in [facility]  Referring Provider Glori Bickers, MD       Initial Encounter Date:  [place] from 04/01/2022 in [facility]  Date 04/01/22       Visit Diagnosis: Heart failure, chronic systolic ([place])  Patient's Home Medications on Admission:  Current Outpatient Medications:    aspirin EC 81 MG tablet, Take 1 tablet (81 mg total) by mouth daily. Swallow whole., Disp: 30 tablet, Rfl: 11   atorvastatin (LIPITOR) 80 MG tablet, Take 1 tablet (80 mg total) by mouth daily., Disp: 30 tablet, Rfl: 11   cetirizine (ZYRTEC) 10 MG tablet, Take 10 mg by mouth daily as needed for allergies., Disp: , Rfl:    empagliflozin (JARDIANCE) 10 MG TABS tablet, Take 1 tablet (10 mg total) by mouth daily., Disp: 30 tablet, Rfl: 11   metoprolol succinate (TOPROL XL) 25 MG 24 hr tablet, Take 1 tablet (25 mg total) by mouth at bedtime., Disp: 30 tablet, Rfl: 11   OVER THE COUNTER MEDICATION, Take 1 tablet by mouth daily. gummy, Disp: , Rfl:    rivaroxaban (XARELTO) 20 MG TABS tablet, Take 1 tablet (20 mg total) by mouth daily with supper., Disp: 30 tablet, Rfl: 11   sacubitril-valsartan (ENTRESTO) 97-103 MG, Take 1 tablet by mouth 2 (two) times daily., Disp: 180 tablet, Rfl: 3   spironolactone (ALDACTONE) 25 MG tablet, Take 0.5 tablets (12.5 mg total) by mouth daily., Disp: 45 tablet, Rfl: 1   torsemide (DEMADEX) 20 MG tablet, Take 1 tablet (20 mg total) by mouth daily as needed (fluid retention)., Disp: 30 tablet, Rfl: 6  Past Medical History: Past Medical History:  Diagnosis Date   Ascending aortic aneurysm (HCC)    Bilateral renal masses    CAD (coronary artery disease)    CHF (congestive heart failure) (HCC)     HLD (hyperlipidemia)    Hypertension    LV (left ventricular) mural thrombus    Systolic heart failure (HCC)     Tobacco Use: Social History   Tobacco Use  Smoking Status Former   Types: Cigarettes   Quit date: 07/27/2021   Years since quitting: 0.6  Smokeless Tobacco Not on file    Labs: Review Flowsheet  More data may exist      Latest Ref Rng & Units 07/25/2021 07/27/2021 07/28/2021 07/29/2021 09/18/2021  Labs for ITP Cardiac and Pulmonary Rehab  Cholestrol 0 - 200 mg/dL 171  - - - 116   LDL (calc) 0 - 99 mg/dL 123  - - - 67   HDL-C >40 mg/dL 39  - - - 40   Trlycerides <150 mg/dL 47  - - - 46   PH, Arterial 7.350 - 7.450 - 7.413  - - -  PCO2 arterial 32.0 - 48.0 mmHg - 36.0  - - -  Bicarbonate 20.0 - 28.0 mmol/L - 26.4  26.6  22.9  - - -  TCO2 22 - 32 mmol/L - '28  28  24  '$ - - -  Acid-base deficit 0.0 - 2.0 mmol/L - 1.0  - - -  O2 Saturation % - 52.3  76.7  54.0  59.0  95.0  66.0  55.4  -    Capillary Blood Glucose: No results found for: "GLUCAP"   Exercise  Target Goals: Exercise Program Goal: Individual exercise prescription set using results from initial 6 min walk test and THRR while considering  patient's activity barriers and safety.   Exercise Prescription Goal: Initial exercise prescription builds to 30-45 minutes a day of aerobic activity, 2-3 days per week.  Home exercise guidelines will be given to patient during program as part of exercise prescription that the participant will acknowledge.  Activity Barriers & Risk Stratification:  Activity Barriers & Cardiac Risk Stratification - 04/01/22 1555       Activity Barriers & Cardiac Risk Stratification   Activity Barriers Decreased Ventricular Function    Cardiac Risk Stratification High   6MWT 3.33 METs, EF 25-30%            6 Minute Walk:  6 Minute Walk     Row Name 04/01/22 1554         6 Minute Walk   Phase Initial     Distance 1525 feet     Walk Time 6 minutes     # of Rest Breaks 0      MPH 2.89     METS 3.33     RPE 9     Perceived Dyspnea  0     VO2 Peak 11.67     Symptoms No     Resting HR 74 bpm     Resting BP 128/80     Resting Oxygen Saturation  97 %     Exercise Oxygen Saturation  during 6 min walk 95 %     Max Ex. HR 90 bpm     Max Ex. BP 160/84     2 Minute Post BP 138/80              Oxygen Initial Assessment:   Oxygen Re-Evaluation:   Oxygen Discharge (Final Oxygen Re-Evaluation):   Initial Exercise Prescription:  Initial Exercise Prescription - 04/01/22 1500       Date of Initial Exercise RX and Referring Provider   Date 04/01/22    Referring Provider Glori Bickers, MD    Expected Discharge Date 06/04/22      Treadmill   MPH 2.5    Grade 0    Minutes 15    METs 3      Arm Ergometer   Level 2.2    Watts 45    RPM 60    Minutes 15    METs 3      Prescription Details   Frequency (times per week) 3    Duration Progress to 30 minutes of continuous aerobic without signs/symptoms of physical distress      Intensity   THRR 40-80% of Max Heartrate 60-119    Ratings of Perceived Exertion 11-13    Perceived Dyspnea 0-4      Progression   Progression Continue progressive overload as per policy without signs/symptoms or physical distress.      Resistance Training   Training Prescription Yes    Weight 4lb    Reps 10-15             Perform Capillary Blood Glucose checks as needed.  Exercise Prescription Changes:   Exercise Comments:   Exercise Goals and Review:   Exercise Goals     Row Name 04/01/22 1559             Exercise Goals   Increase Physical Activity Yes       Intervention Provide advice, education, support and counseling about physical activity/exercise needs.;Develop an individualized  exercise prescription for aerobic and resistive training based on initial evaluation findings, risk stratification, comorbidities and participant's personal goals.       Expected Outcomes Short Term: Attend  rehab on a regular basis to increase amount of physical activity.;Long Term: Add in home exercise to make exercise part of routine and to increase amount of physical activity.;Long Term: Exercising regularly at least 3-5 days a week.       Increase Strength and Stamina Yes       Intervention Provide advice, education, support and counseling about physical activity/exercise needs.;Develop an individualized exercise prescription for aerobic and resistive training based on initial evaluation findings, risk stratification, comorbidities and participant's personal goals.       Expected Outcomes Short Term: Increase workloads from initial exercise prescription for resistance, speed, and METs.;Short Term: Perform resistance training exercises routinely during rehab and add in resistance training at home;Long Term: Improve cardiorespiratory fitness, muscular endurance and strength as measured by increased METs and functional capacity (6MWT)       Able to understand and use rate of perceived exertion (RPE) scale Yes       Intervention Provide education and explanation on how to use RPE scale       Expected Outcomes Short Term: Able to use RPE daily in rehab to express subjective intensity level;Long Term:  Able to use RPE to guide intensity level when exercising independently       Knowledge and understanding of Target Heart Rate Range (THRR) Yes       Intervention Provide education and explanation of THRR including how the numbers were predicted and where they are located for reference       Expected Outcomes Short Term: Able to state/look up THRR;Short Term: Able to use daily as guideline for intensity in rehab;Long Term: Able to use THRR to govern intensity when exercising independently       Understanding of Exercise Prescription Yes       Intervention Provide education, explanation, and written materials on patient's individual exercise prescription       Expected Outcomes Short Term: Able to explain program  exercise prescription;Long Term: Able to explain home exercise prescription to exercise independently                Exercise Goals Re-Evaluation :   Discharge Exercise Prescription (Final Exercise Prescription Changes):   Nutrition:  Target Goals: Understanding of nutrition guidelines, daily intake of sodium '1500mg'$ , cholesterol '200mg'$ , calories 30% from fat and 7% or less from saturated fats, daily to have 5 or more servings of fruits and vegetables.  Biometrics:  Pre Biometrics - 04/01/22 1553       Pre Biometrics   Waist Circumference 42 inches    Hip Circumference 43.75 inches    Waist to Hip Ratio 0.96 %    Triceps Skinfold 16 mm    % Body Fat 28.9 %    Grip Strength 38 kg    Flexibility 15.75 in    Single Leg Stand 30 seconds              Nutrition Therapy Plan and Nutrition Goals:   Nutrition Assessments:  MEDIFICTS Score Key: ?70 Need to make dietary changes  40-70 Heart Healthy Diet ? 40 Therapeutic Level Cholesterol Diet    Picture Your Plate Scores: <81 Unhealthy dietary pattern with much room for improvement. 41-50 Dietary pattern unlikely to meet recommendations for good health and room for improvement. 51-60 More healthful dietary pattern, with some room for  improvement.  >60 Healthy dietary pattern, although there may be some specific behaviors that could be improved.    Nutrition Goals Re-Evaluation:   Nutrition Goals Re-Evaluation:   Nutrition Goals Discharge (Final Nutrition Goals Re-Evaluation):   Psychosocial: Target Goals: Acknowledge presence or absence of significant depression and/or stress, maximize coping skills, provide positive support system. Participant is able to verbalize types and ability to use techniques and skills needed for reducing stress and depression.  Initial Review & Psychosocial Screening:  Initial Psych Review & Screening - 04/01/22 1403       Initial Review   Current issues with History of  Depression;Current Stress Concerns    Source of Stress Concerns Chronic Illness    Comments Daiquan says he has experienced some depression due to his heart failure diagnosis but denies being depressed currently      Colome? Yes   Travian has his wife, son who lives in the area daughter who lives in [place] and 4 grand children for support.     Barriers   Psychosocial barriers to participate in program The patient should benefit from training in stress management and relaxation.      Screening Interventions   Interventions Encouraged to exercise    Expected Outcomes Long Term Goal: Stressors or current issues are controlled or eliminated.;Short Term goal: Identification and review with participant of any Quality of Life or Depression concerns found by scoring the questionnaire.;Long Term goal: The participant improves quality of Life and PHQ9 Scores as seen by post scores and/or verbalization of changes             Quality of Life Scores:  Quality of Life - 04/01/22 1554       Quality of Life   Select Quality of Life      Quality of Life Scores   Health/Function Pre 23.07 %    Socioeconomic Pre 23.21 %    Psych/Spiritual Pre 25.71 %    Family Pre 24.17 %    GLOBAL Pre 23.81 %            Scores of 19 and below usually indicate a poorer quality of life in these areas.  A difference of  2-3 points is a clinically meaningful difference.  A difference of 2-3 points in the total score of the Quality of Life Index has been associated with significant improvement in overall quality of life, self-image, physical symptoms, and general health in studies assessing change in quality of life.  PHQ-9: Review Flowsheet       04/01/2022 02/08/2022  Depression screen PHQ 2/9  Decreased Interest 0 0  Down, Depressed, Hopeless 0 0  PHQ - 2 Score 0 0   Interpretation of Total Score  Total Score Depression Severity:  1-4 = Minimal depression, 5-9 = Mild depression,  10-14 = Moderate depression, 15-19 = Moderately severe depression, 20-27 = Severe depression   Psychosocial Evaluation and Intervention:   Psychosocial Re-Evaluation:   Psychosocial Discharge (Final Psychosocial Re-Evaluation):   Vocational Rehabilitation: Provide vocational rehab assistance to qualifying candidates.   Vocational Rehab Evaluation & Intervention:  Vocational Rehab - 04/01/22 1353       Initial Vocational Rehab Evaluation & Intervention   Assessment shows need for Vocational Rehabilitation No   Thanos is retired and does not need vocational rehab at this time            Education: Education Goals: Education classes will be provided on a weekly basis, covering  required topics. Participant will state understanding/return demonstration of topics presented.     Core Videos: Exercise    Move It!  Clinical staff conducted group or individual video education with verbal and written material and guidebook.  Patient learns the recommended Pritikin exercise program. Exercise with the goal of living a long, healthy life. Some of the health benefits of exercise include controlled diabetes, healthier blood pressure levels, improved cholesterol levels, improved heart and lung capacity, improved sleep, and better body composition. Everyone should speak with their doctor before starting or changing an exercise routine.  Biomechanical Limitations Clinical staff conducted group or individual video education with verbal and written material and guidebook.  Patient learns how biomechanical limitations can impact exercise and how we can mitigate and possibly overcome limitations to have an impactful and balanced exercise routine.  Body Composition Clinical staff conducted group or individual video education with verbal and written material and guidebook.  Patient learns that body composition (ratio of muscle mass to fat mass) is a key component to assessing overall fitness, rather  than body weight alone. Increased fat mass, especially visceral belly fat, can put [place] at increased risk for metabolic syndrome, type 2 diabetes, heart disease, and even death. It is recommended to combine diet and exercise (cardiovascular and resistance training) to improve your body composition. Seek guidance from your physician and exercise physiologist before implementing an exercise routine.  Exercise Action Plan Clinical staff conducted group or individual video education with verbal and written material and guidebook.  Patient learns the recommended strategies to achieve and enjoy long-term exercise adherence, including variety, self-motivation, self-efficacy, and positive decision making. Benefits of exercise include fitness, good health, weight management, more energy, better sleep, less stress, and overall well-being.  Medical   Heart Disease Risk Reduction Clinical staff conducted group or individual video education with verbal and written material and guidebook.  Patient learns our heart is our most vital organ as it circulates oxygen, nutrients, white blood cells, and hormones throughout the entire body, and carries waste away. Data supports a plant-based eating plan like the Pritikin Program for its effectiveness in slowing progression of and reversing heart disease. The video provides a number of recommendations to address heart disease.   Metabolic Syndrome and Belly Fat  Clinical staff conducted group or individual video education with verbal and written material and guidebook.  Patient learns what metabolic syndrome is, how it leads to heart disease, and how one can reverse it and keep it from coming back. You have metabolic syndrome if you have 3 of the following 5 criteria: abdominal obesity, high blood pressure, high triglycerides, low HDL cholesterol, and high blood sugar.  Hypertension and Heart Disease Clinical staff conducted group or individual video education with verbal and  written material and guidebook.  Patient learns that high blood pressure, or hypertension, is very common in the [place]. Hypertension is largely due to excessive salt intake, but other important risk factors include being overweight, physical inactivity, drinking too much alcohol, smoking, and not eating enough potassium from fruits and vegetables. High blood pressure is a leading risk factor for heart attack, stroke, congestive heart failure, dementia, kidney failure, and premature death. Long-term effects of excessive salt intake include stiffening of the arteries and thickening of heart muscle and organ damage. Recommendations include ways to reduce hypertension and the risk of heart disease.  Diseases of Our Time - Focusing on Diabetes Clinical staff conducted group or individual video education with verbal and written material and  guidebook.  Patient learns why the best way to stop diseases of our time is prevention, through food and other lifestyle changes. Medicine (such as prescription pills and surgeries) is often only a Band-Aid on the problem, not a long-term solution. Most common diseases of our time include obesity, type 2 diabetes, hypertension, heart disease, and cancer. The Pritikin Program is recommended and has been proven to help reduce, reverse, and/or prevent the damaging effects of metabolic syndrome.  Nutrition   Overview of the Pritikin Eating Plan  Clinical staff conducted group or individual video education with verbal and written material and guidebook.  Patient learns about the [place] for disease risk reduction. The [place] emphasizes a wide variety of unrefined, minimally-processed carbohydrates, like fruits, vegetables, whole grains, and legumes. Go, Caution, and Stop food choices are explained. Plant-based and lean animal proteins are emphasized. Rationale provided for low sodium intake for blood pressure control, low added sugars for blood  sugar stabilization, and low added fats and oils for coronary artery disease risk reduction and weight management.  Calorie Density  Clinical staff conducted group or individual video education with verbal and written material and guidebook.  Patient learns about calorie density and how it impacts the Pritikin Eating Plan. Knowing the characteristics of the food you choose will help you decide whether those foods will lead to weight gain or weight loss, and whether you want to consume more or less of them. Weight loss is usually a side effect of the Pritikin Eating Plan because of its focus on low calorie-dense foods.  Label Reading  Clinical staff conducted group or individual video education with verbal and written material and guidebook.  Patient learns about the Pritikin recommended label reading guidelines and corresponding recommendations regarding calorie density, added sugars, sodium content, and whole grains.  Dining Out - Part 1  Clinical staff conducted group or individual video education with verbal and written material and guidebook.  Patient learns that restaurant meals can be sabotaging because they can be so high in calories, fat, sodium, and/or sugar. Patient learns recommended strategies on how to positively address this and avoid unhealthy pitfalls.  Facts on Fats  Clinical staff conducted group or individual video education with verbal and written material and guidebook.  Patient learns that lifestyle modifications can be just as effective, if not more so, as many medications for lowering your risk of heart disease. A Pritikin lifestyle can help to reduce your risk of inflammation and atherosclerosis (cholesterol build-up, or plaque, in the artery walls). Lifestyle interventions such as dietary choices and physical activity address the cause of atherosclerosis. A review of the types of fats and their impact on blood cholesterol levels, along with dietary recommendations to reduce  fat intake is also included.  Nutrition Action Plan  Clinical staff conducted group or individual video education with verbal and written material and guidebook.  Patient learns how to incorporate Pritikin recommendations into their lifestyle. Recommendations include planning and keeping personal health goals in mind as an important part of their success.  Healthy Mind-Set    Healthy Minds, Bodies, Hearts  Clinical staff conducted group or individual video education with verbal and written material and guidebook.  Patient learns how to identify when they are stressed. Video will discuss the impact of that stress, as well as the many benefits of stress management. Patient will also be introduced to stress management techniques. The way we think, act, and feel has an impact on our hearts.  How  Our Thoughts Can Heal Our Hearts  Clinical staff conducted group or individual video education with verbal and written material and guidebook.  Patient learns that negative thoughts can cause depression and anxiety. This can result in negative lifestyle behavior and serious health problems. Cognitive behavioral therapy is an effective method to help control our thoughts in order to change and improve our emotional outlook.  Additional Videos:  Exercise    Improving Performance  Clinical staff conducted group or individual video education with verbal and written material and guidebook.  Patient learns to use a non-linear approach by alternating intensity levels and lengths of time spent exercising to help burn more calories and lose more body fat. Cardiovascular exercise helps improve heart health, metabolism, hormonal balance, blood sugar control, and recovery from fatigue. Resistance training improves strength, endurance, balance, coordination, reaction time, metabolism, and muscle mass. Flexibility exercise improves circulation, posture, and balance. Seek guidance from your physician and exercise  physiologist before implementing an exercise routine and learn your capabilities and proper form for all exercise.  Introduction to Yoga  Clinical staff conducted group or individual video education with verbal and written material and guidebook.  Patient learns about yoga, a discipline of the coming together of mind, breath, and body. The benefits of yoga include improved flexibility, improved range of motion, better posture and core strength, increased lung function, weight loss, and positive self-image. Yoga's heart health benefits include lowered blood pressure, healthier heart rate, decreased cholesterol and triglyceride levels, improved immune function, and reduced stress. Seek guidance from your physician and exercise physiologist before implementing an exercise routine and learn your capabilities and proper form for all exercise.  Medical   Aging: Enhancing Your Quality of Life  Clinical staff conducted group or individual video education with verbal and written material and guidebook.  Patient learns key strategies and recommendations to stay in good physical health and enhance quality of life, such as prevention strategies, having an advocate, securing a [place], and keeping a list of medications and system for tracking them. It also discusses how to avoid risk for bone loss.  Biology of Weight Control  Clinical staff conducted group or individual video education with verbal and written material and guidebook.  Patient learns that weight gain occurs because we consume more calories than we burn (eating more, moving less). Even if your body weight is normal, you may have higher ratios of fat compared to muscle mass. Too much body fat puts you at increased risk for cardiovascular disease, heart attack, stroke, type 2 diabetes, and obesity-related cancers. In addition to exercise, following the [place] can help reduce your risk.  Decoding Lab Results   Clinical staff conducted group or individual video education with verbal and written material and guidebook.  Patient learns that lab test reflects one measurement whose values change over time and are influenced by many factors, including medication, stress, sleep, exercise, food, hydration, pre-existing medical conditions, and more. It is recommended to use the knowledge from this video to become more involved with your lab results and evaluate your numbers to speak with your doctor.   Diseases of Our Time - Overview  Clinical staff conducted group or individual video education with verbal and written material and guidebook.  Patient learns that according to the CDC, 50% to 70% of chronic diseases (such as obesity, type 2 diabetes, elevated lipids, hypertension, and heart disease) are avoidable through lifestyle improvements including healthier food choices, listening to satiety  cues, and increased physical activity.  Sleep Disorders Clinical staff conducted group or individual video education with verbal and written material and guidebook.  Patient learns how good quality and duration of sleep are important to overall health and well-being. Patient also learns about sleep disorders and how they impact health along with recommendations to address them, including discussing with a physician.  Nutrition  Dining Out - Part 2 Clinical staff conducted group or individual video education with verbal and written material and guidebook.  Patient learns how to plan ahead and communicate in order to maximize their dining experience in a healthy and nutritious manner. Included are recommended food choices based on the type of restaurant the patient is visiting.   Fueling a Best boy conducted group or individual video education with verbal and written material and guidebook.  There is a strong connection between our food choices and our health. Diseases like obesity and type 2 diabetes  are very prevalent and are in large-part due to lifestyle choices. The Pritikin Eating Plan provides plenty of food and hunger-curbing satisfaction. It is easy to follow, affordable, and helps reduce health risks.  Menu Workshop  Clinical staff conducted group or individual video education with verbal and written material and guidebook.  Patient learns that restaurant meals can sabotage health goals because they are often packed with calories, fat, sodium, and sugar. Recommendations include strategies to plan ahead and to communicate with the manager, chef, or server to help order a healthier meal.  Planning Your Eating Strategy  Clinical staff conducted group or individual video education with verbal and written material and guidebook.  Patient learns about the [place] and its benefit of reducing the risk of disease. The [place] does not focus on calories. Instead, it emphasizes high-quality, nutrient-rich foods. By knowing the characteristics of the foods, we choose, we can determine their calorie density and make informed decisions.  Targeting Your Nutrition Priorities  Clinical staff conducted group or individual video education with verbal and written material and guidebook.  Patient learns that lifestyle habits have a tremendous impact on disease risk and progression. This video provides eating and physical activity recommendations based on your personal health goals, such as reducing LDL cholesterol, losing weight, preventing or controlling type 2 diabetes, and reducing high blood pressure.  Vitamins and Minerals  Clinical staff conducted group or individual video education with verbal and written material and guidebook.  Patient learns different ways to obtain key vitamins and minerals, including through a recommended healthy diet. It is important to discuss all supplements you take with your doctor.   Healthy Mind-Set    Smoking Cessation  Clinical staff  conducted group or individual video education with verbal and written material and guidebook.  Patient learns that cigarette smoking and tobacco addiction pose a serious health risk which affects millions of people. Stopping smoking will significantly reduce the risk of heart disease, lung disease, and many forms of cancer. Recommended strategies for quitting are covered, including working with your doctor to develop a successful plan.  Culinary   Becoming a Financial trader conducted group or individual video education with verbal and written material and guidebook.  Patient learns that cooking at home can be healthy, cost-effective, quick, and puts them in control. Keys to cooking healthy recipes will include looking at your recipe, assessing your equipment needs, planning ahead, making it simple, choosing cost-effective seasonal ingredients, and limiting the use of added fats, salts, and  sugars.  Cooking - Breakfast and Snacks  Clinical staff conducted group or individual video education with verbal and written material and guidebook.  Patient learns how important breakfast is to satiety and nutrition through the entire day. Recommendations include key foods to eat during breakfast to help stabilize blood sugar levels and to prevent overeating at meals later in the day. Planning ahead is also a key component.  Cooking - Human resources officer conducted group or individual video education with verbal and written material and guidebook.  Patient learns eating strategies to improve overall health, including an approach to cook more at home. Recommendations include thinking of animal protein as a side on your plate rather than center stage and focusing instead on lower calorie dense options like vegetables, fruits, whole grains, and plant-based proteins, such as beans. Making sauces in large quantities to freeze for later and leaving the skin on your vegetables are also  recommended to maximize your experience.  Cooking - Healthy Salads and Dressing Clinical staff conducted group or individual video education with verbal and written material and guidebook.  Patient learns that vegetables, fruits, whole grains, and legumes are the foundations of the [place]. Recommendations include how to incorporate each of these in flavorful and healthy salads, and how to create homemade salad dressings. Proper handling of ingredients is also covered. Cooking - Soups and [employer] - Soups and Desserts Clinical staff conducted group or individual video education with verbal and written material and guidebook.  Patient learns that Pritikin soups and desserts make for easy, nutritious, and delicious snacks and meal components that are low in sodium, fat, sugar, and calorie density, while high in vitamins, minerals, and filling fiber. Recommendations include simple and healthy ideas for soups and desserts.   Overview     The Pritikin Solution Program Overview Clinical staff conducted group or individual video education with verbal and written material and guidebook.  Patient learns that the results of the [place] Program have been documented in more than 100 articles published in peer-reviewed journals, and the benefits include reducing risk factors for (and, in some cases, even reversing) high cholesterol, high blood pressure, type 2 diabetes, obesity, and more! An overview of the three key pillars of the Pritikin Program will be covered: eating well, doing regular exercise, and having a healthy mind-set.  WORKSHOPS  Exercise: Exercise Basics: Building Your Action Plan Clinical staff led group instruction and group discussion with PowerPoint presentation and patient guidebook. To enhance the learning environment the use of posters, models and videos may be added. At the conclusion of this workshop, patients will comprehend the difference between physical  activity and exercise, as well as the benefits of incorporating both, into their routine. Patients will understand the FITT (Frequency, Intensity, Time, and Type) principle and how to use it to build an exercise action plan. In addition, safety concerns and other considerations for exercise and cardiac rehab will be addressed by the presenter. The purpose of this lesson is to promote a comprehensive and effective weekly exercise routine in order to improve patients' overall level of fitness.   Managing Heart Disease: Your Path to a Healthier Heart Clinical staff led group instruction and group discussion with PowerPoint presentation and patient guidebook. To enhance the learning environment the use of posters, models and videos may be added.At the conclusion of this workshop, patients will understand the anatomy and physiology of the heart. Additionally, they will understand how Pritikin's three pillars impact the  risk factors, the progression, and the management of heart disease.  The purpose of this lesson is to provide a high-level overview of the heart, heart disease, and how the Pritikin lifestyle positively impacts risk factors.  Exercise Biomechanics Clinical staff led group instruction and group discussion with PowerPoint presentation and patient guidebook. To enhance the learning environment the use of posters, models and videos may be added. Patients will learn how the structural parts of their bodies function and how these functions impact their daily activities, movement, and exercise. Patients will learn how to promote a neutral spine, learn how to manage pain, and identify ways to improve their physical movement in order to promote healthy living. The purpose of this lesson is to expose patients to common physical limitations that impact physical activity. Participants will learn practical ways to adapt and manage aches and pains, and to minimize their effect on regular exercise.  Patients will learn how to maintain good posture while sitting, walking, and lifting.  Balance Training and Fall Prevention  Clinical staff led group instruction and group discussion with PowerPoint presentation and patient guidebook. To enhance the learning environment the use of posters, models and videos may be added. At the conclusion of this workshop, patients will understand the importance of their sensorimotor skills (vision, proprioception, and the vestibular system) in maintaining their ability to balance as they age. Patients will apply a variety of balancing exercises that are appropriate for their current level of function. Patients will understand the common causes for poor balance, possible solutions to these problems, and ways to modify their physical environment in order to minimize their fall risk. The purpose of this lesson is to teach patients about the importance of maintaining balance as they age and ways to minimize their risk of falling.  WORKSHOPS   Nutrition:  Fueling a Scientist, research (physical sciences) led group instruction and group discussion with PowerPoint presentation and patient guidebook. To enhance the learning environment the use of posters, models and videos may be added. Patients will review the foundational principles of the [place] and understand what constitutes a serving size in each of the food groups. Patients will also learn Pritikin-friendly foods that are better choices when away from home and review make-ahead meal and snack options. Calorie density will be reviewed and applied to three nutrition priorities: weight maintenance, weight loss, and weight gain. The purpose of this lesson is to reinforce (in a group setting) the key concepts around what patients are recommended to eat and how to apply these guidelines when away from home by planning and selecting Pritikin-friendly options. Patients will understand how calorie density may be adjusted for  different weight management goals.  Mindful Eating  Clinical staff led group instruction and group discussion with PowerPoint presentation and patient guidebook. To enhance the learning environment the use of posters, models and videos may be added. Patients will briefly review the concepts of the [place] and the importance of low-calorie dense foods. The concept of mindful eating will be introduced as well as the importance of paying attention to internal hunger signals. Triggers for non-hunger eating and techniques for dealing with triggers will be explored. The purpose of this lesson is to provide patients with the opportunity to review the basic principles of the [place], discuss the value of eating mindfully and how to measure internal cues of hunger and fullness using the Hunger Scale. Patients will also discuss reasons for non-hunger eating and learn strategies to use  for controlling emotional eating.  Targeting Your Nutrition Priorities Clinical staff led group instruction and group discussion with PowerPoint presentation and patient guidebook. To enhance the learning environment the use of posters, models and videos may be added. Patients will learn how to determine their genetic susceptibility to disease by reviewing their family history. Patients will gain insight into the importance of diet as part of an overall healthy lifestyle in mitigating the impact of genetics and other environmental insults. The purpose of this lesson is to provide patients with the opportunity to assess their personal nutrition priorities by looking at their family history, their own health history and current risk factors. Patients will also be able to discuss ways of prioritizing and modifying the [place] for their highest risk areas  Menu  Clinical staff led group instruction and group discussion with PowerPoint presentation and patient guidebook. To enhance the learning  environment the use of posters, models and videos may be added. Using menus brought in from [employer], or printed from [employer], patients will apply the [place] dining out guidelines that were presented in the [employer] video. Patients will also be able to practice these guidelines in a variety of provided scenarios. The purpose of this lesson is to provide patients with the opportunity to practice hands-on learning of the [place] with actual menus and practice scenarios.  Label Reading Clinical staff led group instruction and group discussion with PowerPoint presentation and patient guidebook. To enhance the learning environment the use of posters, models and videos may be added. Patients will review and discuss the Pritikin label reading guidelines presented in Pritikin's Label Reading Educational series video. Using fool labels brought in from local grocery stores and markets, patients will apply the label reading guidelines and determine if the packaged food meet the Pritikin guidelines. The purpose of this lesson is to provide patients with the opportunity to review, discuss, and practice hands-on learning of the Pritikin Label Reading guidelines with actual packaged food labels. [place] Workshops are designed to teach patients ways to prepare quick, simple, and affordable recipes at home. The importance of nutrition's role in chronic disease risk reduction is reflected in its emphasis in the overall Pritikin program. By learning how to prepare essential core Pritikin Eating Plan recipes, patients will increase control over what they eat; be able to customize the flavor of foods without the use of added salt, sugar, or fat; and improve the quality of the food they consume. By learning a set of core recipes which are easily assembled, quickly prepared, and affordable, patients are more likely to prepare more healthy  foods at home. These workshops focus on convenient breakfasts, simple entres, side dishes, and desserts which can be prepared with minimal effort and are consistent with nutrition recommendations for cardiovascular risk reduction. Cooking [employer] are taught by a Engineer, materials (RD) who has been trained by the [employer]. The chef or RD has a clear understanding of the importance of minimizing - if not completely eliminating - added fat, sugar, and sodium in recipes. Throughout the series of [place] Workshop sessions, patients will learn about healthy ingredients and efficient methods of cooking to build confidence in their capability to prepare    Cooking School weekly topics:  Adding Flavor- Sodium-Free  Fast and Healthy Breakfasts  Powerhouse Plant-Based Proteins  Satisfying Salads and Dressings  Simple Sides and Sauces  International Cuisine-Spotlight on the  [place]  Delicious Desserts  Savory Soups  [employer] - Meals in a Agricultural consultant Appetizers and Snacks  Comforting Weekend Breakfasts  One-Pot Wonders   Fast [employer] Your Pritikin Plate  WORKSHOPS   Healthy Mindset (Psychosocial): New Thoughts, New Behaviors Clinical staff led group instruction and group discussion with PowerPoint presentation and patient guidebook. To enhance the learning environment the use of posters, models and videos may be added. Patients will learn and practice techniques for developing effective health and lifestyle goals. Patients will be able to effectively apply the goal setting process learned to develop at least one new personal goal.  The purpose of this lesson is to expose patients to a new skill set of behavior modification techniques such as techniques setting SMART goals, overcoming barriers, and achieving new thoughts and new behaviors.  Managing Moods and Relationships Clinical staff led group  instruction and group discussion with PowerPoint presentation and patient guidebook. To enhance the learning environment the use of posters, models and videos may be added. Patients will learn how emotional and chronic stress factors can impact their health and relationships. They will learn healthy ways to manage their moods and utilize positive coping mechanisms. In addition, ICR patients will learn ways to improve communication skills. The purpose of this lesson is to expose patients to ways of understanding how one's mood and health are intimately connected. Developing a healthy outlook can help build positive relationships and connections with others. Patients will understand the importance of utilizing effective communication skills that include actively listening and being heard. They will learn and understand the importance of the "4 Cs" and especially Connections in fostering of a Healthy Mind-Set.  Healthy Sleep for a Healthy Heart Clinical staff led group instruction and group discussion with PowerPoint presentation and patient guidebook. To enhance the learning environment the use of posters, models and videos may be added. At the conclusion of this workshop, patients will be able to demonstrate knowledge of the importance of sleep to overall health, well-being, and quality of life. They will understand the symptoms of, and treatments for, common sleep disorders. Patients will also be able to identify daytime and nighttime behaviors which impact sleep, and they will be able to apply these tools to help manage sleep-related challenges. The purpose of this lesson is to provide patients with a general overview of sleep and outline the importance of quality sleep. Patients will learn about a few of the most common sleep disorders. Patients will also be introduced to the concept of "sleep hygiene," and discover ways to self-manage certain sleeping problems through simple daily behavior changes. Finally,  the workshop will motivate patients by clarifying the links between quality sleep and their goals of heart-healthy living.   Recognizing and Reducing Stress Clinical staff led group instruction and group discussion with PowerPoint presentation and patient guidebook. To enhance the learning environment the use of posters, models and videos may be added. At the conclusion of this workshop, patients will be able to understand the types of stress reactions, differentiate between acute and chronic stress, and recognize the impact that chronic stress has on their health. They will also be able to apply different coping mechanisms, such as reframing negative self-talk. Patients will have the opportunity to practice a variety of stress management techniques, such as deep abdominal breathing, progressive muscle relaxation, and/or guided imagery.  The purpose of this lesson is to educate patients on the role of stress in their lives  and to provide healthy techniques for coping with it.  Learning Barriers/Preferences:  Learning Barriers/Preferences - 04/01/22 1601       Learning Barriers/Preferences   Learning Barriers Sight   pt wears reading glasses   Learning Preferences Audio;Computer/Internet;Group Instruction;Individual Instruction;Pictoral;Skilled Demonstration;Verbal Instruction;Video;Written Material             Education Topics:  Knowledge Questionnaire Score:  Knowledge Questionnaire Score - 04/01/22 1552       Knowledge Questionnaire Score   Pre Score 21/28             Core Components/Risk Factors/Patient Goals at Admission:  Personal Goals and Risk Factors at Admission - 04/01/22 1602       Core Components/Risk Factors/Patient Goals on Admission    Weight Management Yes;Weight Maintenance   Simultaneous filing. User may not have seen previous data.   Intervention Weight Management: Develop a combined nutrition and exercise program designed to reach desired caloric intake,  while maintaining appropriate intake of nutrient and fiber, sodium and fats, and appropriate energy expenditure required for the weight goal.;Weight Management: Provide education and appropriate resources to help participant work on and attain dietary goals.    Expected Outcomes Short Term: Continue to assess and modify interventions until short term weight is achieved;Long Term: Adherence to nutrition and physical activity/exercise program aimed toward attainment of established weight goal;Weight Maintenance: Understanding of the daily nutrition guidelines, which includes 25-35% calories from fat, 7% or less cal from saturated fats, less than '200mg'$  cholesterol, less than 1.5gm of sodium, & 5 or more servings of fruits and vegetables daily;Understanding recommendations for meals to include 15-35% energy as protein, 25-35% energy from fat, 35-60% energy from carbohydrates, less than '200mg'$  of dietary cholesterol, 20-35 gm of total fiber daily;Understanding of distribution of calorie intake throughout the day with the consumption of 4-5 meals/snacks    Heart Failure Yes    Intervention Provide a combined exercise and nutrition program that is supplemented with education, support and counseling about heart failure. Directed toward relieving symptoms such as shortness of breath, decreased exercise tolerance, and extremity edema.    Expected Outcomes Improve functional capacity of life;Short term: Attendance in program 2-3 days a week with increased exercise capacity. Reported lower sodium intake. Reported increased fruit and vegetable intake. Reports medication compliance.;Short term: Daily weights obtained and reported for increase. Utilizing diuretic protocols set by physician.;Long term: Adoption of self-care skills and reduction of barriers for early signs and symptoms recognition and intervention leading to self-care maintenance.    Hypertension Yes    Intervention Provide education on lifestyle modifcations  including regular physical activity/exercise, weight management, moderate sodium restriction and increased consumption of fresh fruit, vegetables, and low fat dairy, alcohol moderation, and smoking cessation.;Monitor prescription use compliance.    Expected Outcomes Short Term: Continued assessment and intervention until BP is < 140/73m HG in hypertensive participants. < 130/893mHG in hypertensive participants with diabetes, heart failure or chronic kidney disease.;Long Term: Maintenance of blood pressure at goal levels.    Lipids Yes    Intervention Provide education and support for participant on nutrition & aerobic/resistive exercise along with prescribed medications to achieve LDL '70mg'$ , HDL >'40mg'$ .    Expected Outcomes Short Term: Participant states understanding of desired cholesterol values and is compliant with medications prescribed. Participant is following exercise prescription and nutrition guidelines.;Long Term: Cholesterol controlled with medications as prescribed, with individualized exercise RX and with personalized nutrition plan. Value goals: LDL < '70mg'$ , HDL > 40 mg.    Stress  Yes    Intervention Offer individual and/or small group education and counseling on adjustment to heart disease, stress management and health-related lifestyle change. Teach and support self-help strategies.;Refer participants experiencing significant psychosocial distress to appropriate mental health specialists for further evaluation and treatment. When possible, include family members and significant others in education/counseling sessions.    Expected Outcomes Long Term: Emotional wellbeing is indicated by absence of clinically significant psychosocial distress or social isolation.;Short Term: Participant demonstrates changes in health-related behavior, relaxation and other stress management skills, ability to obtain effective social support, and compliance with psychotropic medications if prescribed.              Core Components/Risk Factors/Patient Goals Review:    Core Components/Risk Factors/Patient Goals at Discharge (Final Review):    ITP Comments:  ITP Comments     Row Name 04/01/22 1350           ITP Comments Dr Fransico Him MD, Medical Director Introduction to [place]. Intensive Cardiac Rehab. Initial Orientation Packet Reviewed with the Patient.                Comments: Participant attended orientation for the cardiac rehabilitation program on  04/01/2022  to perform initial intake and exercise walk test. Patient introduced to the [facility] education and orientation packet was reviewed. Completed 6-minute walk test, measurements, initial ITP, and exercise prescription. Vital signs stable. Telemetry-normal sinus rhythm, t wave inversion asymptomatic. Troy Gave RN BSN   Service time was from 1325 to 1530.

## 2022-04-01 NOTE — Progress Notes (Signed)
Cardiac Rehab Medication Review by a Nurse  Does the patient  feel that his/her medications are working for him/her?  YES   Has the patient been experiencing any side effects to the medications prescribed?   NO  Does the patient measure his/her own blood pressure or blood glucose at home?  YES  Does the patient have any problems obtaining medications due to transportation or finances?   NO  Understanding of regimen: good Understanding of indications: good Potential of compliance: good    Nurse comments: Troy May is taking his medications as prescribed and has a good understanding of what his medications are for. [name] checks his blood pressures daily.    [name] See [name] RN 04/01/2022 1:58 PM

## 2022-04-05 ENCOUNTER — Encounter (HOSPITAL_COMMUNITY)
Admission: RE | Admit: 2022-04-05 | Discharge: 2022-04-05 | Disposition: A | Discharge status: Home / Self Care | Payer: Medicare Other | Source: Ambulatory Visit | Attending: Internal Medicine | Admitting: Internal Medicine

## 2022-04-05 DIAGNOSIS — I5022 Chronic systolic (congestive) heart failure: Secondary | ICD-10-CM

## 2022-04-05 DIAGNOSIS — Z5189 Encounter for other specified aftercare: Secondary | ICD-10-CM | POA: Diagnosis not present

## 2022-04-05 NOTE — Progress Notes (Signed)
Daily Session Note  Patient Details  Name: Troy May MRN: [id number] Date of Birth: 11-26-1950 Referring Provider:   Flowsheet Row INTENSIVE CARDIAC REHAB ORIENT from 04/01/2022 in [place]  Referring Provider Troy Bickers, MD       Encounter Date: 04/05/2022  Check In:  Session Check In - 04/05/22 1053       Check-In   Supervising physician immediately available to respond to emergencies [facility] immediately available    Physician(s) Dr [place]    Location [facility]    Staff Present Troy May BS, ACSM-CEP, Exercise Physiologist;Troy Yeoman, RN, Troy Glazier, MS, ACSM-CEP, CCRP, Exercise Physiologist;Troy Starleen Blue, MS, Exercise Physiologist;Troy Celesta Aver, MS, ACSM-CEP, Exercise Physiologist;Troy Ardis Hughs, RN    Virtual Visit No    Medication changes reported     No    Fall or balance concerns reported    No    Tobacco Cessation No Change    Current number of cigarettes/nicotine per day     0    Warm-up and Cool-down Performed as group-led instruction    Resistance Training Performed Yes    VAD Patient? No    PAD/SET Patient? No      Pain Assessment   Currently in Pain? No/denies    Pain Score 0-No pain    Multiple Pain Sites No             Capillary Blood Glucose: No results found for this or any previous visit (from the past 24 hour(s)).   Exercise Prescription Changes - 04/05/22 1026       Response to Exercise   Blood Pressure (Admit) 134/82    Blood Pressure (Exercise) 142/84    Blood Pressure (Exit) 120/72    Heart Rate (Admit) 63 bpm    Heart Rate (Exercise) 100 bpm    Heart Rate (Exit) 63 bpm    Rating of Perceived Exertion (Exercise) 12    Symptoms None    Comments Off to a good start with exercise.    Duration Continue with 30 min of aerobic exercise without signs/symptoms of physical distress.    Intensity THRR unchanged      Progression   Progression Continue to  progress workloads to maintain intensity without signs/symptoms of physical distress.    Average METs 2.3      Resistance Training   Training Prescription Yes    Weight 4lb    Reps 10-15    Time 10 Minutes      Interval Training   Interval Training No      Treadmill   MPH 2.5    Grade 0    Minutes 15    METs 2.91      Arm Ergometer   Level 2.2    Minutes 15    METs 1.8             Social History   Tobacco Use  Smoking Status Former   Types: Cigarettes   Quit date: 07/27/2021   Years since quitting: 0.6  Smokeless Tobacco Not on file    Goals Met:  Exercise tolerated well No report of concerns or symptoms today Strength training completed today  Goals Unmet:  Not Applicable  Comments: Troy May started cardiac rehab today.  Pt tolerated light exercise without difficulty. VSS, telemetry-Sinus Rhythm , asymptomatic.  Medication list reconciled. Pt denies barriers to medicaiton compliance.  PSYCHOSOCIAL ASSESSMENT:  PHQ-0. Pt exhibits positive coping skills, hopeful outlook with supportive family. No psychosocial needs identified  at this time, no psychosocial interventions necessary.    Pt enjoys basketball, baseball.   Pt oriented to exercise equipment and routine.    Understanding verbalized. Troy Gave RN BSN    Dr. Fransico May is Medical Director for Cardiac Rehab at [facility].

## 2022-04-07 ENCOUNTER — Encounter (HOSPITAL_COMMUNITY)
Admission: RE | Admit: 2022-04-07 | Discharge: 2022-04-07 | Disposition: A | Discharge status: Home / Self Care | Payer: Medicare Other | Source: Ambulatory Visit | Attending: Internal Medicine | Admitting: Internal Medicine

## 2022-04-07 DIAGNOSIS — I5022 Chronic systolic (congestive) heart failure: Secondary | ICD-10-CM

## 2022-04-08 ENCOUNTER — Inpatient Hospital Stay (HOSPITAL_COMMUNITY): Admission: RE | Admit: 2022-04-08 | Payer: Medicare Other | Source: Ambulatory Visit

## 2022-04-08 NOTE — Progress Notes (Signed)
Advanced Heart Failure Clinic Note   Primary Care: Agustina Caroli, MD HF Cardiologist:  Dr. Haroldine Laws  HPI:  Troy May is a 71 y.o. male w/ HTN, HLD, CAD, and BiV CHF/iCM.   Admitted 06/2021 w/ CP, Influenza A+. Hypertensive on arrival requiring labetalol gtt, CT chest - PE but + for cardiomegaly, coronary CA2+, mild aneurysmal dilatation of the ascending thoracic aorta 43 mm; bilateral renal masses suspicious for RCC.   Echo 06/2021 EF 25-30% w/LV apical thrombus G2DD, RV moderately HK. R/LHC severe 2V CAD w/ occluded LAD & RCA,  elevated filling pressures w/ low output, CI 1.7. No good options for revasc -> med rx. cMRI LVEF 21%, LAD/RCA territories with scar and do not appear viable, large LV thrombus. Discharge weight 172 lbs.   Echo 10/2021: EF 25-30%, + apical thrombus, moderate MR/TR.   Follow up 10/2021. Metoprolol XL 25 mg daily added. Referred to EP for ICD. Saw Dr. Caryl Comes for evaluation 12/11/21. Wanted to think about ICD.   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 Sees Urology soon.    Follow up 01/2022, off all meds for a few weeks. GDMT restarted. Declined ICD.   Returned to [facility] for HF follow up with his wife 03/05/22. Overall was feeling fine. He was not SOB with activity. Denied abnormal bleeding, palpitations, CP, dizziness, edema, or PND/Orthopnea. Appetite was ok. No fever or chills. Weight at home was 180 pounds. Reported taking all medications. Had not needed torsemide recently.  Today he returns to HF clinic for pharmacist medication titration. At last visit with [facility], Entresto was increased to 97/103 mg BID. Overall he is feeling well today. Had recent visit to [place] to establish care. No dizziness, lightheadedness, CP or palpitations. No fatigue. Notes his libido has decreased. No SOB/DOE. Able to do yard work with no issues. He is doing well in cardiac rehab. Was able to do 15 minutes on the treadmill and  15 minutes on the recumbent bike without needing to take a break. They have a cooking/nutrition class that he has found beneficial. Has been trying to limit his salt and sugar intake in addition to focusing on portion control. Has not been weighing himself at home. He has not used any PRN torsemide. No LEE, PND or orthopnea. BP in clinic elevated at 154/72, 158/76 on recheck. He attributes this to being stressed because he got lost on the way to clinic and was late. BP at cardiac rehab was 134/82 yesterday. BP at [facility] on 8/11 was 148/90.    HF Medications: Metoprolol succinate 25 mg daily Entresto 97/103 mg BID Spironolactone 12.5 mg daily Jardiance 10 mg daily Torsemide 20 mg PRN  Has the patient been experiencing any side effects to the medications prescribed?  no  Does the patient have any problems obtaining medications due to transportation or finances?   [employer] Medicare. Has Horticulturist, commercial for [employer]. Has now established with the [facility] so he can get medications with them. Encouraged him to see if they will fill his Xarelto since he does not qualify for patient assistance for that medication.   Understanding of regimen: fair Understanding of indications: fair Potential of compliance: good - his wife helps with his medications.  Patient understands to avoid NSAIDs. Patient understands to avoid decongestants.    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 - Echo 10/30/21 EF 25-30% + apical clot. Moderate MR/TR. - NYHA II, volume  looks good on exam.  - Labs reviewed from the [facility] 04/09/22: Serum creatinine 1.39, BUN 17, Potassium 4.3, Sodium 136 - Continue torsemide 20 mg PRN. Has not needed recently. - Increase metoprolol succinate to 50 mg daily - Continue Entresto 97/103 mg BID. - Continue spironolactone 12.5 mg daily.  - Continue Jardiance 10 mg daily.  - Has seen EP. Declines ICD. - Doing better with medication compliance.   2. CAD - Cath 11/22 severe 2V CAD, mid-distal LAD 100% stenosed, ost-prox RCA 100% stenosed  - cMRI 06/2021  EF 21% and large inferior and apical scars with minimal viability in the RCA/LAD territories. Large LV clot.  - No options for revascularization, continue medical therapy. - No s/s angina - Continue ASA 81 mg daily + atorvastatin 80 + ? blocker. - Has started cardiac rehab   3. LV Thrombus  - 2/2 severe LV dysfunction  - Continue Xarelto 20 mg daily   4. Bilateral Renal Masses, suspicion for RCC - Incidental finding on chest CT. Also noted on recent MRI abdomen - Not amenable to coiling per IR. - Following with Urology.   5. Hypertension  - BP elevated.  - Increase GDMT as above.   6. Ascending Thoracic Aortic Aneurysm - 43 mm in diameter on chest CT - Asc Ao 4.0 cm on echo 10/2021.  Follow up 2 months with [facility].    Audry Riles, PharmD, BCPS, BCCP, CPP Heart Failure Clinic Pharmacist [phone]

## 2022-04-09 ENCOUNTER — Encounter (HOSPITAL_COMMUNITY): Payer: Medicare Other

## 2022-04-12 ENCOUNTER — Encounter (HOSPITAL_COMMUNITY)
Admission: RE | Admit: 2022-04-12 | Discharge: 2022-04-12 | Disposition: A | Discharge status: Home / Self Care | Payer: Medicare Other | Source: Ambulatory Visit | Attending: Internal Medicine | Admitting: Internal Medicine

## 2022-04-12 DIAGNOSIS — I5022 Chronic systolic (congestive) heart failure: Secondary | ICD-10-CM

## 2022-04-14 ENCOUNTER — Encounter (HOSPITAL_COMMUNITY)
Admission: RE | Admit: 2022-04-14 | Discharge: 2022-04-14 | Disposition: A | Discharge status: Home / Self Care | Payer: Medicare Other | Source: Ambulatory Visit | Attending: Internal Medicine | Admitting: Internal Medicine

## 2022-04-14 DIAGNOSIS — I5022 Chronic systolic (congestive) heart failure: Secondary | ICD-10-CM

## 2022-04-14 NOTE — Progress Notes (Signed)
Reviewed home exercise guidelines with patient including endpoints, temperature precautions, target heart rate and rate of perceived exertion. Patient plans to walk as his mode of home exercise. Patient also has a stationary bike that he can use. Patient voices understanding of instructions given.  Sol Passer, MS, ACSM CEP

## 2022-04-15 ENCOUNTER — Ambulatory Visit (HOSPITAL_COMMUNITY)
Admission: RE | Admit: 2022-04-15 | Discharge: 2022-04-15 | Disposition: A | Discharge status: Home / Self Care | Payer: Medicare Other | Source: Ambulatory Visit | Attending: Internal Medicine | Admitting: Internal Medicine

## 2022-04-15 VITALS — BP 154/72 | HR 69 | Wt 193.8 lb

## 2022-04-15 DIAGNOSIS — I110 Hypertensive heart disease with heart failure: Secondary | ICD-10-CM | POA: Insufficient documentation

## 2022-04-15 DIAGNOSIS — I5022 Chronic systolic (congestive) heart failure: Secondary | ICD-10-CM | POA: Diagnosis not present

## 2022-04-15 DIAGNOSIS — I2510 Atherosclerotic heart disease of native coronary artery without angina pectoris: Secondary | ICD-10-CM | POA: Diagnosis not present

## 2022-04-15 DIAGNOSIS — I7121 Aneurysm of the ascending aorta, without rupture: Secondary | ICD-10-CM | POA: Diagnosis not present

## 2022-04-15 DIAGNOSIS — N289 Disorder of kidney and ureter, unspecified: Secondary | ICD-10-CM | POA: Insufficient documentation

## 2022-04-15 DIAGNOSIS — I8290 Acute embolism and thrombosis of unspecified vein: Secondary | ICD-10-CM | POA: Insufficient documentation

## 2022-04-15 DIAGNOSIS — I255 Ischemic cardiomyopathy: Secondary | ICD-10-CM | POA: Insufficient documentation

## 2022-04-15 DIAGNOSIS — I5082 Biventricular heart failure: Secondary | ICD-10-CM | POA: Diagnosis not present

## 2022-04-15 DIAGNOSIS — Z7982 Long term (current) use of aspirin: Secondary | ICD-10-CM | POA: Insufficient documentation

## 2022-04-15 DIAGNOSIS — E785 Hyperlipidemia, unspecified: Secondary | ICD-10-CM | POA: Diagnosis not present

## 2022-04-15 MED ORDER — METOPROLOL SUCCINATE ER 50 MG PO TB24: 50.0000 mg | ORAL_TABLET | Freq: Every day | ORAL | 11 refills | Status: DC

## 2022-04-15 NOTE — Patient Instructions (Signed)
It was a pleasure seeing you today!  MEDICATIONS: -We are changing your medications today -Increase metoprolol succinate to 50 mg (1 tablet) daily. You may take 2 tablets of the 25 mg strength daily until you pick up the new strength.  -Call if you have questions about your medications.   NEXT APPOINTMENT: Return to clinic in 2 months with [facility].  In general, to take care of your heart failure: -Limit your fluid intake to 2 Liters (half-gallon) per day.   -Limit your salt intake to ideally 2-3 grams (2000-3000 mg) per day. -Weigh yourself daily and record, and bring that "weight diary" to your next appointment.  (Weight gain of 2-3 pounds in 1 day typically means fluid weight.) -The medications for your heart are to help your heart and help you live longer.   -Please contact us before stopping any of your heart medications.  Call the clinic at [phone] with questions or to reschedule future appointments.

## 2022-04-16 ENCOUNTER — Encounter (HOSPITAL_COMMUNITY)
Admission: RE | Admit: 2022-04-16 | Discharge: 2022-04-16 | Disposition: A | Discharge status: Home / Self Care | Payer: Medicare Other | Source: Ambulatory Visit | Attending: Internal Medicine | Admitting: Internal Medicine

## 2022-04-16 DIAGNOSIS — I5022 Chronic systolic (congestive) heart failure: Secondary | ICD-10-CM | POA: Diagnosis not present

## 2022-04-19 ENCOUNTER — Encounter (HOSPITAL_COMMUNITY)
Admission: RE | Admit: 2022-04-19 | Discharge: 2022-04-19 | Disposition: A | Discharge status: Home / Self Care | Payer: Medicare Other | Source: Ambulatory Visit | Attending: Internal Medicine | Admitting: Internal Medicine

## 2022-04-19 DIAGNOSIS — I5022 Chronic systolic (congestive) heart failure: Secondary | ICD-10-CM | POA: Diagnosis not present

## 2022-04-20 NOTE — Progress Notes (Signed)
Cardiac Individual Treatment Plan  Patient Details  Name: Troy May MRN: [id number] Date of Birth: 1950/11/04 Referring Provider:   Flowsheet Row INTENSIVE CARDIAC REHAB ORIENT from 04/01/2022 in [facility]  Referring Provider Glori Bickers, MD       Initial Encounter Date:  [place] from 04/01/2022 in [facility]  Date 04/01/22       Visit Diagnosis: Heart failure, chronic systolic ([place])  Patient's Home Medications on Admission:  Current Outpatient Medications:    aspirin EC 81 MG tablet, Take 1 tablet (81 mg total) by mouth daily. Swallow whole., Disp: 30 tablet, Rfl: 11   atorvastatin (LIPITOR) 80 MG tablet, Take 1 tablet (80 mg total) by mouth daily., Disp: 30 tablet, Rfl: 11   cetirizine (ZYRTEC) 10 MG tablet, Take 10 mg by mouth daily as needed for allergies., Disp: , Rfl:    empagliflozin (JARDIANCE) 10 MG TABS tablet, Take 1 tablet (10 mg total) by mouth daily., Disp: 30 tablet, Rfl: 11   metoprolol succinate (TOPROL XL) 50 MG 24 hr tablet, Take 1 tablet (50 mg total) by mouth at bedtime., Disp: 30 tablet, Rfl: 11   OVER THE COUNTER MEDICATION, Take 1 tablet by mouth daily. gummy, Disp: , Rfl:    rivaroxaban (XARELTO) 20 MG TABS tablet, Take 1 tablet (20 mg total) by mouth daily with supper., Disp: 30 tablet, Rfl: 11   sacubitril-valsartan (ENTRESTO) 97-103 MG, Take 1 tablet by mouth 2 (two) times daily., Disp: 180 tablet, Rfl: 3   spironolactone (ALDACTONE) 25 MG tablet, Take 0.5 tablets (12.5 mg total) by mouth daily., Disp: 45 tablet, Rfl: 1   torsemide (DEMADEX) 20 MG tablet, Take 1 tablet (20 mg total) by mouth daily as needed (fluid retention)., Disp: 30 tablet, Rfl: 6  Past Medical History: Past Medical History:  Diagnosis Date   Ascending aortic aneurysm (HCC)    Bilateral renal masses    CAD (coronary artery disease)    CHF (congestive heart failure) (HCC)     HLD (hyperlipidemia)    Hypertension    LV (left ventricular) mural thrombus    Systolic heart failure (HCC)     Tobacco Use: Social History   Tobacco Use  Smoking Status Former   Types: Cigarettes   Quit date: 07/27/2021   Years since quitting: 0.7  Smokeless Tobacco Not on file    Labs: Review Flowsheet  More data may exist      Latest Ref Rng & Units 07/25/2021 07/27/2021 07/28/2021 07/29/2021 09/18/2021  Labs for ITP Cardiac and Pulmonary Rehab  Cholestrol 0 - 200 mg/dL 171  - - - 116   LDL (calc) 0 - 99 mg/dL 123  - - - 67   HDL-C >40 mg/dL 39  - - - 40   Trlycerides <150 mg/dL 47  - - - 46   PH, Arterial 7.350 - 7.450 - 7.413  - - -  PCO2 arterial 32.0 - 48.0 mmHg - 36.0  - - -  Bicarbonate 20.0 - 28.0 mmol/L - 26.4  26.6  22.9  - - -  TCO2 22 - 32 mmol/L - '28  28  24  '$ - - -  Acid-base deficit 0.0 - 2.0 mmol/L - 1.0  - - -  O2 Saturation % - 52.3  76.7  54.0  59.0  95.0  66.0  55.4  -    Capillary Blood Glucose: No results found for: "GLUCAP"   Exercise  Target Goals: Exercise Program Goal: Individual exercise prescription set using results from initial 6 min walk test and THRR while considering  patient's activity barriers and safety.   Exercise Prescription Goal: Initial exercise prescription builds to 30-45 minutes a day of aerobic activity, 2-3 days per week.  Home exercise guidelines will be given to patient during program as part of exercise prescription that the participant will acknowledge.  Activity Barriers & Risk Stratification:  Activity Barriers & Cardiac Risk Stratification - 04/01/22 1555       Activity Barriers & Cardiac Risk Stratification   Activity Barriers Decreased Ventricular Function    Cardiac Risk Stratification High   6MWT 3.33 METs, EF 25-30%            6 Minute Walk:  6 Minute Walk     Row Name 04/01/22 1554         6 Minute Walk   Phase Initial     Distance 1525 feet     Walk Time 6 minutes     # of Rest Breaks 0      MPH 2.89     METS 3.33     RPE 9     Perceived Dyspnea  0     VO2 Peak 11.67     Symptoms No     Resting HR 74 bpm     Resting BP 128/80     Resting Oxygen Saturation  97 %     Exercise Oxygen Saturation  during 6 min walk 95 %     Max Ex. HR 90 bpm     Max Ex. BP 160/84     2 Minute Post BP 138/80              Oxygen Initial Assessment:   Oxygen Re-Evaluation:   Oxygen Discharge (Final Oxygen Re-Evaluation):   Initial Exercise Prescription:  Initial Exercise Prescription - 04/01/22 1500       Date of Initial Exercise RX and Referring Provider   Date 04/01/22    Referring Provider Glori Bickers, MD    Expected Discharge Date 06/04/22      Treadmill   MPH 2.5    Grade 0    Minutes 15    METs 3      Arm Ergometer   Level 2.2    Watts 45    RPM 60    Minutes 15    METs 3      Prescription Details   Frequency (times per week) 3    Duration Progress to 30 minutes of continuous aerobic without signs/symptoms of physical distress      Intensity   THRR 40-80% of Max Heartrate 60-119    Ratings of Perceived Exertion 11-13    Perceived Dyspnea 0-4      Progression   Progression Continue progressive overload as per policy without signs/symptoms or physical distress.      Resistance Training   Training Prescription Yes    Weight 4lb    Reps 10-15             Perform Capillary Blood Glucose checks as needed.  Exercise Prescription Changes:   Exercise Prescription Changes     Row Name 04/05/22 1026 04/19/22 1032           Response to Exercise   Blood Pressure (Admit) 134/82 102/62      Blood Pressure (Exercise) 142/84 160/80      Blood Pressure (Exit) 120/72 118/72      Heart  Rate (Admit) 63 bpm 66 bpm      Heart Rate (Exercise) 100 bpm 120 bpm      Heart Rate (Exit) 63 bpm 77 bpm      Rating of Perceived Exertion (Exercise) 12 13      Symptoms None None      Comments Off to a good start with exercise. Increased WL on TM.       Duration Continue with 30 min of aerobic exercise without signs/symptoms of physical distress. Continue with 30 min of aerobic exercise without signs/symptoms of physical distress.      Intensity THRR unchanged THRR unchanged        Progression   Progression Continue to progress workloads to maintain intensity without signs/symptoms of physical distress. Continue to progress workloads to maintain intensity without signs/symptoms of physical distress.      Average METs 2.3 3.5        Resistance Training   Training Prescription Yes Yes      Weight 4lb 4lb      Reps 10-15 10-15      Time 10 Minutes 10 Minutes        Interval Training   Interval Training No No        Treadmill   MPH 2.5 2.8      Grade 0 4      Minutes 15 15      METs 2.91 4.69        NuStep   Level -- 4      SPM -- 85      Minutes -- 15      METs -- 2.3        Arm Ergometer   Level 2.2 --      Minutes 15 --      METs 1.8 --        Home Exercise Plan   Plans to continue exercise at -- Home (comment)  walking, stationary bike      Frequency -- Add 2 additional days to program exercise sessions.      Initial Home Exercises Provided -- 04/14/22               Exercise Comments:   Exercise Comments     Row Name 04/05/22 1127 04/14/22 1042 04/19/22 1117       Exercise Comments Patient tolerated 1st session of exericse well without symptoms. Reviewed METs with patient. Reviewed home exercise guidelines and goals with patient. Reviewed METs with patient.              Exercise Goals and Review:   Exercise Goals     Row Name 04/01/22 1559             Exercise Goals   Increase Physical Activity Yes       Intervention Provide advice, education, support and counseling about physical activity/exercise needs.;Develop an individualized exercise prescription for aerobic and resistive training based on initial evaluation findings, risk stratification, comorbidities and participant's personal goals.        Expected Outcomes Short Term: Attend rehab on a regular basis to increase amount of physical activity.;Long Term: Add in home exercise to make exercise part of routine and to increase amount of physical activity.;Long Term: Exercising regularly at least 3-5 days a week.       Increase Strength and Stamina Yes       Intervention Provide advice, education, support and counseling about physical activity/exercise needs.;Develop an individualized exercise prescription for aerobic and resistive  training based on initial evaluation findings, risk stratification, comorbidities and participant's personal goals.       Expected Outcomes Short Term: Increase workloads from initial exercise prescription for resistance, speed, and METs.;Short Term: Perform resistance training exercises routinely during rehab and add in resistance training at home;Long Term: Improve cardiorespiratory fitness, muscular endurance and strength as measured by increased METs and functional capacity (6MWT)       Able to understand and use rate of perceived exertion (RPE) scale Yes       Intervention Provide education and explanation on how to use RPE scale       Expected Outcomes Short Term: Able to use RPE daily in rehab to express subjective intensity level;Long Term:  Able to use RPE to guide intensity level when exercising independently       Knowledge and understanding of Target Heart Rate Range (THRR) Yes       Intervention Provide education and explanation of THRR including how the numbers were predicted and where they are located for reference       Expected Outcomes Short Term: Able to state/look up THRR;Short Term: Able to use daily as guideline for intensity in rehab;Long Term: Able to use THRR to govern intensity when exercising independently       Understanding of Exercise Prescription Yes       Intervention Provide education, explanation, and written materials on patient's individual exercise prescription       Expected  Outcomes Short Term: Able to explain program exercise prescription;Long Term: Able to explain home exercise prescription to exercise independently                Exercise Goals Re-Evaluation :  Exercise Goals Re-Evaluation     Row Name 04/05/22 1127 04/14/22 1042           Exercise Goal Re-Evaluation   Exercise Goals Review Increase Physical Activity;Able to understand and use rate of perceived exertion (RPE) scale;Increase Strength and Stamina Increase Physical Activity;Able to understand and use rate of perceived exertion (RPE) scale;Increase Strength and Stamina;Understanding of Exercise Prescription;Knowledge and understanding of Target Heart Rate Range (THRR)      Comments Patient able to understand and use RPE scale appropriately. Reviewed exercise prescription with patient. Patient is doing yard work and push-ups but no formal exercise. Patient plans to walk and has a stationary bike at home that he can use as his mode of home exercise. Encouraged patient to try and achieve 150 minutes of aerobic exericse, and patient is amenable to this. Patient's goal is to be able to decrease amount of medications he takes with MD approval.      Expected Outcomes Progress workloads as tolerated to help improve cardiorespiratory fitness. Patient will walk or ride his staionary bike 30 minutes, at least 2 days/week in addition to exercise at cardiac rehab to achieve 150 minutes of aerobic exercise/week.               Discharge Exercise Prescription (Final Exercise Prescription Changes):  Exercise Prescription Changes - 04/19/22 1032       Response to Exercise   Blood Pressure (Admit) 102/62    Blood Pressure (Exercise) 160/80    Blood Pressure (Exit) 118/72    Heart Rate (Admit) 66 bpm    Heart Rate (Exercise) 120 bpm    Heart Rate (Exit) 77 bpm    Rating of Perceived Exertion (Exercise) 13    Symptoms None    Comments Increased WL on TM.  Duration Continue with 30 min of aerobic  exercise without signs/symptoms of physical distress.    Intensity THRR unchanged      Progression   Progression Continue to progress workloads to maintain intensity without signs/symptoms of physical distress.    Average METs 3.5      Resistance Training   Training Prescription Yes    Weight 4lb    Reps 10-15    Time 10 Minutes      Interval Training   Interval Training No      Treadmill   MPH 2.8    Grade 4    Minutes 15    METs 4.69      NuStep   Level 4    SPM 85    Minutes 15    METs 2.3      Home Exercise Plan   Plans to continue exercise at Home (comment)   walking, stationary bike   Frequency Add 2 additional days to program exercise sessions.    Initial Home Exercises Provided 04/14/22             Nutrition:  Target Goals: Understanding of nutrition guidelines, daily intake of sodium '1500mg'$ , cholesterol '200mg'$ , calories 30% from fat and 7% or less from saturated fats, daily to have 5 or more servings of fruits and vegetables.  Biometrics:  Pre Biometrics - 04/01/22 1553       Pre Biometrics   Waist Circumference 42 inches    Hip Circumference 43.75 inches    Waist to Hip Ratio 0.96 %    Triceps Skinfold 16 mm    % Body Fat 28.9 %    Grip Strength 38 kg    Flexibility 15.75 in    Single Leg Stand 30 seconds              Nutrition Therapy Plan and Nutrition Goals:  Nutrition Therapy & Goals - 04/05/22 1449       Nutrition Therapy   Diet Heart Healthy Diet    Drug/Food Interactions Statins/Certain Fruits      Personal Nutrition Goals   Nutrition Goal Patient to choose a daily variety of fruits, vegetables, whole grains, lean protein/plant protein, and nonfat dairy as part of heart healthy lifestyle    Personal Goal #2 Patient to limit sodium intake to '1500mg'$  daily    Personal Goal #3 Patient to identify and limit food sources of saturated fat, trans fat, sodium, and refined carbohydrates      Intervention Plan   Intervention  Prescribe, educate and counsel regarding individualized specific dietary modifications aiming towards targeted core components such as weight, hypertension, lipid management, diabetes, heart failure and other comorbidities.;Nutrition handout(s) given to patient.    Expected Outcomes Short Term Goal: Understand basic principles of dietary content, such as calories, fat, sodium, cholesterol and nutrients.;Long Term Goal: Adherence to prescribed nutrition plan.             Nutrition Assessments:  MEDIFICTS Score Key: ?70 Need to make dietary changes  40-70 Heart Healthy Diet ? 40 Therapeutic Level Cholesterol Diet    Picture Your Plate Scores: <44 Unhealthy dietary pattern with much room for improvement. 41-50 Dietary pattern unlikely to meet recommendations for good health and room for improvement. 51-60 More healthful dietary pattern, with some room for improvement.  >60 Healthy dietary pattern, although there may be some specific behaviors that could be improved.    Nutrition Goals Re-Evaluation:  Nutrition Goals Re-Evaluation     [place] Name 04/05/22 [phone]  Goals   Current Weight 191 lb 12.8 oz (87 kg)       Comment Lipid panel improved. HDL 40, GFR 49, Cr 1.52, AST 43                Nutrition Goals Re-Evaluation:  Nutrition Goals Re-Evaluation     [place] Name 04/05/22 1449             Goals   Current Weight 191 lb 12.8 oz (87 kg)       Comment Lipid panel improved. HDL 40, GFR 49, Cr 1.52, AST 43                Nutrition Goals Discharge (Final Nutrition Goals Re-Evaluation):  Nutrition Goals Re-Evaluation - 04/05/22 1449       Goals   Current Weight 191 lb 12.8 oz (87 kg)    Comment Lipid panel improved. HDL 40, GFR 49, Cr 1.52, AST 43             Psychosocial: Target Goals: Acknowledge presence or absence of significant depression and/or stress, maximize coping skills, provide positive support system. Participant is able to verbalize  types and ability to use techniques and skills needed for reducing stress and depression.  Initial Review & Psychosocial Screening:  Initial Psych Review & Screening - 04/01/22 1403       Initial Review   Current issues with History of Depression;Current Stress Concerns    Source of Stress Concerns Chronic Illness    Comments Dayson says he has experienced some depression due to his heart failure diagnosis but denies being depressed currently      [place]? Yes   Dickson has his wife, son who lives in the area daughter who lives in [place] and 4 grand children for support.     Barriers   Psychosocial barriers to participate in program The patient should benefit from training in stress management and relaxation.      Screening Interventions   Interventions Encouraged to exercise    Expected Outcomes Long Term Goal: Stressors or current issues are controlled or eliminated.;Short Term goal: Identification and review with participant of any Quality of Life or Depression concerns found by scoring the questionnaire.;Long Term goal: The participant improves quality of Life and PHQ9 Scores as seen by post scores and/or verbalization of changes             Quality of Life Scores:  Quality of Life - 04/01/22 1554       Quality of Life   Select Quality of Life      Quality of Life Scores   Health/Function Pre 23.07 %    Socioeconomic Pre 23.21 %    Psych/Spiritual Pre 25.71 %    Family Pre 24.17 %    GLOBAL Pre 23.81 %            Scores of 19 and below usually indicate a poorer quality of life in these areas.  A difference of  2-3 points is a clinically meaningful difference.  A difference of 2-3 points in the total score of the Quality of Life Index has been associated with significant improvement in overall quality of life, self-image, physical symptoms, and general health in studies assessing change in quality of life.  PHQ-9: Review Flowsheet        04/01/2022 02/08/2022  Depression screen PHQ 2/9  Decreased Interest 0 0  Down, Depressed, Hopeless 0 0  PHQ - 2 Score 0  0   Interpretation of Total Score  Total Score Depression Severity:  1-4 = Minimal depression, 5-9 = Mild depression, 10-14 = Moderate depression, 15-19 = Moderately severe depression, 20-27 = Severe depression   Psychosocial Evaluation and Intervention:   Psychosocial Re-Evaluation:  Psychosocial Re-Evaluation     [place] Name 04/05/22 1420 04/20/22 1434           Psychosocial Re-Evaluation   Current issues with Current Stress Concerns Current Stress Concerns      Comments Nickalos started intensive cardiac rehab on 04/05/22 and did not voice having any increased concerns or stressors. Augustino has not  voiced  having any increased concerns or stressors since starting intensive ca      Expected Outcomes Haedyn will have decreased stressors/ depression upon completion of intensive cardiac rehab Isaak will have decreased stressors/ depression upon completion of intensive cardiac rehab      Interventions Stress management education;Encouraged to attend Cardiac Rehabilitation for the exercise Stress management education;Encouraged to attend Cardiac Rehabilitation for the exercise      Continue Psychosocial Services  Follow up required by staff Follow up required by staff        Initial Review   Source of Stress Concerns Chronic Illness Chronic Illness      Comments Will continue to monitor and offer support as needed Will continue to monitor and offer support as needed               Psychosocial Discharge (Final Psychosocial Re-Evaluation):  Psychosocial Re-Evaluation - 04/20/22 1434       Psychosocial Re-Evaluation   Current issues with Current Stress Concerns    Comments Madex has not  voiced  having any increased concerns or stressors since starting intensive ca    Expected Outcomes Rue will have decreased stressors/ depression upon completion of intensive cardiac rehab     Interventions Stress management education;Encouraged to attend Cardiac Rehabilitation for the exercise    Continue Psychosocial Services  Follow up required by staff      Initial Review   Source of Stress Concerns Chronic Illness    Comments Will continue to monitor and offer support as needed             Vocational Rehabilitation: Provide vocational rehab assistance to qualifying candidates.   Vocational Rehab Evaluation & Intervention:  Vocational Rehab - 04/01/22 1353       Initial Vocational Rehab Evaluation & Intervention   Assessment shows need for Vocational Rehabilitation No   Elzy is retired and does not need vocational rehab at this time            Education: Education Goals: Education classes will be provided on a weekly basis, covering required topics. Participant will state understanding/return demonstration of topics presented.    Education     Row Name 04/05/22 1200     Education   Cardiac Education Topics Pritikin   Select Core Videos     Core Videos   Instruction Review Code 1- Verbalizes Understanding   Class Start Time 1139   Class Stop Time 1217   Class Time Calculation (min) 38 min    [place] Name 04/07/22 1300     Education   Cardiac Education Topics [place]   Educator Dietitian   Weekly Topic Fast Evening Meals   Instruction Review Code 1- Verbalizes Understanding   Class Start Time 1137   Class Stop Time 1223   Class Time  Calculation (min) 46 min    Row Name 04/12/22 1600     Education   Cardiac Education Topics Pritikin   IT sales professional Nutrition   Nutrition Workshop Fueling a Designer, multimedia   Instruction Review Code 1- Information systems manager   Class Start Time 1150   Class Stop Time 1240   Class Time Calculation (min) 50 min    [place] Name 04/14/22 1200     Education   Cardiac Education Topics Pritikin   Sales executive   Weekly Topic Adding Flavor - Sodium-Free   Instruction Review Code 1- Verbalizes Understanding   Class Start Time 1138   Class Stop Time 1228   Class Time Calculation (min) 50 min    [place] Name 04/16/22 1200     Education   Cardiac Education Topics Pritikin   Tax inspector General Education   General Education Heart Disease Risk Reduction   Instruction Review Code 1- Verbalizes Understanding   Class Start Time 1145   Class Stop Time 1221   Class Time Calculation (min) 36 min    [place] Name 04/19/22 1300     Education   Cardiac Education Topics Pritikin   [employer]     Workshops   Educator Exercise Physiologist   Select Psychosocial   Psychosocial Workshop Recognizing and Reducing Stress   Instruction Review Code 1- Verbalizes Understanding   Class Start Time 1150   Class Stop Time 1238   Class Time Calculation (min) 48 min            Core Videos: Exercise    Move It!  Clinical staff conducted group or individual video education with verbal and written material and guidebook.  Patient learns the recommended Pritikin exercise program. Exercise with the goal of living a long, healthy life. Some of the health benefits of exercise include controlled diabetes, healthier blood pressure levels, improved cholesterol levels, improved heart and lung capacity, improved sleep, and better body composition. Everyone should speak with their doctor before starting or changing an exercise routine.  Biomechanical Limitations Clinical staff conducted group or individual video education with verbal and written material and guidebook.  Patient learns how biomechanical limitations can impact exercise and how we can mitigate and possibly overcome limitations to have an impactful and balanced exercise routine.  Body Composition Clinical staff conducted group or individual video education with verbal  and written material and guidebook.  Patient learns that body composition (ratio of muscle mass to fat mass) is a key component to assessing overall fitness, rather than body weight alone. Increased fat mass, especially visceral belly fat, can put [place] at increased risk for metabolic syndrome, type 2 diabetes, heart disease, and even death. It is recommended to combine diet and exercise (cardiovascular and resistance training) to improve your body composition. Seek guidance from your physician and exercise physiologist before implementing an exercise routine.  Exercise Action Plan Clinical staff conducted group or individual video education with verbal and written material and guidebook.  Patient learns the recommended strategies to achieve and enjoy long-term exercise adherence, including variety, self-motivation, self-efficacy, and positive decision making. Benefits of exercise include fitness, good health, weight management, more energy, better sleep, less stress, and overall well-being.  Medical   Heart Disease Risk Reduction Clinical staff conducted group or individual  video education with verbal and written material and guidebook.  Patient learns our heart is our most vital organ as it circulates oxygen, nutrients, white blood cells, and hormones throughout the entire body, and carries waste away. Data supports a plant-based eating plan like the Pritikin Program for its effectiveness in slowing progression of and reversing heart disease. The video provides a number of recommendations to address heart disease.   Metabolic Syndrome and Belly Fat  Clinical staff conducted group or individual video education with verbal and written material and guidebook.  Patient learns what metabolic syndrome is, how it leads to heart disease, and how one can reverse it and keep it from coming back. You have metabolic syndrome if you have 3 of the following 5 criteria: abdominal obesity, high blood pressure, high  triglycerides, low HDL cholesterol, and high blood sugar.  Hypertension and Heart Disease Clinical staff conducted group or individual video education with verbal and written material and guidebook.  Patient learns that high blood pressure, or hypertension, is very common in the [place]. Hypertension is largely due to excessive salt intake, but other important risk factors include being overweight, physical inactivity, drinking too much alcohol, smoking, and not eating enough potassium from fruits and vegetables. High blood pressure is a leading risk factor for heart attack, stroke, congestive heart failure, dementia, kidney failure, and premature death. Long-term effects of excessive salt intake include stiffening of the arteries and thickening of heart muscle and organ damage. Recommendations include ways to reduce hypertension and the risk of heart disease.  Diseases of Our Time - Focusing on Diabetes Clinical staff conducted group or individual video education with verbal and written material and guidebook.  Patient learns why the best way to stop diseases of our time is prevention, through food and other lifestyle changes. Medicine (such as prescription pills and surgeries) is often only a Band-Aid on the problem, not a long-term solution. Most common diseases of our time include obesity, type 2 diabetes, hypertension, heart disease, and cancer. The Pritikin Program is recommended and has been proven to help reduce, reverse, and/or prevent the damaging effects of metabolic syndrome.  Nutrition   Overview of the Pritikin Eating Plan  Clinical staff conducted group or individual video education with verbal and written material and guidebook.  Patient learns about the [place] for disease risk reduction. The [place] emphasizes a wide variety of unrefined, minimally-processed carbohydrates, like fruits, vegetables, whole grains, and legumes. Go, Caution, and Stop food  choices are explained. Plant-based and lean animal proteins are emphasized. Rationale provided for low sodium intake for blood pressure control, low added sugars for blood sugar stabilization, and low added fats and oils for coronary artery disease risk reduction and weight management.  Calorie Density  Clinical staff conducted group or individual video education with verbal and written material and guidebook.  Patient learns about calorie density and how it impacts the Pritikin Eating Plan. Knowing the characteristics of the food you choose will help you decide whether those foods will lead to weight gain or weight loss, and whether you want to consume more or less of them. Weight loss is usually a side effect of the Pritikin Eating Plan because of its focus on low calorie-dense foods.  Label Reading  Clinical staff conducted group or individual video education with verbal and written material and guidebook.  Patient learns about the Pritikin recommended label reading guidelines and corresponding recommendations regarding calorie density, added sugars, sodium content, and whole grains.  Dining Out - Part 1  Clinical staff conducted group or individual video education with verbal and written material and guidebook.  Patient learns that restaurant meals can be sabotaging because they can be so high in calories, fat, sodium, and/or sugar. Patient learns recommended strategies on how to positively address this and avoid unhealthy pitfalls.  Facts on Fats  Clinical staff conducted group or individual video education with verbal and written material and guidebook.  Patient learns that lifestyle modifications can be just as effective, if not more so, as many medications for lowering your risk of heart disease. A Pritikin lifestyle can help to reduce your risk of inflammation and atherosclerosis (cholesterol build-up, or plaque, in the artery walls). Lifestyle interventions such as dietary choices and  physical activity address the cause of atherosclerosis. A review of the types of fats and their impact on blood cholesterol levels, along with dietary recommendations to reduce fat intake is also included.  Nutrition Action Plan  Clinical staff conducted group or individual video education with verbal and written material and guidebook.  Patient learns how to incorporate Pritikin recommendations into their lifestyle. Recommendations include planning and keeping personal health goals in mind as an important part of their success.  Healthy Mind-Set    Healthy Minds, Bodies, Hearts  Clinical staff conducted group or individual video education with verbal and written material and guidebook.  Patient learns how to identify when they are stressed. Video will discuss the impact of that stress, as well as the many benefits of stress management. Patient will also be introduced to stress management techniques. The way we think, act, and feel has an impact on our hearts.  How Our Thoughts Can Heal Our Hearts  Clinical staff conducted group or individual video education with verbal and written material and guidebook.  Patient learns that negative thoughts can cause depression and anxiety. This can result in negative lifestyle behavior and serious health problems. Cognitive behavioral therapy is an effective method to help control our thoughts in order to change and improve our emotional outlook.  Additional Videos:  Exercise    Improving Performance  Clinical staff conducted group or individual video education with verbal and written material and guidebook.  Patient learns to use a non-linear approach by alternating intensity levels and lengths of time spent exercising to help burn more calories and lose more body fat. Cardiovascular exercise helps improve heart health, metabolism, hormonal balance, blood sugar control, and recovery from fatigue. Resistance training improves strength, endurance, balance,  coordination, reaction time, metabolism, and muscle mass. Flexibility exercise improves circulation, posture, and balance. Seek guidance from your physician and exercise physiologist before implementing an exercise routine and learn your capabilities and proper form for all exercise.  Introduction to Yoga  Clinical staff conducted group or individual video education with verbal and written material and guidebook.  Patient learns about yoga, a discipline of the coming together of mind, breath, and body. The benefits of yoga include improved flexibility, improved range of motion, better posture and core strength, increased lung function, weight loss, and positive self-image. Yoga's heart health benefits include lowered blood pressure, healthier heart rate, decreased cholesterol and triglyceride levels, improved immune function, and reduced stress. Seek guidance from your physician and exercise physiologist before implementing an exercise routine and learn your capabilities and proper form for all exercise.  Medical   Aging: Enhancing Your Quality of Life  Clinical staff conducted group or individual video education with verbal and written material and guidebook.  Patient learns  key strategies and recommendations to stay in good physical health and enhance quality of life, such as prevention strategies, having an advocate, securing a [place], and keeping a list of medications and system for tracking them. It also discusses how to avoid risk for bone loss.  Biology of Weight Control  Clinical staff conducted group or individual video education with verbal and written material and guidebook.  Patient learns that weight gain occurs because we consume more calories than we burn (eating more, moving less). Even if your body weight is normal, you may have higher ratios of fat compared to muscle mass. Too much body fat puts you at increased risk for cardiovascular disease, heart  attack, stroke, type 2 diabetes, and obesity-related cancers. In addition to exercise, following the [facility] can help reduce your risk.  Decoding Lab Results  Clinical staff conducted group or individual video education with verbal and written material and guidebook.  Patient learns that lab test reflects one measurement whose values change over time and are influenced by many factors, including medication, stress, sleep, exercise, food, hydration, pre-existing medical conditions, and more. It is recommended to use the knowledge from this video to become more involved with your lab results and evaluate your numbers to speak with your doctor.   Diseases of Our Time - Overview  Clinical staff conducted group or individual video education with verbal and written material and guidebook.  Patient learns that according to the CDC, 50% to 70% of chronic diseases (such as obesity, type 2 diabetes, elevated lipids, hypertension, and heart disease) are avoidable through lifestyle improvements including healthier food choices, listening to satiety cues, and increased physical activity.  Sleep Disorders Clinical staff conducted group or individual video education with verbal and written material and guidebook.  Patient learns how good quality and duration of sleep are important to overall health and well-being. Patient also learns about sleep disorders and how they impact health along with recommendations to address them, including discussing with a physician.  Nutrition  Dining Out - Part 2 Clinical staff conducted group or individual video education with verbal and written material and guidebook.  Patient learns how to plan ahead and communicate in order to maximize their dining experience in a healthy and nutritious manner. Included are recommended food choices based on the type of restaurant the patient is visiting.   Fueling a Best boy conducted group or individual  video education with verbal and written material and guidebook.  There is a strong connection between our food choices and our health. Diseases like obesity and type 2 diabetes are very prevalent and are in large-part due to lifestyle choices. The Pritikin Eating Plan provides plenty of food and hunger-curbing satisfaction. It is easy to follow, affordable, and helps reduce health risks.  Menu Workshop  Clinical staff conducted group or individual video education with verbal and written material and guidebook.  Patient learns that restaurant meals can sabotage health goals because they are often packed with calories, fat, sodium, and sugar. Recommendations include strategies to plan ahead and to communicate with the manager, chef, or server to help order a healthier meal.  Planning Your Eating Strategy  Clinical staff conducted group or individual video education with verbal and written material and guidebook.  Patient learns about the [place] and its benefit of reducing the risk of disease. The [place] does not focus on calories. Instead, it emphasizes high-quality, nutrient-rich foods. By  knowing the characteristics of the foods, we choose, we can determine their calorie density and make informed decisions.  Targeting Your Nutrition Priorities  Clinical staff conducted group or individual video education with verbal and written material and guidebook.  Patient learns that lifestyle habits have a tremendous impact on disease risk and progression. This video provides eating and physical activity recommendations based on your personal health goals, such as reducing LDL cholesterol, losing weight, preventing or controlling type 2 diabetes, and reducing high blood pressure.  Vitamins and Minerals  Clinical staff conducted group or individual video education with verbal and written material and guidebook.  Patient learns different ways to obtain key vitamins and minerals,  including through a recommended healthy diet. It is important to discuss all supplements you take with your doctor.   Healthy Mind-Set    Smoking Cessation  Clinical staff conducted group or individual video education with verbal and written material and guidebook.  Patient learns that cigarette smoking and tobacco addiction pose a serious health risk which affects millions of people. Stopping smoking will significantly reduce the risk of heart disease, lung disease, and many forms of cancer. Recommended strategies for quitting are covered, including working with your doctor to develop a successful plan.  Culinary   Becoming a Financial trader conducted group or individual video education with verbal and written material and guidebook.  Patient learns that cooking at home can be healthy, cost-effective, quick, and puts them in control. Keys to cooking healthy recipes will include looking at your recipe, assessing your equipment needs, planning ahead, making it simple, choosing cost-effective seasonal ingredients, and limiting the use of added fats, salts, and sugars.  Cooking - Breakfast and Snacks  Clinical staff conducted group or individual video education with verbal and written material and guidebook.  Patient learns how important breakfast is to satiety and nutrition through the entire day. Recommendations include key foods to eat during breakfast to help stabilize blood sugar levels and to prevent overeating at meals later in the day. Planning ahead is also a key component.  Cooking - Human resources officer conducted group or individual video education with verbal and written material and guidebook.  Patient learns eating strategies to improve overall health, including an approach to cook more at home. Recommendations include thinking of animal protein as a side on your plate rather than center stage and focusing instead on lower calorie dense options like vegetables,  fruits, whole grains, and plant-based proteins, such as beans. Making sauces in large quantities to freeze for later and leaving the skin on your vegetables are also recommended to maximize your experience.  Cooking - Healthy Salads and Dressing Clinical staff conducted group or individual video education with verbal and written material and guidebook.  Patient learns that vegetables, fruits, whole grains, and legumes are the foundations of the [place]. Recommendations include how to incorporate each of these in flavorful and healthy salads, and how to create homemade salad dressings. Proper handling of ingredients is also covered. Cooking - Soups and [employer] - Soups and Desserts Clinical staff conducted group or individual video education with verbal and written material and guidebook.  Patient learns that Pritikin soups and desserts make for easy, nutritious, and delicious snacks and meal components that are low in sodium, fat, sugar, and calorie density, while high in vitamins, minerals, and filling fiber. Recommendations include simple and healthy ideas for soups and desserts.   Overview  The Pritikin Solution Program Overview Clinical staff conducted group or individual video education with verbal and written material and guidebook.  Patient learns that the results of the [place] Program have been documented in more than 100 articles published in peer-reviewed journals, and the benefits include reducing risk factors for (and, in some cases, even reversing) high cholesterol, high blood pressure, type 2 diabetes, obesity, and more! An overview of the three key pillars of the Pritikin Program will be covered: eating well, doing regular exercise, and having a healthy mind-set.  WORKSHOPS  Exercise: Exercise Basics: Building Your Action Plan Clinical staff led group instruction and group discussion with PowerPoint presentation and patient guidebook. To enhance the  learning environment the use of posters, models and videos may be added. At the conclusion of this workshop, patients will comprehend the difference between physical activity and exercise, as well as the benefits of incorporating both, into their routine. Patients will understand the FITT (Frequency, Intensity, Time, and Type) principle and how to use it to build an exercise action plan. In addition, safety concerns and other considerations for exercise and cardiac rehab will be addressed by the presenter. The purpose of this lesson is to promote a comprehensive and effective weekly exercise routine in order to improve patients' overall level of fitness.   Managing Heart Disease: Your Path to a Healthier Heart Clinical staff led group instruction and group discussion with PowerPoint presentation and patient guidebook. To enhance the learning environment the use of posters, models and videos may be added.At the conclusion of this workshop, patients will understand the anatomy and physiology of the heart. Additionally, they will understand how Pritikin's three pillars impact the risk factors, the progression, and the management of heart disease.  The purpose of this lesson is to provide a high-level overview of the heart, heart disease, and how the Pritikin lifestyle positively impacts risk factors.  Exercise Biomechanics Clinical staff led group instruction and group discussion with PowerPoint presentation and patient guidebook. To enhance the learning environment the use of posters, models and videos may be added. Patients will learn how the structural parts of their bodies function and how these functions impact their daily activities, movement, and exercise. Patients will learn how to promote a neutral spine, learn how to manage pain, and identify ways to improve their physical movement in order to promote healthy living. The purpose of this lesson is to expose patients to common  physical limitations that impact physical activity. Participants will learn practical ways to adapt and manage aches and pains, and to minimize their effect on regular exercise. Patients will learn how to maintain good posture while sitting, walking, and lifting.  Balance Training and Fall Prevention  Clinical staff led group instruction and group discussion with PowerPoint presentation and patient guidebook. To enhance the learning environment the use of posters, models and videos may be added. At the conclusion of this workshop, patients will understand the importance of their sensorimotor skills (vision, proprioception, and the vestibular system) in maintaining their ability to balance as they age. Patients will apply a variety of balancing exercises that are appropriate for their current level of function. Patients will understand the common causes for poor balance, possible solutions to these problems, and ways to modify their physical environment in order to minimize their fall risk. The purpose of this lesson is to teach patients about the importance of maintaining balance as they age and ways to minimize their risk of falling.  WORKSHOPS   Nutrition:  Fueling  a Healthy Body Clinical staff led group instruction and group discussion with PowerPoint presentation and patient guidebook. To enhance the learning environment the use of posters, models and videos may be added. Patients will review the foundational principles of the [place] and understand what constitutes a serving size in each of the food groups. Patients will also learn Pritikin-friendly foods that are better choices when away from home and review make-ahead meal and snack options. Calorie density will be reviewed and applied to three nutrition priorities: weight maintenance, weight loss, and weight gain. The purpose of this lesson is to reinforce (in a group setting) the key concepts around what patients are  recommended to eat and how to apply these guidelines when away from home by planning and selecting Pritikin-friendly options. Patients will understand how calorie density may be adjusted for different weight management goals.  Mindful Eating  Clinical staff led group instruction and group discussion with PowerPoint presentation and patient guidebook. To enhance the learning environment the use of posters, models and videos may be added. Patients will briefly review the concepts of the [place] and the importance of low-calorie dense foods. The concept of mindful eating will be introduced as well as the importance of paying attention to internal hunger signals. Triggers for non-hunger eating and techniques for dealing with triggers will be explored. The purpose of this lesson is to provide patients with the opportunity to review the basic principles of the [place], discuss the value of eating mindfully and how to measure internal cues of hunger and fullness using the Hunger Scale. Patients will also discuss reasons for non-hunger eating and learn strategies to use for controlling emotional eating.  Targeting Your Nutrition Priorities Clinical staff led group instruction and group discussion with PowerPoint presentation and patient guidebook. To enhance the learning environment the use of posters, models and videos may be added. Patients will learn how to determine their genetic susceptibility to disease by reviewing their family history. Patients will gain insight into the importance of diet as part of an overall healthy lifestyle in mitigating the impact of genetics and other environmental insults. The purpose of this lesson is to provide patients with the opportunity to assess their personal nutrition priorities by looking at their family history, their own health history and current risk factors. Patients will also be able to discuss ways of prioritizing and modifying the [place] for their highest risk areas  Menu  Clinical staff led group instruction and group discussion with PowerPoint presentation and patient guidebook. To enhance the learning environment the use of posters, models and videos may be added. Using menus brought in from [employer], or printed from [employer], patients will apply the [place] dining out guidelines that were presented in the [employer] video. Patients will also be able to practice these guidelines in a variety of provided scenarios. The purpose of this lesson is to provide patients with the opportunity to practice hands-on learning of the [place] with actual menus and practice scenarios.  Label Reading Clinical staff led group instruction and group discussion with PowerPoint presentation and patient guidebook. To enhance the learning environment the use of posters, models and videos may be added. Patients will review and discuss the Pritikin label reading guidelines presented in Pritikin's Label Reading Educational series video. Using fool labels brought in from local grocery stores and markets, patients will apply the label reading guidelines and determine if the packaged food  meet the Pritikin guidelines. The purpose of this lesson is to provide patients with the opportunity to review, discuss, and practice hands-on learning of the Pritikin Label Reading guidelines with actual packaged food labels. [place] Workshops are designed to teach patients ways to prepare quick, simple, and affordable recipes at home. The importance of nutrition's role in chronic disease risk reduction is reflected in its emphasis in the overall Pritikin program. By learning how to prepare essential core Pritikin Eating Plan recipes, patients will increase control over what they eat; be able to customize the flavor of foods without the use of added salt, sugar, or fat; and  improve the quality of the food they consume. By learning a set of core recipes which are easily assembled, quickly prepared, and affordable, patients are more likely to prepare more healthy foods at home. These workshops focus on convenient breakfasts, simple entres, side dishes, and desserts which can be prepared with minimal effort and are consistent with nutrition recommendations for cardiovascular risk reduction. Cooking [employer] are taught by a Engineer, materials (RD) who has been trained by the [employer]. The chef or RD has a clear understanding of the importance of minimizing - if not completely eliminating - added fat, sugar, and sodium in recipes. Throughout the series of [employer] sessions, patients will learn about healthy ingredients and efficient methods of cooking to build confidence in their capability to prepare    Cooking School weekly topics:  Adding Flavor- Sodium-Free  Fast and Healthy Breakfasts  Powerhouse Plant-Based Proteins  Satisfying Salads and Dressings  Simple Sides and Sauces  International Cuisine-Spotlight on the [place] Zones  Delicious Desserts  Savory Soups  Efficiency Cooking - Meals in a Snap  Tasty Appetizers and Snacks  Comforting Weekend Breakfasts  One-Pot Wonders   Fast Evening Meals  [facility] (Psychosocial): New Thoughts, New Behaviors Clinical staff led group instruction and group discussion with PowerPoint presentation and patient guidebook. To enhance the learning environment the use of posters, models and videos may be added. Patients will learn and practice techniques for developing effective health and lifestyle goals. Patients will be able to effectively apply the goal setting process learned to develop at least one new personal goal.  The purpose of this lesson is to expose patients to a new skill set of behavior  modification techniques such as techniques setting SMART goals, overcoming barriers, and achieving new thoughts and new behaviors.  Managing Moods and Relationships Clinical staff led group instruction and group discussion with PowerPoint presentation and patient guidebook. To enhance the learning environment the use of posters, models and videos may be added. Patients will learn how emotional and chronic stress factors can impact their health and relationships. They will learn healthy ways to manage their moods and utilize positive coping mechanisms. In addition, [facility] patients will learn ways to improve communication skills. The purpose of this lesson is to expose patients to ways of understanding how one's mood and health are intimately connected. Developing a healthy outlook can help build positive relationships and connections with others. Patients will understand the importance of utilizing effective communication skills that include actively listening and being heard. They will learn and understand the importance of the "4 Cs" and especially Connections in fostering of a Healthy Mind-Set.  Healthy Sleep for a Healthy Heart Clinical staff led group instruction and group discussion with PowerPoint presentation and  patient guidebook. To enhance the learning environment the use of posters, models and videos may be added. At the conclusion of this workshop, patients will be able to demonstrate knowledge of the importance of sleep to overall health, well-being, and quality of life. They will understand the symptoms of, and treatments for, common sleep disorders. Patients will also be able to identify daytime and nighttime behaviors which impact sleep, and they will be able to apply these tools to help manage sleep-related challenges. The purpose of this lesson is to provide patients with a general overview of sleep and outline the importance of quality sleep. Patients will learn about a few of the most common  sleep disorders. Patients will also be introduced to the concept of "sleep hygiene," and discover ways to self-manage certain sleeping problems through simple daily behavior changes. Finally, the workshop will motivate patients by clarifying the links between quality sleep and their goals of heart-healthy living.   Recognizing and Reducing Stress Clinical staff led group instruction and group discussion with PowerPoint presentation and patient guidebook. To enhance the learning environment the use of posters, models and videos may be added. At the conclusion of this workshop, patients will be able to understand the types of stress reactions, differentiate between acute and chronic stress, and recognize the impact that chronic stress has on their health. They will also be able to apply different coping mechanisms, such as reframing negative self-talk. Patients will have the opportunity to practice a variety of stress management techniques, such as deep abdominal breathing, progressive muscle relaxation, and/or guided imagery.  The purpose of this lesson is to educate patients on the role of stress in their lives and to provide healthy techniques for coping with it.  Learning Barriers/Preferences:  Learning Barriers/Preferences - 04/01/22 1601       Learning Barriers/Preferences   Learning Barriers Sight   pt wears reading glasses   Learning Preferences Audio;Computer/Internet;Group Instruction;Individual Instruction;Pictoral;Skilled Demonstration;Verbal Instruction;Video;Written Material             Education Topics:  Knowledge Questionnaire Score:  Knowledge Questionnaire Score - 04/01/22 1552       Knowledge Questionnaire Score   Pre Score 21/28             Core Components/Risk Factors/Patient Goals at Admission:  Personal Goals and Risk Factors at Admission - 04/01/22 1602       Core Components/Risk Factors/Patient Goals on Admission    Weight Management Yes;Weight  Maintenance   Simultaneous filing. User may not have seen previous data.   Intervention Weight Management: Develop a combined nutrition and exercise program designed to reach desired caloric intake, while maintaining appropriate intake of nutrient and fiber, sodium and fats, and appropriate energy expenditure required for the weight goal.;Weight Management: Provide education and appropriate resources to help participant work on and attain dietary goals.    Expected Outcomes Short Term: Continue to assess and modify interventions until short term weight is achieved;Long Term: Adherence to nutrition and physical activity/exercise program aimed toward attainment of established weight goal;Weight Maintenance: Understanding of the daily nutrition guidelines, which includes 25-35% calories from fat, 7% or less cal from saturated fats, less than '200mg'$  cholesterol, less than 1.5gm of sodium, & 5 or more servings of fruits and vegetables daily;Understanding recommendations for meals to include 15-35% energy as protein, 25-35% energy from fat, 35-60% energy from carbohydrates, less than '200mg'$  of dietary cholesterol, 20-35 gm of total fiber daily;Understanding of distribution of calorie intake throughout the day with the consumption of  4-5 meals/snacks    Heart Failure Yes    Intervention Provide a combined exercise and nutrition program that is supplemented with education, support and counseling about heart failure. Directed toward relieving symptoms such as shortness of breath, decreased exercise tolerance, and extremity edema.    Expected Outcomes Improve functional capacity of life;Short term: Attendance in program 2-3 days a week with increased exercise capacity. Reported lower sodium intake. Reported increased fruit and vegetable intake. Reports medication compliance.;Short term: Daily weights obtained and reported for increase. Utilizing diuretic protocols set by physician.;Long term: Adoption of self-care skills  and reduction of barriers for early signs and symptoms recognition and intervention leading to self-care maintenance.    Hypertension Yes    Intervention Provide education on lifestyle modifcations including regular physical activity/exercise, weight management, moderate sodium restriction and increased consumption of fresh fruit, vegetables, and low fat dairy, alcohol moderation, and smoking cessation.;Monitor prescription use compliance.    Expected Outcomes Short Term: Continued assessment and intervention until BP is < 140/66m HG in hypertensive participants. < 130/858mHG in hypertensive participants with diabetes, heart failure or chronic kidney disease.;Long Term: Maintenance of blood pressure at goal levels.    Lipids Yes    Intervention Provide education and support for participant on nutrition & aerobic/resistive exercise along with prescribed medications to achieve LDL '70mg'$ , HDL >'40mg'$ .    Expected Outcomes Short Term: Participant states understanding of desired cholesterol values and is compliant with medications prescribed. Participant is following exercise prescription and nutrition guidelines.;Long Term: Cholesterol controlled with medications as prescribed, with individualized exercise RX and with personalized nutrition plan. Value goals: LDL < '70mg'$ , HDL > 40 mg.    Stress Yes    Intervention Offer individual and/or small group education and counseling on adjustment to heart disease, stress management and health-related lifestyle change. Teach and support self-help strategies.;Refer participants experiencing significant psychosocial distress to appropriate mental health specialists for further evaluation and treatment. When possible, include family members and significant others in education/counseling sessions.    Expected Outcomes Long Term: Emotional wellbeing is indicated by absence of clinically significant psychosocial distress or social isolation.;Short Term: Participant demonstrates  changes in health-related behavior, relaxation and other stress management skills, ability to obtain effective social support, and compliance with psychotropic medications if prescribed.             Core Components/Risk Factors/Patient Goals Review:   Goals and Risk Factor Review     Row Name 04/07/22 0840 04/20/22 1452           Core Components/Risk Factors/Patient Goals Review   Personal Goals Review Weight Management/Obesity;Hypertension;Heart Failure;Lipids Weight Management/Obesity;Hypertension;Heart Failure;Lipids      Review RoShreyastarted  intensive cardiac rehab on 04/05/22. RoArtemid well with exercise. Vital signs were stable RoJoshuas been doing  well with exercise. Vital signs have been stable.      Expected Outcomes RoRagnarill continue to participate in intensive cardiac rehab for exercise, nutrition and lifestyle modifications RoCyrusill continue to participate in intensive cardiac rehab for exercise, nutrition and lifestyle modifications               Core Components/Risk Factors/Patient Goals at Discharge (Final Review):   Goals and Risk Factor Review - 04/20/22 1452       Core Components/Risk Factors/Patient Goals Review   Personal Goals Review Weight Management/Obesity;Hypertension;Heart Failure;Lipids    Review RoAlaminas been doing  well with exercise. Vital signs have been stable.    Expected Outcomes RoJerrisill continue to  participate in intensive cardiac rehab for exercise, nutrition and lifestyle modifications             ITP Comments:  ITP Comments     Row Name 04/01/22 1350 04/05/22 1418 04/20/22 1433       ITP Comments Dr Fransico Him MD, Medical Director Introduction to [facility]. Intensive Cardiac Rehab. Initial Orientation Packet Reviewed with the Patient. 30 Day ITP Review. Geovannie started intensive cardiac rehab on 04/05/22. Jashon did well with exercise. 30 Day ITP Review. Shine has good attendance and participation in intensive cardiac rehab.               Comments: See ITP Comments

## 2022-04-21 ENCOUNTER — Encounter (HOSPITAL_COMMUNITY)
Admission: RE | Admit: 2022-04-21 | Discharge: 2022-04-21 | Disposition: A | Discharge status: Home / Self Care | Payer: Medicare Other | Source: Ambulatory Visit | Attending: Internal Medicine | Admitting: Internal Medicine

## 2022-04-21 DIAGNOSIS — I5022 Chronic systolic (congestive) heart failure: Secondary | ICD-10-CM

## 2022-04-21 NOTE — Progress Notes (Signed)
Troy May's weight is up 1.4 kg today.  Weight 88.9 kg, Wednesday weight was 87.5 kg. Troy May denies shortness of breath. Upon assessment lung fields essentially clear. A coarse breath sound was noted in the left upper posterior field. No peripheral edema noted. Oxygen saturation 97% on room air. Will notify the CHF clinic. Troy May said that he ate a big salad and doe not think he needs to take his PRN diuretic at this time.Will continue to monitor the patient throughout  the program.Troy May Venetia Maxon, RN,BSN 04/21/2022 11:54 AM

## 2022-04-23 ENCOUNTER — Encounter (HOSPITAL_COMMUNITY)
Admission: RE | Admit: 2022-04-23 | Discharge: 2022-04-23 | Disposition: A | Discharge status: Home / Self Care | Payer: Medicare Other | Source: Ambulatory Visit | Attending: Internal Medicine | Admitting: Internal Medicine

## 2022-04-23 DIAGNOSIS — I5022 Chronic systolic (congestive) heart failure: Secondary | ICD-10-CM

## 2022-04-26 ENCOUNTER — Encounter (HOSPITAL_COMMUNITY)
Admission: RE | Admit: 2022-04-26 | Discharge: 2022-04-26 | Disposition: A | Discharge status: Home / Self Care | Payer: Medicare Other | Source: Ambulatory Visit | Attending: Internal Medicine | Admitting: Internal Medicine

## 2022-04-26 DIAGNOSIS — I5022 Chronic systolic (congestive) heart failure: Secondary | ICD-10-CM | POA: Diagnosis not present

## 2022-04-28 ENCOUNTER — Encounter (HOSPITAL_COMMUNITY)
Admission: RE | Admit: 2022-04-28 | Discharge: 2022-04-28 | Disposition: A | Discharge status: Home / Self Care | Payer: Medicare Other | Source: Ambulatory Visit | Attending: Internal Medicine | Admitting: Internal Medicine

## 2022-04-28 DIAGNOSIS — I5022 Chronic systolic (congestive) heart failure: Secondary | ICD-10-CM

## 2022-04-30 ENCOUNTER — Encounter (HOSPITAL_COMMUNITY)
Admission: RE | Admit: 2022-04-30 | Discharge: 2022-04-30 | Disposition: A | Discharge status: Home / Self Care | Payer: Medicare Other | Source: Ambulatory Visit | Attending: Internal Medicine | Admitting: Internal Medicine

## 2022-04-30 DIAGNOSIS — I110 Hypertensive heart disease with heart failure: Secondary | ICD-10-CM | POA: Insufficient documentation

## 2022-04-30 DIAGNOSIS — I5022 Chronic systolic (congestive) heart failure: Secondary | ICD-10-CM | POA: Insufficient documentation

## 2022-05-05 ENCOUNTER — Encounter (HOSPITAL_COMMUNITY)
Admission: RE | Admit: 2022-05-05 | Discharge: 2022-05-05 | Disposition: A | Discharge status: Home / Self Care | Payer: Medicare Other | Source: Ambulatory Visit | Attending: Internal Medicine | Admitting: Internal Medicine

## 2022-05-05 DIAGNOSIS — I5022 Chronic systolic (congestive) heart failure: Secondary | ICD-10-CM

## 2022-05-07 ENCOUNTER — Encounter (HOSPITAL_COMMUNITY)
Admission: RE | Admit: 2022-05-07 | Discharge: 2022-05-07 | Disposition: A | Discharge status: Home / Self Care | Payer: Medicare Other | Source: Ambulatory Visit | Attending: Internal Medicine | Admitting: Internal Medicine

## 2022-05-07 DIAGNOSIS — I5022 Chronic systolic (congestive) heart failure: Secondary | ICD-10-CM | POA: Diagnosis not present

## 2022-05-10 ENCOUNTER — Encounter (HOSPITAL_COMMUNITY)
Admission: RE | Admit: 2022-05-10 | Discharge: 2022-05-10 | Disposition: A | Discharge status: Home / Self Care | Payer: Medicare Other | Source: Ambulatory Visit | Attending: Internal Medicine | Admitting: Internal Medicine

## 2022-05-10 DIAGNOSIS — I5022 Chronic systolic (congestive) heart failure: Secondary | ICD-10-CM | POA: Diagnosis not present

## 2022-05-12 ENCOUNTER — Encounter (HOSPITAL_COMMUNITY)
Admission: RE | Admit: 2022-05-12 | Discharge: 2022-05-12 | Disposition: A | Discharge status: Home / Self Care | Payer: Medicare Other | Source: Ambulatory Visit | Attending: Internal Medicine | Admitting: Internal Medicine

## 2022-05-12 DIAGNOSIS — I5022 Chronic systolic (congestive) heart failure: Secondary | ICD-10-CM | POA: Diagnosis not present

## 2022-05-14 ENCOUNTER — Encounter (HOSPITAL_COMMUNITY)
Admission: RE | Admit: 2022-05-14 | Discharge: 2022-05-14 | Disposition: A | Discharge status: Home / Self Care | Payer: Medicare Other | Source: Ambulatory Visit | Attending: Internal Medicine | Admitting: Internal Medicine

## 2022-05-14 DIAGNOSIS — I5022 Chronic systolic (congestive) heart failure: Secondary | ICD-10-CM | POA: Diagnosis not present

## 2022-05-17 ENCOUNTER — Encounter (HOSPITAL_COMMUNITY)
Admission: RE | Admit: 2022-05-17 | Discharge: 2022-05-17 | Disposition: A | Discharge status: Home / Self Care | Payer: Medicare Other | Source: Ambulatory Visit | Attending: Internal Medicine | Admitting: Internal Medicine

## 2022-05-17 DIAGNOSIS — I5022 Chronic systolic (congestive) heart failure: Secondary | ICD-10-CM | POA: Diagnosis not present

## 2022-05-17 NOTE — Progress Notes (Signed)
Troy May has gained 3.9 kg since starting cardiac rehab on 04/01/22. Karriem denies being short of breath. Upon assessment lung fields clear upon ascultation. No peripheral edema noted. [name] admits to not following a heart healthy diet as his wife is out of town during the week to help watch their grandchildren. Oxygen saturation 97% on room air. Will fax exercise flow sheets to Dr. Clayborne Dana office for review.Barnet Pall, RN,BSN 05/17/2022 11:55 AM

## 2022-05-18 NOTE — Progress Notes (Signed)
Cardiac Individual Treatment Plan  Patient Details  Name: Troy May MRN: [id number] Date of Birth: [birth year] Referring Provider:   Flowsheet Row INTENSIVE CARDIAC REHAB ORIENT from 04/01/2022 in [place]  Referring Provider Glori Bickers, MD       Initial Encounter Date:  [facility] from 04/01/2022 in [place]  Date 04/01/22       Visit Diagnosis: Heart failure, chronic systolic ([place])  Patient's Home Medications on Admission:  Current Outpatient Medications:    aspirin EC 81 MG tablet, Take 1 tablet (81 mg total) by mouth daily. Swallow whole., Disp: 30 tablet, Rfl: 11   atorvastatin (LIPITOR) 80 MG tablet, Take 1 tablet (80 mg total) by mouth daily., Disp: 30 tablet, Rfl: 11   cetirizine (ZYRTEC) 10 MG tablet, Take 10 mg by mouth daily as needed for allergies., Disp: , Rfl:    empagliflozin (JARDIANCE) 10 MG TABS tablet, Take 1 tablet (10 mg total) by mouth daily., Disp: 30 tablet, Rfl: 11   metoprolol succinate (TOPROL XL) 50 MG 24 hr tablet, Take 1 tablet (50 mg total) by mouth at bedtime., Disp: 30 tablet, Rfl: 11   OVER THE COUNTER MEDICATION, Take 1 tablet by mouth daily. gummy, Disp: , Rfl:    rivaroxaban (XARELTO) 20 MG TABS tablet, Take 1 tablet (20 mg total) by mouth daily with supper., Disp: 30 tablet, Rfl: 11   sacubitril-valsartan (ENTRESTO) 97-103 MG, Take 1 tablet by mouth 2 (two) times daily., Disp: 180 tablet, Rfl: 3   spironolactone (ALDACTONE) 25 MG tablet, Take 0.5 tablets (12.5 mg total) by mouth daily., Disp: 45 tablet, Rfl: 1   torsemide (DEMADEX) 20 MG tablet, Take 1 tablet (20 mg total) by mouth daily as needed (fluid retention)., Disp: 30 tablet, Rfl: 6  Past Medical History: Past Medical History:  Diagnosis Date   Ascending aortic aneurysm (HCC)    Bilateral renal masses    CAD (coronary artery disease)    CHF (congestive heart failure) (HCC)     HLD (hyperlipidemia)    Hypertension    LV (left ventricular) mural thrombus    Systolic heart failure (HCC)     Tobacco Use: Social History   Tobacco Use  Smoking Status Former   Types: Cigarettes   Quit date: 07/27/2021   Years since quitting: 0.8  Smokeless Tobacco Not on file    Labs: Review Flowsheet  More data may exist      Latest Ref Rng & Units 07/25/2021 07/27/2021 07/28/2021 07/29/2021 09/18/2021  Labs for ITP Cardiac and Pulmonary Rehab  Cholestrol 0 - 200 mg/dL 171  - - - 116   LDL (calc) 0 - 99 mg/dL 123  - - - 67   HDL-C >40 mg/dL 39  - - - 40   Trlycerides <150 mg/dL 47  - - - 46   PH, Arterial 7.350 - 7.450 - 7.413  - - -  PCO2 arterial 32.0 - 48.0 mmHg - 36.0  - - -  Bicarbonate 20.0 - 28.0 mmol/L - 26.4  26.6  22.9  - - -  TCO2 22 - 32 mmol/L - '28  28  24  '$ - - -  Acid-base deficit 0.0 - 2.0 mmol/L - 1.0  - - -  O2 Saturation % - 52.3  76.7  54.0  59.0  95.0  66.0  55.4  -    Capillary Blood Glucose: No results found for: "GLUCAP"   Exercise  Target Goals: Exercise Program Goal: Individual exercise prescription set using results from initial 6 min walk test and THRR while considering  patient's activity barriers and safety.   Exercise Prescription Goal: Initial exercise prescription builds to 30-45 minutes a day of aerobic activity, 2-3 days per week.  Home exercise guidelines will be given to patient during program as part of exercise prescription that the participant will acknowledge.  Activity Barriers & Risk Stratification:  Activity Barriers & Cardiac Risk Stratification - 04/01/22 1555       Activity Barriers & Cardiac Risk Stratification   Activity Barriers Decreased Ventricular Function    Cardiac Risk Stratification High   6MWT 3.33 METs, EF 25-30%            6 Minute Walk:  6 Minute Walk     Row Name 04/01/22 1554         6 Minute Walk   Phase Initial     Distance 1525 feet     Walk Time 6 minutes     # of Rest Breaks 0      MPH 2.89     METS 3.33     RPE 9     Perceived Dyspnea  0     VO2 Peak 11.67     Symptoms No     Resting HR 74 bpm     Resting BP 128/80     Resting Oxygen Saturation  97 %     Exercise Oxygen Saturation  during 6 min walk 95 %     Max Ex. HR 90 bpm     Max Ex. BP 160/84     2 Minute Post BP 138/80              Oxygen Initial Assessment:   Oxygen Re-Evaluation:   Oxygen Discharge (Final Oxygen Re-Evaluation):   Initial Exercise Prescription:  Initial Exercise Prescription - 04/01/22 1500       Date of Initial Exercise RX and Referring Provider   Date 04/01/22    Referring Provider Glori Bickers, MD    Expected Discharge Date 06/04/22      Treadmill   MPH 2.5    Grade 0    Minutes 15    METs 3      Arm Ergometer   Level 2.2    Watts 45    RPM 60    Minutes 15    METs 3      Prescription Details   Frequency (times per week) 3    Duration Progress to 30 minutes of continuous aerobic without signs/symptoms of physical distress      Intensity   THRR 40-80% of Max Heartrate 60-119    Ratings of Perceived Exertion 11-13    Perceived Dyspnea 0-4      Progression   Progression Continue progressive overload as per policy without signs/symptoms or physical distress.      Resistance Training   Training Prescription Yes    Weight 4lb    Reps 10-15             Perform Capillary Blood Glucose checks as needed.  Exercise Prescription Changes:   Exercise Prescription Changes     Row Name 04/05/22 1026 04/19/22 1032 05/05/22 1039 05/17/22 1032       Response to Exercise   Blood Pressure (Admit) 134/82 102/62 146/80 146/82    Blood Pressure (Exercise) 142/84 160/80 138/78 158/90    Blood Pressure (Exit) 120/72 118/72 120/78 118/72    Heart  Rate (Admit) 63 bpm 66 bpm 71 bpm 88 bpm    Heart Rate (Exercise) 100 bpm 120 bpm 126 bpm 129 bpm    Heart Rate (Exit) 63 bpm 77 bpm 71 bpm 88 bpm    Rating of Perceived Exertion (Exercise) '12 13 13 13     '$ Symptoms None None None None    Comments Off to a good start with exercise. Increased WL on TM. -- --    Duration Continue with 30 min of aerobic exercise without signs/symptoms of physical distress. Continue with 30 min of aerobic exercise without signs/symptoms of physical distress. Continue with 30 min of aerobic exercise without signs/symptoms of physical distress. Continue with 30 min of aerobic exercise without signs/symptoms of physical distress.    Intensity THRR unchanged THRR unchanged THRR unchanged THRR unchanged      Progression   Progression Continue to progress workloads to maintain intensity without signs/symptoms of physical distress. Continue to progress workloads to maintain intensity without signs/symptoms of physical distress. Continue to progress workloads to maintain intensity without signs/symptoms of physical distress. Continue to progress workloads to maintain intensity without signs/symptoms of physical distress.    Average METs 2.3 3.5 3.5 3.3      Resistance Training   Training Prescription Yes Yes No  Relaxation day, no weights Yes    Weight 4lb 4lb -- 4lb    Reps 10-15 10-15 -- 10-15    Time 10 Minutes 10 Minutes -- 10 Minutes      Interval Training   Interval Training No No No No      Treadmill   MPH 2.5 2.8 2.8 2.8    Grade 0 '4 4 4  '$ 0% for 1st 10 minutes    Minutes '15 15 15 15    '$ METs 2.91 4.69 4.69 4.69      NuStep   Level -- '4 4 4    '$ SPM -- 85 85 85    Minutes -- '15 15 15    '$ METs -- 2.3 2.3 2.1      Arm Ergometer   Level 2.2 -- -- --    Minutes 15 -- -- --    METs 1.8 -- -- --      Home Exercise Plan   Plans to continue exercise at -- Home (comment)  walking, stationary bike Home (comment)  walking, stationary bike Home (comment)  walking, stationary bike    Frequency -- Add 2 additional days to program exercise sessions. Add 2 additional days to program exercise sessions. Add 2 additional days to program exercise sessions.    Initial Home  Exercises Provided -- 04/14/22 04/14/22 04/14/22             Exercise Comments:   Exercise Comments     Row Name 04/05/22 1127 04/14/22 1042 04/19/22 1117 05/05/22 1118 05/17/22 1051   Exercise Comments Patient tolerated 1st session of exericse well without symptoms. Reviewed METs with patient. Reviewed home exercise guidelines and goals with patient. Reviewed METs with patient. Reviewed METs and goals with patient. Reviewed METs with patient.            Exercise Goals and Review:   Exercise Goals     Row Name 04/01/22 1559             Exercise Goals   Increase Physical Activity Yes       Intervention Provide advice, education, support and counseling about physical activity/exercise needs.;Develop an individualized exercise prescription for aerobic and resistive training based  on initial evaluation findings, risk stratification, comorbidities and participant's personal goals.       Expected Outcomes Short Term: Attend rehab on a regular basis to increase amount of physical activity.;Long Term: Add in home exercise to make exercise part of routine and to increase amount of physical activity.;Long Term: Exercising regularly at least 3-5 days a week.       Increase Strength and Stamina Yes       Intervention Provide advice, education, support and counseling about physical activity/exercise needs.;Develop an individualized exercise prescription for aerobic and resistive training based on initial evaluation findings, risk stratification, comorbidities and participant's personal goals.       Expected Outcomes Short Term: Increase workloads from initial exercise prescription for resistance, speed, and METs.;Short Term: Perform resistance training exercises routinely during rehab and add in resistance training at home;Long Term: Improve cardiorespiratory fitness, muscular endurance and strength as measured by increased METs and functional capacity (6MWT)       Able to understand and use  rate of perceived exertion (RPE) scale Yes       Intervention Provide education and explanation on how to use RPE scale       Expected Outcomes Short Term: Able to use RPE daily in rehab to express subjective intensity level;Long Term:  Able to use RPE to guide intensity level when exercising independently       Knowledge and understanding of Target Heart Rate Range (THRR) Yes       Intervention Provide education and explanation of THRR including how the numbers were predicted and where they are located for reference       Expected Outcomes Short Term: Able to state/look up THRR;Short Term: Able to use daily as guideline for intensity in rehab;Long Term: Able to use THRR to govern intensity when exercising independently       Understanding of Exercise Prescription Yes       Intervention Provide education, explanation, and written materials on patient's individual exercise prescription       Expected Outcomes Short Term: Able to explain program exercise prescription;Long Term: Able to explain home exercise prescription to exercise independently                Exercise Goals Re-Evaluation :  Exercise Goals Re-Evaluation     Row Name 04/05/22 1127 04/14/22 1042 05/05/22 1118         Exercise Goal Re-Evaluation   Exercise Goals Review Increase Physical Activity;Able to understand and use rate of perceived exertion (RPE) scale;Increase Strength and Stamina Increase Physical Activity;Able to understand and use rate of perceived exertion (RPE) scale;Increase Strength and Stamina;Understanding of Exercise Prescription;Knowledge and understanding of Target Heart Rate Range (THRR) Increase Physical Activity;Able to understand and use rate of perceived exertion (RPE) scale;Increase Strength and Stamina;Understanding of Exercise Prescription;Knowledge and understanding of Target Heart Rate Range (THRR)     Comments Patient able to understand and use RPE scale appropriately. Reviewed exercise  prescription with patient. Patient is doing yard work and push-ups but no formal exercise. Patient plans to walk and has a stationary bike at home that he can use as his mode of home exercise. Encouraged patient to try and achieve 150 minutes of aerobic exericse, and patient is amenable to this. Patient's goal is to be able to decrease amount of medications he takes with MD approval. Patient is making steady progress with exercise. Patient is riding his stationary bike at home 15 minutes, ~2 days/week.     Expected Outcomes Progress workloads  as tolerated to help improve cardiorespiratory fitness. Patient will walk or ride his staionary bike 30 minutes, at least 2 days/week in addition to exercise at cardiac rehab to achieve 150 minutes of aerobic exercise/week. Continue to progress workloads as tolerated.              Discharge Exercise Prescription (Final Exercise Prescription Changes):  Exercise Prescription Changes - 05/17/22 1032       Response to Exercise   Blood Pressure (Admit) 146/82    Blood Pressure (Exercise) 158/90    Blood Pressure (Exit) 118/72    Heart Rate (Admit) 88 bpm    Heart Rate (Exercise) 129 bpm    Heart Rate (Exit) 88 bpm    Rating of Perceived Exertion (Exercise) 13    Symptoms None    Duration Continue with 30 min of aerobic exercise without signs/symptoms of physical distress.    Intensity THRR unchanged      Progression   Progression Continue to progress workloads to maintain intensity without signs/symptoms of physical distress.    Average METs 3.3      Resistance Training   Training Prescription Yes    Weight 4lb    Reps 10-15    Time 10 Minutes      Interval Training   Interval Training No      Treadmill   MPH 2.8    Grade 4   0% for 1st 10 minutes   Minutes 15    METs 4.69      NuStep   Level 4    SPM 85    Minutes 15    METs 2.1      Home Exercise Plan   Plans to continue exercise at Home (comment)   walking, stationary bike    Frequency Add 2 additional days to program exercise sessions.    Initial Home Exercises Provided 04/14/22             Nutrition:  Target Goals: Understanding of nutrition guidelines, daily intake of sodium '1500mg'$ , cholesterol '200mg'$ , calories 30% from fat and 7% or less from saturated fats, daily to have 5 or more servings of fruits and vegetables.  Biometrics:  Pre Biometrics - 04/01/22 1553       Pre Biometrics   Waist Circumference 42 inches    Hip Circumference 43.75 inches    Waist to Hip Ratio 0.96 %    Triceps Skinfold 16 mm    % Body Fat 28.9 %    Grip Strength 38 kg    Flexibility 15.75 in    Single Leg Stand 30 seconds              Nutrition Therapy Plan and Nutrition Goals:  Nutrition Therapy & Goals - 05/05/22 1111       Nutrition Therapy   Diet Heart Healthy Diet    Drug/Food Interactions Statins/Certain Fruits      Personal Nutrition Goals   Nutrition Goal Patient to choose a daily variety of fruits, vegetables, whole grains, lean protein/plant protein, and nonfat dairy as part of heart healthy lifestyle    Personal Goal #2 Patient to limit sodium intake to '1500mg'$  daily    Personal Goal #3 Patient to identify and limit food sources of saturated fat, trans fat, sodium, and refined carbohydrates    Comments Goals in progress. Digby continues to attend the [employer] education courses and reports making many dietary changes including reading food labels for sodium, increased high fiber food choices, and mindfulness of saturated  fat intake      Intervention Plan   Intervention Prescribe, educate and counsel regarding individualized specific dietary modifications aiming towards targeted core components such as weight, hypertension, lipid management, diabetes, heart failure and other comorbidities.;Nutrition handout(s) given to patient.    Expected Outcomes Short Term Goal: Understand basic principles of dietary content, such as calories, fat, sodium,  cholesterol and nutrients.;Long Term Goal: Adherence to prescribed nutrition plan.             Nutrition Assessments:  MEDIFICTS Score Key: ?70 Need to make dietary changes  40-70 Heart Healthy Diet ? 40 Therapeutic Level Cholesterol Diet    Picture Your Plate Scores: <40 Unhealthy dietary pattern with much room for improvement. 41-50 Dietary pattern unlikely to meet recommendations for good health and room for improvement. 51-60 More healthful dietary pattern, with some room for improvement.  >60 Healthy dietary pattern, although there may be some specific behaviors that could be improved.    Nutrition Goals Re-Evaluation:  Nutrition Goals Re-Evaluation     [place] Name 04/05/22 1449 05/05/22 1111           Goals   Current Weight 191 lb 12.8 oz (87 kg) 196 lb 10.4 oz (89.2 kg)      Comment Lipid panel improved. HDL 40, GFR 49, Cr 1.52, AST 43 A1c 5.9, lipid panel WNL, GFR 54; liver enzymes improved      Expected Outcome -- Goals in progress. Finn continues to attend the [employer] education courses and reports making many dietary changes including reading food labels for sodium, increased high fiber food choices, and mindfulness of saturated fat intake               Nutrition Goals Re-Evaluation:  Nutrition Goals Re-Evaluation     [place] Name 04/05/22 1449 05/05/22 1111           Goals   Current Weight 191 lb 12.8 oz (87 kg) 196 lb 10.4 oz (89.2 kg)      Comment Lipid panel improved. HDL 40, GFR 49, Cr 1.52, AST 43 A1c 5.9, lipid panel WNL, GFR 54; liver enzymes improved      Expected Outcome -- Goals in progress. Eliyohu continues to attend the [place] education courses and reports making many dietary changes including reading food labels for sodium, increased high fiber food choices, and mindfulness of saturated fat intake               Nutrition Goals Discharge (Final Nutrition Goals Re-Evaluation):  Nutrition Goals Re-Evaluation - 05/05/22 1111       Goals    Current Weight 196 lb 10.4 oz (89.2 kg)    Comment A1c 5.9, lipid panel WNL, GFR 54; liver enzymes improved    Expected Outcome Goals in progress. Shyheim continues to attend the [employer] education courses and reports making many dietary changes including reading food labels for sodium, increased high fiber food choices, and mindfulness of saturated fat intake             Psychosocial: Target Goals: Acknowledge presence or absence of significant depression and/or stress, maximize coping skills, provide positive support system. Participant is able to verbalize types and ability to use techniques and skills needed for reducing stress and depression.  Initial Review & Psychosocial Screening:  Initial Psych Review & Screening - 04/01/22 1403       Initial Review   Current issues with History of Depression;Current Stress Concerns    Source of Stress Concerns Chronic Illness  Comments Lemonte says he has experienced some depression due to his heart failure diagnosis but denies being depressed currently      [place]? Yes   Kevion has his wife, son who lives in the area daughter who lives in [place] and 4 grand children for support.     Barriers   Psychosocial barriers to participate in program The patient should benefit from training in stress management and relaxation.      Screening Interventions   Interventions Encouraged to exercise    Expected Outcomes Long Term Goal: Stressors or current issues are controlled or eliminated.;Short Term goal: Identification and review with participant of any Quality of Life or Depression concerns found by scoring the questionnaire.;Long Term goal: The participant improves quality of Life and PHQ9 Scores as seen by post scores and/or verbalization of changes             Quality of Life Scores:  Quality of Life - 04/01/22 1554       Quality of Life   Select Quality of Life      Quality of Life Scores   Health/Function Pre  23.07 %    Socioeconomic Pre 23.21 %    Psych/Spiritual Pre 25.71 %    Family Pre 24.17 %    GLOBAL Pre 23.81 %            Scores of 19 and below usually indicate a poorer quality of life in these areas.  A difference of  2-3 points is a clinically meaningful difference.  A difference of 2-3 points in the total score of the Quality of Life Index has been associated with significant improvement in overall quality of life, self-image, physical symptoms, and general health in studies assessing change in quality of life.  PHQ-9: Review Flowsheet       04/01/2022 02/08/2022  Depression screen PHQ 2/9  Decreased Interest 0 0  Down, Depressed, Hopeless 0 0  PHQ - 2 Score 0 0   Interpretation of Total Score  Total Score Depression Severity:  1-4 = Minimal depression, 5-9 = Mild depression, 10-14 = Moderate depression, 15-19 = Moderately severe depression, 20-27 = Severe depression   Psychosocial Evaluation and Intervention:   Psychosocial Re-Evaluation:  Psychosocial Re-Evaluation     [place] Name 04/05/22 1420 04/20/22 1434 05/18/22 0954         Psychosocial Re-Evaluation   Current issues with Current Stress Concerns Current Stress Concerns Current Stress Concerns     Comments Serenity started intensive cardiac rehab on 04/05/22 and did not voice having any increased concerns or stressors. Yair has not  voiced  having any increased concerns or stressors since starting intensive ca Dent has not  voiced  having any increased concerns or stressors since starting intensive cardiac rehab. Khamarion did mention that his wife is out of town during the week to watch their grandchildren.     Expected Outcomes Timoteo will have decreased stressors/ depression upon completion of intensive cardiac rehab Yahia will have decreased stressors/ depression upon completion of intensive cardiac rehab Kishawn will have decreased stressors/ depression upon completion of intensive cardiac rehab     Interventions Stress management  education;Encouraged to attend Cardiac Rehabilitation for the exercise Stress management education;Encouraged to attend Cardiac Rehabilitation for the exercise Stress management education;Encouraged to attend Cardiac Rehabilitation for the exercise     Continue Psychosocial Services  Follow up required by staff Follow up required by staff Follow up required by staff  Initial Review   Source of Stress Concerns Chronic Illness Chronic Illness Chronic Illness     Comments Will continue to monitor and offer support as needed Will continue to monitor and offer support as needed Will continue to monitor and offer support as needed              Psychosocial Discharge (Final Psychosocial Re-Evaluation):  Psychosocial Re-Evaluation - 05/18/22 0954       Psychosocial Re-Evaluation   Current issues with Current Stress Concerns    Comments Altan has not  voiced  having any increased concerns or stressors since starting intensive cardiac rehab. La did mention that his wife is out of town during the week to watch their grandchildren.    Expected Outcomes Amos will have decreased stressors/ depression upon completion of intensive cardiac rehab    Interventions Stress management education;Encouraged to attend Cardiac Rehabilitation for the exercise    Continue Psychosocial Services  Follow up required by staff      Initial Review   Source of Stress Concerns Chronic Illness    Comments Will continue to monitor and offer support as needed             Vocational Rehabilitation: Provide vocational rehab assistance to qualifying candidates.   Vocational Rehab Evaluation & Intervention:  Vocational Rehab - 04/01/22 1353       Initial Vocational Rehab Evaluation & Intervention   Assessment shows need for Vocational Rehabilitation No   Azure is retired and does not need vocational rehab at this time            Education: Education Goals: Education classes will be provided on a weekly  basis, covering required topics. Participant will state understanding/return demonstration of topics presented.    Education     Row Name 04/05/22 1200     Education   Cardiac Education Topics [employer]   Select Core Videos     Core Videos   Instruction Review Code 1- Verbalizes Understanding   Class Start Time 1139   Class Stop Time 1217   Class Time Calculation (min) 38 min    [place] Name 04/07/22 1300     Education   Cardiac Education Topics [employer]   Financial trader   Weekly Topic Fast Evening Meals   Instruction Review Code 1- Verbalizes Understanding   Class Start Time 1137   Class Stop Time 1223   Class Time Calculation (min) 46 min    [place] Name 04/12/22 1600     Education   Cardiac Education Topics [employer]   IT sales professional Nutrition   Nutrition Workshop Fueling a Designer, multimedia   Instruction Review Code 1- Information systems manager   Class Start Time 1150   Class Stop Time 1240   Class Time Calculation (min) 50 min    [place] Name 04/14/22 1200     Education   Cardiac Education Topics [employer]in   Financial trader   Weekly Topic Adding Flavor - Sodium-Free   Instruction Review Code 1- Verbalizes Understanding   Class Start Time 1138   Class Stop Time 1228   Class Time Calculation (min) 50 min    [place] Name 04/16/22 1200     Education   Cardiac Education Topics Pritikin   Administrator, sports     Core Videos  Educator Nurse   Select General Education   General Education Heart Disease Risk Reduction   Instruction Review Code 1- Verbalizes Understanding   Class Start Time 1145   Class Stop Time 1221   Class Time Calculation (min) 36 min    [place] Name 04/19/22 1300     Education   Cardiac Education Topics Pritikin   Select Workshops     Workshops   Educator Exercise Physiologist   Select Psychosocial    Psychosocial Workshop Recognizing and Reducing Stress   Instruction Review Code 1- Verbalizes Understanding   Class Start Time 1150   Class Stop Time 1238   Class Time Calculation (min) 48 min    [place] Name 04/21/22 1200     Education   Cardiac Education Topics Pritikin   Financial trader   Weekly Topic Fast and Healthy Breakfasts   Instruction Review Code 1- Verbalizes Understanding   Class Start Time 1143   Class Stop Time 1223   Class Time Calculation (min) 40 min    [place] Name 04/23/22 1300     Education   Cardiac Education Topics Pritikin   Lexicographer Nutrition   Nutrition Overview of the [place]   Instruction Review Code 1- Verbalizes Understanding   Class Start Time 1150   Class Stop Time 1225   Class Time Calculation (min) 35 min    [place] Name 04/26/22 1200     Education   Cardiac Education Topics Pritikin   Select Workshops     Workshops   Educator Exercise Physiologist   Select Exercise   Exercise Workshop Hotel manager and Fall Prevention   Instruction Review Code 1- Verbalizes Understanding   Class Start Time 1145   Class Stop Time 1229   Class Time Calculation (min) 44 min    [place] Name 04/28/22 1300     Education   Cardiac Education Topics Pritikin   Financial trader   Weekly Topic Personalizing Your Pritikin Plate   Instruction Review Code 1- Verbalizes Understanding   Class Start Time 1145   Class Stop Time 1216   Class Time Calculation (min) 31 min    [place] Name 04/30/22 1200     Education   Cardiac Education Topics Pritikin   Scientist, research (life sciences)   Educator Nurse   Select Psychosocial   Psychosocial Healthy Minds, Bodies, Hearts   Instruction Review Code 1- Verbalizes Understanding   Class Start Time 1145   Class Stop Time 1221   Class Time Calculation (min) 36  min    [place] Name 05/05/22 1300     Education   Cardiac Education Topics Pritikin   Financial trader   Weekly Topic Tasty Appetizers and Snacks   Instruction Review Code 1- Verbalizes Understanding   Class Start Time 1144   Class Stop Time 1227   Class Time Calculation (min) 43 min    [place] Name 05/07/22 1400     Education   Cardiac Education Topics Pritikin   Lexicographer Nutrition   Nutrition Other  Label Reading   Instruction Review Code 1- Verbalizes Understanding   Class Start Time  1145   Class Stop Time 1240   Class Time Calculation (min) 55 min    [place] Name 05/10/22 1400     Education   Cardiac Education Topics Pritikin   Scientist, research (life sciences)   Educator Dietitian   Select Nutrition   Nutrition Calorie Density   Instruction Review Code 1- Verbalizes Understanding   Class Start Time 1140   Class Stop Time 1230   Class Time Calculation (min) 50 min    [place] Name 05/12/22 1200     Education   Cardiac Education Topics Pritikin   Financial trader   Weekly Topic [employer] Desserts   Instruction Review Code 1- Verbalizes Understanding   Class Start Time 1130   Class Stop Time 1205   Class Time Calculation (min) 35 min    [place] Name 05/14/22 1200     Education   Cardiac Education Topics Pritikin   Environmental consultant Psychosocial   Psychosocial Workshop Managing Moods and Relationships   Instruction Review Code 1- Verbalizes Understanding   Class Start Time 1140   Class Stop Time 1230   Class Time Calculation (min) 50 min    [place] Name 05/17/22 1300     Education   Cardiac Education Topics [place]   Environmental consultant Exercise   Exercise Workshop Exercise Basics: Recruitment consultant   Instruction Review Code 1- Verbalizes Understanding   Class Start Time 1140   Class Stop Time 1224   Class Time Calculation (min) 44 min            Core Videos: Exercise    Move It!  Clinical staff conducted group or individual video education with verbal and written material and guidebook.  Patient learns the recommended Pritikin exercise program. Exercise with the goal of living a long, healthy life. Some of the health benefits of exercise include controlled diabetes, healthier blood pressure levels, improved cholesterol levels, improved heart and lung capacity, improved sleep, and better body composition. Everyone should speak with their doctor before starting or changing an exercise routine.  Biomechanical Limitations Clinical staff conducted group or individual video education with verbal and written material and guidebook.  Patient learns how biomechanical limitations can impact exercise and how we can mitigate and possibly overcome limitations to have an impactful and balanced exercise routine.  Body Composition Clinical staff conducted group or individual video education with verbal and written material and guidebook.  Patient learns that body composition (ratio of muscle mass to fat mass) is a key component to assessing overall fitness, rather than body weight alone. Increased fat mass, especially visceral belly fat, can put [place] at increased risk for metabolic syndrome, type 2 diabetes, heart disease, and even death. It is recommended to combine diet and exercise (cardiovascular and resistance training) to improve your body composition. Seek guidance from your physician and exercise physiologist before implementing an exercise routine.  Exercise Action Plan Clinical staff conducted group or individual video education with verbal and written material and guidebook.  Patient learns the recommended strategies to achieve and enjoy long-term exercise adherence, including  variety, self-motivation, self-efficacy, and positive decision making. Benefits of exercise include fitness, good health, weight management, more energy, better sleep, less stress, and overall well-being.  Medical   Heart Disease  Risk Reduction Clinical staff conducted group or individual video education with verbal and written material and guidebook.  Patient learns our heart is our most vital organ as it circulates oxygen, nutrients, white blood cells, and hormones throughout the entire body, and carries waste away. Data supports a plant-based eating plan like the Pritikin Program for its effectiveness in slowing progression of and reversing heart disease. The video provides a number of recommendations to address heart disease.   Metabolic Syndrome and Belly Fat  Clinical staff conducted group or individual video education with verbal and written material and guidebook.  Patient learns what metabolic syndrome is, how it leads to heart disease, and how one can reverse it and keep it from coming back. You have metabolic syndrome if you have 3 of the following 5 criteria: abdominal obesity, high blood pressure, high triglycerides, low HDL cholesterol, and high blood sugar.  Hypertension and Heart Disease Clinical staff conducted group or individual video education with verbal and written material and guidebook.  Patient learns that high blood pressure, or hypertension, is very common in the [place]. Hypertension is largely due to excessive salt intake, but other important risk factors include being overweight, physical inactivity, drinking too much alcohol, smoking, and not eating enough potassium from fruits and vegetables. High blood pressure is a leading risk factor for heart attack, stroke, congestive heart failure, dementia, kidney failure, and premature death. Long-term effects of excessive salt intake include stiffening of the arteries and thickening of heart muscle and organ damage.  Recommendations include ways to reduce hypertension and the risk of heart disease.  Diseases of Our Time - Focusing on Diabetes Clinical staff conducted group or individual video education with verbal and written material and guidebook.  Patient learns why the best way to stop diseases of our time is prevention, through food and other lifestyle changes. Medicine (such as prescription pills and surgeries) is often only a Band-Aid on the problem, not a long-term solution. Most common diseases of our time include obesity, type 2 diabetes, hypertension, heart disease, and cancer. The Pritikin Program is recommended and has been proven to help reduce, reverse, and/or prevent the damaging effects of metabolic syndrome.  Nutrition   Overview of the Pritikin Eating Plan  Clinical staff conducted group or individual video education with verbal and written material and guidebook.  Patient learns about the [place] for disease risk reduction. The [place] emphasizes a wide variety of unrefined, minimally-processed carbohydrates, like fruits, vegetables, whole grains, and legumes. Go, Caution, and Stop food choices are explained. Plant-based and lean animal proteins are emphasized. Rationale provided for low sodium intake for blood pressure control, low added sugars for blood sugar stabilization, and low added fats and oils for coronary artery disease risk reduction and weight management.  Calorie Density  Clinical staff conducted group or individual video education with verbal and written material and guidebook.  Patient learns about calorie density and how it impacts the Pritikin Eating Plan. Knowing the characteristics of the food you choose will help you decide whether those foods will lead to weight gain or weight loss, and whether you want to consume more or less of them. Weight loss is usually a side effect of the Pritikin Eating Plan because of its focus on low calorie-dense  foods.  Label Reading  Clinical staff conducted group or individual video education with verbal and written material and guidebook.  Patient learns about the Pritikin recommended label reading guidelines and corresponding recommendations regarding calorie  density, added sugars, sodium content, and whole grains.  Dining Out - Part 1  Clinical staff conducted group or individual video education with verbal and written material and guidebook.  Patient learns that restaurant meals can be sabotaging because they can be so high in calories, fat, sodium, and/or sugar. Patient learns recommended strategies on how to positively address this and avoid unhealthy pitfalls.  Facts on Fats  Clinical staff conducted group or individual video education with verbal and written material and guidebook.  Patient learns that lifestyle modifications can be just as effective, if not more so, as many medications for lowering your risk of heart disease. A Pritikin lifestyle can help to reduce your risk of inflammation and atherosclerosis (cholesterol build-up, or plaque, in the artery walls). Lifestyle interventions such as dietary choices and physical activity address the cause of atherosclerosis. A review of the types of fats and their impact on blood cholesterol levels, along with dietary recommendations to reduce fat intake is also included.  Nutrition Action Plan  Clinical staff conducted group or individual video education with verbal and written material and guidebook.  Patient learns how to incorporate Pritikin recommendations into their lifestyle. Recommendations include planning and keeping personal health goals in mind as an important part of their success.  Healthy Mind-Set    Healthy Minds, Bodies, Hearts  Clinical staff conducted group or individual video education with verbal and written material and guidebook.  Patient learns how to identify when they are stressed. Video will discuss the impact of that  stress, as well as the many benefits of stress management. Patient will also be introduced to stress management techniques. The way we think, act, and feel has an impact on our hearts.  How Our Thoughts Can Heal Our Hearts  Clinical staff conducted group or individual video education with verbal and written material and guidebook.  Patient learns that negative thoughts can cause depression and anxiety. This can result in negative lifestyle behavior and serious health problems. Cognitive behavioral therapy is an effective method to help control our thoughts in order to change and improve our emotional outlook.  Additional Videos:  Exercise    Improving Performance  Clinical staff conducted group or individual video education with verbal and written material and guidebook.  Patient learns to use a non-linear approach by alternating intensity levels and lengths of time spent exercising to help burn more calories and lose more body fat. Cardiovascular exercise helps improve heart health, metabolism, hormonal balance, blood sugar control, and recovery from fatigue. Resistance training improves strength, endurance, balance, coordination, reaction time, metabolism, and muscle mass. Flexibility exercise improves circulation, posture, and balance. Seek guidance from your physician and exercise physiologist before implementing an exercise routine and learn your capabilities and proper form for all exercise.  Introduction to Yoga  Clinical staff conducted group or individual video education with verbal and written material and guidebook.  Patient learns about yoga, a discipline of the coming together of mind, breath, and body. The benefits of yoga include improved flexibility, improved range of motion, better posture and core strength, increased lung function, weight loss, and positive self-image. Yoga's heart health benefits include lowered blood pressure, healthier heart rate, decreased cholesterol and  triglyceride levels, improved immune function, and reduced stress. Seek guidance from your physician and exercise physiologist before implementing an exercise routine and learn your capabilities and proper form for all exercise.  Medical   Aging: Enhancing Your Quality of Life  Clinical staff conducted group or individual video education with  verbal and written material and guidebook.  Patient learns key strategies and recommendations to stay in good physical health and enhance quality of life, such as prevention strategies, having an advocate, securing a [place], and keeping a list of medications and system for tracking them. It also discusses how to avoid risk for bone loss.  Biology of Weight Control  Clinical staff conducted group or individual video education with verbal and written material and guidebook.  Patient learns that weight gain occurs because we consume more calories than we burn (eating more, moving less). Even if your body weight is normal, you may have higher ratios of fat compared to muscle mass. Too much body fat puts you at increased risk for cardiovascular disease, heart attack, stroke, type 2 diabetes, and obesity-related cancers. In addition to exercise, following the [place] can help reduce your risk.  Decoding Lab Results  Clinical staff conducted group or individual video education with verbal and written material and guidebook.  Patient learns that lab test reflects one measurement whose values change over time and are influenced by many factors, including medication, stress, sleep, exercise, food, hydration, pre-existing medical conditions, and more. It is recommended to use the knowledge from this video to become more involved with your lab results and evaluate your numbers to speak with your doctor.   Diseases of Our Time - Overview  Clinical staff conducted group or individual video education with verbal and written  material and guidebook.  Patient learns that according to the CDC, 50% to 70% of chronic diseases (such as obesity, type 2 diabetes, elevated lipids, hypertension, and heart disease) are avoidable through lifestyle improvements including healthier food choices, listening to satiety cues, and increased physical activity.  Sleep Disorders Clinical staff conducted group or individual video education with verbal and written material and guidebook.  Patient learns how good quality and duration of sleep are important to overall health and well-being. Patient also learns about sleep disorders and how they impact health along with recommendations to address them, including discussing with a physician.  Nutrition  Dining Out - Part 2 Clinical staff conducted group or individual video education with verbal and written material and guidebook.  Patient learns how to plan ahead and communicate in order to maximize their dining experience in a healthy and nutritious manner. Included are recommended food choices based on the type of restaurant the patient is visiting.   Fueling a Best boy conducted group or individual video education with verbal and written material and guidebook.  There is a strong connection between our food choices and our health. Diseases like obesity and type 2 diabetes are very prevalent and are in large-part due to lifestyle choices. The Pritikin Eating Plan provides plenty of food and hunger-curbing satisfaction. It is easy to follow, affordable, and helps reduce health risks.  Menu Workshop  Clinical staff conducted group or individual video education with verbal and written material and guidebook.  Patient learns that restaurant meals can sabotage health goals because they are often packed with calories, fat, sodium, and sugar. Recommendations include strategies to plan ahead and to communicate with the manager, chef, or server to help order a healthier  meal.  Planning Your Eating Strategy  Clinical staff conducted group or individual video education with verbal and written material and guidebook.  Patient learns about the [place] and its benefit of reducing the risk of disease. The [place] does not focus  on calories. Instead, it emphasizes high-quality, nutrient-rich foods. By knowing the characteristics of the foods, we choose, we can determine their calorie density and make informed decisions.  Targeting Your Nutrition Priorities  Clinical staff conducted group or individual video education with verbal and written material and guidebook.  Patient learns that lifestyle habits have a tremendous impact on disease risk and progression. This video provides eating and physical activity recommendations based on your personal health goals, such as reducing LDL cholesterol, losing weight, preventing or controlling type 2 diabetes, and reducing high blood pressure.  Vitamins and Minerals  Clinical staff conducted group or individual video education with verbal and written material and guidebook.  Patient learns different ways to obtain key vitamins and minerals, including through a recommended healthy diet. It is important to discuss all supplements you take with your doctor.   Healthy Mind-Set    Smoking Cessation  Clinical staff conducted group or individual video education with verbal and written material and guidebook.  Patient learns that cigarette smoking and tobacco addiction pose a serious health risk which affects millions of people. Stopping smoking will significantly reduce the risk of heart disease, lung disease, and many forms of cancer. Recommended strategies for quitting are covered, including working with your doctor to develop a successful plan.  Culinary   Becoming a Financial trader conducted group or individual video education with verbal and written material and guidebook.  Patient learns  that cooking at home can be healthy, cost-effective, quick, and puts them in control. Keys to cooking healthy recipes will include looking at your recipe, assessing your equipment needs, planning ahead, making it simple, choosing cost-effective seasonal ingredients, and limiting the use of added fats, salts, and sugars.  Cooking - Breakfast and Snacks  Clinical staff conducted group or individual video education with verbal and written material and guidebook.  Patient learns how important breakfast is to satiety and nutrition through the entire day. Recommendations include key foods to eat during breakfast to help stabilize blood sugar levels and to prevent overeating at meals later in the day. Planning ahead is also a key component.  Cooking - Human resources officer conducted group or individual video education with verbal and written material and guidebook.  Patient learns eating strategies to improve overall health, including an approach to cook more at home. Recommendations include thinking of animal protein as a side on your plate rather than center stage and focusing instead on lower calorie dense options like vegetables, fruits, whole grains, and plant-based proteins, such as beans. Making sauces in large quantities to freeze for later and leaving the skin on your vegetables are also recommended to maximize your experience.  Cooking - Healthy Salads and Dressing Clinical staff conducted group or individual video education with verbal and written material and guidebook.  Patient learns that vegetables, fruits, whole grains, and legumes are the foundations of the [place]. Recommendations include how to incorporate each of these in flavorful and healthy salads, and how to create homemade salad dressings. Proper handling of ingredients is also covered. Cooking - Soups and [employer] - Soups and Desserts Clinical staff conducted group or individual video education with  verbal and written material and guidebook.  Patient learns that Pritikin soups and desserts make for easy, nutritious, and delicious snacks and meal components that are low in sodium, fat, sugar, and calorie density, while high in vitamins, minerals, and filling fiber. Recommendations include simple and healthy ideas for soups  and desserts.   Overview     The Pritikin Solution Program Overview Clinical staff conducted group or individual video education with verbal and written material and guidebook.  Patient learns that the results of the [place] Program have been documented in more than 100 articles published in peer-reviewed journals, and the benefits include reducing risk factors for (and, in some cases, even reversing) high cholesterol, high blood pressure, type 2 diabetes, obesity, and more! An overview of the three key pillars of the Pritikin Program will be covered: eating well, doing regular exercise, and having a healthy mind-set.  WORKSHOPS  Exercise: Exercise Basics: Building Your Action Plan Clinical staff led group instruction and group discussion with PowerPoint presentation and patient guidebook. To enhance the learning environment the use of posters, models and videos may be added. At the conclusion of this workshop, patients will comprehend the difference between physical activity and exercise, as well as the benefits of incorporating both, into their routine. Patients will understand the FITT (Frequency, Intensity, Time, and Type) principle and how to use it to build an exercise action plan. In addition, safety concerns and other considerations for exercise and cardiac rehab will be addressed by the presenter. The purpose of this lesson is to promote a comprehensive and effective weekly exercise routine in order to improve patients' overall level of fitness.   Managing Heart Disease: Your Path to a Healthier Heart Clinical staff led group instruction and group discussion with  PowerPoint presentation and patient guidebook. To enhance the learning environment the use of posters, models and videos may be added.At the conclusion of this workshop, patients will understand the anatomy and physiology of the heart. Additionally, they will understand how Pritikin's three pillars impact the risk factors, the progression, and the management of heart disease.  The purpose of this lesson is to provide a high-level overview of the heart, heart disease, and how the Pritikin lifestyle positively impacts risk factors.  Exercise Biomechanics Clinical staff led group instruction and group discussion with PowerPoint presentation and patient guidebook. To enhance the learning environment the use of posters, models and videos may be added. Patients will learn how the structural parts of their bodies function and how these functions impact their daily activities, movement, and exercise. Patients will learn how to promote a neutral spine, learn how to manage pain, and identify ways to improve their physical movement in order to promote healthy living. The purpose of this lesson is to expose patients to common physical limitations that impact physical activity. Participants will learn practical ways to adapt and manage aches and pains, and to minimize their effect on regular exercise. Patients will learn how to maintain good posture while sitting, walking, and lifting.  Balance Training and Fall Prevention  Clinical staff led group instruction and group discussion with PowerPoint presentation and patient guidebook. To enhance the learning environment the use of posters, models and videos may be added. At the conclusion of this workshop, patients will understand the importance of their sensorimotor skills (vision, proprioception, and the vestibular system) in maintaining their ability to balance as they age. Patients will apply a variety of balancing exercises that are appropriate for their  current level of function. Patients will understand the common causes for poor balance, possible solutions to these problems, and ways to modify their physical environment in order to minimize their fall risk. The purpose of this lesson is to teach patients about the importance of maintaining balance as they age and ways to minimize their risk  of falling.  WORKSHOPS   Nutrition:  Fueling a Scientist, research (physical sciences) led group instruction and group discussion with PowerPoint presentation and patient guidebook. To enhance the learning environment the use of posters, models and videos may be added. Patients will review the foundational principles of the [place] and understand what constitutes a serving size in each of the food groups. Patients will also learn Pritikin-friendly foods that are better choices when away from home and review make-ahead meal and snack options. Calorie density will be reviewed and applied to three nutrition priorities: weight maintenance, weight loss, and weight gain. The purpose of this lesson is to reinforce (in a group setting) the key concepts around what patients are recommended to eat and how to apply these guidelines when away from home by planning and selecting Pritikin-friendly options. Patients will understand how calorie density may be adjusted for different weight management goals.  Mindful Eating  Clinical staff led group instruction and group discussion with PowerPoint presentation and patient guidebook. To enhance the learning environment the use of posters, models and videos may be added. Patients will briefly review the concepts of the [place] and the importance of low-calorie dense foods. The concept of mindful eating will be introduced as well as the importance of paying attention to internal hunger signals. Triggers for non-hunger eating and techniques for dealing with triggers will be explored. The purpose of this lesson is to provide  patients with the opportunity to review the basic principles of the [place], discuss the value of eating mindfully and how to measure internal cues of hunger and fullness using the Hunger Scale. Patients will also discuss reasons for non-hunger eating and learn strategies to use for controlling emotional eating.  Targeting Your Nutrition Priorities Clinical staff led group instruction and group discussion with PowerPoint presentation and patient guidebook. To enhance the learning environment the use of posters, models and videos may be added. Patients will learn how to determine their genetic susceptibility to disease by reviewing their family history. Patients will gain insight into the importance of diet as part of an overall healthy lifestyle in mitigating the impact of genetics and other environmental insults. The purpose of this lesson is to provide patients with the opportunity to assess their personal nutrition priorities by looking at their family history, their own health history and current risk factors. Patients will also be able to discuss ways of prioritizing and modifying the [place] for their highest risk areas  Menu  Clinical staff led group instruction and group discussion with PowerPoint presentation and patient guidebook. To enhance the learning environment the use of posters, models and videos may be added. Using menus brought in from [employer], or printed from [employer], patients will apply the [place] dining out guidelines that were presented in the [employer] video. Patients will also be able to practice these guidelines in a variety of provided scenarios. The purpose of this lesson is to provide patients with the opportunity to practice hands-on learning of the [place] with actual menus and practice scenarios.  Label Reading Clinical staff led group instruction and group discussion with PowerPoint  presentation and patient guidebook. To enhance the learning environment the use of posters, models and videos may be added. Patients will review and discuss the Pritikin label reading guidelines presented in Pritikin's Label Reading Educational series video. Using fool labels brought in from local grocery stores and markets, patients will apply the  label reading guidelines and determine if the packaged food meet the Pritikin guidelines. The purpose of this lesson is to provide patients with the opportunity to review, discuss, and practice hands-on learning of the Pritikin Label Reading guidelines with actual packaged food labels. [place] Workshops are designed to teach patients ways to prepare quick, simple, and affordable recipes at home. The importance of nutrition's role in chronic disease risk reduction is reflected in its emphasis in the overall Pritikin program. By learning how to prepare essential core Pritikin Eating Plan recipes, patients will increase control over what they eat; be able to customize the flavor of foods without the use of added salt, sugar, or fat; and improve the quality of the food they consume. By learning a set of core recipes which are easily assembled, quickly prepared, and affordable, patients are more likely to prepare more healthy foods at home. These workshops focus on convenient breakfasts, simple entres, side dishes, and desserts which can be prepared with minimal effort and are consistent with nutrition recommendations for cardiovascular risk reduction. Cooking [employer] are taught by a Engineer, materials (RD) who has been trained by the [employer]. The chef or RD has a clear understanding of the importance of minimizing - if not completely eliminating - added fat, sugar, and sodium in recipes. Throughout the series of [place] Workshop sessions, patients will learn about healthy ingredients and  efficient methods of cooking to build confidence in their capability to prepare    Cooking School weekly topics:  Adding Flavor- Sodium-Free  Fast and Healthy Breakfasts  Powerhouse Plant-Based Proteins  Satisfying Salads and Dressings  Simple Sides and Sauces  International Cuisine-Spotlight on the [place] Zones  Delicious Desserts  Savory Soups  Efficiency Cooking - Meals in a Snap  Tasty Appetizers and Snacks  Comforting Weekend Breakfasts  One-Pot Wonders   Fast Evening Meals  Easy [place] (Psychosocial): New Thoughts, New Behaviors Clinical staff led group instruction and group discussion with PowerPoint presentation and patient guidebook. To enhance the learning environment the use of posters, models and videos may be added. Patients will learn and practice techniques for developing effective health and lifestyle goals. Patients will be able to effectively apply the goal setting process learned to develop at least one new personal goal.  The purpose of this lesson is to expose patients to a new skill set of behavior modification techniques such as techniques setting SMART goals, overcoming barriers, and achieving new thoughts and new behaviors.  Managing Moods and Relationships Clinical staff led group instruction and group discussion with PowerPoint presentation and patient guidebook. To enhance the learning environment the use of posters, models and videos may be added. Patients will learn how emotional and chronic stress factors can impact their health and relationships. They will learn healthy ways to manage their moods and utilize positive coping mechanisms. In addition, ICR patients will learn ways to improve communication skills. The purpose of this lesson is to expose patients to ways of understanding how one's mood and health are intimately connected. Developing a healthy outlook can help build positive  relationships and connections with others. Patients will understand the importance of utilizing effective communication skills that include actively listening and being heard. They will learn and understand the importance of the "4 Cs" and especially Connections in fostering of a Healthy Mind-Set.  Healthy Sleep for a Healthy Heart Clinical staff led  group instruction and group discussion with PowerPoint presentation and patient guidebook. To enhance the learning environment the use of posters, models and videos may be added. At the conclusion of this workshop, patients will be able to demonstrate knowledge of the importance of sleep to overall health, well-being, and quality of life. They will understand the symptoms of, and treatments for, common sleep disorders. Patients will also be able to identify daytime and nighttime behaviors which impact sleep, and they will be able to apply these tools to help manage sleep-related challenges. The purpose of this lesson is to provide patients with a general overview of sleep and outline the importance of quality sleep. Patients will learn about a few of the most common sleep disorders. Patients will also be introduced to the concept of "sleep hygiene," and discover ways to self-manage certain sleeping problems through simple daily behavior changes. Finally, the workshop will motivate patients by clarifying the links between quality sleep and their goals of heart-healthy living.   Recognizing and Reducing Stress Clinical staff led group instruction and group discussion with PowerPoint presentation and patient guidebook. To enhance the learning environment the use of posters, models and videos may be added. At the conclusion of this workshop, patients will be able to understand the types of stress reactions, differentiate between acute and chronic stress, and recognize the impact that chronic stress has on their health. They will also be able to apply different coping  mechanisms, such as reframing negative self-talk. Patients will have the opportunity to practice a variety of stress management techniques, such as deep abdominal breathing, progressive muscle relaxation, and/or guided imagery.  The purpose of this lesson is to educate patients on the role of stress in their lives and to provide healthy techniques for coping with it.  Learning Barriers/Preferences:  Learning Barriers/Preferences - 04/01/22 1601       Learning Barriers/Preferences   Learning Barriers Sight   pt wears reading glasses   Learning Preferences Audio;Computer/Internet;Group Instruction;Individual Instruction;Pictoral;Skilled Demonstration;Verbal Instruction;Video;Written Material             Education Topics:  Knowledge Questionnaire Score:  Knowledge Questionnaire Score - 04/01/22 1552       Knowledge Questionnaire Score   Pre Score 21/28             Core Components/Risk Factors/Patient Goals at Admission:  Personal Goals and Risk Factors at Admission - 04/01/22 1602       Core Components/Risk Factors/Patient Goals on Admission    Weight Management Yes;Weight Maintenance   Simultaneous filing. User may not have seen previous data.   Intervention Weight Management: Develop a combined nutrition and exercise program designed to reach desired caloric intake, while maintaining appropriate intake of nutrient and fiber, sodium and fats, and appropriate energy expenditure required for the weight goal.;Weight Management: Provide education and appropriate resources to help participant work on and attain dietary goals.    Expected Outcomes Short Term: Continue to assess and modify interventions until short term weight is achieved;Long Term: Adherence to nutrition and physical activity/exercise program aimed toward attainment of established weight goal;Weight Maintenance: Understanding of the daily nutrition guidelines, which includes 25-35% calories from fat, 7% or less cal from  saturated fats, less than '200mg'$  cholesterol, less than 1.5gm of sodium, & 5 or more servings of fruits and vegetables daily;Understanding recommendations for meals to include 15-35% energy as protein, 25-35% energy from fat, 35-60% energy from carbohydrates, less than '200mg'$  of dietary cholesterol, 20-35 gm of total fiber daily;Understanding of distribution of  calorie intake throughout the day with the consumption of 4-5 meals/snacks    Heart Failure Yes    Intervention Provide a combined exercise and nutrition program that is supplemented with education, support and counseling about heart failure. Directed toward relieving symptoms such as shortness of breath, decreased exercise tolerance, and extremity edema.    Expected Outcomes Improve functional capacity of life;Short term: Attendance in program 2-3 days a week with increased exercise capacity. Reported lower sodium intake. Reported increased fruit and vegetable intake. Reports medication compliance.;Short term: Daily weights obtained and reported for increase. Utilizing diuretic protocols set by physician.;Long term: Adoption of self-care skills and reduction of barriers for early signs and symptoms recognition and intervention leading to self-care maintenance.    Hypertension Yes    Intervention Provide education on lifestyle modifcations including regular physical activity/exercise, weight management, moderate sodium restriction and increased consumption of fresh fruit, vegetables, and low fat dairy, alcohol moderation, and smoking cessation.;Monitor prescription use compliance.    Expected Outcomes Short Term: Continued assessment and intervention until BP is < 140/85m HG in hypertensive participants. < 130/880mHG in hypertensive participants with diabetes, heart failure or chronic kidney disease.;Long Term: Maintenance of blood pressure at goal levels.    Lipids Yes    Intervention Provide education and support for participant on nutrition &  aerobic/resistive exercise along with prescribed medications to achieve LDL '70mg'$ , HDL >'40mg'$ .    Expected Outcomes Short Term: Participant states understanding of desired cholesterol values and is compliant with medications prescribed. Participant is following exercise prescription and nutrition guidelines.;Long Term: Cholesterol controlled with medications as prescribed, with individualized exercise RX and with personalized nutrition plan. Value goals: LDL < '70mg'$ , HDL > 40 mg.    Stress Yes    Intervention Offer individual and/or small group education and counseling on adjustment to heart disease, stress management and health-related lifestyle change. Teach and support self-help strategies.;Refer participants experiencing significant psychosocial distress to appropriate mental health specialists for further evaluation and treatment. When possible, include family members and significant others in education/counseling sessions.    Expected Outcomes Long Term: Emotional wellbeing is indicated by absence of clinically significant psychosocial distress or social isolation.;Short Term: Participant demonstrates changes in health-related behavior, relaxation and other stress management skills, ability to obtain effective social support, and compliance with psychotropic medications if prescribed.             Core Components/Risk Factors/Patient Goals Review:   Goals and Risk Factor Review     Row Name 04/07/22 0840 04/20/22 1452 05/18/22 0955         Core Components/Risk Factors/Patient Goals Review   Personal Goals Review Weight Management/Obesity;Hypertension;Heart Failure;Lipids Weight Management/Obesity;Hypertension;Heart Failure;Lipids Weight Management/Obesity;Hypertension;Heart Failure;Lipids     Review RoAdonnistarted  intensive cardiac rehab on 04/05/22. RoSadlerid well with exercise. Vital signs were stable RoNikosas been doing  well with exercise. Vital signs have been stable. RoAadynas been doing  well  with exercise. Vital signs have been stable. RoFrenchieas gained weight since starting the program. The heart failure clinic has been notified. RoPranays asymptomatic and is not following a heart healthy diet like he should.     Expected Outcomes RoBrookill continue to participate in intensive cardiac rehab for exercise, nutrition and lifestyle modifications RoMurryill continue to participate in intensive cardiac rehab for exercise, nutrition and lifestyle modifications RoRomeoill continue to participate in intenive cardiac rehab for exercise, nutrition and lifestyle modifications  Core Components/Risk Factors/Patient Goals at Discharge (Final Review):   Goals and Risk Factor Review - 05/18/22 0955       Core Components/Risk Factors/Patient Goals Review   Personal Goals Review Weight Management/Obesity;Hypertension;Heart Failure;Lipids    Review Nethan has been doing  well with exercise. Vital signs have been stable. Safir has gained weight since starting the program. The heart failure clinic has been notified. Eyal is asymptomatic and is not following a heart healthy diet like he should.    Expected Outcomes Darell will continue to participate in intenive cardiac rehab for exercise, nutrition and lifestyle modifications             ITP Comments:  ITP Comments     Row Name 04/01/22 1350 04/05/22 1418 04/20/22 1433 05/18/22 0953     ITP Comments Dr Fransico Him MD, Medical Director Introduction to [facility]. Intensive Cardiac Rehab. Initial Orientation Packet Reviewed with the Patient. 30 Day ITP Review. Dshawn started intensive cardiac rehab on 04/05/22. Macai did well with exercise. 30 Day ITP Review. Vail has good attendance and participation in intensive cardiac rehab. 30 Day ITP Review. Tandre has good attendance and participation in intensive cardiac rehab. Emric has gained some weight but is enjoyibg exercise.             Comments: See ITP Comments

## 2022-05-19 ENCOUNTER — Encounter (HOSPITAL_COMMUNITY)
Admission: RE | Admit: 2022-05-19 | Discharge: 2022-05-19 | Disposition: A | Discharge status: Home / Self Care | Payer: Medicare Other | Source: Ambulatory Visit | Attending: Internal Medicine | Admitting: Internal Medicine

## 2022-05-19 DIAGNOSIS — I5022 Chronic systolic (congestive) heart failure: Secondary | ICD-10-CM | POA: Diagnosis not present

## 2022-05-21 ENCOUNTER — Encounter (HOSPITAL_COMMUNITY)
Admission: RE | Admit: 2022-05-21 | Discharge: 2022-05-21 | Disposition: A | Discharge status: Home / Self Care | Payer: Medicare Other | Source: Ambulatory Visit | Attending: Internal Medicine | Admitting: Internal Medicine

## 2022-05-21 DIAGNOSIS — I5022 Chronic systolic (congestive) heart failure: Secondary | ICD-10-CM | POA: Diagnosis not present

## 2022-05-24 ENCOUNTER — Encounter (HOSPITAL_COMMUNITY)
Admission: RE | Admit: 2022-05-24 | Discharge: 2022-05-24 | Disposition: A | Discharge status: Home / Self Care | Payer: Medicare Other | Source: Ambulatory Visit | Attending: Internal Medicine | Admitting: Internal Medicine

## 2022-05-24 DIAGNOSIS — I5022 Chronic systolic (congestive) heart failure: Secondary | ICD-10-CM | POA: Diagnosis not present

## 2022-05-26 ENCOUNTER — Encounter (HOSPITAL_COMMUNITY)
Admission: RE | Admit: 2022-05-26 | Discharge: 2022-05-26 | Disposition: A | Discharge status: Home / Self Care | Payer: Medicare Other | Source: Ambulatory Visit | Attending: Internal Medicine | Admitting: Internal Medicine

## 2022-05-26 DIAGNOSIS — I5022 Chronic systolic (congestive) heart failure: Secondary | ICD-10-CM | POA: Diagnosis not present

## 2022-05-28 ENCOUNTER — Encounter (HOSPITAL_COMMUNITY)
Admission: RE | Admit: 2022-05-28 | Discharge: 2022-05-28 | Disposition: A | Discharge status: Home / Self Care | Payer: Medicare Other | Source: Ambulatory Visit | Attending: Internal Medicine | Admitting: Internal Medicine

## 2022-05-28 DIAGNOSIS — I5022 Chronic systolic (congestive) heart failure: Secondary | ICD-10-CM

## 2022-05-31 ENCOUNTER — Encounter (HOSPITAL_COMMUNITY)
Admission: RE | Admit: 2022-05-31 | Discharge: 2022-05-31 | Disposition: A | Discharge status: Home / Self Care | Payer: Medicare Other | Source: Ambulatory Visit | Attending: Internal Medicine | Admitting: Internal Medicine

## 2022-05-31 DIAGNOSIS — I5022 Chronic systolic (congestive) heart failure: Secondary | ICD-10-CM | POA: Diagnosis not present

## 2022-06-02 ENCOUNTER — Encounter (HOSPITAL_COMMUNITY)
Admission: RE | Admit: 2022-06-02 | Discharge: 2022-06-02 | Disposition: A | Discharge status: Home / Self Care | Payer: Medicare Other | Source: Ambulatory Visit | Attending: Internal Medicine | Admitting: Internal Medicine

## 2022-06-02 DIAGNOSIS — I5022 Chronic systolic (congestive) heart failure: Secondary | ICD-10-CM

## 2022-06-02 NOTE — Progress Notes (Signed)
Discharge Progress Report  Patient Details  Name: Troy May MRN: [id number] Date of Birth: 1950-11-17 Referring Provider:   Flowsheet Row INTENSIVE CARDIAC REHAB ORIENT from 04/01/2022 in [place]  Referring Provider Glori Bickers, MD        Number of Visits: 26 exercise and 26 education classes  Reason for Discharge:  Patient reached a stable level of exercise. Patient independent in their exercise. Patient has met program and personal goals.  Smoking History:  Social History   Tobacco Use  Smoking Status Former   Types: Cigarettes   Quit date: 07/27/2021   Years since quitting: 0.8  Smokeless Tobacco Not on file    Diagnosis:  Heart failure, chronic systolic ([place])  ADL [facility]:   Initial Exercise Prescription:  Initial Exercise Prescription - 04/01/22 1500       Date of Initial Exercise RX and Referring Provider   Date 04/01/22    Referring Provider Glori Bickers, MD    Expected Discharge Date 06/04/22      Treadmill   MPH 2.5    Grade 0    Minutes 15    METs 3      Arm Ergometer   Level 2.2    Watts 45    RPM 60    Minutes 15    METs 3      Prescription Details   Frequency (times per week) 3    Duration Progress to 30 minutes of continuous aerobic without signs/symptoms of physical distress      Intensity   THRR 40-80% of Max Heartrate 60-119    Ratings of Perceived Exertion 11-13    Perceived Dyspnea 0-4      Progression   Progression Continue progressive overload as per policy without signs/symptoms or physical distress.      Resistance Training   Training Prescription Yes    Weight 4lb    Reps 10-15             Discharge Exercise Prescription (Final Exercise Prescription Changes):  Exercise Prescription Changes - 06/04/22 1024       Response to Exercise   Blood Pressure (Admit) 132/82    Blood Pressure (Exercise) 168/90    Blood Pressure (Exit) 134/84    Heart Rate (Admit) 78 bpm     Heart Rate (Exercise) 115 bpm    Heart Rate (Exit) 79 bpm    Rating of Perceived Exertion (Exercise) 11    Symptoms None    Comments Patient completed the cardiac rehab program.    Duration Continue with 30 min of aerobic exercise without signs/symptoms of physical distress.    Intensity THRR unchanged      Progression   Progression Continue to progress workloads to maintain intensity without signs/symptoms of physical distress.    Average METs 2.7      Resistance Training   Training Prescription Yes    Weight 4lbs    Reps 10-15    Time 10 Minutes      Interval Training   Interval Training No      Treadmill   MPH 2.8    Grade 0    Minutes 15    METs 3.14      NuStep   Level 4    SPM 85    Minutes 15    METs 2.3      Home Exercise Plan   Plans to continue exercise at Home (comment)   walking, stationary bike   Frequency Add 2  additional days to program exercise sessions.    Initial Home Exercises Provided 04/14/22             Functional Capacity:  6 Minute Walk     Row Name 04/01/22 1554 05/28/22 1034       6 Minute Walk   Phase Initial Discharge    Distance 1525 feet 1864 feet    Distance % Change -- 22.23 %    Distance Feet Change -- 339 ft    Walk Time 6 minutes 6 minutes    # of Rest Breaks 0 0    MPH 2.89 3.53    METS 3.33 4.02    RPE 9 11    Perceived Dyspnea  0 0    VO2 Peak 11.67 14.09    Symptoms No No    Resting HR 74 bpm 73 bpm    Resting BP 128/80 128/80    Resting Oxygen Saturation  97 % --    Exercise Oxygen Saturation  during 6 min walk 95 % --    Max Ex. HR 90 bpm 107 bpm    Max Ex. BP 160/84 156/82    2 Minute Post BP 138/80 122/82             Psychological, QOL, Others - Outcomes: PHQ 2/9:    06/02/2022   11:45 AM 04/01/2022    1:52 PM 02/08/2022   10:11 AM  Depression screen PHQ 2/9  Decreased Interest 0 0 0  Down, Depressed, Hopeless 0 0 0  PHQ - 2 Score 0 0 0    Quality of Life:  Quality of Life - 04/01/22 1554        Quality of Life   Select Quality of Life      Quality of Life Scores   Health/Function Pre 23.07 %    Socioeconomic Pre 23.21 %    Psych/Spiritual Pre 25.71 %    Family Pre 24.17 %    GLOBAL Pre 23.81 %             Personal Goals: Goals established at orientation with interventions provided to work toward goal.  Personal Goals and Risk Factors at Admission - 04/01/22 1602       Core Components/Risk Factors/Patient Goals on Admission    Weight Management Yes;Weight Maintenance   Simultaneous filing. User may not have seen previous data.   Intervention Weight Management: Develop a combined nutrition and exercise program designed to reach desired caloric intake, while maintaining appropriate intake of nutrient and fiber, sodium and fats, and appropriate energy expenditure required for the weight goal.;Weight Management: Provide education and appropriate resources to help participant work on and attain dietary goals.    Expected Outcomes Short Term: Continue to assess and modify interventions until short term weight is achieved;Long Term: Adherence to nutrition and physical activity/exercise program aimed toward attainment of established weight goal;Weight Maintenance: Understanding of the daily nutrition guidelines, which includes 25-35% calories from fat, 7% or less cal from saturated fats, less than $RemoveB'200mg'kgXhrxXm$  cholesterol, less than 1.5gm of sodium, & 5 or more servings of fruits and vegetables daily;Understanding recommendations for meals to include 15-35% energy as protein, 25-35% energy from fat, 35-60% energy from carbohydrates, less than $RemoveB'200mg'zaUaOofg$  of dietary cholesterol, 20-35 gm of total fiber daily;Understanding of distribution of calorie intake throughout the day with the consumption of 4-5 meals/snacks    Heart Failure Yes    Intervention Provide a combined exercise and nutrition program that is supplemented with education, support  and counseling about heart failure. Directed toward  relieving symptoms such as shortness of breath, decreased exercise tolerance, and extremity edema.    Expected Outcomes Improve functional capacity of life;Short term: Attendance in program 2-3 days a week with increased exercise capacity. Reported lower sodium intake. Reported increased fruit and vegetable intake. Reports medication compliance.;Short term: Daily weights obtained and reported for increase. Utilizing diuretic protocols set by physician.;Long term: Adoption of self-care skills and reduction of barriers for early signs and symptoms recognition and intervention leading to self-care maintenance.    Hypertension Yes    Intervention Provide education on lifestyle modifcations including regular physical activity/exercise, weight management, moderate sodium restriction and increased consumption of fresh fruit, vegetables, and low fat dairy, alcohol moderation, and smoking cessation.;Monitor prescription use compliance.    Expected Outcomes Short Term: Continued assessment and intervention until BP is < 140/43mm HG in hypertensive participants. < 130/93mm HG in hypertensive participants with diabetes, heart failure or chronic kidney disease.;Long Term: Maintenance of blood pressure at goal levels.    Lipids Yes    Intervention Provide education and support for participant on nutrition & aerobic/resistive exercise along with prescribed medications to achieve LDL '70mg'$ , HDL >$Remo'40mg'xAMuD$ .    Expected Outcomes Short Term: Participant states understanding of desired cholesterol values and is compliant with medications prescribed. Participant is following exercise prescription and nutrition guidelines.;Long Term: Cholesterol controlled with medications as prescribed, with individualized exercise RX and with personalized nutrition plan. Value goals: LDL < $Rem'70mg'Xqdj$ , HDL > 40 mg.    Stress Yes    Intervention Offer individual and/or small group education and counseling on adjustment to heart disease, stress management  and health-related lifestyle change. Teach and support self-help strategies.;Refer participants experiencing significant psychosocial distress to appropriate mental health specialists for further evaluation and treatment. When possible, include family members and significant others in education/counseling sessions.    Expected Outcomes Long Term: Emotional wellbeing is indicated by absence of clinically significant psychosocial distress or social isolation.;Short Term: Participant demonstrates changes in health-related behavior, relaxation and other stress management skills, ability to obtain effective social support, and compliance with psychotropic medications if prescribed.              Personal Goals Discharge:  Goals and Risk Factor Review     Row Name 04/07/22 0840 04/20/22 1452 05/18/22 0955 06/14/22 0957       Core Components/Risk Factors/Patient Goals Review   Personal Goals Review Weight Management/Obesity;Hypertension;Heart Failure;Lipids Weight Management/Obesity;Hypertension;Heart Failure;Lipids Weight Management/Obesity;Hypertension;Heart Failure;Lipids Weight Management/Obesity;Hypertension;Heart Failure;Lipids    Review Lucile started  intensive cardiac rehab on 04/05/22. Caison did well with exercise. Vital signs were stable Stellan has been doing  well with exercise. Vital signs have been stable. Lavoris has been doing  well with exercise. Vital signs have been stable. Arsal has gained weight since starting the program. The heart failure clinic has been notified. Joshaua is asymptomatic and is not following a heart healthy diet like he should. Raydel did  well with exercise. Vital signs have been stable. Josephus completed intensive cardiac rehab on 06/04/22    Expected Outcomes Darik will continue to participate in intensive cardiac rehab for exercise, nutrition and lifestyle modifications Feliz will continue to participate in intensive cardiac rehab for exercise, nutrition and lifestyle modifications Yuya will  continue to participate in intenive cardiac rehab for exercise, nutrition and lifestyle modifications Lionel will continue to exercise, follow  nutrition and lifestyle modifications upon completion of intensive cardiac rehab.  Exercise Goals and Review:  Exercise Goals     Row Name 04/01/22 1559             Exercise Goals   Increase Physical Activity Yes       Intervention Provide advice, education, support and counseling about physical activity/exercise needs.;Develop an individualized exercise prescription for aerobic and resistive training based on initial evaluation findings, risk stratification, comorbidities and participant's personal goals.       Expected Outcomes Short Term: Attend rehab on a regular basis to increase amount of physical activity.;Long Term: Add in home exercise to make exercise part of routine and to increase amount of physical activity.;Long Term: Exercising regularly at least 3-5 days a week.       Increase Strength and Stamina Yes       Intervention Provide advice, education, support and counseling about physical activity/exercise needs.;Develop an individualized exercise prescription for aerobic and resistive training based on initial evaluation findings, risk stratification, comorbidities and participant's personal goals.       Expected Outcomes Short Term: Increase workloads from initial exercise prescription for resistance, speed, and METs.;Short Term: Perform resistance training exercises routinely during rehab and add in resistance training at home;Long Term: Improve cardiorespiratory fitness, muscular endurance and strength as measured by increased METs and functional capacity (6MWT)       Able to understand and use rate of perceived exertion (RPE) scale Yes       Intervention Provide education and explanation on how to use RPE scale       Expected Outcomes Short Term: Able to use RPE daily in rehab to express subjective intensity level;Long Term:   Able to use RPE to guide intensity level when exercising independently       Knowledge and understanding of Target Heart Rate Range (THRR) Yes       Intervention Provide education and explanation of THRR including how the numbers were predicted and where they are located for reference       Expected Outcomes Short Term: Able to state/look up THRR;Short Term: Able to use daily as guideline for intensity in rehab;Long Term: Able to use THRR to govern intensity when exercising independently       Understanding of Exercise Prescription Yes       Intervention Provide education, explanation, and written materials on patient's individual exercise prescription       Expected Outcomes Short Term: Able to explain program exercise prescription;Long Term: Able to explain home exercise prescription to exercise independently                Exercise Goals Re-Evaluation:  Exercise Goals Re-Evaluation     Row Name 04/05/22 1127 04/14/22 1042 05/05/22 1118 05/31/22 1106 06/04/22 1128     Exercise Goal Re-Evaluation   Exercise Goals Review Increase Physical Activity;Able to understand and use rate of perceived exertion (RPE) scale;Increase Strength and Stamina Increase Physical Activity;Able to understand and use rate of perceived exertion (RPE) scale;Increase Strength and Stamina;Understanding of Exercise Prescription;Knowledge and understanding of Target Heart Rate Range (THRR) Increase Physical Activity;Able to understand and use rate of perceived exertion (RPE) scale;Increase Strength and Stamina;Understanding of Exercise Prescription;Knowledge and understanding of Target Heart Rate Range (THRR) Increase Physical Activity;Able to understand and use rate of perceived exertion (RPE) scale;Increase Strength and Stamina;Understanding of Exercise Prescription;Knowledge and understanding of Target Heart Rate Range (THRR) Increase Physical Activity;Able to understand and use rate of perceived exertion (RPE)  scale;Increase Strength and Stamina;Understanding of Exercise Prescription;Knowledge and understanding of Target  Heart Rate Range (THRR)   Comments Patient able to understand and use RPE scale appropriately. Reviewed exercise prescription with patient. Patient is doing yard work and push-ups but no formal exercise. Patient plans to walk and has a stationary bike at home that he can use as his mode of home exercise. Encouraged patient to try and achieve 150 minutes of aerobic exericse, and patient is amenable to this. Patient's goal is to be able to decrease amount of medications he takes with MD approval. Patient is making steady progress with exercise. Patient is riding his stationary bike at home 15 minutes, ~2 days/week. Patient is scheduled to complete the cardiac rehab program this week and has progressed well. Functional capacity increased 22% as measured by 6MWT. Patient has an outdoor bike and a stationary bike at home and plans to exericse 30-60 minutes at least 3 days/week. Patient completed the cardiac rehab program and will continue exercise riding his outdoor and stationary bikes and walking.   Expected Outcomes Progress workloads as tolerated to help improve cardiorespiratory fitness. Patient will walk or ride his staionary bike 30 minutes, at least 2 days/week in addition to exercise at cardiac rehab to achieve 150 minutes of aerobic exercise/week. Continue to progress workloads as tolerated. Patient will exercise 30-60 minutes at least 3 days/week to maintain health and fitness gains. Patient will exercise 30-60 minutes at least 3 days/week to maintain health and fitness gains.            Nutrition & Weight - Outcomes:  Pre Biometrics - 04/01/22 1553       Pre Biometrics   Waist Circumference 42 inches    Hip Circumference 43.75 inches    Waist to Hip Ratio 0.96 %    Triceps Skinfold 16 mm    % Body Fat 28.9 %    Grip Strength 38 kg    Flexibility 15.75 in    Single Leg Stand 30  seconds             Post Biometrics - 06/04/22 1037        Post  Biometrics   Height $Remov'5\' 9"'PfRJMU$  (1.753 m)    Waist Circumference 41 inches    Hip Circumference 43.5 inches    Waist to Hip Ratio 0.94 %    BMI (Calculated) 29.03    Triceps Skinfold 22 mm    % Body Fat 30.1 %    Grip Strength 36 kg    Flexibility 16.13 in    Single Leg Stand 12 seconds             Nutrition:  Nutrition Therapy & Goals - 06/02/22 1055       Nutrition Therapy   Diet Heart Healthy Diet    Drug/Food Interactions Statins/Certain Fruits      Personal Nutrition Goals   Nutrition Goal Patient to choose a daily variety of fruits, vegetables, whole grains, lean protein/plant protein, and nonfat dairy as part of heart healthy lifestyle    Personal Goal #2 Patient to limit sodium intake to '1500mg'$  daily    Personal Goal #3 Patient to identify and limit food sources of saturated fat, trans fat, sodium, and refined carbohydrates    Comments Taniela is in the contemplation stage of change toward the P[employer]ikin eating plan/heart healthy diet changes. Marv continues to attend the [employer] education courses and reports making some dietary changes including reading food labels for sodium and increased awareness/knowledge of benefits of high fiber foods, increased fruit/vegetable consumption, and reduction in  saturated fat. Jaymin admits he stil has room for improvement on execution of dietary changes and the consistency of dietary changes.      Intervention Plan   Intervention Prescribe, educate and counsel regarding individualized specific dietary modifications aiming towards targeted core components such as weight, hypertension, lipid management, diabetes, heart failure and other comorbidities.;Nutrition handout(s) given to patient.    Expected Outcomes Short Term Goal: Understand basic principles of dietary content, such as calories, fat, sodium, cholesterol and nutrients.;Long Term Goal: Adherence to prescribed nutrition  plan.             Nutrition Discharge:   Education Questionnaire Score:  Knowledge Questionnaire Score - 04/01/22 1552       Knowledge Questionnaire Score   Pre Score 21/28             Goals reviewed with patient; copy given to patient.Pt graduates from  Intensive cardiac rehab program on 06/04/22 with completion of   26 exercise and  26 education sessions. Pt maintained good attendance and progressed nicely during their participation in rehab as evidenced by increased MET level.   Medication list reconciled. Repeat  PHQ score- 0 .  Pt has made significant lifestyle changes and should be commended for their success. Trayden achieved his goals during cardiac rehab.   Pt plans to continue exercise on his stationary bike and outdoor bike when weather permits. Salar gained 3 kg while participating in the program. Kyandre increased his distance on his post exercise walk test by 339 feet. Dequane enjoyed participating in intensive cardiac rehab and feels stronger.Harrell Gave RN BSN

## 2022-06-04 ENCOUNTER — Encounter (HOSPITAL_COMMUNITY)
Admission: RE | Admit: 2022-06-04 | Discharge: 2022-06-04 | Disposition: A | Discharge status: Home / Self Care | Payer: Medicare Other | Source: Ambulatory Visit | Attending: Internal Medicine | Admitting: Internal Medicine

## 2022-06-04 VITALS — BP 132/82 | HR 78 | Ht 69.0 in | Wt 196.7 lb

## 2022-06-04 DIAGNOSIS — I5022 Chronic systolic (congestive) heart failure: Secondary | ICD-10-CM

## 2022-06-10 NOTE — Progress Notes (Signed)
ADVANCED HF CLINIC  NOTE  Primary Care: Agustina Caroli, MD HF Cardiologist:  Dr. Haroldine Laws  HPI: Troy May is a 71 y.o. male w/ HTN, HLD, CAD, and BiV CHF/iCM.  Admitted 11/22 w/ CP, Influenza A+. Hypertensive on arrival requiring labetalol gtt, CT chest - PE but + for cardiomegaly, coronary CA2+, mild aneurysmal dilatation of the ascending thoracic aorta 43 mm; bilateral renal masses suspicious for RCC.  Echo 11/22 EF 25-30% w/LV apical thrombus G2DD, RV moderately HK. R/LHC severe 2V CAD w/ occluded LAD & RCA,  elevated filling pressures w/ low output, CI 1.7. No good options for revasc -> med rx. cMRI LVEF 21%, LAD/RCA territories with scar and do not appear viable, large LV thrombus. Discharge weight 172 lbs.  Echo 3/23: EF 25-30%, + apical thrombus, moderate MR/TR  Follow up 3/23. Toprol XL 25 added. Referred to EP for ICD. Saw Dr. Caryl Comes for evaluation 12/11/21. Wanted to think about ICD.  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 Follows with Urology q 6 months for surveillance.  Follow up 6/23, off all meds for a few weeks. GDMT restarted. Declined ICD.  Today he returns for HF follow up with his wife. Overall feeling fine. He finished CR and feels good. No SOB at cardiac rehab or walking up stairs. Denies palpitations, abnormal bleeding, CP, dizziness, edema, or PND/Orthopnea. Appetite ok. No fever or chills. Weight at home 194 pounds. Taking all medications. Doing better with med compliance. Has not needed torsemide. Wife away Sun-Thurs during the week keeping their grandkids in [place].    Cardiac Studies: - Echo (3/23): EF 25-30%, + apical thrombus, moderate MR/TR  - Echo (11/22): EF 25-30%, RV moderately reduced, GIIDD  - cMRI (11/22): EF 21% and minimal viability in RCA and LAD territories  - [facility] 07/27/21:   Ost RCA to Prox RCA lesion is 100% stenosed.   Prox Cx to Mid Cx lesion is 30% stenosed.   1st Mrg  lesion is 20% stenosed.   Prox LAD lesion is 60% stenosed.   Mid LAD lesion is 90% stenosed.   Mid LAD to Dist LAD lesion is 100% stenosed.   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 Swallow whole. 30 tablet 11   atorvastatin (LIPITOR) 80 MG tablet Take 1 tablet (80 mg total) by mouth daily. 30 tablet 11   cetirizine (ZYRTEC) 10 MG tablet Take 10 mg by mouth daily as needed for allergies.     empagliflozin (JARDIANCE) 10 MG TABS tablet Take 1 tablet (10 mg total) by mouth daily. 30 tablet 11  metoprolol succinate (TOPROL XL) 50 MG 24 hr tablet Take 1 tablet (50 mg total) by mouth at bedtime. 30 tablet 11   OVER THE COUNTER MEDICATION Take 1 tablet by mouth daily. gummy     rivaroxaban (XARELTO) 20 MG TABS tablet Take 1 tablet (20 mg total) by mouth daily with supper. 30 tablet 11   sacubitril-valsartan (ENTRESTO) 97-103 MG Take 1 tablet by mouth 2 (two) times daily. 180 tablet 3   spironolactone (ALDACTONE) 25 MG tablet Take 0.5 tablets (12.5 mg total) by mouth daily. 45 tablet 1   torsemide (DEMADEX) 20 MG tablet Take 1 tablet (20 mg total) by mouth daily as needed (fluid retention). 30 tablet 6   No current facility-administered medications for this encounter.   No Known Allergies  Social  History   Socioeconomic History   Marital status: Married    Spouse name: Not on file   Number of children: 2   Years of education: 12   Highest education level: High school graduate  Occupational History   Occupation: Retired  Tobacco Use   Smoking status: Former    Types: Cigarettes    Quit date: 07/27/2021    Years since quitting: 0.8   Smokeless tobacco: Not on file  Substance and Sexual Activity   Alcohol use: Not Currently   Drug use: Never   Sexual activity: Not on file  Other Topics Concern   Not on file  Social History Narrative   Not on file   Social Determinants of Health   Financial Resource Strain: Not on file  Food Insecurity: Not on file  Transportation Needs: Not on file  Physical Activity: Not on file  Stress: Not on file  Social Connections: Not on file  Intimate Partner Violence: Not on file   Family History  Problem Relation Age of Onset   Hypertension Brother    Heart failure Neg Hx    BP 134/78   Pulse 66   Wt 90.7 kg (200 lb)   SpO2 97%   BMI 29.53 kg/m   Wt Readings from Last 3 Encounters:  06/11/22 90.7 kg (200 lb)  06/04/22 89.2 kg (196 lb 10.4 oz)  04/15/22 87.9 kg (193 lb 12.8 oz)   PHYSICAL EXAM: General:  NAD. No resp difficulty, walked into clinic. HEENT: Normal Neck: Supple. No JVD. Carotids 2+ bilat; no bruits. No lymphadenopathy or thryomegaly appreciated. Cor: PMI nondisplaced. Regular rate & rhythm. No rubs, gallops or murmurs. Lungs: Clear Abdomen: Soft, nontender, nondistended. No hepatosplenomegaly. No bruits or masses. Good bowel sounds. Extremities: No cyanosis, clubbing, rash, edema Neuro: Alert & oriented x 3, cranial nerves grossly intact. Moves all 4 extremities w/o difficulty. Affect pleasant.  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 - Echo (10/30/21): EF 25-30% + apical clot. Moderate MR/TR. - NYHA I-II, volume looks good on exam.  - Continue Entresto 97/103 mg bid. - Continue spiro 12.5 mg daily. Will not increase today with increased SCr on last labs. - Continue torsemide 20 mg PRN. Has not needed recently. - Continue Jardiance 10 mg daily.  - Continue Toprol XL 50 mg q hs. - Has seen EP. Declines ICD. Discussed again today, continues to decline. - Doing better with medication compliance. - Labs today. -  Repeat echo to look for any EF improvement  2. CAD - Cath 11/22 severe 2V CAD, mid-distal LAD 100% stenosed, ost-prox RCA 100% stenosed  - cMRI 11/22  EF 21% and larg  inferior and apical scars with minimal viability in the RCA/LAD territories. Large LV clot.  - No options for revascularization, continue medical therapy. - No s/s angina - Continue ASA 81 mg daily + atorvastatin 80 + ? blocker. - Finished CR.   3. LV Thrombus  - 2/2 severe LV dysfunction  - Continue Xarelto 20 mg daily, Consider cutting back to 15 mg daily if CrCl consistently < 50 ml/min (current CrCl 71 ml/min)   4. Bilateral Renal Masses, suspicion for RCC - Incidental finding on chest CT. Also noted on recent MRI abdomen. - Not amenable to coiling per IR. - Following with Urology.   5. Hypertension  - BP ok. - GDMT as above.  6. Ascending Thoracic Aortic Aneurysm - 43 mm in diameter on chest CT - Asc Ao 4.0 cm on echo 03/23.  Follow up in 3 months with Dr. Haroldine Laws. Will try to get echo soon.  Rafael Bihari, FNP  10:45 AM

## 2022-06-11 ENCOUNTER — Ambulatory Visit (HOSPITAL_COMMUNITY)
Admission: RE | Admit: 2022-06-11 | Discharge: 2022-06-11 | Disposition: A | Discharge status: Home / Self Care | Payer: Medicare Other | Source: Ambulatory Visit | Attending: Family Medicine | Admitting: Family Medicine

## 2022-06-11 ENCOUNTER — Encounter (HOSPITAL_COMMUNITY): Payer: Self-pay

## 2022-06-11 VITALS — BP 134/78 | HR 66 | Wt 200.0 lb

## 2022-06-11 DIAGNOSIS — I7121 Aneurysm of the ascending aorta, without rupture: Secondary | ICD-10-CM

## 2022-06-11 DIAGNOSIS — I110 Hypertensive heart disease with heart failure: Secondary | ICD-10-CM | POA: Insufficient documentation

## 2022-06-11 DIAGNOSIS — I513 Intracardiac thrombosis, not elsewhere classified: Secondary | ICD-10-CM

## 2022-06-11 DIAGNOSIS — I5022 Chronic systolic (congestive) heart failure: Secondary | ICD-10-CM

## 2022-06-11 DIAGNOSIS — Z7982 Long term (current) use of aspirin: Secondary | ICD-10-CM | POA: Diagnosis not present

## 2022-06-11 DIAGNOSIS — Z7901 Long term (current) use of anticoagulants: Secondary | ICD-10-CM | POA: Diagnosis not present

## 2022-06-11 DIAGNOSIS — I240 Acute coronary thrombosis not resulting in myocardial infarction: Secondary | ICD-10-CM | POA: Diagnosis not present

## 2022-06-11 DIAGNOSIS — N2889 Other specified disorders of kidney and ureter: Secondary | ICD-10-CM | POA: Diagnosis not present

## 2022-06-11 DIAGNOSIS — I5082 Biventricular heart failure: Secondary | ICD-10-CM | POA: Diagnosis present

## 2022-06-11 DIAGNOSIS — Z79899 Other long term (current) drug therapy: Secondary | ICD-10-CM | POA: Insufficient documentation

## 2022-06-11 DIAGNOSIS — I255 Ischemic cardiomyopathy: Secondary | ICD-10-CM | POA: Insufficient documentation

## 2022-06-11 DIAGNOSIS — I10 Essential (primary) hypertension: Secondary | ICD-10-CM

## 2022-06-11 DIAGNOSIS — I2510 Atherosclerotic heart disease of native coronary artery without angina pectoris: Secondary | ICD-10-CM

## 2022-06-11 DIAGNOSIS — Z7984 Long term (current) use of oral hypoglycemic drugs: Secondary | ICD-10-CM | POA: Diagnosis not present

## 2022-06-11 LAB — BASIC METABOLIC PANEL
Anion gap: 5 (ref 5–15)
BUN: 12 mg/dL (ref 8–23)
CO2: 24 mmol/L (ref 22–32)
Calcium: 8.7 mg/dL — ABNORMAL LOW (ref 8.9–10.3)
Chloride: 109 mmol/L (ref 98–111)
Creatinine, Ser: 1.45 mg/dL — ABNORMAL HIGH (ref 0.61–1.24)
GFR, Estimated: 52 mL/min — ABNORMAL LOW (ref >60)
Glucose, Bld: 110 mg/dL — ABNORMAL HIGH (ref 70–99)
Potassium: 4.8 mmol/L (ref 3.5–5.1)
Sodium: 138 mmol/L (ref 135–145)

## 2022-06-11 LAB — BRAIN NATRIURETIC PEPTIDE: B Natriuretic Peptide: 463.3 pg/mL — ABNORMAL HIGH (ref 0.0–100.0)

## 2022-06-11 NOTE — Patient Instructions (Signed)
It was great to see you today! No medication changes are needed at this time.   Labs today We will only contact you if something comes back abnormal or we need to make some changes. Otherwise no news is good news!   Your physician has requested that you have an echocardiogram. Echocardiography is a painless test that uses sound waves to create images of your heart. It provides your doctor with information about the size and shape of your heart and how well your heart's chambers and valves are working. This procedure takes approximately one hour. There are no restrictions for this procedure. Please do NOT wear cologne, perfume, aftershave, or lotions (deodorant is allowed). Please arrive 15 minutes prior to your appointment time.   Your physician wants you to follow-up in: 3 months with Dr Haroldine Laws. You will receive a reminder letter in the mail two months in advance. If you don't receive a letter, please call our office to schedule the follow-up appointment in January 2024.   Do the following things EVERYDAY: Weigh yourself in the morning before breakfast. Write it down and keep it in a log. Take your medicines as prescribed Eat low salt foods--Limit salt (sodium) to 2000 mg per day.  Stay as active as you can everyday Limit all fluids for the day to less than 2 liters   At the [facility], you and your health needs are our priority. As part of our continuing mission to provide you with exceptional heart care, we have created designated Provider Care Teams. These Care Teams include your primary Cardiologist (physician) and Advanced Practice Providers (APPs- Physician Assistants and Nurse Practitioners) who all work together to provide you with the care you need, when you need it.   You may see any of the following providers on your designated Care Team at your next follow up: Dr Glori Bickers Dr Loralie Champagne Dr. Roxana Hires, NP Lyda Jester,  [facility] [place], [place] Forestine Na, NP Audry Riles, PharmD   Please be sure to bring in all your medications bottles to every appointment.

## 2022-06-24 ENCOUNTER — Other Ambulatory Visit: Payer: Self-pay | Admitting: Urology

## 2022-06-24 DIAGNOSIS — D49511 Neoplasm of unspecified behavior of right kidney: Secondary | ICD-10-CM

## 2022-06-24 DIAGNOSIS — D49512 Neoplasm of unspecified behavior of left kidney: Secondary | ICD-10-CM

## 2022-07-02 ENCOUNTER — Ambulatory Visit (HOSPITAL_COMMUNITY)
Admission: RE | Admit: 2022-07-02 | Discharge: 2022-07-02 | Disposition: A | Discharge status: Home / Self Care | Payer: Medicare Other | Source: Ambulatory Visit | Attending: Emergency Medicine | Admitting: Emergency Medicine

## 2022-07-02 DIAGNOSIS — I509 Heart failure, unspecified: Secondary | ICD-10-CM | POA: Diagnosis not present

## 2022-07-02 DIAGNOSIS — I081 Rheumatic disorders of both mitral and tricuspid valves: Secondary | ICD-10-CM | POA: Diagnosis not present

## 2022-07-02 DIAGNOSIS — I5022 Chronic systolic (congestive) heart failure: Secondary | ICD-10-CM

## 2022-07-02 DIAGNOSIS — I110 Hypertensive heart disease with heart failure: Secondary | ICD-10-CM | POA: Diagnosis present

## 2022-07-02 DIAGNOSIS — I2510 Atherosclerotic heart disease of native coronary artery without angina pectoris: Secondary | ICD-10-CM | POA: Diagnosis not present

## 2022-07-02 LAB — ECHOCARDIOGRAM COMPLETE
Area-P 1/2: 2.71 cm2
S' Lateral: 4.3 cm

## 2022-07-02 MED ORDER — PERFLUTREN LIPID MICROSPHERE
1.0000 mL | INTRAVENOUS | Status: AC | PRN
  Administered 2022-07-02: 3 mL via INTRAVENOUS

## 2022-07-02 NOTE — Progress Notes (Signed)
  Echocardiogram 2D Echocardiogram has been performed.  Troy May 07/02/2022, 11:33 AM

## 2022-07-27 ENCOUNTER — Telehealth (HOSPITAL_COMMUNITY): Payer: Self-pay | Admitting: *Deleted

## 2022-07-27 NOTE — Telephone Encounter (Signed)
Pts wife left vm asking if is ok to hold xarelto for his colonoscopy and if so for how many days.  Routed to [place] for advice

## 2022-07-29 NOTE — Telephone Encounter (Signed)
Pt aware via wife 

## 2022-07-30 ENCOUNTER — Ambulatory Visit
Admission: RE | Admit: 2022-07-30 | Discharge: 2022-07-30 | Disposition: A | Discharge status: Home / Self Care | Payer: Medicare Other | Source: Ambulatory Visit | Attending: Urology | Admitting: Urology

## 2022-07-30 DIAGNOSIS — D49511 Neoplasm of unspecified behavior of right kidney: Secondary | ICD-10-CM

## 2022-07-30 DIAGNOSIS — D49512 Neoplasm of unspecified behavior of left kidney: Secondary | ICD-10-CM

## 2022-07-30 MED ORDER — GADOPICLENOL 0.5 MMOL/ML IV SOLN
9.0000 mL | Freq: Once | INTRAVENOUS | Status: AC | PRN
  Administered 2022-07-30: 9 mL via INTRAVENOUS

## 2022-08-12 ENCOUNTER — Ambulatory Visit: Payer: Medicare Other | Admitting: Emergency Medicine

## 2022-08-19 ENCOUNTER — Ambulatory Visit: Payer: Medicare Other | Admitting: Emergency Medicine

## 2022-08-31 ENCOUNTER — Ambulatory Visit: Payer: Medicare Other | Admitting: Emergency Medicine

## 2022-08-31 ENCOUNTER — Encounter: Payer: Self-pay | Admitting: Emergency Medicine

## 2022-08-31 VITALS — BP 128/76 | HR 57 | Temp 98.0°F | Ht 69.0 in | Wt 207.5 lb

## 2022-08-31 DIAGNOSIS — E785 Hyperlipidemia, unspecified: Secondary | ICD-10-CM

## 2022-08-31 DIAGNOSIS — I5022 Chronic systolic (congestive) heart failure: Secondary | ICD-10-CM | POA: Diagnosis not present

## 2022-08-31 DIAGNOSIS — I25118 Atherosclerotic heart disease of native coronary artery with other forms of angina pectoris: Secondary | ICD-10-CM

## 2022-08-31 DIAGNOSIS — I513 Intracardiac thrombosis, not elsewhere classified: Secondary | ICD-10-CM

## 2022-08-31 DIAGNOSIS — I10 Essential (primary) hypertension: Secondary | ICD-10-CM | POA: Diagnosis not present

## 2022-08-31 NOTE — Assessment & Plan Note (Signed)
Stable.  Continue atorvastatin 80mg daily

## 2022-08-31 NOTE — Assessment & Plan Note (Signed)
Well-controlled hypertension. Continue Entresto, Aldactone, and metoprolol succinate

## 2022-08-31 NOTE — Assessment & Plan Note (Signed)
Stable.  Continue Xarelto 20 mg daily Fall precaution was given. No clinical bleeding episodes.

## 2022-08-31 NOTE — Progress Notes (Signed)
Troy May 72 y.o.   Chief Complaint  Patient presents with   Follow-up    19mth f/u appt, no concerns     HISTORY OF PRESENT ILLNESS: This is a 72y.o. male here for 646-monthollow-up of congestive heart failure Overall doing well. Has no complaints or medical concerns today.  HPI   Prior to Admission medications   Medication Sig Start Date End Date Taking? Authorizing Provider  aspirin EC 81 MG tablet Take 1 tablet (81 mg total) by mouth daily. Swallow whole. 02/05/22  Yes FiJoette CatchingPA-C  atorvastatin (LIPITOR) 80 MG tablet Take 1 tablet (80 mg total) by mouth daily. 02/05/22  Yes FiJoette CatchingPA-C  cetirizine (ZYRTEC) 10 MG tablet Take 10 mg by mouth daily as needed for allergies.   Yes [provider]  empagliflozin (JARDIANCE) 10 MG TABS tablet Take 1 tablet (10 mg total) by mouth daily. 02/05/22  Yes FiJoette CatchingPA-C  metoprolol succinate (TOPROL XL) 50 MG 24 hr tablet Take 1 tablet (50 mg total) by mouth at bedtime. 04/15/22  Yes Bensimhon, DaShaune PascalMD  OVER THE COUNTER MEDICATION Take 1 tablet by mouth daily. gummy   Yes [provider]  rivaroxaban (XARELTO) 20 MG TABS tablet Take 1 tablet (20 mg total) by mouth daily with supper. 02/05/22  Yes FiJoette CatchingPA-C  sacubitril-valsartan (ENTRESTO) 97-103 MG Take 1 tablet by mouth 2 (two) times daily. 03/19/22  Yes [place], JeMaricela BoFNP  spironolactone (ALDACTONE) 25 MG tablet Take 0.5 tablets (12.5 mg total) by mouth daily. 02/05/22  Yes FiJoette CatchingPA-C  torsemide (DEMADEX) 20 MG tablet Take 1 tablet (20 mg total) by mouth daily as needed (fluid retention). 02/05/22  Yes FiJoette CatchingPA-C    No Known Allergies  Patient Active Problem List   Diagnosis Date Noted   Coronary artery disease of native artery of native heart with stable angina pectoris (HCElgin0643/15/4008 Chronic systolic heart failure (HCPilgrim12/02/2021   NSTEMI (non-ST elevated myocardial  infarction) (HCDur[place]11/26/2022   Essential hypertension 07/25/2021   Acute combined systolic and diastolic heart failure (HCLex[place]   Left ventricular apical thrombus     Past Medical History:  Diagnosis Date   Ascending aortic aneurysm (HCC)    Bilateral renal masses    CAD (coronary artery disease)    CHF (congestive heart failure) (HCC)    HLD (hyperlipidemia)    Hypertension    LV (left ventricular) mural thrombus    Systolic heart failure (HCClovis    Past Surgical History:  Procedure Laterality Date   CARDIAC CATHETERIZATION     RIGHT/LEFT HEART CATH AND CORONARY ANGIOGRAPHY N/A 07/27/2021   Procedure: RIGHT/LEFT HEART CATH AND CORONARY ANGIOGRAPHY;  Surgeon: BeJolaine ArtistMD;  [facility];  Service: Cardiovascular;  Laterality: N/A;    Social History   Socioeconomic History   Marital status: Married    Spouse name: Not on file   Number of children: 2   Years of education: 12   Highest education level: High school graduate  Occupational History   Occupation: Retired  Tobacco Use   Smoking status: Former    Types: Cigarettes    Quit date: 07/27/2021    Years since quitting: 1.0   Smokeless tobacco: Not on file  Substance and Sexual Activity   Alcohol use: Not Currently   Drug use: Never   Sexual activity: Not on file  Other Topics  Concern   Not on file  Social History Narrative   Not on file   Social Determinants of Health   Financial Resource Strain: Not on file  Food Insecurity: Not on file  Transportation Needs: Not on file  Physical Activity: Not on file  Stress: Not on file  Social Connections: Not on file  Intimate Partner Violence: Not on file    Family History  Problem Relation Age of Onset   Hypertension Brother    Heart failure Neg Hx      Review of Systems  Constitutional: Negative.  Negative for chills and fever.  HENT: Negative.  Negative for congestion, nosebleeds and sore throat.   Respiratory: Negative.   Negative for cough, hemoptysis and shortness of breath.   Cardiovascular: Negative.  Negative for chest pain and palpitations.  Gastrointestinal:  Negative for abdominal pain, blood in stool, diarrhea, nausea and vomiting.  Genitourinary:  Negative for hematuria.  Skin: Negative.  Negative for rash.  Neurological:  Negative for dizziness and headaches.  All other systems reviewed and are negative.   Today's Vitals   08/31/22 1343  BP: 128/76  Pulse: (!) 57  Temp: 98 F (36.7 C)  TempSrc: Oral  SpO2: 94%  Weight: 207 lb 8 oz (94.1 kg)  Height: '5\' 9"'$  (1.753 m)   Body mass index is 30.64 kg/m.  Physical Exam Vitals reviewed.  Constitutional:      Appearance: Normal appearance.  HENT:     Head: Normocephalic.  Eyes:     Extraocular Movements: Extraocular movements intact.     Pupils: Pupils are equal, round, and reactive to light.  Cardiovascular:     Rate and Rhythm: Normal rate and regular rhythm.     Pulses: Normal pulses.     Heart sounds: Normal heart sounds.  Pulmonary:     Effort: Pulmonary effort is normal.     Breath sounds: Normal breath sounds.  Musculoskeletal:     Cervical back: No tenderness.     Right lower leg: No edema.     Left lower leg: No edema.  Lymphadenopathy:     Cervical: No cervical adenopathy.  Skin:    General: Skin is warm and dry.  Neurological:     General: No focal deficit present.     Mental Status: He is alert and oriented to person, place, and time.  Psychiatric:        Mood and Affect: Mood normal.        Behavior: Behavior normal.      ASSESSMENT & PLAN: A total of 48 minutes was spent with the patient and counseling/coordination of care regarding preparing for this visit, review of most recent office visit notes, review of multiple chronic medical conditions under management, review of all medications, review of most recent blood work results, cardiovascular risks associated with hypertension and congestive heart failure,  education on nutrition, prognosis, documentation, and need for follow-up.  Problem List Items Addressed This Visit       Cardiovascular and Mediastinum   Essential hypertension    Well-controlled hypertension. Continue Entresto, Aldactone, and metoprolol succinate      Left ventricular apical thrombus    Stable.  Continue Xarelto 20 mg daily Fall precaution was given. No clinical bleeding episodes.      Chronic systolic heart failure (HCC) - Primary    Clinically euvolemic.  No clinical signs of CHF. Continue Entresto 97-103 mg twice daily along with Jardiance 10 mg daily, metoprolol succinate 50 mg daily and  Aldactone 12.5 mg daily Only using torsemide as needed      Coronary artery disease of native artery of native heart with stable angina pectoris (HCC)    No recent anginal episodes.  No use for nitroglycerin. Continue beta-blocker with metoprolol succinate 50 mg daily Continue daily baby aspirin        Other   Dyslipidemia    Stable.  Continue atorvastatin 80 mg daily.      Patient Instructions  Heart Failure, Diagnosis  Heart failure means that your heart is not able to pump blood in the right way. This makes it hard for your body to work well. Heart failure is usually a long-term (chronic) condition. You must take good care of yourself and follow your treatment plan from your doctor. Different stages of heart failure have different treatment plans. The stages are: Stage A: At risk for heart failure. Stage B: Pre-heart failure. Stage C: Symptomatic heart failure. Stage D: Advanced heart failure. What are the causes? High blood pressure. Buildup of cholesterol and fat in the arteries. Heart attack. This injures the heart muscle. Heart valves that do not open and close properly. Damage of the heart muscle. This is also called cardiomyopathy. Infection of the heart muscle. This is also called myocarditis. Lung disease. What increases the risk? Getting older.  The risk of heart failure goes up as a person ages. Being overweight. Using tobacco or nicotine products. Abusing alcohol or drugs. Having taken medicines that can damage the heart. Having any of these conditions: Diabetes. Abnormal heart rhythms. Thyroid problems. Low blood counts (anemia). Having a family history of heart failure. What are the signs or symptoms? Shortness of breath. Coughing. Swelling of the feet, ankles, legs, or belly. Losing or gaining weight for no reason. Trouble breathing. Waking from sleep because of the need to sit up and get more air. Fast heartbeat. Other symptoms may include: Being very tired. Feeling dizzy, or feeling like you may pass out (faint). Having no desire to eat. Feeling like you may vomit (nauseous). Peeing (urinating) more at night. Feeling confused. How is this treated? This condition may be treated with: Medicines. These can be given to treat blood pressure and to make the heart muscles stronger. Changes in your daily life. These may include: Eating a healthy diet. Staying at a healthy body weight. Quitting tobacco, alcohol, and drug use. Doing exercises. Participating in a cardiac rehabilitation program. This program helps you improve your health through exercise, education, and counseling. Surgery. Surgery can be done to open blocked valves or to put devices in the heart, such as pacemakers. A donor heart (heart transplant). You will receive a healthy heart from a donor. Follow these instructions at home: Treat other conditions as told by your doctor. These may include high blood pressure, diabetes, thyroid disease, or abnormal heart rhythms. Learn as much as you can about heart failure. Get support as you need it. Keep all follow-up visits. Where to find more information American Heart Association: www.heart.org Centers for Disease Control and Prevention: http://www.wolf.info/ National Institute on Aging:  http://kim-miller.com/ Summary Heart failure means that your heart is not able to pump blood in the right way. This condition is often caused by high blood pressure, heart attack, or damage of the heart muscle. Symptoms of this condition include shortness of breath and swelling of the feet, ankles, legs, or belly. You may also feel very tired or feel like you may vomit. You may be treated with medicines, surgery, or changes in  your daily life. Treat other health conditions as told by your doctor. This information is not intended to replace advice given to you by your health care provider. Make sure you discuss any questions you have with your health care provider. Document Revised: 02/12/2021 Document Reviewed: 03/08/2020 Elsevier Patient Education  [place], MD [facility]

## 2022-08-31 NOTE — Assessment & Plan Note (Signed)
Clinically euvolemic.  No clinical signs of CHF. Continue Entresto 97-103 mg twice daily along with Jardiance 10 mg daily, metoprolol succinate 50 mg daily and Aldactone 12.5 mg daily Only using torsemide as needed

## 2022-08-31 NOTE — Patient Instructions (Signed)
Heart Failure, Diagnosis  Heart failure means that your heart is not able to pump blood in the right way. This makes it hard for your body to work well. Heart failure is usually a long-term (chronic) condition. You must take good care of yourself and follow your treatment plan from your doctor. Different stages of heart failure have different treatment plans. The stages are: Stage A: At risk for heart failure. Stage B: Pre-heart failure. Stage C: Symptomatic heart failure. Stage D: Advanced heart failure. What are the causes? High blood pressure. Buildup of cholesterol and fat in the arteries. Heart attack. This injures the heart muscle. Heart valves that do not open and close properly. Damage of the heart muscle. This is also called cardiomyopathy. Infection of the heart muscle. This is also called myocarditis. Lung disease. What increases the risk? Getting older. The risk of heart failure goes up as a person ages. Being overweight. Using tobacco or nicotine products. Abusing alcohol or drugs. Having taken medicines that can damage the heart. Having any of these conditions: Diabetes. Abnormal heart rhythms. Thyroid problems. Low blood counts (anemia). Having a family history of heart failure. What are the signs or symptoms? Shortness of breath. Coughing. Swelling of the feet, ankles, legs, or belly. Losing or gaining weight for no reason. Trouble breathing. Waking from sleep because of the need to sit up and get more air. Fast heartbeat. Other symptoms may include: Being very tired. Feeling dizzy, or feeling like you may pass out (faint). Having no desire to eat. Feeling like you may vomit (nauseous). Peeing (urinating) more at night. Feeling confused. How is this treated? This condition may be treated with: Medicines. These can be given to treat blood pressure and to make the heart muscles stronger. Changes in your daily life. These may include: Eating a healthy  diet. Staying at a healthy body weight. Quitting tobacco, alcohol, and drug use. Doing exercises. Participating in a cardiac rehabilitation program. This program helps you improve your health through exercise, education, and counseling. Surgery. Surgery can be done to open blocked valves or to put devices in the heart, such as pacemakers. A donor heart (heart transplant). You will receive a healthy heart from a donor. Follow these instructions at home: Treat other conditions as told by your doctor. These may include high blood pressure, diabetes, thyroid disease, or abnormal heart rhythms. Learn as much as you can about heart failure. Get support as you need it. Keep all follow-up visits. Where to find more information American Heart Association: www.heart.org Centers for Disease Control and Prevention: www.cdc.gov National Institute on Aging: www.nia.nih.gov Summary Heart failure means that your heart is not able to pump blood in the right way. This condition is often caused by high blood pressure, heart attack, or damage of the heart muscle. Symptoms of this condition include shortness of breath and swelling of the feet, ankles, legs, or belly. You may also feel very tired or feel like you may vomit. You may be treated with medicines, surgery, or changes in your daily life. Treat other health conditions as told by your doctor. This information is not intended to replace advice given to you by your health care provider. Make sure you discuss any questions you have with your health care provider. Document Revised: 02/12/2021 Document Reviewed: 03/08/2020 Elsevier Patient Education  2023 Elsevier Inc.  

## 2022-08-31 NOTE — Assessment & Plan Note (Addendum)
No recent anginal episodes.  No use for nitroglycerin. Continue beta-blocker with metoprolol succinate 50 mg daily Continue daily baby aspirin

## 2022-09-03 ENCOUNTER — Ambulatory Visit (INDEPENDENT_AMBULATORY_CARE_PROVIDER_SITE_OTHER): Payer: Medicare Other

## 2022-09-03 VITALS — Ht 69.0 in | Wt 208.0 lb

## 2022-09-03 DIAGNOSIS — Z0000 Encounter for general adult medical examination without abnormal findings: Secondary | ICD-10-CM

## 2022-09-03 NOTE — Progress Notes (Signed)
Virtual Visit via Telephone Note  I connected with  Troy May on 09/03/22 at  9:15 AM EST by telephone and verified that I am speaking with the correct person using two identifiers.  Location: Patient: Home Provider: [place] Persons participating in the virtual visit: [place]   I discussed the limitations, risks, security and privacy concerns of performing an evaluation and management service by telephone and the availability of in person appointments. The patient expressed understanding and agreed to proceed.  Interactive audio and video telecommunications were attempted between this nurse and patient, however failed, due to patient having technical difficulties OR patient did not have access to video capability.  We continued and completed visit with audio only.  Some vital signs may be absent or patient reported.   Sheral Flow, LPN  Subjective:   Troy May is a 72 y.o. male who presents for an Initial Medicare Annual Wellness Visit.  Review of Systems     Cardiac Risk Factors include: advanced age (>33mn, >>87women);dyslipidemia;hypertension;family history of premature cardiovascular disease;male gender     Objective:    Today's Vitals   09/03/22 0917  Weight: 208 lb (94.3 kg)  Height: '5\' 9"'$  (1.753 m)  PainSc: 0-No pain   Body mass index is 30.72 kg/m.     09/03/2022    9:18 AM 07/25/2021    2:27 PM 07/24/2021    7:28 PM  Advanced Directives  Does Patient Have a Medical Advance Directive? No  No  Would patient like information on creating a medical advance directive? No - Patient declined No - Patient declined     Current Medications (verified) Outpatient Encounter Medications as of 09/03/2022  Medication Sig   aspirin EC 81 MG tablet Take 1 tablet (81 mg total) by mouth daily. Swallow whole.   atorvastatin (LIPITOR) 80 MG tablet Take 1 tablet (80 mg total) by mouth daily.   cetirizine (ZYRTEC) 10 MG tablet Take 10 mg by  mouth daily as needed for allergies.   empagliflozin (JARDIANCE) 10 MG TABS tablet Take 1 tablet (10 mg total) by mouth daily.   metoprolol succinate (TOPROL XL) 50 MG 24 hr tablet Take 1 tablet (50 mg total) by mouth at bedtime.   OVER THE COUNTER MEDICATION Take 1 tablet by mouth daily. gummy   rivaroxaban (XARELTO) 20 MG TABS tablet Take 1 tablet (20 mg total) by mouth daily with supper.   sacubitril-valsartan (ENTRESTO) 97-103 MG Take 1 tablet by mouth 2 (two) times daily.   spironolactone (ALDACTONE) 25 MG tablet Take 0.5 tablets (12.5 mg total) by mouth daily.   torsemide (DEMADEX) 20 MG tablet Take 1 tablet (20 mg total) by mouth daily as needed (fluid retention).   No facility-administered encounter medications on file as of 09/03/2022.    Allergies (verified) Patient has no known allergies.   History: Past Medical History:  Diagnosis Date   Ascending aortic aneurysm (HCC)    Bilateral renal masses    CAD (coronary artery disease)    CHF (congestive heart failure) (HCC)    HLD (hyperlipidemia)    Hypertension    LV (left ventricular) mural thrombus    Systolic heart failure (HCC)    Past Surgical History:  Procedure Laterality Date   CARDIAC CATHETERIZATION     RIGHT/LEFT HEART CATH AND CORONARY ANGIOGRAPHY N/A 07/27/2021   Procedure: RIGHT/LEFT HEART CATH AND CORONARY ANGIOGRAPHY;  Surgeon: BJolaine Artist MD;  [facility];  Service: Cardiovascular;  Laterality: N/A;  Family History  Problem Relation Age of Onset   Hypertension Brother    Heart failure Neg Hx    Social History   Socioeconomic History   Marital status: Married    Spouse name: Not on file   Number of children: 2   Years of education: 12   Highest education level: High school graduate  Occupational History   Occupation: Retired  Tobacco Use   Smoking status: Former    Types: Cigarettes    Quit date: 07/27/2021    Years since quitting: 1.1   Smokeless tobacco: Not on file   Substance and Sexual Activity   Alcohol use: Not Currently   Drug use: Never   Sexual activity: Not on file  Other Topics Concern   Not on file  Social History Narrative   Not on file   Social Determinants of Health   Financial Resource Strain: Low Risk  (09/03/2022)   Overall Financial Resource Strain (CARDIA)    Difficulty of Paying Living Expenses: Not hard at all  Food Insecurity: No Food Insecurity (09/03/2022)   Hunger Vital Sign    Worried About Running Out of Food in the Last Year: Never true    [place] in the Last Year: Never true  Transportation Needs: No Transportation Needs (09/03/2022)   PRAPARE - Hydrologist (Medical): No    Lack of Transportation (Non-Medical): No  Physical Activity: Sufficiently Active (09/03/2022)   Exercise Vital Sign    Days of Exercise per Week: 5 days    Minutes of Exercise per Session: 30 min  Stress: No Stress Concern Present (09/03/2022)   [place]    Feeling of Stress : Not at all  Social Connections: [place] (09/03/2022)   Social Connection and Isolation Panel [NHANES]    Frequency of Communication with Friends and Family: More than three times a week    Frequency of Social Gatherings with Friends and Family: More than three times a week    Attends Religious Services: More than 4 times per year    Active Member of [employer] or Organizations: Yes    Attends Music therapist: More than 4 times per year    Marital Status: Married    Tobacco Counseling Counseling given: Not Answered   Clinical Intake:  Pre-visit preparation completed: Yes  Pain : No/denies pain Pain Score: 0-No pain     BMI - recorded: 30.72 Nutritional Status: BMI > 30  Obese Nutritional Risks: None Diabetes: No  How often do you need to have someone help you when you read instructions, pamphlets, or other written materials from your doctor  or pharmacy?: 1 - Never What is the last grade level you completed in school?: HSG  Diabetic? No  Interpreter Needed?: No  Information entered by :: Lisette Abu, LPN.   Activities of Daily Living    09/03/2022    9:20 AM  In your present state of health, do you have any difficulty performing the following activities:  Hearing? 0  Vision? 0  Difficulty concentrating or making decisions? 0  Walking or climbing stairs? 0  Dressing or bathing? 0  Doing errands, shopping? 0  Preparing Food and eating ? N  Using the Toilet? N  In the past six months, have you accidently leaked urine? N  Do you have problems with loss of bowel control? N  Managing your Medications? N  Managing your Finances? N  Housekeeping or managing your Housekeeping? N    Patient Care Team: Horald Pollen, MD as PCP - General (Internal Medicine)  Indicate any recent Medical Services you may have received from other than [facility] providers in the past year (date may be approximate).     Assessment:   This is a routine wellness examination for [place].  Hearing/Vision screen Hearing Screening - Comments:: Denies hearing difficulties   Vision Screening - Comments:: Wear reading glasses - not up to date with routine eye exams.    Dietary issues and exercise activities discussed: Current Exercise Habits: The patient does not participate in regular exercise at present;Home exercise routine, Type of exercise: walking, Time (Minutes): 30, Frequency (Times/Week): 5, Weekly Exercise (Minutes/Week): 150, Intensity: Moderate, Exercise limited by: cardiac condition(s)   Goals Addressed             This Visit's Progress    My goal for 2024 is to eliminate some of my medications.        Depression Screen    09/03/2022    9:19 AM 08/31/2022    1:44 PM 06/02/2022   11:45 AM 04/01/2022    1:52 PM 02/08/2022   10:11 AM  PHQ 2/9 Scores  PHQ - 2 Score 0 0 0 0 0    Fall Risk    09/03/2022    9:19 AM 08/31/2022     1:44 PM 04/01/2022    3:52 PM 02/08/2022   10:11 AM  [place] in the past year? 0 0 0 0  Number falls in past yr: 0 0 0   Injury with Fall? 0 0 0   Risk for fall due to : No Fall Risks No Fall Risks No Fall Risks   Follow up Falls prevention discussed Falls evaluation completed Falls evaluation completed     [place]:  Any stairs in or around the home? Yes  If so, are there any without handrails? No  Home free of loose throw rugs in walkways, pet beds, electrical cords, etc? Yes  Adequate lighting in your home to reduce risk of falls? Yes   ASSISTIVE DEVICES UTILIZED TO PREVENT FALLS:  Life alert? No  Use of a cane, walker or w/c? No  Grab bars in the bathroom? No  Shower chair or bench in shower? No  Elevated toilet seat or a handicapped toilet? No   TIMED UP AND GO:  Was the test performed? No . Phone Visit  Cognitive Function:        09/03/2022    9:19 AM  6CIT Screen  What Year? 0 points  What month? 0 points  What time? 0 points  Count back from 20 0 points  Months in reverse 0 points  Repeat phrase 0 points  Total Score 0 points    Immunizations Immunization History  Administered Date(s) Administered   PFIZER(Purple Top)SARS-COV-2 Vaccination 09/25/2019, 10/16/2019, 06/06/2020   Zoster Recombinat (Shingrix) 04/09/2022, 06/25/2022    TDAP status: Declined, Education has been provided regarding the importance of this vaccine but patient still declined. Advised may receive this vaccine at local pharmacy or Health Dept. Aware to provide a copy of the vaccination record if obtained from local pharmacy or Health Dept. Verbalized acceptance and understanding.  Flu Vaccine status: Declined, Education has been provided regarding the importance of this vaccine but patient still declined. Advised may receive this vaccine at local pharmacy or Health Dept. Aware to provide a copy of the  vaccination record if obtained from local  pharmacy or Health Dept. Verbalized acceptance and understanding.  Pneumococcal vaccine status: Declined,  Education has been provided regarding the importance of this vaccine but patient still declined. Advised may receive this vaccine at local pharmacy or Health Dept. Aware to provide a copy of the vaccination record if obtained from local pharmacy or Health Dept. Verbalized acceptance and understanding.   Covid-19 vaccine status: Completed vaccines  Qualifies for Shingles Vaccine? Yes   Zostavax completed No   Shingrix Completed?: No.    Education has been provided regarding the importance of this vaccine. Patient has been advised to call insurance company to determine out of pocket expense if they have not yet received this vaccine. Advised may also receive vaccine at local pharmacy or Health Dept. Verbalized acceptance and understanding.  Screening Tests Health Maintenance  Topic Date Due   Hepatitis C Screening  Never done   COLONOSCOPY (Pts 45-51yr Insurance coverage will need to be confirmed)  Never done   INFLUENZA VACCINE  Never done   COVID-19 Vaccine (4 - 2023-24 season) 04/30/2022   Pneumonia Vaccine 72 Years old (1 - PCV) 09/01/2023 (Originally 01/14/2016)   Medicare Annual Wellness (AWV)  09/04/2023   Zoster Vaccines- Shingrix  Completed   HPV VACCINES  Aged Out   DTaP/Tdap/Td  Discontinued    Health Maintenance  Health Maintenance Due  Topic Date Due   Hepatitis C Screening  Never done   COLONOSCOPY (Pts 45-410yrInsurance coverage will need to be confirmed)  Never done   INFLUENZA VACCINE  Never done   COVID-19 Vaccine (4 - 2023-24 season) 04/30/2022    Colorectal cancer screening: Type of screening: FOBT/FIT. Completed 04/21/2022. Repeat every 1 years  Lung Cancer Screening: (Low Dose CT Chest recommended if Age 72-80ears, 30 pack-year currently smoking OR have quit w/in 15years.) does not qualify.   Lung Cancer Screening Referral: no  Additional  Screening:  Hepatitis C Screening: does qualify; Completed no  Vision Screening: Recommended annual ophthalmology exams for early detection of glaucoma and other disorders of the eye. Is the patient up to date with their annual eye exam?  No  Who is the provider or what is the name of the office in which the patient attends annual eye exams? Declined If pt is not established with a provider, would they like to be referred to a provider to establish care? No .   Dental Screening: Recommended annual dental exams for proper oral hygiene  Community Resource Referral / Chronic Care Management: CRR required this visit?  No   CCM required this visit?  No      Plan:     I have personally reviewed and noted the following in the patient's chart:   Medical and social history Use of alcohol, tobacco or illicit drugs  Current medications and supplements including opioid prescriptions. Patient is not currently taking opioid prescriptions. Functional ability and status Nutritional status Physical activity Advanced directives List of other physicians Hospitalizations, surgeries, and ER visits in previous 12 months Vitals Screenings to include cognitive, depression, and falls Referrals and appointments  In addition, I have reviewed and discussed with patient certain preventive protocols, quality metrics, and best practice recommendations. A written personalized care plan for preventive services as well as general preventive health recommendations were provided to patient.     ShSheral FlowLPN   1/10/30/2023 Nurse Notes: N/A

## 2022-09-03 NOTE — Patient Instructions (Signed)
Mr. Troy May , Thank you for taking time to come for your Medicare Wellness Visit. I appreciate your ongoing commitment to your health goals. Please review the following plan we discussed and let me know if I can assist you in the future.   These are the goals we discussed:  Goals      My goal for 2024 is to eliminate some of my medications.        This is a list of the screening recommended for you and due dates:  Health Maintenance  Topic Date Due   Hepatitis C Screening: USPSTF Recommendation to screen - Ages 41-79 yo.  Never done   Colon Cancer Screening  Never done   Flu Shot  Never done   COVID-19 Vaccine (4 - 2023-24 season) 04/30/2022   Pneumonia Vaccine (1 - PCV) 09/01/2023*   Medicare Annual Wellness Visit  09/04/2023   Zoster (Shingles) Vaccine  Completed   HPV Vaccine  Aged Out   DTaP/Tdap/Td vaccine  Discontinued  *Topic was postponed. The date shown is not the original due date.    Advanced directives: No  Conditions/risks identified: Yes  Next appointment: Follow up in one year for your annual wellness visit.   Preventive Care 85 Years and Older, Male  Preventive care refers to lifestyle choices and visits with your health care provider that can promote health and wellness. What does preventive care include? A yearly physical exam. This is also called an annual well check. Dental exams once or twice a year. Routine eye exams. Ask your health care provider how often you should have your eyes checked. Personal lifestyle choices, including: Daily care of your teeth and gums. Regular physical activity. Eating a healthy diet. Avoiding tobacco and drug use. Limiting alcohol use. Practicing safe sex. Taking low doses of aspirin every day. Taking vitamin and mineral supplements as recommended by your health care provider. What happens during an annual well check? The services and screenings done by your health care provider during your annual well check will  depend on your age, overall health, lifestyle risk factors, and family history of disease. Counseling  Your health care provider may ask you questions about your: Alcohol use. Tobacco use. Drug use. Emotional well-being. Home and relationship well-being. Sexual activity. Eating habits. History of falls. Memory and ability to understand (cognition). Work and work Statistician. Screening  You may have the following tests or measurements: Height, weight, and BMI. Blood pressure. Lipid and cholesterol levels. These may be checked every 5 years, or more frequently if you are over 26 years old. Skin check. Lung cancer screening. You may have this screening every year starting at age 30 if you have a 30-pack-year history of smoking and currently smoke or have quit within the past 15 years. Fecal occult blood test (FOBT) of the stool. You may have this test every year starting at age 64. Flexible sigmoidoscopy or colonoscopy. You may have a sigmoidoscopy every 5 years or a colonoscopy every 10 years starting at age 63. Prostate cancer screening. Recommendations will vary depending on your family history and other risks. Hepatitis C blood test. Hepatitis B blood test. Sexually transmitted disease (STD) testing. Diabetes screening. This is done by checking your blood sugar (glucose) after you have not eaten for a while (fasting). You may have this done every 1-3 years. Abdominal aortic aneurysm (AAA) screening. You may need this if you are a current or former smoker. Osteoporosis. You may be screened starting at age 52 if you  are at high risk. Talk with your health care provider about your test results, treatment options, and if necessary, the need for more tests. Vaccines  Your health care provider may recommend certain vaccines, such as: Influenza vaccine. This is recommended every year. Tetanus, diphtheria, and acellular pertussis (Tdap, Td) vaccine. You may need a Td booster every 10  years. Zoster vaccine. You may need this after age 73. Pneumococcal 13-valent conjugate (PCV13) vaccine. One dose is recommended after age 68. Pneumococcal polysaccharide (PPSV23) vaccine. One dose is recommended after age 33. Talk to your health care provider about which screenings and vaccines you need and how often you need them. This information is not intended to replace advice given to you by your health care provider. Make sure you discuss any questions you have with your health care provider. Document Released: 09/12/2015 Document Revised: 05/05/2016 Document Reviewed: 06/17/2015 Elsevier Interactive Patient Education  2017 [place] Prevention in the Home Falls can cause injuries. They can happen to people of all ages. There are many things you can do to make your home safe and to help prevent falls. What can I do on the outside of my home? Regularly fix the edges of walkways and driveways and fix any cracks. Remove anything that might make you trip as you walk through a door, such as a raised step or threshold. Trim any bushes or trees on the path to your home. Use bright outdoor lighting. Clear any walking paths of anything that might make someone trip, such as rocks or tools. Regularly check to see if handrails are loose or broken. Make sure that both sides of any steps have handrails. Any raised decks and porches should have guardrails on the edges. Have any leaves, snow, or ice cleared regularly. Use sand or salt on walking paths during winter. Clean up any spills in your garage right away. This includes oil or grease spills. What can I do in the bathroom? Use night lights. Install grab bars by the toilet and in the tub and shower. Do not use towel bars as grab bars. Use non-skid mats or decals in the tub or shower. If you need to sit down in the shower, use a plastic, non-slip stool. Keep the floor dry. Clean up any water that spills on the floor as soon as it  happens. Remove soap buildup in the tub or shower regularly. Attach bath mats securely with double-sided non-slip rug tape. Do not have throw rugs and other things on the floor that can make you trip. What can I do in the bedroom? Use night lights. Make sure that you have a light by your bed that is easy to reach. Do not use any sheets or blankets that are too big for your bed. They should not hang down onto the floor. Have a firm chair that has side arms. You can use this for support while you get dressed. Do not have throw rugs and other things on the floor that can make you trip. What can I do in the kitchen? Clean up any spills right away. Avoid walking on wet floors. Keep items that you use a lot in easy-to-reach places. If you need to reach something above you, use a strong step stool that has a grab bar. Keep electrical cords out of the way. Do not use floor polish or wax that makes floors slippery. If you must use wax, use non-skid floor wax. Do not have throw rugs and other things on the  floor that can make you trip. What can I do with my stairs? Do not leave any items on the stairs. Make sure that there are handrails on both sides of the stairs and use them. Fix handrails that are broken or loose. Make sure that handrails are as long as the stairways. Check any carpeting to make sure that it is firmly attached to the stairs. Fix any carpet that is loose or worn. Avoid having throw rugs at the top or bottom of the stairs. If you do have throw rugs, attach them to the floor with carpet tape. Make sure that you have a light switch at the top of the stairs and the bottom of the stairs. If you do not have them, ask someone to add them for you. What else can I do to help prevent falls? Wear shoes that: Do not have high heels. Have rubber bottoms. Are comfortable and fit you well. Are closed at the toe. Do not wear sandals. If you use a stepladder: Make sure that it is fully opened.  Do not climb a closed stepladder. Make sure that both sides of the stepladder are locked into place. Ask someone to hold it for you, if possible. Clearly mark and make sure that you can see: Any grab bars or handrails. First and last steps. Where the edge of each step is. Use tools that help you move around (mobility aids) if they are needed. These include: Canes. Walkers. Scooters. Crutches. Turn on the lights when you go into a dark area. Replace any light bulbs as soon as they burn out. Set up your furniture so you have a clear path. Avoid moving your furniture around. If any of your floors are uneven, fix them. If there are any pets around you, be aware of where they are. Review your medicines with your doctor. Some medicines can make you feel dizzy. This can increase your chance of falling. Ask your doctor what other things that you can do to help prevent falls. This information is not intended to replace advice given to you by your health care provider. Make sure you discuss any questions you have with your health care provider. Document Released: 06/12/2009 Document Revised: 01/22/2016 Document Reviewed: 09/20/2014 Elsevier Interactive Patient Education  2017 [employer].

## 2022-10-28 ENCOUNTER — Other Ambulatory Visit: Payer: Self-pay | Admitting: Urology

## 2022-10-28 DIAGNOSIS — D49512 Neoplasm of unspecified behavior of left kidney: Secondary | ICD-10-CM

## 2022-11-10 ENCOUNTER — Ambulatory Visit
Admission: RE | Admit: 2022-11-10 | Discharge: 2022-11-10 | Disposition: A | Discharge status: Home / Self Care | Payer: Medicare Other | Source: Ambulatory Visit | Attending: Urology | Admitting: Urology

## 2022-11-10 DIAGNOSIS — D49512 Neoplasm of unspecified behavior of left kidney: Secondary | ICD-10-CM

## 2022-11-10 MED ORDER — GADOPICLENOL 0.5 MMOL/ML IV SOLN
9.0000 mL | Freq: Once | INTRAVENOUS | Status: AC | PRN
  Administered 2022-11-10: 9 mL via INTRAVENOUS

## 2022-11-25 ENCOUNTER — Other Ambulatory Visit: Payer: Self-pay | Admitting: Urology

## 2022-11-25 ENCOUNTER — Other Ambulatory Visit (HOSPITAL_COMMUNITY): Payer: Self-pay | Admitting: Family Medicine

## 2022-11-25 ENCOUNTER — Telehealth (HOSPITAL_COMMUNITY): Payer: Self-pay

## 2022-11-25 DIAGNOSIS — I5082 Biventricular heart failure: Secondary | ICD-10-CM

## 2022-11-25 NOTE — Telephone Encounter (Signed)
Received Cardiac Clearance form from [facility] specialist requesting patient be cleared for the following procedure left partial nephrectomy. Need to know if its ok to hold Xarelto 20mg  and Aspirin 81mg  for an average of 5 days Form was placed in Dr. Nicholes Calamity folder on 28/Mar/2024 for signature. Will update chart once clearance has been reviewed and signed by provider.

## 2022-12-13 NOTE — Telephone Encounter (Signed)
Surgical clearance form faxed on 12/13/22

## 2022-12-15 NOTE — Telephone Encounter (Signed)
Xarelto and ASA verbally discussed with Dr Gala Romney Per VO ok to hold both xarelto and Aspirin x 5 days prior  Information given to [facility] at [facility] [phone]

## 2022-12-16 ENCOUNTER — Encounter: Payer: Self-pay | Admitting: Pharmacist

## 2022-12-16 NOTE — Progress Notes (Signed)
[facility])     [facility] Quality Pharmacy Team Statin Quality Measure Assessment  12/16/2022  Troy May May 29, 1951 [id number]  Per review of chart and payor information, [name] has a diagnosis of cardiovascular disease but is not currently filling a statin prescription. This places patient into the [facility] (Statin Use In Patients with Cardiovascular Disease) measure for CMS.    [name] has been prescribed atorvastatin in the past but has not filled since August 2023.  If clinically appropriate, please discuss statin compliance at tomorrow's office visit or if he has developed an intolerance, please code accordingly.  Please consider ONE of the following recommendations:  Initiate high intensity statin Atorvastatin  once daily, #90, 3 refills   Rosuvastatin  once daily, #90, 3 refills    Initiate moderate intensity  statin with reduced frequency if prior  statin intolerance 1x weekly, #13, 3 refills   2x weekly, #26, 3 refills   3x weekly, #39, 3 refills    Code for past statin intolerance  (required annually)  Provider Requirements: Must associate code during an office visit or telehealth encounter   Drug Induced Myopathy G72.0   Myalgia (SPC ONLY) M79.1   Myositis, unspecified M60.9   Myopathy, unspecified G72.9   Rhabdomyolysis M62.82   Thank you for allowing [facility] to be a part of this patient's care.  Dellie Burns, PharmD [facility]  [facility] Clinical Pharmacist Office: [phone]

## 2022-12-17 ENCOUNTER — Ambulatory Visit (HOSPITAL_COMMUNITY)
Admission: RE | Admit: 2022-12-17 | Discharge: 2022-12-17 | Disposition: A | Discharge status: Home / Self Care | Payer: Medicare Other | Source: Ambulatory Visit | Attending: Internal Medicine | Admitting: Internal Medicine

## 2022-12-17 ENCOUNTER — Encounter (HOSPITAL_COMMUNITY): Payer: Self-pay | Admitting: Internal Medicine

## 2022-12-17 VITALS — BP 128/80 | HR 60 | Wt 203.0 lb

## 2022-12-17 DIAGNOSIS — Z79899 Other long term (current) drug therapy: Secondary | ICD-10-CM | POA: Insufficient documentation

## 2022-12-17 DIAGNOSIS — E785 Hyperlipidemia, unspecified: Secondary | ICD-10-CM | POA: Diagnosis not present

## 2022-12-17 DIAGNOSIS — I5022 Chronic systolic (congestive) heart failure: Secondary | ICD-10-CM

## 2022-12-17 DIAGNOSIS — Z7901 Long term (current) use of anticoagulants: Secondary | ICD-10-CM | POA: Insufficient documentation

## 2022-12-17 DIAGNOSIS — Z7982 Long term (current) use of aspirin: Secondary | ICD-10-CM | POA: Insufficient documentation

## 2022-12-17 DIAGNOSIS — I255 Ischemic cardiomyopathy: Secondary | ICD-10-CM | POA: Insufficient documentation

## 2022-12-17 DIAGNOSIS — I130 Hypertensive heart and chronic kidney disease with heart failure and stage 1 through stage 4 chronic kidney disease, or unspecified chronic kidney disease: Secondary | ICD-10-CM | POA: Diagnosis not present

## 2022-12-17 DIAGNOSIS — I513 Intracardiac thrombosis, not elsewhere classified: Secondary | ICD-10-CM

## 2022-12-17 DIAGNOSIS — I7121 Aneurysm of the ascending aorta, without rupture: Secondary | ICD-10-CM

## 2022-12-17 DIAGNOSIS — I5082 Biventricular heart failure: Secondary | ICD-10-CM | POA: Insufficient documentation

## 2022-12-17 DIAGNOSIS — I2510 Atherosclerotic heart disease of native coronary artery without angina pectoris: Secondary | ICD-10-CM | POA: Diagnosis not present

## 2022-12-17 DIAGNOSIS — N1831 Chronic kidney disease, stage 3a: Secondary | ICD-10-CM | POA: Insufficient documentation

## 2022-12-17 DIAGNOSIS — Z7984 Long term (current) use of oral hypoglycemic drugs: Secondary | ICD-10-CM | POA: Diagnosis not present

## 2022-12-17 DIAGNOSIS — Z87891 Personal history of nicotine dependence: Secondary | ICD-10-CM | POA: Diagnosis not present

## 2022-12-17 DIAGNOSIS — Z01810 Encounter for preprocedural cardiovascular examination: Secondary | ICD-10-CM

## 2022-12-17 LAB — BASIC METABOLIC PANEL
Anion gap: 7 (ref 5–15)
BUN: 18 mg/dL (ref 8–23)
CO2: 24 mmol/L (ref 22–32)
Calcium: 8.8 mg/dL — ABNORMAL LOW (ref 8.9–10.3)
Chloride: 110 mmol/L (ref 98–111)
Creatinine, Ser: 1.41 mg/dL — ABNORMAL HIGH (ref 0.61–1.24)
GFR, Estimated: 53 mL/min — ABNORMAL LOW (ref >60)
Glucose, Bld: 99 mg/dL (ref 70–99)
Potassium: 4.2 mmol/L (ref 3.5–5.1)
Sodium: 141 mmol/L (ref 135–145)

## 2022-12-17 LAB — BRAIN NATRIURETIC PEPTIDE: B Natriuretic Peptide: 545.9 pg/mL — ABNORMAL HIGH (ref 0.0–100.0)

## 2022-12-17 NOTE — Addendum Note (Signed)
Encounter addended by: Linda Hedges, RN on: 12/17/2022 9:37 AM  Actions taken: Order list changed, Diagnosis association updated, Clinical Note Signed, Charge Capture section accepted

## 2022-12-17 NOTE — Progress Notes (Addendum)
ADVANCED HF CLINIC  NOTE  Primary Care: Edwina Barth, MD HF Cardiologist:  Dr. Gala Romney  HPI: Troy May is a 72 y.o. male w/ HTN, HLD, CAD, and BiV CHF/iCM.  Admitted 11/22 w/ CP, Influenza A+. Hypertensive on arrival requiring labetalol gtt, CT chest - PE but + for cardiomegaly, coronary CA2+, mild aneurysmal dilatation of the ascending thoracic aorta 43 mm; bilateral renal masses suspicious for RCC.  Echo 11/22 EF 25-30% w/LV apical thrombus G2DD, RV moderately HK. R/LHC severe 2V CAD w/ occluded LAD & RCA,  elevated filling pressures w/ low output, CI 1.7. No good options for revasc -> med rx. cMRI LVEF 21%, LAD/RCA territories with scar and do not appear viable, large LV thrombus. Discharge weight 172 lbs.  Echo 3/23: EF 25-30%, + apical thrombus, moderate MR/TR  Follow up 3/23. Toprol XL 25 added. Referred to EP for ICD. Saw Dr. Graciela Husbands for evaluation 12/11/21. Wanted to think about ICD.  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 Follows with Urology q 6 months for surveillance.  Follow up 6/23, off all meds for a few weeks. GDMT restarted. Declined ICD.  Echo 11/23: EF 25-30% RV ok AoRoot 4.1cm  Today he returns for HF follow up with his wife. Remains relatively active. No CP or SOB. Doesn't require any diuretic. Compliant with all meds.   Has surgery on May 10 to resect RCC with Dr. Marlou Porch     Cardiac Studies:  - cMRI (11/22): EF 21% and minimal viability in RCA and LAD territories  - [facility] 07/27/21:   Ost RCA to Prox RCA lesion is 100% stenosed.   Prox Cx to Mid Cx lesion is 30% stenosed.   1st Mrg lesion is 20% stenosed.   Prox LAD lesion is 60% stenosed.   Mid LAD lesion is 90% stenosed.   Mid LAD to Dist LAD lesion is 100% stenosed.   Findings: Ao = 143/88 (109)  LV = 146/25 RA =  8 RV = 41/12 PA = 45/20 (29) PCW = 21 Fick cardiac output/index = 3.4/1.7 PVR = 2.4 WU SVR = 2383  FA sat =  95% PA sat = 54%, 57% PAPi 3.125     Past Medical History:  Diagnosis Date   Ascending aortic aneurysm    Bilateral renal masses    CAD (coronary artery disease)    CHF (congestive heart failure)    HLD (hyperlipidemia)    Hypertension    LV (left ventricular) mural thrombus    Systolic heart failure    Current Outpatient Medications  Medication Sig Dispense Refill   aspirin EC 81 MG tablet Take 1 tablet (81 mg total) by mouth daily. Swallow whole. 30 tablet 11   atorvastatin (LIPITOR) 80 MG tablet Take 1 tablet (80 mg total) by mouth daily. 30 tablet 11   cetirizine (ZYRTEC) 10 MG tablet Take 10 mg by mouth daily as needed for allergies.     empagliflozin (JARDIANCE) 10 MG TABS tablet Take 1 tablet (10 mg total) by mouth daily. 30 tablet 11   metoprolol succinate (TOPROL XL) 50 MG 24 hr tablet Take 1 tablet (50 mg total) by mouth at bedtime. 30 tablet 11   OVER THE COUNTER MEDICATION Take 1 tablet by mouth daily. gummy     rivaroxaban (XARELTO) 20 MG TABS tablet Take 1 tablet (20 mg total) by mouth daily with supper. 30 tablet 11   sacubitril-valsartan (ENTRESTO) 97-103 MG Take  1 tablet by mouth 2 (two) times daily. 180 tablet 3   spironolactone (ALDACTONE) 25 MG tablet TAKE 1/2 TABLET BY MOUTH EVERY DAY 45 tablet 1   torsemide (DEMADEX) 20 MG tablet Take 1 tablet (20 mg total) by mouth daily as needed (fluid retention). 30 tablet 6   No current facility-administered medications for this encounter.   No Known Allergies  Social History   Socioeconomic History   Marital status: Married    Spouse name: Not on file   Number of children: 2   Years of education: 12   Highest education level: High school graduate  Occupational History   Occupation: Retired  Tobacco Use   Smoking status: Former    Types: Cigarettes    Quit date: 07/27/2021    Years since quitting: 1.3   Smokeless tobacco: Not on file  Substance and Sexual Activity   Alcohol use: Not Currently   Drug use:  Never   Sexual activity: Not on file  Other Topics Concern   Not on file  Social History Narrative   Not on file   Social Determinants of Health   Financial Resource Strain: Low Risk  (09/03/2022)   Overall Financial Resource Strain (CARDIA)    Difficulty of Paying Living Expenses: Not hard at all  Food Insecurity: No Food Insecurity (09/03/2022)   Hunger Vital Sign    Worried About Running Out of Food in the Last Year: Never true    Ran Out of Food in the Last Year: Never true  Transportation Needs: No Transportation Needs (09/03/2022)   PRAPARE - Administrator, Civil Service (Medical): No    Lack of Transportation (Non-Medical): No  Physical Activity: Sufficiently Active (09/03/2022)   Exercise Vital Sign    Days of Exercise per Week: 5 days    Minutes of Exercise per Session: 30 min  Stress: No Stress Concern Present (09/03/2022)   [employer] of Occupational Health - Occupational Stress Questionnaire    Feeling of Stress : Not at all  Social Connections: Socially Integrated (09/03/2022)   Social Connection and Isolation Panel [NHANES]    Frequency of Communication with Friends and Family: More than three times a week    Frequency of Social Gatherings with Friends and Family: More than three times a week    Attends Religious Services: More than 4 times per year    Active Member of [employer] or Organizations: Yes    Attends Engineer, structural: More than 4 times per year    Marital Status: Married  Catering manager Violence: Not At Risk (09/03/2022)   Humiliation, Afraid, Rape, and Kick questionnaire    Fear of Current or Ex-Partner: No    Emotionally Abused: No    Physically Abused: No    Sexually Abused: No   Family History  Problem Relation Age of Onset   Hypertension Brother    Heart failure Neg Hx    BP 128/80   Pulse 60   Wt 92.1 kg (203 lb)   SpO2 96%   BMI 29.98 kg/m   Wt Readings from Last 3 Encounters:  12/17/22 92.1 kg (203 lb)  09/03/22  94.3 kg (208 lb)  08/31/22 94.1 kg (207 lb 8 oz)   PHYSICAL EXAM: General:  Well appearing. No resp difficulty HEENT: normal Neck: supple. no JVD. Carotids 2+ bilat; no bruits. No lymphadenopathy or thryomegaly appreciated. Cor: PMI nondisplaced. Regular rate & rhythm. No rubs, gallops or murmurs. Lungs: clear Abdomen: soft, nontender, nondistended.  No hepatosplenomegaly. No bruits or masses. Good bowel sounds. Extremities: no cyanosis, clubbing, rash, edema Neuro: alert & orientedx3, cranial nerves grossly intact. moves all 4 extremities w/o difficulty. 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 - Echo (10/30/21): EF 25-30% + apical clot. Moderate MR/TR. - Echo 11/23 EF 25-20% RV ok - NYHA I volume ok - Continue Entresto 97/103 mg bid. - Continue spiro 12.5 mg daily.  - Continue torsemide 20 mg PRN. Has not needed recently. - Continue Jardiance 10 mg daily.  - Continue Toprol XL 50 mg q hs. - Has seen EP. Declines ICD. - No room to titrate GDMT at this point. Continue current therapy   2. CAD - Cath 11/22 severe 2V CAD, mid-distal LAD 100% stenosed, ost-prox RCA 100% stenosed  - cMRI 11/22  EF 21% and larg  inferior and apical scars with minimal viability in the RCA/LAD territories. Large LV clot.  - No options for revascularization, continue medical therapy. - No s/s angina - Continue ASA 81 mg daily + atorvastatin 80 + ? blocker. - Finished CR.   3. LV Thrombus  - 2/2 severe LV dysfunction  - Continue Xarelto 20 mg daily, Consider cutting back to 15 mg daily if CrCl consistently < 50 ml/min (current CrCl 71 ml/min)   4. Bilateral Renal Masses,  suspicion for RCC - Incidental finding on chest CT. Also noted on recent MRI abdomen. - Not amenable to coiling per IR. - Following with Urology. - Pending resection 01/07/23    5. Hypertension  - BP ok. - GDMT as above.  6. Ascending Thoracic Aortic Aneurysm - 43 mm in diameter on chest CT - Asc Ao 4.0 cm on echo 03/23 stable at 4.1 on echo 11/23 - Continue to follow  7. CKD 3a - continue SGLT2i  8. Pre-operative CV evaluation - Moderate risk for peri-op CV complications. OK to proceed - OK to hold Xarelto for 2-3 days prior to surgery  Arvilla Meres, MD  9:32 AM

## 2022-12-17 NOTE — Patient Instructions (Signed)
There has been no changes to your medications.  Labs done today, your results will be available in MyChart, we will contact you for abnormal readings.  Your physician recommends that you schedule a follow-up appointment in: 6 months ( October ) ** please call the office in August to arrange your follow up appointment. **  If you have any questions or concerns before your next appointment please send us a message through mychart or call our office at [phone].    TO LEAVE A MESSAGE FOR THE NURSE SELECT OPTION 2, PLEASE LEAVE A MESSAGE INCLUDING: YOUR NAME DATE OF BIRTH CALL BACK NUMBER REASON FOR CALL**this is important as we prioritize the call backs  YOU WILL RECEIVE A CALL BACK THE SAME DAY AS LONG AS YOU CALL BEFORE 4:00 PM  At the [facility], you and your health needs are our priority. As part of our continuing mission to provide you with exceptional heart care, we have created designated Provider Care Teams. These Care Teams include your primary Cardiologist (physician) and Advanced Practice Providers (APPs- Physician Assistants and Nurse Practitioners) who all work together to provide you with the care you need, when you need it.   You may see any of the following providers on your designated Care Team at your next follow up: Dr Daniel Bensimhon Dr Dalton McLean Dr. Aditya Sabharwal Amy Clegg, NP Brittainy Simmons, PA Jessica Milford,NP Lindsay Finch, PA Alma Diaz, NP Lauren Kemp, PharmD   Please be sure to bring in all your medications bottles to every appointment.    Thank you for choosing [facility]    

## 2022-12-28 NOTE — Progress Notes (Addendum)
Anesthesia Review:  PCP: DR Alvy Bimler LOV 08/31/22  Cardiologist : DR Arvilla Meres- LOV 12/17/22  Chest x-ray : EKG : 12/17/22  Echo : 07/02/22  Cardiac MRi- 07/28/21  Stress test: Cardiac Cath :  07/27/21  Activity level:  can do a flight of stairs without difficutly  Sleep Study/ CPAP : none  Fasting Blood Sugar :      / Checks Blood Sugar -- times a day:   Blood Thinner/ Instructions /Last Dose: ASA / Instructions/ Last Dose :  81 mg aspirin   Xarelto- last dose 01/03/23 per wife at preop    Jardiance- used for heart failiure  pt is not a diabetic.  Hold for 72 hours per [facility]- Last dose 01/03/23.    Hgba1c-12/31/22- 6.0   Wife accompanied pt to preop appt    12/17/22- BMP

## 2022-12-29 NOTE — Patient Instructions (Addendum)
SURGICAL WAITING ROOM VISITATION  Patients having surgery or a procedure may have no more than 2 support people in the waiting area - these visitors may rotate.    Children under the age of 36 must have an adult with them who is not the patient.  Due to an increase in RSV and influenza rates and associated hospitalizations, children ages 34 and under may not visit patients in [facility] hospitals.  If the patient needs to stay at the hospital during part of their recovery, the visitor guidelines for inpatient rooms apply. Pre-op nurse will coordinate an appropriate time for 1 support person to accompany patient in pre-op.  This support person may not rotate.    Please refer to the [facility] website for the visitor guidelines for Inpatients (after your surgery is over and you are in a regular room).       Your procedure is scheduled on:  01/07/2023    Report to [facility] Main Entrance    Report to admitting at  [phone]   Call this number if you have problems the morning of surgery [phone]   Clear liquid diet the day before surgery                Mix 255g of MIralax with 64 ounces of Gatorade and drink 32 ounces at 12 noon and at 6pm.  Take 2 Colace tablets at 12noon and 6 pm..  the day before srugery .     Nothing after midnite.  Nite before surgery.    Water Non-Citrus Juices (without pulp, NO RED-Apple, White grape, White cranberry) Black Coffee (NO MILK/CREAM OR CREAMERS, sugar ok)  Clear Tea (NO MILK/CREAM OR CREAMERS, sugar ok) regular and decaf                             Plain Jell-O (NO RED)                                           Fruit ices (not with fruit pulp, NO RED)                                     Popsicles (NO RED)                                                               Sports drinks like Gatorade (NO RED)                          If you have questions, please contact your surgeon's office.   FOLLOW BOWEL PREP AND ANY ADDITIONAL PRE  OP INSTRUCTIONS YOU RECEIVED FROM YOUR SURGEON'S OFFICE!!!     Oral Hygiene is also important to reduce your risk of infection.                                    Remember - BRUSH YOUR TEETH THE MORNING OF SURGERY WITH YOUR REGULAR TOOTHPASTE  DENTURES WILL BE REMOVED PRIOR TO SURGERY PLEASE DO NOT APPLY "Poly grip" OR ADHESIVES!!!   Do NOT smoke after Midnight   Take these medicines the morning of surgery with A SIP OF WATER:  none   DO NOT TAKE ANY ORAL DIABETIC MEDICATIONS DAY OF YOUR SURGERY  Bring CPAP mask and tubing day of surgery.                              You may not have any metal on your body including hair pins, jewelry, and body piercing             Do not wear make-up, lotions, powders, perfumes/cologne, or deodorant  Do not wear nail polish including gel and S&S, artificial/acrylic nails, or any other type of covering on natural nails including finger and toenails. If you have artificial nails, gel coating, etc. that needs to be removed by a nail salon please have this removed prior to surgery or surgery may need to be canceled/ delayed if the surgeon/ anesthesia feels like they are unable to be safely monitored.   Do not shave  48 hours prior to surgery.               Men may shave face and neck.   Do not bring valuables to the hospital. [place] IS NOT             RESPONSIBLE   FOR VALUABLES.   Contacts, glasses, dentures or bridgework may not be worn into surgery.   Bring small overnight bag day of surgery.   DO NOT BRING YOUR HOME MEDICATIONS TO THE HOSPITAL. PHARMACY WILL DISPENSE MEDICATIONS LISTED ON YOUR MEDICATION LIST TO YOU DURING YOUR ADMISSION IN THE HOSPITAL!    Patients discharged on the day of surgery will not be allowed to drive home.  Someone NEEDS to stay with you for the first 24 hours after anesthesia.   Special Instructions: Bring a copy of your healthcare power of attorney and living will documents the day of surgery if you haven't  scanned them before.              Please read over the following fact sheets you were given: IF YOU HAVE QUESTIONS ABOUT YOUR PRE-OP INSTRUCTIONS PLEASE CALL [phone]   If you received a COVID test during your pre-op visit  it is requested that you wear a mask when out in public, stay away from anyone that may not be feeling well and notify your surgeon if you develop symptoms. If you test positive for Covid or have been in contact with anyone that has tested positive in the last 10 days please notify you surgeon.    [place] - Preparing for Surgery Before surgery, you can play an important role.  Because skin is not sterile, your skin needs to be as free of germs as possible.  You can reduce the number of germs on your skin by washing with CHG (chlorahexidine gluconate) soap before surgery.  CHG is an antiseptic cleaner which kills germs and bonds with the skin to continue killing germs even after washing. Please DO NOT use if you have an allergy to CHG or antibacterial soaps.  If your skin becomes reddened/irritated stop using the CHG and inform your nurse when you arrive at Short Stay. Do not shave (including legs and underarms) for at least 48 hours prior to the first CHG shower.  You may shave  your face/neck. Please follow these instructions carefully:  1.  Shower with CHG Soap the night before surgery and the  morning of Surgery.  2.  If you choose to wash your hair, wash your hair first as usual with your  normal  shampoo.  3.  After you shampoo, rinse your hair and body thoroughly to remove the  shampoo.                           4.  Use CHG as you would any other liquid soap.  You can apply chg directly  to the skin and wash                       Gently with a scrungie or clean washcloth.  5.  Apply the CHG Soap to your body ONLY FROM THE NECK DOWN.   Do not use on face/ open                           Wound or open sores. Avoid contact with eyes, ears mouth and genitals (private  parts).                       Wash face,  Genitals (private parts) with your normal soap.             6.  Wash thoroughly, paying special attention to the area where your surgery  will be performed.  7.  Thoroughly rinse your body with warm water from the neck down.  8.  DO NOT shower/wash with your normal soap after using and rinsing off  the CHG Soap.                9.  Pat yourself dry with a clean towel.            10.  Wear clean pajamas.            11.  Place clean sheets on your bed the night of your first shower and do not  sleep with pets. Day of Surgery : Do not apply any lotions/deodorants the morning of surgery.  Please wear clean clothes to the hospital/surgery center.  FAILURE TO FOLLOW THESE INSTRUCTIONS MAY RESULT IN THE CANCELLATION OF YOUR SURGERY PATIENT SIGNATURE_________________________________  NURSE SIGNATURE__________________________________  ________________________________________________________________________

## 2022-12-31 ENCOUNTER — Encounter (HOSPITAL_COMMUNITY)
Admission: RE | Admit: 2022-12-31 | Discharge: 2022-12-31 | Disposition: A | Discharge status: Home / Self Care | Payer: Medicare Other | Source: Ambulatory Visit | Attending: Urology | Admitting: Urology

## 2022-12-31 ENCOUNTER — Other Ambulatory Visit: Payer: Self-pay

## 2022-12-31 ENCOUNTER — Encounter (HOSPITAL_COMMUNITY): Payer: Self-pay

## 2022-12-31 VITALS — BP 138/82 | HR 61 | Temp 98.2°F | Resp 16 | Ht 72.0 in | Wt 202.8 lb

## 2022-12-31 DIAGNOSIS — N2889 Other specified disorders of kidney and ureter: Secondary | ICD-10-CM | POA: Diagnosis not present

## 2022-12-31 DIAGNOSIS — I2510 Atherosclerotic heart disease of native coronary artery without angina pectoris: Secondary | ICD-10-CM | POA: Diagnosis not present

## 2022-12-31 DIAGNOSIS — R7989 Other specified abnormal findings of blood chemistry: Secondary | ICD-10-CM | POA: Diagnosis not present

## 2022-12-31 DIAGNOSIS — Z01812 Encounter for preprocedural laboratory examination: Secondary | ICD-10-CM | POA: Diagnosis present

## 2022-12-31 DIAGNOSIS — I110 Hypertensive heart disease with heart failure: Secondary | ICD-10-CM | POA: Insufficient documentation

## 2022-12-31 DIAGNOSIS — I714 Abdominal aortic aneurysm, without rupture, unspecified: Secondary | ICD-10-CM | POA: Insufficient documentation

## 2022-12-31 DIAGNOSIS — Z87891 Personal history of nicotine dependence: Secondary | ICD-10-CM | POA: Diagnosis not present

## 2022-12-31 DIAGNOSIS — I509 Heart failure, unspecified: Secondary | ICD-10-CM | POA: Insufficient documentation

## 2022-12-31 DIAGNOSIS — Z01818 Encounter for other preprocedural examination: Secondary | ICD-10-CM

## 2022-12-31 LAB — COMPREHENSIVE METABOLIC PANEL
ALT: 15 U/L (ref 0–44)
AST: 25 U/L (ref 15–41)
Albumin: 3.6 g/dL (ref 3.5–5.0)
Alkaline Phosphatase: 48 U/L (ref 38–126)
Anion gap: 5 (ref 5–15)
BUN: 14 mg/dL (ref 8–23)
CO2: 25 mmol/L (ref 22–32)
Calcium: 8.7 mg/dL — ABNORMAL LOW (ref 8.9–10.3)
Chloride: 108 mmol/L (ref 98–111)
Creatinine, Ser: 1.38 mg/dL — ABNORMAL HIGH (ref 0.61–1.24)
GFR, Estimated: 55 mL/min — ABNORMAL LOW (ref >60)
Glucose, Bld: 91 mg/dL (ref 70–99)
Potassium: 4.2 mmol/L (ref 3.5–5.1)
Sodium: 138 mmol/L (ref 135–145)
Total Bilirubin: 0.8 mg/dL (ref 0.3–1.2)
Total Protein: 6.8 g/dL (ref 6.5–8.1)

## 2022-12-31 LAB — CBC
HCT: 42.6 % (ref 39.0–52.0)
Hemoglobin: 13.6 g/dL (ref 13.0–17.0)
MCH: 28.3 pg (ref 26.0–34.0)
MCHC: 31.9 g/dL (ref 30.0–36.0)
MCV: 88.6 fL (ref 80.0–100.0)
Platelets: 179 10*3/uL (ref 150–400)
RBC: 4.81 MIL/uL (ref 4.22–5.81)
RDW: 16.6 % — ABNORMAL HIGH (ref 11.5–15.5)
WBC: 4.8 10*3/uL (ref 4.0–10.5)
nRBC: 0 % (ref 0.0–0.2)

## 2022-12-31 LAB — HEMOGLOBIN A1C
Hgb A1c MFr Bld: 6 % — ABNORMAL HIGH (ref 4.8–5.6)
Mean Plasma Glucose: 125.5 mg/dL

## 2023-01-03 NOTE — Progress Notes (Signed)
Anesthesia chart review   Case: [id number] Date/Time: 01/07/23 0715   Procedure: XI ROBOTIC ASSITED LEFT PARTIAL NEPHRECTOMY (Left) - 210 MINUTES   Anesthesia type: General   Pre-op diagnosis: LEFT RENAL MASS   Location: [facility] ROOM 05 / [facility]   Surgeons: Troy Fat, MD       DISCUSSION: 72 year old former smoker with history of HTN, CHF EF 25 to 30%, CAD, AAA, left renal mass scheduled for above procedure 01/07/2023 with Dr. Berniece May.  Echo 11/23: EF 25-30% RV ok AoRoot 4.1cm. Has seen EP. Declines ICD.  Cath 11/22 severe 2V CAD, mid-distal LAD 100% stenosed, ost-prox RCA 100% stenosed. No options for revascularization.   Patient last seen by cardiology 12/17/2022.  Euvolemic at this visit, no CV symptoms, BP stable, stable AAA on imaging 11/23.  Per office visit note, "- Moderate risk for peri-op CV complications. OK to proceed - OK to hold Xarelto for 2-3 days prior to surgery"  Anticipate pt can proceed with planned procedure barring acute status change.   VS: BP 138/82   Pulse 61   Temp 36.8 C (Oral)   Resp 16   Ht 6' (1.829 m)   Wt 92 kg   SpO2 97%   BMI 27.51 kg/m   PROVIDERS: Troy Quint, MD is PCP  Troy Meres, MD is cardiologist LABS: Labs reviewed: Acceptable for surgery. (all labs ordered are listed, but only abnormal results are displayed)  Labs Reviewed  CBC - Abnormal; Notable for the following components:      Result Value   RDW 16.6 (*)    All other components within normal limits  COMPREHENSIVE METABOLIC PANEL - Abnormal; Notable for the following components:   Creatinine, Ser 1.38 (*)    Calcium 8.7 (*)    GFR, Estimated 55 (*)    All other components within normal limits  HEMOGLOBIN A1C - Abnormal; Notable for the following components:   Hgb A1c MFr Bld 6.0 (*)    All other components within normal limits  TYPE AND SCREEN     IMAGES:   EKG:   CV: Echo 07/02/2022 1. The left ventricle demonstrates regional  wall motion abnormalities  (Apical infarct without LV thrombus, evidence of LAD and RCA infarct, LVEF  25-30%). There is moderate concentric left ventricular hypertrophy. Left  ventricular diastolic parameters  are consistent with Grade I diastolic dysfunction (impaired relaxation).   2. Right ventricular systolic function is normal. The right ventricular  size is normal. There is normal pulmonary artery systolic pressure.   3. Left atrial size was mildly dilated.   4. The mitral valve is normal in structure. Mild mitral valve  regurgitation. No evidence of mitral stenosis.   5. Tricuspid valve regurgitation is mild to moderate.   6. The aortic valve is tricuspid. Aortic valve regurgitation is not  visualized.   7. Aortic dilatation noted. There is mild dilatation of the ascending  aorta, measuring 41 mm.   8. The inferior vena cava is normal in size with greater than 50%  respiratory variability, suggesting right atrial pressure of 3 mmHg.  Past Medical History:  Diagnosis Date   Ascending aortic aneurysm (HCC)    Bilateral renal masses    CAD (coronary artery disease)    CHF (congestive heart failure) (HCC)    HLD (hyperlipidemia)    Hypertension    LV (left ventricular) mural thrombus    Systolic heart failure [facility])     Past Surgical History:  Procedure Laterality Date  CARDIAC CATHETERIZATION     RIGHT/LEFT HEART CATH AND CORONARY ANGIOGRAPHY N/A 07/27/2021   Procedure: RIGHT/LEFT HEART CATH AND CORONARY ANGIOGRAPHY;  Surgeon: Troy Patty, MD;  Location: [facility] INVASIVE CV LAB;  Service: Cardiovascular;  Laterality: N/A;    MEDICATIONS:  aspirin EC 81 MG tablet   atorvastatin (LIPITOR) 80 MG tablet   empagliflozin (JARDIANCE) 10 MG TABS tablet   metoprolol succinate (TOPROL XL) 50 MG 24 hr tablet   Multiple Vitamin (MULTIVITAMIN WITH MINERALS) TABS tablet   rivaroxaban (XARELTO) 20 MG TABS tablet   sacubitril-valsartan (ENTRESTO) 97-103 MG   spironolactone  (ALDACTONE) 25 MG tablet   torsemide (DEMADEX) 20 MG tablet   No current facility-administered medications for this encounter.   Troy Cipro Ward, PA-C [facility] Pre-Surgical Testing [phone]

## 2023-01-03 NOTE — Anesthesia Preprocedure Evaluation (Addendum)
Anesthesia Evaluation  Patient identified by MRN, date of birth, ID band Patient awake    Reviewed: Allergy & Precautions, NPO status , Patient's Chart, lab work & pertinent test results, reviewed documented beta blocker date and time   History of Anesthesia Complications Negative for: history of anesthetic complications  Airway Mallampati: III  TM Distance: >3 FB Neck ROM: Full    Dental  (+) Missing,    Pulmonary former smoker   Pulmonary exam normal        Cardiovascular hypertension, Pt. on medications and Pt. on home beta blockers + CAD, + Past MI and +CHF  Normal cardiovascular exam  TTE 07/02/22: Apical infarct without LV thrombus, EF 25-30%, moderate LVH, grade I diastolic dysfunction,mild LAE, mild MR, mild to moderate TR, mild dilatation of ascending aorta measuring 41mm    Neuro/Psych negative neurological ROS     GI/Hepatic negative GI ROS, Neg liver ROS,,,  Endo/Other  negative endocrine ROS    Renal/GU Left renal mass     Musculoskeletal negative musculoskeletal ROS (+)    Abdominal   Peds  Hematology negative hematology ROS (+)   Anesthesia Other Findings Day of surgery medications reviewed with patient.  Reproductive/Obstetrics                             Anesthesia Physical Anesthesia Plan  ASA: 4  Anesthesia Plan: General   Post-op Pain Management: Tylenol PO (pre-op)* and Ketamine IV*   Induction: Intravenous  PONV Risk Score and Plan: 3 and Treatment may vary due to age or medical condition, Ondansetron and Dexamethasone  Airway Management Planned: Oral ETT  Additional Equipment: Arterial line  Intra-op Plan:   Post-operative Plan: Extubation in OR  Informed Consent: I have reviewed the patients History and Physical, chart, labs and discussed the procedure including the risks, benefits and alternatives for the proposed anesthesia with the patient or  authorized representative who has indicated his/her understanding and acceptance.     Dental advisory given  Plan Discussed with: CRNA  Anesthesia Plan Comments: (See PAT note 12/31/2022)       Anesthesia Quick Evaluation

## 2023-01-07 ENCOUNTER — Other Ambulatory Visit: Payer: Self-pay

## 2023-01-07 ENCOUNTER — Ambulatory Visit (HOSPITAL_COMMUNITY): Payer: Medicare Other | Admitting: Anesthesiology

## 2023-01-07 ENCOUNTER — Inpatient Hospital Stay (HOSPITAL_COMMUNITY)
Admission: AD | Admit: 2023-01-07 | Discharge: 2023-01-18 | DRG: 656 | Disposition: A | Discharge status: Home Health | Payer: Medicare Other | Attending: Family Medicine | Admitting: Family Medicine

## 2023-01-07 ENCOUNTER — Encounter (HOSPITAL_COMMUNITY)
Admission: AD | Disposition: A | Discharge status: Home Health | Payer: Self-pay | Source: Home / Self Care | Attending: Urology

## 2023-01-07 ENCOUNTER — Encounter (HOSPITAL_COMMUNITY): Payer: Self-pay | Admitting: Urology

## 2023-01-07 ENCOUNTER — Ambulatory Visit (HOSPITAL_COMMUNITY): Payer: Medicare Other | Admitting: Physician Assistant

## 2023-01-07 DIAGNOSIS — J9811 Atelectasis: Secondary | ICD-10-CM | POA: Diagnosis not present

## 2023-01-07 DIAGNOSIS — N2889 Other specified disorders of kidney and ureter: Principal | ICD-10-CM | POA: Diagnosis present

## 2023-01-07 DIAGNOSIS — Z7984 Long term (current) use of oral hypoglycemic drugs: Secondary | ICD-10-CM | POA: Diagnosis not present

## 2023-01-07 DIAGNOSIS — Z86711 Personal history of pulmonary embolism: Secondary | ICD-10-CM | POA: Diagnosis not present

## 2023-01-07 DIAGNOSIS — I2510 Atherosclerotic heart disease of native coronary artery without angina pectoris: Secondary | ICD-10-CM | POA: Diagnosis present

## 2023-01-07 DIAGNOSIS — I2699 Other pulmonary embolism without acute cor pulmonale: Secondary | ICD-10-CM | POA: Diagnosis not present

## 2023-01-07 DIAGNOSIS — E119 Type 2 diabetes mellitus without complications: Secondary | ICD-10-CM

## 2023-01-07 DIAGNOSIS — I5082 Biventricular heart failure: Secondary | ICD-10-CM

## 2023-01-07 DIAGNOSIS — I5042 Chronic combined systolic (congestive) and diastolic (congestive) heart failure: Secondary | ICD-10-CM | POA: Diagnosis not present

## 2023-01-07 DIAGNOSIS — I480 Paroxysmal atrial fibrillation: Secondary | ICD-10-CM | POA: Diagnosis present

## 2023-01-07 DIAGNOSIS — I824Z2 Acute embolism and thrombosis of unspecified deep veins of left distal lower extremity: Secondary | ICD-10-CM | POA: Diagnosis not present

## 2023-01-07 DIAGNOSIS — I509 Heart failure, unspecified: Secondary | ICD-10-CM | POA: Diagnosis not present

## 2023-01-07 DIAGNOSIS — E1122 Type 2 diabetes mellitus with diabetic chronic kidney disease: Secondary | ICD-10-CM | POA: Diagnosis present

## 2023-01-07 DIAGNOSIS — E785 Hyperlipidemia, unspecified: Secondary | ICD-10-CM | POA: Diagnosis present

## 2023-01-07 DIAGNOSIS — I110 Hypertensive heart disease with heart failure: Secondary | ICD-10-CM | POA: Diagnosis not present

## 2023-01-07 DIAGNOSIS — Z7982 Long term (current) use of aspirin: Secondary | ICD-10-CM

## 2023-01-07 DIAGNOSIS — Z8249 Family history of ischemic heart disease and other diseases of the circulatory system: Secondary | ICD-10-CM

## 2023-01-07 DIAGNOSIS — I82462 Acute embolism and thrombosis of left calf muscular vein: Secondary | ICD-10-CM | POA: Diagnosis not present

## 2023-01-07 DIAGNOSIS — N1831 Chronic kidney disease, stage 3a: Secondary | ICD-10-CM | POA: Diagnosis present

## 2023-01-07 DIAGNOSIS — C642 Malignant neoplasm of left kidney, except renal pelvis: Principal | ICD-10-CM | POA: Diagnosis present

## 2023-01-07 DIAGNOSIS — D62 Acute posthemorrhagic anemia: Secondary | ICD-10-CM | POA: Diagnosis not present

## 2023-01-07 DIAGNOSIS — Z87891 Personal history of nicotine dependence: Secondary | ICD-10-CM

## 2023-01-07 DIAGNOSIS — Z79899 Other long term (current) drug therapy: Secondary | ICD-10-CM | POA: Diagnosis not present

## 2023-01-07 DIAGNOSIS — I5020 Unspecified systolic (congestive) heart failure: Secondary | ICD-10-CM | POA: Diagnosis not present

## 2023-01-07 DIAGNOSIS — N179 Acute kidney failure, unspecified: Secondary | ICD-10-CM | POA: Diagnosis present

## 2023-01-07 DIAGNOSIS — I5041 Acute combined systolic (congestive) and diastolic (congestive) heart failure: Secondary | ICD-10-CM | POA: Diagnosis present

## 2023-01-07 DIAGNOSIS — I472 Ventricular tachycardia, unspecified: Secondary | ICD-10-CM | POA: Diagnosis not present

## 2023-01-07 DIAGNOSIS — Z7901 Long term (current) use of anticoagulants: Secondary | ICD-10-CM | POA: Diagnosis not present

## 2023-01-07 DIAGNOSIS — I252 Old myocardial infarction: Secondary | ICD-10-CM

## 2023-01-07 DIAGNOSIS — I7121 Aneurysm of the ascending aorta, without rupture: Secondary | ICD-10-CM | POA: Diagnosis present

## 2023-01-07 DIAGNOSIS — Z01818 Encounter for other preprocedural examination: Secondary | ICD-10-CM

## 2023-01-07 DIAGNOSIS — I5043 Acute on chronic combined systolic (congestive) and diastolic (congestive) heart failure: Secondary | ICD-10-CM

## 2023-01-07 DIAGNOSIS — J9601 Acute respiratory failure with hypoxia: Secondary | ICD-10-CM | POA: Diagnosis not present

## 2023-01-07 DIAGNOSIS — I255 Ischemic cardiomyopathy: Secondary | ICD-10-CM | POA: Diagnosis present

## 2023-01-07 DIAGNOSIS — R310 Gross hematuria: Secondary | ICD-10-CM | POA: Diagnosis not present

## 2023-01-07 DIAGNOSIS — I130 Hypertensive heart and chronic kidney disease with heart failure and stage 1 through stage 4 chronic kidney disease, or unspecified chronic kidney disease: Secondary | ICD-10-CM | POA: Diagnosis present

## 2023-01-07 DIAGNOSIS — I236 Thrombosis of atrium, auricular appendage, and ventricle as current complications following acute myocardial infarction: Secondary | ICD-10-CM | POA: Diagnosis not present

## 2023-01-07 DIAGNOSIS — C649 Malignant neoplasm of unspecified kidney, except renal pelvis: Secondary | ICD-10-CM | POA: Diagnosis not present

## 2023-01-07 DIAGNOSIS — I25118 Atherosclerotic heart disease of native coronary artery with other forms of angina pectoris: Secondary | ICD-10-CM | POA: Diagnosis present

## 2023-01-07 DIAGNOSIS — I513 Intracardiac thrombosis, not elsewhere classified: Secondary | ICD-10-CM | POA: Diagnosis present

## 2023-01-07 DIAGNOSIS — Z833 Family history of diabetes mellitus: Secondary | ICD-10-CM

## 2023-01-07 DIAGNOSIS — I2694 Multiple subsegmental pulmonary emboli without acute cor pulmonale: Secondary | ICD-10-CM | POA: Diagnosis not present

## 2023-01-07 DIAGNOSIS — R0609 Other forms of dyspnea: Secondary | ICD-10-CM | POA: Diagnosis not present

## 2023-01-07 DIAGNOSIS — I10 Essential (primary) hypertension: Secondary | ICD-10-CM | POA: Diagnosis present

## 2023-01-07 HISTORY — PX: ROBOTIC ASSITED PARTIAL NEPHRECTOMY: SHX6087

## 2023-01-07 LAB — HEMOGLOBIN AND HEMATOCRIT, BLOOD
HCT: 43.3 % (ref 39.0–52.0)
HCT: 35.4 % — ABNORMAL LOW (ref 39.0–52.0)
Hemoglobin: 13.3 g/dL (ref 13.0–17.0)
Hemoglobin: 11.2 g/dL — ABNORMAL LOW (ref 13.0–17.0)

## 2023-01-07 LAB — TYPE AND SCREEN
ABO/RH(D): O POS
Antibody Screen: NEGATIVE

## 2023-01-07 LAB — BASIC METABOLIC PANEL
Anion gap: 9 (ref 5–15)
BUN: 16 mg/dL (ref 8–23)
CO2: 23 mmol/L (ref 22–32)
Calcium: 8.3 mg/dL — ABNORMAL LOW (ref 8.9–10.3)
Chloride: 104 mmol/L (ref 98–111)
Creatinine, Ser: 1.62 mg/dL — ABNORMAL HIGH (ref 0.61–1.24)
GFR, Estimated: 45 mL/min — ABNORMAL LOW (ref >60)
Glucose, Bld: 145 mg/dL — ABNORMAL HIGH (ref 70–99)
Potassium: 3.9 mmol/L (ref 3.5–5.1)
Sodium: 136 mmol/L (ref 135–145)

## 2023-01-07 LAB — GLUCOSE, CAPILLARY: Glucose-Capillary: 88 mg/dL (ref 70–99)

## 2023-01-07 LAB — ABO/RH: ABO/RH(D): O POS

## 2023-01-07 SURGERY — NEPHRECTOMY, PARTIAL, ROBOT-ASSISTED
Anesthesia: General | Laterality: Left

## 2023-01-07 MED ORDER — SODIUM CHLORIDE 0.9 % IV SOLN: INTRAVENOUS | Status: DC

## 2023-01-07 MED ORDER — AMISULPRIDE (ANTIEMETIC) 5 MG/2ML IV SOLN
INTRAVENOUS | Status: AC
  Filled 2023-01-07: qty 4

## 2023-01-07 MED ORDER — PHENYLEPHRINE HCL (PRESSORS) 10 MG/ML IV SOLN
INTRAVENOUS | Status: AC
  Filled 2023-01-07: qty 1

## 2023-01-07 MED ORDER — CHLORHEXIDINE GLUCONATE 0.12 % MT SOLN
15.0000 mL | Freq: Once | OROMUCOSAL | Status: AC
  Administered 2023-01-07: 15 mL via OROMUCOSAL

## 2023-01-07 MED ORDER — ORAL CARE MOUTH RINSE: 15.0000 mL | Freq: Once | OROMUCOSAL | Status: AC

## 2023-01-07 MED ORDER — LACTATED RINGERS IV SOLN: INTRAVENOUS | Status: DC | PRN

## 2023-01-07 MED ORDER — BUPIVACAINE HCL (PF) 0.5 % IJ SOLN
INTRAMUSCULAR | Status: DC | PRN
  Administered 2023-01-07: 13 mL

## 2023-01-07 MED ORDER — LACTATED RINGERS IR SOLN
Status: DC | PRN
  Administered 2023-01-07: 1000 mL

## 2023-01-07 MED ORDER — ACETAMINOPHEN 500 MG PO TABS
1000.0000 mg | ORAL_TABLET | Freq: Once | ORAL | Status: AC
  Administered 2023-01-07: 1000 mg via ORAL
  Filled 2023-01-07: qty 2

## 2023-01-07 MED ORDER — METOPROLOL TARTRATE 5 MG/5ML IV SOLN
INTRAVENOUS | Status: DC | PRN
  Administered 2023-01-07: 1 mg via INTRAVENOUS

## 2023-01-07 MED ORDER — ACETAMINOPHEN 500 MG PO TABS
1000.0000 mg | ORAL_TABLET | Freq: Four times a day (QID) | ORAL | Status: DC
Start: 2023-01-07 — End: 2023-01-07

## 2023-01-07 MED ORDER — METOPROLOL SUCCINATE ER 50 MG PO TB24
50.0000 mg | ORAL_TABLET | Freq: Every day | ORAL | Status: DC
  Administered 2023-01-08 – 2023-01-17 (×9): 50 mg via ORAL
  Filled 2023-01-07 (×11): qty 1

## 2023-01-07 MED ORDER — ACETAMINOPHEN 10 MG/ML IV SOLN
1000.0000 mg | Freq: Four times a day (QID) | INTRAVENOUS | Status: AC
  Administered 2023-01-07 – 2023-01-08 (×4): 1000 mg via INTRAVENOUS
  Filled 2023-01-07 (×4): qty 100

## 2023-01-07 MED ORDER — CEFAZOLIN SODIUM-DEXTROSE 1-4 GM/50ML-% IV SOLN
1.0000 g | Freq: Three times a day (TID) | INTRAVENOUS | Status: AC
  Administered 2023-01-07 – 2023-01-08 (×2): 1 g via INTRAVENOUS
  Filled 2023-01-07 (×3): qty 50

## 2023-01-07 MED ORDER — KETAMINE HCL 10 MG/ML IJ SOLN
INTRAMUSCULAR | Status: DC | PRN
  Administered 2023-01-07: 10 mg via INTRAVENOUS
  Administered 2023-01-07: 20 mg via INTRAVENOUS
  Administered 2023-01-07: 10 mg via INTRAVENOUS

## 2023-01-07 MED ORDER — TRAMADOL HCL 50 MG PO TABS: 50.0000 mg | ORAL_TABLET | Freq: Four times a day (QID) | ORAL | 0 refills | Status: DC | PRN

## 2023-01-07 MED ORDER — ONDANSETRON HCL 4 MG/2ML IJ SOLN
INTRAMUSCULAR | Status: DC | PRN
  Administered 2023-01-07: 4 mg via INTRAVENOUS

## 2023-01-07 MED ORDER — CEFAZOLIN SODIUM-DEXTROSE 2-4 GM/100ML-% IV SOLN
2.0000 g | INTRAVENOUS | Status: AC
  Administered 2023-01-07: 2 g via INTRAVENOUS
  Filled 2023-01-07: qty 100

## 2023-01-07 MED ORDER — BUPIVACAINE LIPOSOME 1.3 % IJ SUSP
INTRAMUSCULAR | Status: AC
  Filled 2023-01-07: qty 10

## 2023-01-07 MED ORDER — BUPIVACAINE HCL (PF) 0.5 % IJ SOLN
INTRAMUSCULAR | Status: AC
  Filled 2023-01-07: qty 30

## 2023-01-07 MED ORDER — DOCUSATE SODIUM 100 MG PO CAPS
100.0000 mg | ORAL_CAPSULE | Freq: Two times a day (BID) | ORAL | Status: DC
  Administered 2023-01-07 – 2023-01-18 (×16): 100 mg via ORAL
  Filled 2023-01-07 (×22): qty 1

## 2023-01-07 MED ORDER — LACTATED RINGERS IV SOLN: INTRAVENOUS | Status: DC

## 2023-01-07 MED ORDER — POLYETHYLENE GLYCOL 3350 17 GM/SCOOP PO POWD: 1.0000 | Freq: Once | ORAL | Status: DC

## 2023-01-07 MED ORDER — ETOMIDATE 2 MG/ML IV SOLN
INTRAVENOUS | Status: DC | PRN
  Administered 2023-01-07: 20 mg via INTRAVENOUS

## 2023-01-07 MED ORDER — TRAMADOL HCL 50 MG PO TABS
50.0000 mg | ORAL_TABLET | Freq: Four times a day (QID) | ORAL | Status: DC | PRN
  Administered 2023-01-07 – 2023-01-09 (×3): 50 mg via ORAL
  Administered 2023-01-10 (×2): 100 mg via ORAL
  Administered 2023-01-13 – 2023-01-15 (×3): 50 mg via ORAL
  Filled 2023-01-07: qty 1
  Filled 2023-01-07 (×2): qty 2
  Filled 2023-01-07 (×5): qty 1

## 2023-01-07 MED ORDER — MIDAZOLAM HCL 5 MG/5ML IJ SOLN
INTRAMUSCULAR | Status: DC | PRN
  Administered 2023-01-07: 2 mg via INTRAVENOUS

## 2023-01-07 MED ORDER — KETAMINE HCL 50 MG/5ML IJ SOSY
PREFILLED_SYRINGE | INTRAMUSCULAR | Status: AC
  Filled 2023-01-07: qty 5

## 2023-01-07 MED ORDER — ROCURONIUM BROMIDE 100 MG/10ML IV SOLN
INTRAVENOUS | Status: DC | PRN
  Administered 2023-01-07: 10 mg via INTRAVENOUS
  Administered 2023-01-07: 30 mg via INTRAVENOUS
  Administered 2023-01-07: 20 mg via INTRAVENOUS
  Administered 2023-01-07: 70 mg via INTRAVENOUS

## 2023-01-07 MED ORDER — ONDANSETRON HCL 4 MG/2ML IJ SOLN: 4.0000 mg | INTRAMUSCULAR | Status: DC | PRN

## 2023-01-07 MED ORDER — MORPHINE SULFATE (PF) 2 MG/ML IV SOLN: 2.0000 mg | INTRAVENOUS | Status: DC | PRN

## 2023-01-07 MED ORDER — DOCUSATE SODIUM 100 MG PO CAPS: 100.0000 mg | ORAL_CAPSULE | Freq: Two times a day (BID) | ORAL | Status: DC

## 2023-01-07 MED ORDER — DIPHENHYDRAMINE HCL 50 MG/ML IJ SOLN: 12.5000 mg | Freq: Four times a day (QID) | INTRAMUSCULAR | Status: DC | PRN

## 2023-01-07 MED ORDER — FENTANYL CITRATE (PF) 100 MCG/2ML IJ SOLN
INTRAMUSCULAR | Status: DC | PRN
  Administered 2023-01-07 (×3): 50 ug via INTRAVENOUS
  Administered 2023-01-07: 100 ug via INTRAVENOUS

## 2023-01-07 MED ORDER — MIDAZOLAM HCL 2 MG/2ML IJ SOLN
INTRAMUSCULAR | Status: AC
  Filled 2023-01-07: qty 2

## 2023-01-07 MED ORDER — LIDOCAINE HCL (CARDIAC) PF 100 MG/5ML IV SOSY
PREFILLED_SYRINGE | INTRAVENOUS | Status: DC | PRN
  Administered 2023-01-07: 100 mg via INTRAVENOUS

## 2023-01-07 MED ORDER — DEXAMETHASONE SODIUM PHOSPHATE 4 MG/ML IJ SOLN
INTRAMUSCULAR | Status: DC | PRN
  Administered 2023-01-07: 8 mg via INTRAVENOUS

## 2023-01-07 MED ORDER — HYDROMORPHONE HCL 1 MG/ML IJ SOLN
0.2500 mg | INTRAMUSCULAR | Status: DC | PRN
  Administered 2023-01-07: 0.5 mg via INTRAVENOUS
  Administered 2023-01-07: 0.25 mg via INTRAVENOUS

## 2023-01-07 MED ORDER — AMISULPRIDE (ANTIEMETIC) 5 MG/2ML IV SOLN: 10.0000 mg | Freq: Once | INTRAVENOUS | Status: DC | PRN

## 2023-01-07 MED ORDER — SUGAMMADEX SODIUM 200 MG/2ML IV SOLN
INTRAVENOUS | Status: DC | PRN
  Administered 2023-01-07: 400 mg via INTRAVENOUS

## 2023-01-07 MED ORDER — STERILE WATER FOR IRRIGATION IR SOLN
Status: DC | PRN
  Administered 2023-01-07: 1000 mL

## 2023-01-07 MED ORDER — HEMOSTATIC AGENTS (NO CHARGE) OPTIME
TOPICAL | Status: DC | PRN
  Administered 2023-01-07: 1 via TOPICAL

## 2023-01-07 MED ORDER — PROPOFOL 10 MG/ML IV BOLUS
INTRAVENOUS | Status: DC | PRN
  Administered 2023-01-07 (×2): 50 mg via INTRAVENOUS

## 2023-01-07 MED ORDER — HYDROMORPHONE HCL 1 MG/ML IJ SOLN
INTRAMUSCULAR | Status: AC
  Administered 2023-01-07: 0.25 mg via INTRAVENOUS
  Filled 2023-01-07: qty 1

## 2023-01-07 MED ORDER — FENTANYL CITRATE (PF) 250 MCG/5ML IJ SOLN
INTRAMUSCULAR | Status: AC
  Filled 2023-01-07: qty 5

## 2023-01-07 MED ORDER — PROPOFOL 10 MG/ML IV BOLUS
INTRAVENOUS | Status: AC
  Filled 2023-01-07: qty 20

## 2023-01-07 MED ORDER — BUPIVACAINE LIPOSOME 1.3 % IJ SUSP
INTRAMUSCULAR | Status: DC | PRN
  Administered 2023-01-07: 20 mL

## 2023-01-07 MED ORDER — ETOMIDATE 2 MG/ML IV SOLN
INTRAVENOUS | Status: AC
  Filled 2023-01-07: qty 10

## 2023-01-07 MED ORDER — DIPHENHYDRAMINE HCL 12.5 MG/5ML PO ELIX: 12.5000 mg | ORAL_SOLUTION | Freq: Four times a day (QID) | ORAL | Status: DC | PRN

## 2023-01-07 SURGICAL SUPPLY — 77 items
ADH SKN CLS APL DERMABOND .7 (GAUZE/BANDAGES/DRESSINGS) ×1
AGENT HMST KT MTR STRL THRMB (HEMOSTASIS) ×1
APL ESCP 34 STRL LF DISP (HEMOSTASIS) ×1
APL PRP STRL LF DISP 70% ISPRP (MISCELLANEOUS) ×1
APL SRG 38 LTWT LNG FL B (MISCELLANEOUS)
APPLICATOR ARISTA FLEXITIP XL (MISCELLANEOUS) IMPLANT
APPLICATOR SURGIFLO ENDO (HEMOSTASIS) IMPLANT
BAG COUNTER SPONGE SURGICOUNT (BAG) IMPLANT
BAG SPNG CNTER NS LX DISP (BAG)
CHLORAPREP W/TINT 26 (MISCELLANEOUS) ×1 IMPLANT
CLIP LIGATING HEM O LOK PURPLE (MISCELLANEOUS) ×1 IMPLANT
CLIP LIGATING HEMO LOK XL GOLD (MISCELLANEOUS) IMPLANT
CLIP LIGATING HEMO O LOK GREEN (MISCELLANEOUS) ×2 IMPLANT
COVER SURGICAL LIGHT HANDLE (MISCELLANEOUS) ×1 IMPLANT
COVER TIP SHEARS 8 DVNC (MISCELLANEOUS) ×1 IMPLANT
CUTTER ECHEON FLEX ENDO 45 340 (ENDOMECHANICALS) IMPLANT
DERMABOND ADVANCED .7 DNX12 (GAUZE/BANDAGES/DRESSINGS) ×1 IMPLANT
DRAIN CHANNEL 15F RND FF 3/16 (WOUND CARE) ×1 IMPLANT
DRAPE ARM DVNC X/XI (DISPOSABLE) ×4 IMPLANT
DRAPE COLUMN DVNC XI (DISPOSABLE) ×1 IMPLANT
DRAPE INCISE IOBAN 66X45 STRL (DRAPES) ×1 IMPLANT
DRAPE SHEET LG 3/4 BI-LAMINATE (DRAPES) ×1 IMPLANT
DRIVER NDL LRG 8 DVNC XI (INSTRUMENTS) ×3 IMPLANT
DRIVER NDLE LRG 8 DVNC XI (INSTRUMENTS) ×3 IMPLANT
DRSG TEGADERM 4X4.75 (GAUZE/BANDAGES/DRESSINGS) IMPLANT
ELECT PENCIL ROCKER SW 15FT (MISCELLANEOUS) ×1 IMPLANT
ELECT REM PT RETURN 15FT ADLT (MISCELLANEOUS) ×1 IMPLANT
EVACUATOR SILICONE 100CC (DRAIN) ×1 IMPLANT
FORCEPS BPLR LNG DVNC XI (INSTRUMENTS) ×1 IMPLANT
FORCEPS PROGRASP DVNC XI (FORCEP) ×1 IMPLANT
GAUZE SPONGE 2X2 8PLY STRL LF (GAUZE/BANDAGES/DRESSINGS) IMPLANT
GLOVE BIO SURGEON STRL SZ 6.5 (GLOVE) ×1 IMPLANT
GLOVE SURG LX STRL 7.5 STRW (GLOVE) ×2 IMPLANT
GOWN SRG XL LVL 4 BRTHBL STRL (GOWNS) ×1 IMPLANT
GOWN STRL NON-REIN XL LVL4 (GOWNS) ×1
GOWN STRL REUS W/ TWL XL LVL3 (GOWN DISPOSABLE) ×2 IMPLANT
GOWN STRL REUS W/TWL XL LVL3 (GOWN DISPOSABLE) ×2
HEMOSTAT ARISTA ABSORB 3G PWDR (HEMOSTASIS) IMPLANT
HOLDER FOLEY CATH W/STRAP (MISCELLANEOUS) IMPLANT
IRRIG SUCT STRYKERFLOW 2 WTIP (MISCELLANEOUS) ×1
IRRIGATION SUCT STRKRFLW 2 WTP (MISCELLANEOUS) ×1 IMPLANT
KIT BASIN OR (CUSTOM PROCEDURE TRAY) ×1 IMPLANT
KIT TURNOVER KIT A (KITS) IMPLANT
LOOP VESSEL MAXI BLUE (MISCELLANEOUS) ×1 IMPLANT
MARKER SKIN DUAL TIP RULER LAB (MISCELLANEOUS) ×1 IMPLANT
NDL INSUFFLATION 14GA 120MM (NEEDLE) IMPLANT
NEEDLE INSUFFLATION 14GA 120MM (NEEDLE) IMPLANT
NS IRRIG 1000ML POUR BTL (IV SOLUTION) ×1 IMPLANT
PAD POSITIONING PINK XL (MISCELLANEOUS) ×1 IMPLANT
PROTECTOR NERVE ULNAR (MISCELLANEOUS) ×2 IMPLANT
RELOAD STAPLE 45 2.6 WHT THIN (STAPLE) IMPLANT
SCISSORS MNPLR CVD DVNC XI (INSTRUMENTS) ×1 IMPLANT
SEAL UNIV 5-12 XI (MISCELLANEOUS) ×3 IMPLANT
SET TUBE SMOKE EVAC HIGH FLOW (TUBING) ×1 IMPLANT
SOL ELECTROSURG ANTI STICK (MISCELLANEOUS) ×1
SOLUTION ELECTROSURG ANTI STCK (MISCELLANEOUS) ×1 IMPLANT
SPIKE FLUID TRANSFER (MISCELLANEOUS) ×1 IMPLANT
STAPLE RELOAD 45 WHT (STAPLE) IMPLANT
STAPLE RELOAD 45MM WHITE (STAPLE)
SURGIFLO W/THROMBIN 8M KIT (HEMOSTASIS) IMPLANT
SUT ETHILON 3 0 PS 1 (SUTURE) ×1 IMPLANT
SUT MNCRL AB 4-0 PS2 18 (SUTURE) ×2 IMPLANT
SUT V-LOC BARB 180 2/0GR6 GS22 (SUTURE) ×2
SUT VIC AB 0 CT1 27 (SUTURE) ×1
SUT VIC AB 0 CT1 27XBRD ANTBC (SUTURE) ×1 IMPLANT
SUT VICRYL 0 UR6 27IN ABS (SUTURE) IMPLANT
SUT VLOC BARB 180 ABS3/0GR12 (SUTURE) ×3
SUTURE V-LC BRB 180 2/0GR6GS22 (SUTURE) ×1 IMPLANT
SUTURE VLOC BRB 180 ABS3/0GR12 (SUTURE) ×1 IMPLANT
SYS BAG RETRIEVAL 10MM (BASKET) ×1
SYSTEM BAG RETRIEVAL 10MM (BASKET) ×1 IMPLANT
TOWEL OR 17X26 10 PK STRL BLUE (TOWEL DISPOSABLE) ×1 IMPLANT
TRAY FOLEY MTR SLVR 16FR STAT (SET/KITS/TRAYS/PACK) ×1 IMPLANT
TRAY LAPAROSCOPIC (CUSTOM PROCEDURE TRAY) ×1 IMPLANT
TROCAR ADV FIXATION 12X100MM (TROCAR) ×1 IMPLANT
TROCAR Z-THREAD OPTICAL 5X100M (TROCAR) IMPLANT
WATER STERILE IRR 1000ML POUR (IV SOLUTION) ×1 IMPLANT

## 2023-01-07 NOTE — Anesthesia Procedure Notes (Signed)
Procedure Name: Intubation Date/Time: 01/07/2023 8:34 AM  Performed by: Deri Fuelling, CRNAPre-anesthesia Checklist: Patient identified, Emergency Drugs available, Suction available and Patient being monitored Patient Re-evaluated:Patient Re-evaluated prior to induction Oxygen Delivery Method: Circle system utilized Preoxygenation: Pre-oxygenation with 100% oxygen Induction Type: IV induction Ventilation: Mask ventilation without difficulty Laryngoscope Size: Mac and 4 Grade View: Grade I Tube type: Oral Number of attempts: 1 Airway Equipment and Method: Stylet and Oral airway Placement Confirmation: ETT inserted through vocal cords under direct vision, positive ETCO2 and breath sounds checked- equal and bilateral Secured at: 22 cm Tube secured with: Tape Dental Injury: Teeth and Oropharynx as per pre-operative assessment

## 2023-01-07 NOTE — H&P (Signed)
72 year old African-American male presents today for discussion of bilateral renal masses. The patient was admitted to the hospital on 07/24/2021. He presented with fever and shortness of breath. He subsequently developed chest pain. 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 He also was noted to have severe coronary artery disease with 100% occlusion of the LAD and proximal right RCA. As part of the work-up an echocardiogram was performed which demonstrated a large left ventricular apical thrombus. His ejection fraction was 25%.   In addition to the above the patient was also diagnosed with bilateral mildly enhancing renal masses.   The patient prior to this had not been to the doctor in quite some time, and had been avoiding healthcare for a while.   02/05/2022: Today the patient is seen for 20-month interval follow-up. He had an MRI prior to his appointment today. He denies any voiding symptoms, gross hematuria, or flank pain.   The patient's MRI demonstrated a 2 cm mildly enhancing suspicious mass in the anterior interpolar right kidney. There is a 3.7 x 3.8 cm left interpolar lesion with very mild postcontrast enhancement. Finally, there is a 5.7 cm lesion in the right anterior upper pole which has no contrast-enhancement.   08/20/2022: The patient presents today for further discussion following his recent MRI. He is otherwise been doing well. He continues on Xarelto for his atrial thrombus. He was last seen by cardiology in August. He is doing well with no real significant complaints.   The patient's MRI today demonstrated a 2.2 cm mildly enhancing mass in the anterior pole of the right kidney which is only slightly increased from 6 months prior. However, the lesion in the patient's left posterior kidney has grown over a centimeter over the past 6 months. It now measures 5.3X 4.8 cm which is increased from 4.4 x 3.8 cm.   Interval:  The patient is here today for 59-month interval follow-up. He had a repeat MRI performed prior to his appointment today. The patient states that he is starting to feel better, has much better exercise tolerance. He has been more diligent with his health.   12/24/2022: Troy May is a 72 year old man who today for preoperative appointment prior to undergoing a left partial nephrectomy with Dr. Marlou Porch on 01/07/2023. He was cleared by his cardiologist. Lab work from March is normal. He denies chest pain, shortness of breath. He denies fevers and chills. He denies any voiding complaints.     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 Has not smoked since 06/30/2021.   Tobacco Use Assessment Completed: Used Tobacco in last 30 days? Social Drinker.  Does not drink caffeine. Patient's occupation is/was Retired.    REVIEW OF SYSTEMS:    GU Review Male:   Patient denies frequent urination, hard to postpone  urination, burning/ pain with urination, get up at night to urinate, leakage of urine, stream starts and stops, trouble starting your stream, have to strain to urinate , erection problems, and penile pain.  Gastrointestinal (Upper):   Patient denies nausea, vomiting, and indigestion/ heartburn.  Gastrointestinal (Lower):   Patient denies diarrhea and constipation.  Constitutional:   Patient denies fever, night sweats, weight loss, and fatigue.  Skin:   Patient denies skin rash/ lesion and itching.  Eyes:   Patient denies blurred vision and double vision.  Ears/ Nose/ Throat:   Patient denies sore throat and sinus problems.  Hematologic/Lymphatic:   Patient denies swollen glands and easy bruising.  Cardiovascular:   Patient denies leg swelling and chest pains.  Respiratory:   Patient denies cough and shortness of breath.  Endocrine:   Patient denies excessive thirst.  Musculoskeletal:   Patient denies back pain and joint pain.  Neurological:   Patient denies headaches and dizziness.  Psychologic:   Patient denies depression and anxiety.   VITAL SIGNS:      12/24/2022 08:45 AM  Weight 200 lb / 90.72 kg  Height 70 in / 177.8 cm  BP 122/70 mmHg  Pulse 61 /min  BMI 28.7 kg/m   MULTI-SYSTEM PHYSICAL EXAMINATION:    Constitutional: Well-nourished. No physical deformities. Normally developed. Good grooming.  Respiratory: No labored breathing, no use of accessory muscles.   Cardiovascular: Normal temperature, normal extremity pulses, no swelling, no varicosities.  Skin: No paleness, no jaundice, no cyanosis. No lesion, no ulcer, no rash.  Neurologic / Psychiatric: Oriented to time, oriented to place, oriented to person. No depression, no anxiety, no agitation.  Gastrointestinal: No mass, no tenderness, no rigidity, non obese abdomen.     Complexity of Data:  Source Of History:  Patient  Records Review:   Previous Doctor Records, Previous Patient Records  Urine Test Review:   Urinalysis    11/12/22  PSA  Total PSA 0.41 ng/mL    12/24/22  Urinalysis  Urine Appearance Clear   Urine Color Yellow   Urine Glucose 3+ mg/dL  Urine Bilirubin Neg mg/dL  Urine Ketones Neg mg/dL  Urine Specific Gravity 1.025   Urine Blood Neg ery/uL  Urine pH <=5.0   Urine Protein Neg mg/dL  Urine Urobilinogen 0.2 mg/dL  Urine Nitrites Neg   Urine Leukocyte Esterase Neg leu/uL   PROCEDURES:          Urinalysis Dipstick Dipstick Cont'd  Color: Yellow Bilirubin: Neg mg/dL  Appearance: Clear Ketones: Neg mg/dL  Specific Gravity: 1.610 Blood: Neg ery/uL  pH: <=5.0 Protein: Neg mg/dL  Glucose: 3+ mg/dL Urobilinogen: 0.2 mg/dL    Nitrites: Neg    Leukocyte Esterase: Neg leu/uL    ASSESSMENT:      ICD-10 Details  1 GU:   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 His urinalysis is clear but I will send for precautionary culture. He will keep scheduled surgical appointment on 01/07/2023

## 2023-01-07 NOTE — Discharge Instructions (Addendum)
Driving:  It is against the law to drive when taking narcotic pain medications.  You should wait at least 8 hours after taking your last pain pill before driving.  Further, you should not drive if you are to sore to react quickly or if you have something impeding your ability to drive.   Activity:  You are encouraged to ambulate frequently (about every hour during waking hours) to help prevent blood clots from forming in your legs or lungs.  However, you should not engage in any heavy lifting (> 10-15 lbs), strenuous activity, or straining.  Diet: You should advance your diet as instructed by your physician.  It will be normal to have some bloating, nausea, and abdominal discomfort intermittently.  Prescriptions:  You will be provided a prescription for pain medication to take as needed.  If your pain is not severe enough to require the prescription pain medication, you may take extra strength Tylenol instead which will have less side effects.  You should also take a prescribed stool softener to avoid straining with bowel movements as the prescription pain medication may constipate you.  Incisions: You may remove your dressing bandages 48 hours after surgery if not removed in the hospital.  You will either have some small staples or special tissue glue at each of the incision sites. Once the bandages are removed (if present), the incisions may stay open to air.  You may start showering (but not soaking or bathing in water) the 2nd day after surgery and the incisions simply need to be patted dry after the shower.  No additional care is needed.  What to call us about: You should call the office [phone]) if you develop fever > 101 or develop persistent vomiting. Activity:  You are encouraged to ambulate frequently (about every hour during waking hours) to help prevent blood clots from forming in your legs or lungs.  However, you should not engage in any heavy lifting (> 10-15 lbs), strenuous activity,  or straining.      ________________________________________________________  Information on my medicine - XARELTO (rivaroxaban)  This medication education was reviewed with me or my healthcare representative as part of my discharge preparation.    WHY WAS XARELTO PRESCRIBED FOR YOU? 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  What do you need to know about Xarelto? The starting dose is one 15 mg tablet taken TWICE daily with food for the FIRST 21 DAYS then on 02/06/23    the dose is changed to one 20 mg tablet taken ONCE A DAY with your evening meal.  DO NOT stop taking Xarelto without talking to the health care provider who prescribed the medication.  Refill your prescription for 20 mg tablets before you run out.  After discharge, you should have regular check-up appointments with your healthcare provider that is prescribing your Xarelto.  In the future your dose may need to be changed if your kidney function changes by a significant amount.  What do you do if you miss a dose? If you are taking Xarelto TWICE DAILY and you miss a dose, take it as soon as you remember. You may take two 15 mg tablets (total 30 mg) at the same time then resume your regularly scheduled 15 mg twice daily the next day.  If you are taking Xarelto ONCE DAILY and  you miss a dose, take it as soon as you remember on the same day then continue your regularly scheduled once daily regimen the next day. Do not take two doses of Xarelto at the same time.   Important Safety Information Xarelto is a blood thinner medicine that can cause bleeding. You should call your healthcare provider right away if you experience any of the following: Bleeding from an injury or your nose that does not stop. Unusual colored urine (red or dark brown) or unusual colored  stools (red or black). Unusual bruising for unknown reasons. A serious fall or if you hit your head (even if there is no bleeding).  Some medicines may interact with Xarelto and might increase your risk of bleeding while on Xarelto. To help avoid this, consult your healthcare provider or pharmacist prior to using any new prescription or non-prescription medications, including herbals, vitamins, non-steroidal anti-inflammatory drugs (NSAIDs) and supplements.  This website has more information on Xarelto: VisitDestination.com.br.

## 2023-01-07 NOTE — Op Note (Signed)
Preoperative diagnosis:  Left renal mass   Postoperative diagnosis:  same   Procedure: Robotic assisted laparoscopic left partial nephrectomy  Surgeon: Crist Fat, MD 1st assistant: Michaele Offer  Anesthesia: General  Complications: None  Intraoperative findings:  #1. Large, 6cm, mid/lower pole anteriolateral tumor #2. Warm ischemia time  EBL: 100cc  Specimens: left renal mass   Indication: Troy May is a 72 y.o. patient with left renal mass.  After reviewing the management options for treatment, he elected to proceed with the above surgical procedure(s). We have discussed the potential benefits and risks of the procedure, side effects of the proposed treatment, the likelihood of the patient achieving the goals of the procedure, and any potential problems that might occur during the procedure or recuperation. Informed consent has been obtained.  Description of procedure:  The patient was taken to the operating room and a general anesthetic was administered. The patient was given preoperative antibiotics, placed in the right modified flank position with care to pad all potential pressure points, and prepped and draped in the usual sterile fashion. Next a preoperative timeout was performed.  A site was selected on the left side of the umbilicus for placement of the camera port. This was placed using a standard modified Hassan technique with entry into the peritoneum with a  8 mm tocar. We entered the peritoneum without incident and established pneumoperitoneum.  The camera was then used to inspect the abdomen and there was no evidence of any intra-abdominal injuries or other abnormalities. The remaining abdominal ports were then placed. 8 mm robotic ports were placed in the left upper quadrant, left lower quadrant, and left lateral abdominal wall, making a soft J. A 12 mm port was placed in the upper midline for laparoscopic assistance. All ports were placed under direct  vision without difficulty. The surgical cart was then docked.   Utilizing the cautery scissors, the white line of Toldt was incised allowing the colon to be mobilized medially and the plane between the mesocolon and the anterior layer of Gerota's fascia to be developed and the kidney to be exposed.  The ureter and gonadal vein were identified inferiorly and the ureter was lifted anteriorly off the psoas muscle.  Dissection proceeded superiorly along the gonadal vein until the renal vein was identified.  The renal hilum was then carefully isolated with a combination of blunt and sharp dissection allowing the renal arterial and venous structures to be separated and isolated in preparation for renal hilar vessel clamping.  Attention turned to the kidney and the perinephric fat surrounding the renal mass was removed and the kidney was mobilized sufficiently for exposure and resection of the renal mass.    Once the renal mass was properly isolated, preparations were made for resection of the tumor.    The renal artery was then clamped with a bulldog clamp.  The tumor was then excised with cold scissor dissection along with an adequate visible gross margin of normal renal parenchyma. The tumor appeared to be excised without any gross violation of the tumor. The renal collecting system was not entered during removal of the tumor.  Two running 3-0 V-lock sutures was then brought through the capsule of the kidney and run along the base of the renal defect to provide hemostasis and close any entry into the renal collecting system if present. Weck clips were used to secure this suture outside the renal capsule at the proximal and distal ends. The bulldog clamps were then removed from  the renal hilar vessel. Two running 2-0 V lock sutures was then used to close the capsule of the kidney using a sliding clip technique which resulted in excellent hemostasis. An additional hemostatic agent (Surgiflo) was then placed into the  renal defect.     Total warm renal ischemia time was 33 minutes. The renal tumor resection site was examined. Hemostasis appeared adequate.   The kidney was placed back into its normal anatomic position and covered with perinephric fat as needed.  A # 15 Blake drain was then brought through the lateral lower port site and positioned in the perinephric space.  It was secured to the skin with a nylon suture. The surgical robotic cart was undocked.  The renal tumor specimen was removed intact within an endopouch retrieval bag via the camera port sites.  The camera port site and the other 12 mm port site were then closed at the fascial level with 0-vicryl suture.  All other laparoscopic/robotic ports were removed under direct vision and the pneumoperitoneum let down with inspection of the operative field performed and hemostasis again confirmed. All incision sites were then injected with local anesthetic and reapproximated at the skin level with 4-0 monocryl subcuticular closures. Dermabond was applied to the skin.  The patient tolerated the procedure well and without complications.  The patient was able to be extubated and transferred to the recovery unit in satisfactory condition.  Crist Fat, M.D.

## 2023-01-07 NOTE — Treatment Plan (Signed)
Paged re: peri-JP leakage. Assessed patient. Mild saturation of JP drain dressing, scant oozing around JP, serosanguinous. No pain. Hgb 11.2 from 13.3. Vitals re-assuring.   Reinforce dressing PRN throughout the night. Will follow vitals and AM labs.

## 2023-01-07 NOTE — Anesthesia Postprocedure Evaluation (Signed)
Anesthesia Post Note  Patient: Troy May  Procedure(s) Performed: XI ROBOTIC ASSITED LEFT PARTIAL NEPHRECTOMY (Left)     Patient location during evaluation: PACU Anesthesia Type: General Level of consciousness: awake and alert Pain management: pain level controlled Vital Signs Assessment: post-procedure vital signs reviewed and stable Respiratory status: spontaneous breathing, nonlabored ventilation and respiratory function stable Cardiovascular status: blood pressure returned to baseline Postop Assessment: no apparent nausea or vomiting Anesthetic complications: no   No notable events documented.  Last Vitals:  Vitals:   01/07/23 1230 01/07/23 1245  BP: 137/85 125/84  Pulse: 70 71  Resp: 12 20  Temp:    SpO2: 92% 91%    Last Pain:  Vitals:   01/07/23 1245  TempSrc:   PainSc: Asleep                 Shanda Howells

## 2023-01-07 NOTE — Anesthesia Procedure Notes (Signed)
Arterial Line Insertion Start/End5/05/2023 7:20 AM, 01/07/2023 7:25 AM Performed by: [place], Stephanie C, CRNA, CRNA  Preanesthetic checklist: patient identified, IV checked, site marked, risks and benefits discussed, surgical consent, monitors and equipment checked, pre-op evaluation, timeout performed and anesthesia consent Lidocaine 1% used for infiltration and patient sedated Left, radial was placed Hand hygiene performed , maximum sterile barriers used  and Seldinger technique used Allen's test indicative of satisfactory collateral circulation Attempts: 1 Procedure performed without using ultrasound guided technique. Following insertion, Biopatch and dressing applied. Post procedure assessment: normal  Patient tolerated the procedure well with no immediate complications.

## 2023-01-07 NOTE — Interval H&P Note (Signed)
History and Physical Interval Note:  01/07/2023 7:03 AM  Troy May  has presented today for surgery, with the diagnosis of LEFT RENAL MASS.  The various methods of treatment have been discussed with the patient and family. After consideration of risks, benefits and other options for treatment, the patient has consented to  Procedure(s) with comments: XI ROBOTIC ASSITED LEFT PARTIAL NEPHRECTOMY (Left) - 210 MINUTES as a surgical intervention.  The patient's history has been reviewed, patient examined, no change in status, stable for surgery.  I have reviewed the patient's chart and labs.  Questions were answered to the patient's satisfaction.     Crist Fat

## 2023-01-07 NOTE — Transfer of Care (Signed)
Immediate Anesthesia Transfer of Care Note  Patient: Troy May  Procedure(s) Performed: XI ROBOTIC ASSITED LEFT PARTIAL NEPHRECTOMY (Left)  Patient Location: PACU  Anesthesia Type:General  Level of Consciousness: awake, alert , and oriented  Airway & Oxygen Therapy: Patient Spontanous Breathing and Patient connected to face mask oxygen  Post-op Assessment: Report given to RN and Post -op Vital signs reviewed and stable  Post vital signs: Reviewed and stable  Last Vitals:  Vitals Value Taken Time  BP 189/108 01/07/23 1153  Temp    Pulse 89 01/07/23 1154  Resp 25 01/07/23 1154  SpO2 100 % 01/07/23 1154  Vitals shown include unvalidated device data.  Last Pain:  Vitals:   01/07/23 0536  TempSrc: Oral         Complications: No notable events documented.

## 2023-01-08 ENCOUNTER — Encounter (HOSPITAL_COMMUNITY): Payer: Self-pay | Admitting: Urology

## 2023-01-08 LAB — BASIC METABOLIC PANEL
Anion gap: 9 (ref 5–15)
BUN: 21 mg/dL (ref 8–23)
CO2: 22 mmol/L (ref 22–32)
Calcium: 7.7 mg/dL — ABNORMAL LOW (ref 8.9–10.3)
Chloride: 105 mmol/L (ref 98–111)
Creatinine, Ser: 2.37 mg/dL — ABNORMAL HIGH (ref 0.61–1.24)
GFR, Estimated: 29 mL/min — ABNORMAL LOW (ref >60)
Glucose, Bld: 118 mg/dL — ABNORMAL HIGH (ref 70–99)
Potassium: 4.6 mmol/L (ref 3.5–5.1)
Sodium: 136 mmol/L (ref 135–145)

## 2023-01-08 LAB — HEMOGLOBIN AND HEMATOCRIT, BLOOD
HCT: 33.3 % — ABNORMAL LOW (ref 39.0–52.0)
HCT: 30.7 % — ABNORMAL LOW (ref 39.0–52.0)
HCT: 32.4 % — ABNORMAL LOW (ref 39.0–52.0)
Hemoglobin: 10.5 g/dL — ABNORMAL LOW (ref 13.0–17.0)
Hemoglobin: 9.7 g/dL — ABNORMAL LOW (ref 13.0–17.0)
Hemoglobin: 10.3 g/dL — ABNORMAL LOW (ref 13.0–17.0)

## 2023-01-08 LAB — CREATININE, FLUID (PLEURAL, PERITONEAL, JP DRAINAGE): Creat, Fluid: 2.2 mg/dL

## 2023-01-08 MED ORDER — SODIUM CHLORIDE 0.9 % IV BOLUS
500.0000 mL | Freq: Once | INTRAVENOUS | Status: AC
  Administered 2023-01-08: 500 mL via INTRAVENOUS

## 2023-01-08 NOTE — Progress Notes (Signed)
Left upper port site oozing sanguineous drainage. Dry gauze dressing applied at this time. JP site dressing was completely saturated. Dressing was removed and patient has a constant trickle of drainage from port entry site. 2 drain sponges and a 3x3 gauze were applied at this time and will continue to monitor.

## 2023-01-08 NOTE — Progress Notes (Signed)
MD Eskridge notified of trending hgb from 10.5 to 9.7. MD also made aware of patients new O2 requirements, on 3L to maintain at 92% or above. Verbal orders given for strict bed rest and to recheck hgb again at 1700. Primary RN made aware. Will continue to monitor and offer support as needed.

## 2023-01-08 NOTE — Progress Notes (Signed)
Dressing around JP drain is completely dry and intact.

## 2023-01-08 NOTE — Progress Notes (Signed)
1 Day Post-Op Subjective: NAEO. Peri-JP leakage, reinforced with dressings. Improved by this AM. Afebrile, soft pressures to SBP 90s. Pain well-controlled. Tolerating PO. No flatus. JP Cr c/w serum.  Objective: Vital signs in last 24 hours: Temp:  [97.5 F (36.4 C)-98.5 F (36.9 C)] 97.9 F (36.6 C) (05/11 0500) Pulse Rate:  [60-89] 79 (05/11 0500) Resp:  [11-25] 19 (05/11 0500) BP: (92-195)/(57-111) 103/60 (05/11 0500) SpO2:  [91 %-100 %] 98 % (05/11 0500) Arterial Line BP: (147-201)/(67-91) 152/67 (05/10 1400) Weight:  [92.9 kg] 92.9 kg (05/10 1459)  Intake/Output from previous day: 05/10 0701 - 05/11 0700 In: 3180.3 [P.O.:450; I.V.:2397.3; IV Piggyback:333] Out: 955 [Urine:600; Drains:255; Blood:100] Intake/Output this shift: Total I/O In: -  Out: 30 [Drains:30]  Physical Exam:  General: Alert and oriented CV: RRR Lungs: Clear Abdomen: Soft, ND. JP with s/s output Incisions: c/d/I GU: voiding spontaneously Ext: NT, No erythema  Lab Results: Recent Labs    01/07/23 1210 01/07/23 2249 01/08/23 0403  HGB 13.3 11.2* 10.5*  HCT 43.3 35.4* 33.3*   BMET Recent Labs    01/07/23 1210 01/08/23 0403  NA 136 136  K 3.9 4.6  CL 104 105  CO2 23 22  GLUCOSE 145* 118*  BUN 16 21  CREATININE 1.62* 2.37*  CALCIUM 8.3* 7.7*     Studies/Results: No results found.  Assessment/Plan: #Left renal mass s/p partial Nx - JP Cr negative for Cr leak - Cr 2.37 from 1.62; will continue to trend Cr to see where it plateaus - H/H 10.5/33.3; will continue to trend - TOV - Continue mIVF - OOB and ambulate - Regular diet - PRN analgesia   LOS: 1 day   Troy May 01/08/2023, 9:30 AM

## 2023-01-08 NOTE — Progress Notes (Addendum)
Dressing around JP drain site and left upper port site changed again at 1335.  Notified Md regarding drainage and distended abdomen.   Orders given and executed.  Pt stable and VS stable.  Pt and wife educated on bed rest for now.  Pt understands.  Pt did finally voided 200 cc since foley was discontinued around 530am.

## 2023-01-09 LAB — BASIC METABOLIC PANEL
Anion gap: 9 (ref 5–15)
BUN: 18 mg/dL (ref 8–23)
CO2: 17 mmol/L — ABNORMAL LOW (ref 22–32)
Calcium: 7.5 mg/dL — ABNORMAL LOW (ref 8.9–10.3)
Chloride: 105 mmol/L (ref 98–111)
Creatinine, Ser: 2.12 mg/dL — ABNORMAL HIGH (ref 0.61–1.24)
GFR, Estimated: 33 mL/min — ABNORMAL LOW (ref >60)
Glucose, Bld: 107 mg/dL — ABNORMAL HIGH (ref 70–99)
Potassium: 4.7 mmol/L (ref 3.5–5.1)
Sodium: 131 mmol/L — ABNORMAL LOW (ref 135–145)

## 2023-01-09 LAB — CBC
HCT: 27 % — ABNORMAL LOW (ref 39.0–52.0)
Hemoglobin: 8.8 g/dL — ABNORMAL LOW (ref 13.0–17.0)
MCH: 28.9 pg (ref 26.0–34.0)
MCHC: 32.6 g/dL (ref 30.0–36.0)
MCV: 88.5 fL (ref 80.0–100.0)
Platelets: 89 10*3/uL — ABNORMAL LOW (ref 150–400)
RBC: 3.05 MIL/uL — ABNORMAL LOW (ref 4.22–5.81)
RDW: 16.7 % — ABNORMAL HIGH (ref 11.5–15.5)
WBC: 7.8 10*3/uL (ref 4.0–10.5)
nRBC: 0 % (ref 0.0–0.2)

## 2023-01-09 LAB — HEMOGLOBIN AND HEMATOCRIT, BLOOD
HCT: 27.3 % — ABNORMAL LOW (ref 39.0–52.0)
Hemoglobin: 8.7 g/dL — ABNORMAL LOW (ref 13.0–17.0)

## 2023-01-09 NOTE — Progress Notes (Signed)
2 Days Post-Op Subjective: NAEO. Decreased leakage around JP. Pain improving. Continues on bedrest pending PM H/H. 1x BM. Voiding spontaneously.  Objective: Vital signs in last 24 hours: Temp:  [98.2 F (36.8 C)-99.2 F (37.3 C)] 99.1 F (37.3 C) (05/12 1227) Pulse Rate:  [80-87] 80 (05/12 1227) Resp:  [17-27] 17 (05/12 1227) BP: (113-143)/(63-86) 131/77 (05/12 1227) SpO2:  [89 %-97 %] 94 % (05/12 1227)  Intake/Output from previous day: 05/11 0701 - 05/12 0700 In: 1562.8 [P.O.:480; I.V.:882.8; IV Piggyback:200] Out: 1540 [Urine:1400; Drains:140] Intake/Output this shift: Total I/O In: -  Out: 170 [Urine:125; Drains:45]  Physical Exam:  General: Alert and oriented CV: RRR Lungs: Clear Abdomen: Soft, ND. JP with s/s output Incisions: c/d/I GU: voiding spontaneously Ext: NT, No erythema  Lab Results: Recent Labs    01/08/23 1359 01/08/23 1831 01/09/23 0709  HGB 9.7* 10.3* 8.8*  HCT 30.7* 32.4* 27.0*    BMET Recent Labs    01/08/23 0403 01/09/23 0403  NA 136 131*  K 4.6 4.7  CL 105 105  CO2 22 17*  GLUCOSE 118* 107*  BUN 21 18  CREATININE 2.37* 2.12*  CALCIUM 7.7* 7.5*      Studies/Results: No results found.  Assessment/Plan: #Left renal mass s/p partial Nx - JP Cr negative for Cr leak; will remove prior to discharge, but continuing now given Hgb trend - Cr 2.12 from 2.37; will continue to trend Cr  - Hgb 8.8 from 10.3; PM H/H, AM H/H tomorrow - medlock - Bedrest for now; will lift bed restriction if H/H is stable this afternoon - Regular diet - PRN analgesia   LOS: 2 days   Troy May 01/09/2023, 1:39 PM

## 2023-01-10 LAB — BASIC METABOLIC PANEL
Anion gap: 6 (ref 5–15)
BUN: 17 mg/dL (ref 8–23)
CO2: 21 mmol/L — ABNORMAL LOW (ref 22–32)
Calcium: 7.6 mg/dL — ABNORMAL LOW (ref 8.9–10.3)
Chloride: 106 mmol/L (ref 98–111)
Creatinine, Ser: 1.8 mg/dL — ABNORMAL HIGH (ref 0.61–1.24)
GFR, Estimated: 40 mL/min — ABNORMAL LOW (ref >60)
Glucose, Bld: 116 mg/dL — ABNORMAL HIGH (ref 70–99)
Potassium: 4.6 mmol/L (ref 3.5–5.1)
Sodium: 133 mmol/L — ABNORMAL LOW (ref 135–145)

## 2023-01-10 LAB — HEMOGLOBIN AND HEMATOCRIT, BLOOD
HCT: 26.1 % — ABNORMAL LOW (ref 39.0–52.0)
HCT: 29.3 % — ABNORMAL LOW (ref 39.0–52.0)
Hemoglobin: 8.2 g/dL — ABNORMAL LOW (ref 13.0–17.0)
Hemoglobin: 9.2 g/dL — ABNORMAL LOW (ref 13.0–17.0)

## 2023-01-10 LAB — SURGICAL PATHOLOGY

## 2023-01-10 MED ORDER — ACETAMINOPHEN 10 MG/ML IV SOLN
1000.0000 mg | Freq: Four times a day (QID) | INTRAVENOUS | Status: AC
  Administered 2023-01-10 – 2023-01-11 (×4): 1000 mg via INTRAVENOUS
  Filled 2023-01-10 (×4): qty 100

## 2023-01-10 NOTE — TOC CM/SW Note (Signed)
  Transition of Care [facility]) Screening Note   Patient Details  Name: Harjap Todorovich Date of Birth: Oct 12, 1950   Transition of Care [facility]) CM/SW Contact:    Howell Rucks, RN Phone Number: 01/10/2023, 9:29 AM    Transition of Care Department [facility]) has reviewed patient and no TOC needs have been identified at this time. We will continue to monitor patient advancement through interdisciplinary progression rounds. If new patient transition needs arise, please place a TOC consult.

## 2023-01-10 NOTE — Plan of Care (Signed)
  Problem: Respiratory: Goal: Ability to achieve and maintain a regular respiratory rate will improve Outcome: Progressing   Problem: Clinical Measurements: Goal: Diagnostic test results will improve Outcome: Progressing Goal: Respiratory complications will improve Outcome: Progressing   Problem: Activity: Goal: Risk for activity intolerance will decrease Outcome: Progressing   Problem: Pain Managment: Goal: General experience of comfort will improve Outcome: Progressing   Problem: Safety: Goal: Ability to remain free from injury will improve Outcome: Progressing

## 2023-01-10 NOTE — Care Management Important Message (Signed)
Important Message  Patient Details IM Letter given. Name: Troy May MRN: [id number] Date of Birth: [birth year]   Medicare Important Message Given:  Yes     Caren Macadam 01/10/2023, 11:38 AM

## 2023-01-10 NOTE — Progress Notes (Signed)
4 Days Post-Op Subjective: NAEO. Somewhat short of breath - persistent 4L O2 requirement Nausea with walking Voiding/Had several BMs  Objective: Vital signs in last 24 hours: Temp:  [97.8 F (36.6 C)-98.5 F (36.9 C)] 98.1 F (36.7 C) (05/14 0453) Pulse Rate:  [78-87] 87 (05/14 0453) Resp:  [17-25] 17 (05/14 0453) BP: (104-121)/(59-79) 113/75 (05/14 0453) SpO2:  [92 %-97 %] 96 % (05/14 0453)  Intake/Output from previous day: 05/13 0701 - 05/14 0700 In: 1140 [P.O.:840; IV Piggyback:300] Out: 565 [Urine:525; Drains:40] Intake/Output this shift: I/O last 3 completed shifts: In: 1140 [P.O.:840; IV Piggyback:300] Out: 935 [Urine:875; Drains:60]   Physical Exam:  General: Alert and oriented CV: RRR Lungs: Clear - on 4L [place] (96%) Abdomen: Soft, ND. JP with s/s output Incisions: c/d/I GU: voiding spontaneously Ext: NT, No erythema, pitting edema to knees  Lab Results: Recent Labs    01/09/23 1555 01/10/23 0348 01/10/23 1128  HGB 8.7* 8.2* 9.2*  HCT 27.3* 26.1* 29.3*   BMET Recent Labs    01/09/23 0403 01/10/23 0348  NA 131* 133*  K 4.7 4.6  CL 105 106  CO2 17* 21*  GLUCOSE 107* 116*  BUN 18 17  CREATININE 2.12* 1.80*  CALCIUM 7.5* 7.6*     Studies/Results: No results found.  Assessment/Plan: #Left renal mass s/p partial Nx - JP Cr negative for Cr leak; will remove prior to discharge - Creatinine continuing to improve - Hgb stable - medlock - Regular diet (heart healthy) - Schedule IV tylenol - Schedule zofran for persistent nausea - Restart Entresto, Torsemide, Give Lasix 40mg  now - CXR - suspect CHF as ideology of persistent O2 requirement, wean O2 as tolerated, Incentive spirometery, Ambulate Re-evaluate this afternoon for discharge   LOS: 4 days   Crist Fat 01/11/2023, 7:36 AM

## 2023-01-11 ENCOUNTER — Inpatient Hospital Stay (HOSPITAL_COMMUNITY): Payer: Medicare Other

## 2023-01-11 LAB — CBC
HCT: 27.2 % — ABNORMAL LOW (ref 39.0–52.0)
Hemoglobin: 8.5 g/dL — ABNORMAL LOW (ref 13.0–17.0)
MCH: 28.2 pg (ref 26.0–34.0)
MCHC: 31.3 g/dL (ref 30.0–36.0)
MCV: 90.4 fL (ref 80.0–100.0)
Platelets: 100 10*3/uL — ABNORMAL LOW (ref 150–400)
RBC: 3.01 MIL/uL — ABNORMAL LOW (ref 4.22–5.81)
RDW: 17 % — ABNORMAL HIGH (ref 11.5–15.5)
WBC: 7.5 10*3/uL (ref 4.0–10.5)
nRBC: 0.5 % — ABNORMAL HIGH (ref 0.0–0.2)

## 2023-01-11 LAB — BASIC METABOLIC PANEL
Anion gap: 8 (ref 5–15)
BUN: 21 mg/dL (ref 8–23)
CO2: 19 mmol/L — ABNORMAL LOW (ref 22–32)
Calcium: 7.8 mg/dL — ABNORMAL LOW (ref 8.9–10.3)
Chloride: 103 mmol/L (ref 98–111)
Creatinine, Ser: 1.8 mg/dL — ABNORMAL HIGH (ref 0.61–1.24)
GFR, Estimated: 40 mL/min — ABNORMAL LOW (ref >60)
Glucose, Bld: 136 mg/dL — ABNORMAL HIGH (ref 70–99)
Potassium: 3.9 mmol/L (ref 3.5–5.1)
Sodium: 130 mmol/L — ABNORMAL LOW (ref 135–145)

## 2023-01-11 LAB — GLUCOSE, CAPILLARY
Glucose-Capillary: 127 mg/dL — ABNORMAL HIGH (ref 70–99)
Glucose-Capillary: 133 mg/dL — ABNORMAL HIGH (ref 70–99)
Glucose-Capillary: 134 mg/dL — ABNORMAL HIGH (ref 70–99)
Glucose-Capillary: 148 mg/dL — ABNORMAL HIGH (ref 70–99)

## 2023-01-11 MED ORDER — ACETAMINOPHEN 10 MG/ML IV SOLN
1000.0000 mg | Freq: Four times a day (QID) | INTRAVENOUS | Status: AC
  Administered 2023-01-11 – 2023-01-12 (×4): 1000 mg via INTRAVENOUS
  Filled 2023-01-11 (×4): qty 100

## 2023-01-11 MED ORDER — FUROSEMIDE 10 MG/ML IJ SOLN
40.0000 mg | Freq: Once | INTRAMUSCULAR | Status: AC
  Administered 2023-01-11: 40 mg via INTRAVENOUS
  Filled 2023-01-11: qty 4

## 2023-01-11 MED ORDER — TORSEMIDE 20 MG PO TABS
20.0000 mg | ORAL_TABLET | Freq: Every day | ORAL | Status: DC
  Administered 2023-01-11: 20 mg via ORAL
  Filled 2023-01-11 (×2): qty 1

## 2023-01-11 MED ORDER — INSULIN ASPART 100 UNIT/ML IJ SOLN: 0.0000 [IU] | INTRAMUSCULAR | Status: DC

## 2023-01-11 MED ORDER — ONDANSETRON HCL 4 MG/2ML IJ SOLN
4.0000 mg | Freq: Four times a day (QID) | INTRAMUSCULAR | Status: DC
  Administered 2023-01-11 – 2023-01-13 (×9): 4 mg via INTRAVENOUS
  Filled 2023-01-11 (×9): qty 2

## 2023-01-11 NOTE — Progress Notes (Signed)
Notified by bedside RN of "yellow MEWS". No significant changes in patient condition reported or documented. Current VS: Pulse 89, respirations 24, oxygen saturation 95%, last BP done at 2057 is 94/65 with MAP of 73, last temperature is 97.8 f but this was done at 1227. Bedside RN will continue to monitor.

## 2023-01-11 NOTE — Progress Notes (Signed)
   01/11/23 2057  Vitals  BP 94/65  MAP (mmHg) 73  BP Location Right Arm  BP Method Automatic  Patient Position (if appropriate) Sitting  Pulse Rate 90  Pulse Rate Source Monitor  ECG Heart Rate 91  Resp (!) 22  MEWS COLOR  MEWS Score Color Yellow  Oxygen Therapy  SpO2 92 %  O2 Device Nasal Cannula  O2 Flow Rate (L/min) 6 L/min  MEWS Score  MEWS Temp 0  MEWS Systolic 1  MEWS Pulse 0  MEWS RR 1  MEWS LOC 0  MEWS Score 2   Start Yellow MEWS protocol and notified to CN. Will continue to monitor.

## 2023-01-12 ENCOUNTER — Inpatient Hospital Stay (HOSPITAL_COMMUNITY): Payer: Medicare Other

## 2023-01-12 ENCOUNTER — Other Ambulatory Visit (HOSPITAL_COMMUNITY): Payer: Medicare Other

## 2023-01-12 DIAGNOSIS — I480 Paroxysmal atrial fibrillation: Secondary | ICD-10-CM

## 2023-01-12 DIAGNOSIS — N2889 Other specified disorders of kidney and ureter: Secondary | ICD-10-CM

## 2023-01-12 DIAGNOSIS — I2510 Atherosclerotic heart disease of native coronary artery without angina pectoris: Secondary | ICD-10-CM | POA: Diagnosis not present

## 2023-01-12 DIAGNOSIS — I5082 Biventricular heart failure: Secondary | ICD-10-CM

## 2023-01-12 DIAGNOSIS — I5043 Acute on chronic combined systolic (congestive) and diastolic (congestive) heart failure: Secondary | ICD-10-CM | POA: Diagnosis not present

## 2023-01-12 DIAGNOSIS — E119 Type 2 diabetes mellitus without complications: Secondary | ICD-10-CM

## 2023-01-12 LAB — CBC WITH DIFFERENTIAL/PLATELET
Abs Immature Granulocytes: 0.05 10*3/uL (ref 0.00–0.07)
Basophils Absolute: 0 10*3/uL (ref 0.0–0.1)
Basophils Relative: 0 %
Eosinophils Absolute: 0.1 10*3/uL (ref 0.0–0.5)
Eosinophils Relative: 1 %
HCT: 27.3 % — ABNORMAL LOW (ref 39.0–52.0)
Hemoglobin: 8.5 g/dL — ABNORMAL LOW (ref 13.0–17.0)
Immature Granulocytes: 1 %
Lymphocytes Relative: 19 %
Lymphs Abs: 1.5 10*3/uL (ref 0.7–4.0)
MCH: 28.1 pg (ref 26.0–34.0)
MCHC: 31.1 g/dL (ref 30.0–36.0)
MCV: 90.4 fL (ref 80.0–100.0)
Monocytes Absolute: 0.8 10*3/uL (ref 0.1–1.0)
Monocytes Relative: 10 %
Neutro Abs: 5.4 10*3/uL (ref 1.7–7.7)
Neutrophils Relative %: 69 %
Platelets: 120 10*3/uL — ABNORMAL LOW (ref 150–400)
RBC: 3.02 MIL/uL — ABNORMAL LOW (ref 4.22–5.81)
RDW: 17.1 % — ABNORMAL HIGH (ref 11.5–15.5)
WBC: 7.8 10*3/uL (ref 4.0–10.5)
nRBC: 0.9 % — ABNORMAL HIGH (ref 0.0–0.2)

## 2023-01-12 LAB — GLUCOSE, CAPILLARY
Glucose-Capillary: 108 mg/dL — ABNORMAL HIGH (ref 70–99)
Glucose-Capillary: 109 mg/dL — ABNORMAL HIGH (ref 70–99)
Glucose-Capillary: 110 mg/dL — ABNORMAL HIGH (ref 70–99)

## 2023-01-12 LAB — BASIC METABOLIC PANEL
Anion gap: 9 (ref 5–15)
BUN: 30 mg/dL — ABNORMAL HIGH (ref 8–23)
CO2: 21 mmol/L — ABNORMAL LOW (ref 22–32)
Calcium: 8 mg/dL — ABNORMAL LOW (ref 8.9–10.3)
Chloride: 101 mmol/L (ref 98–111)
Creatinine, Ser: 2.24 mg/dL — ABNORMAL HIGH (ref 0.61–1.24)
GFR, Estimated: 31 mL/min — ABNORMAL LOW (ref >60)
Glucose, Bld: 114 mg/dL — ABNORMAL HIGH (ref 70–99)
Potassium: 3.9 mmol/L (ref 3.5–5.1)
Sodium: 131 mmol/L — ABNORMAL LOW (ref 135–145)

## 2023-01-12 LAB — BRAIN NATRIURETIC PEPTIDE: B Natriuretic Peptide: 1837.4 pg/mL — ABNORMAL HIGH (ref 0.0–100.0)

## 2023-01-12 MED ORDER — FUROSEMIDE 10 MG/ML IJ SOLN
40.0000 mg | Freq: Once | INTRAMUSCULAR | Status: AC
  Administered 2023-01-12: 40 mg via INTRAVENOUS
  Filled 2023-01-12: qty 4

## 2023-01-12 MED ORDER — HEPARIN SODIUM (PORCINE) 5000 UNIT/ML IJ SOLN
5000.0000 [IU] | Freq: Two times a day (BID) | INTRAMUSCULAR | Status: DC
  Administered 2023-01-12 – 2023-01-13 (×3): 5000 [IU] via SUBCUTANEOUS
  Filled 2023-01-12 (×3): qty 1

## 2023-01-12 MED ORDER — FUROSEMIDE 10 MG/ML IJ SOLN
40.0000 mg | Freq: Once | INTRAMUSCULAR | Status: DC
  Filled 2023-01-12: qty 4

## 2023-01-12 MED ORDER — EMPAGLIFLOZIN 10 MG PO TABS
10.0000 mg | ORAL_TABLET | Freq: Every day | ORAL | Status: DC
  Administered 2023-01-12 – 2023-01-13 (×2): 10 mg via ORAL
  Filled 2023-01-12 (×3): qty 1

## 2023-01-12 NOTE — Progress Notes (Signed)
Pt's urine color turned from yellow to red around 2040. No complained of any pain but still tenderness on JP site per pt. Called and left message Dr. Marlou Porch about it.   At 2217, RN checked pt again and pt's urine color turned to yellow. Will continue to monitor.

## 2023-01-12 NOTE — Progress Notes (Signed)
Heart Failure Navigator Progress Note  Assessed for Heart & Vascular TOC clinic readiness.  Patient does not meet criteria due to Advanced Heart Failure Patient of Dr. Bensimhon..   Navigator will sign off at this time.   Usama Harkless, BSN, RN Heart Failure Nurse Navigator Secure Chat Only   

## 2023-01-12 NOTE — Consult Note (Addendum)
Cardiology Consultation   Patient ID: Troy May MRN: [id number]; DOB: [birth year]  Admit date: 01/07/2023 Date of Consult: 01/12/2023  PCP:  Georgina Quint, MD   [facility] Providers Cardiologist:  None  Advanced Heart Failure:  Arvilla Meres, MD  {    Patient Profile:   Troy May is a 72 y.o. male with a history of severe 2-vessel CAD with CTO of ostial to proximal RCA and mid to distal LAD on cardiac catheterization in 06/2021 (areas not viable on cardiac MRI), ischemic cardiomyopathy with biventricular CHF and LVEF of 25-30% in 06/2022, LV thrombus on Echo in 06/2021 (resolved on last Echo in 06/2022 but remains on Xraelto), dilatated ascending thoracic aorta,  hypertension, hyperlipidemia, CKD stage III, and bilateral renal masses suspicious for renal cell carcinoma  who is being seen 01/12/2023 for the evaluation of acute on chronic CHF at the request of Dr. Marlou Porch.  History of Present Illness:   [name] is a 72 year old male with the above history who is followed by Dr. Gala Romney. Patient was admitted in 06/2021 for chest pain and influenza A. Echo showed new biventricular failure with LVEF of 25-30% with akinesis of the mid and distal lateral wall, entire apex, entire inferior wall, and posterior wall as well as grade 2 diastolic dysfunction and a large LV apical thrombus. RV was moderately enlarged with moderately reduced systolic function. R/LHC showed severe 2 vessel CAD with CTO of ostial to proximal RCA and mid to distal LAD as well as elevated filling pressures and low cardiac output. Patient was diuresed and GDMT was started. He was also started on anticoagulation for his LV thrombus. Cardiac MRI showed scar in the LAD and RCA territories that did not appear viable. During this admission, patient was also found to have new bilateral renal masses concerning for renal cell carcinoma and cirrhosis. He was referred to Urology. EF has remained low. He was  referred to EP for consideration of ICD and was seen by Dr. Graciela Husbands but patient wanted to think about ICD more. Last Echo in 06/2022 showed LVEF of 25-30% with akinesis of the entire apex and hypokinesis of the mid anterior segment but resolution of apical thrombus. Patient was last seen by Dr. Gala Romney on 12/17/2022 at which time he was doing well from a cardiac standpoint. He mentioned need for upcoming surgery for resection of his RCC in May and was felt to be at acceptable risk for this.  Patient presented to [facility] on 01/07/2023 for planned robotic assisted left partial nephrectomy with Dr. Marlou Porch. Patient tolerated the procedure well with no immediate complications. He has had some peri-JP leakage and a drop in H/H post-operatively. He has also continued to require supplemental O2. Chest x-ray on 5/14 showed mild cardiac enlargement and small left pleural effusion with basilar atelectasis and he was given one dose of IV Lasix with good response but has continued to require O2 and creatinine increased from 1.8 to 2.24 with this. Repeat chest x-ray today showed persistent small left pleural effusion and BNP was elevated at 1,837. Therefore, Cardiology consulted for further evaluatoin.  Patient reports he was doing well from a cardiac standpoint prior to surgery with no chest pain or shortness of breath. He sleeps on an incline at baseline but it sounds like this is more due to comfort rather than orthopnea. He was not having any PND or edema at home. However, he states every since his surgery, he has been requiring supplemental  O2 and notes shortness of breath with activity. He is currently on 4L of O2. He denies any shortness of breath at rest but notes it with ambulating. He is sleeping at about a 35-40 degree angle but this is similar to what he does at home. He denies any PND but has started to have a little pedal edema. He also feels like his abdomen may be firmer than usual. He denies any  chest pain. No lightheadedness, dizziness, or syncope. He continues to have some abdominal pain after surgery. He had a bowel movement yesterday but it was very loose.   Past Medical History:  Diagnosis Date   Ascending aortic aneurysm (HCC)    Bilateral renal masses    CAD (coronary artery disease)    CHF (congestive heart failure) (HCC)    HLD (hyperlipidemia)    Hypertension    LV (left ventricular) mural thrombus    Systolic heart failure (HCC)     Past Surgical History:  Procedure Laterality Date   CARDIAC CATHETERIZATION     RIGHT/LEFT HEART CATH AND CORONARY ANGIOGRAPHY N/A 07/27/2021   Procedure: RIGHT/LEFT HEART CATH AND CORONARY ANGIOGRAPHY;  Surgeon: Dolores Patty, MD;  Location: [facility] INVASIVE CV [facility];  Service: Cardiovascular;  Laterality: N/A;   ROBOTIC ASSITED PARTIAL NEPHRECTOMY Left 01/07/2023   Procedure: XI ROBOTIC ASSITED LEFT PARTIAL NEPHRECTOMY;  Surgeon: Crist Fat, MD;  Location: [facility] ORS;  Service: Urology;  Laterality: Left;  210 MINUTES     Home Medications:  Prior to Admission medications   Medication Sig Start Date End Date Taking? Authorizing Provider  aspirin EC 81 MG tablet Take 1 tablet (81 mg total) by mouth daily. Swallow whole. 02/05/22  Yes Andrey Farmer, PA-C  empagliflozin (JARDIANCE) 10 MG TABS tablet Take 1 tablet (10 mg total) by mouth daily. 02/05/22  Yes Andrey Farmer, PA-C  metoprolol succinate (TOPROL XL) 50 MG 24 hr tablet Take 1 tablet (50 mg total) by mouth at bedtime. 04/15/22  Yes Bensimhon, Bevelyn Buckles, MD  Multiple Vitamin (MULTIVITAMIN WITH MINERALS) TABS tablet Take 1 tablet by mouth daily.   Yes [provider]  rivaroxaban (XARELTO) 20 MG TABS tablet Take 1 tablet (20 mg total) by mouth daily with supper. 02/05/22  Yes Andrey Farmer, PA-C  sacubitril-valsartan (ENTRESTO) 97-103 MG Take 1 tablet by mouth 2 (two) times daily. 03/19/22  Yes Milford, Anderson Malta, FNP  spironolactone (ALDACTONE) 25 MG  tablet TAKE 1/2 TABLET BY MOUTH EVERY DAY 11/25/22  Yes Andrey Farmer, PA-C  torsemide (DEMADEX) 20 MG tablet Take 1 tablet (20 mg total) by mouth daily as needed (fluid retention). 02/05/22  Yes Andrey Farmer, PA-C  traMADol (ULTRAM) 50 MG tablet Take 1-2 tablets (50-100 mg total) by mouth every 6 (six) hours as needed for moderate pain. 01/07/23  Yes Crist Fat, MD  atorvastatin (LIPITOR) 80 MG tablet Take 1 tablet (80 mg total) by mouth daily. Patient not taking: Reported on 12/29/2022 02/05/22   Andrey Farmer, PA-C    Inpatient Medications: Scheduled Meds:  docusate sodium  100 mg Oral BID   furosemide  40 mg Intravenous Once   heparin injection (subcutaneous)  5,000 Units Subcutaneous Q12H   insulin aspart  0-6 Units Subcutaneous Q4H   metoprolol succinate  50 mg Oral QHS   ondansetron (ZOFRAN) IV  4 mg Intravenous Q6H   torsemide  20 mg Oral Daily   Continuous Infusions:  PRN Meds: diphenhydrAMINE **OR** diphenhydrAMINE, traMADol  Allergies:  No Known Allergies  Social History:   Social History   Socioeconomic History   Marital status: Married    Spouse name: Not on file   Number of children: 2   Years of education: 12   Highest education level: High school graduate  Occupational History   Occupation: Retired  Tobacco Use   Smoking status: Former    Types: Cigarettes    Quit date: 07/27/2021    Years since quitting: 1.4   Smokeless tobacco: Never  Vaping Use   Vaping Use: Never used  Substance and Sexual Activity   Alcohol use: Not Currently   Drug use: Never   Sexual activity: Not on file  Other Topics Concern   Not on file  Social History Narrative   Not on file   Social Determinants of Health   Financial Resource Strain: Low Risk  (09/03/2022)   Overall Financial Resource Strain (CARDIA)    Difficulty of Paying Living Expenses: Not hard at all  Food Insecurity: No Food Insecurity (01/07/2023)   Hunger Vital Sign    Worried  About Running Out of Food in the Last Year: Never true    Ran Out of Food in the Last Year: Never true  Transportation Needs: No Transportation Needs (01/07/2023)   PRAPARE - Administrator, Civil Service (Medical): No    Lack of Transportation (Non-Medical): No  Physical Activity: Sufficiently Active (09/03/2022)   Exercise Vital Sign    Days of Exercise per Week: 5 days    Minutes of Exercise per Session: 30 min  Stress: No Stress Concern Present (09/03/2022)   [employer] of Occupational Health - Occupational Stress Questionnaire    Feeling of Stress : Not at all  Social Connections: Socially Integrated (09/03/2022)   Social Connection and Isolation Panel [NHANES]    Frequency of Communication with Friends and Family: More than three times a week    Frequency of Social Gatherings with Friends and Family: More than three times a week    Attends Religious Services: More than 4 times per year    Active Member of [employer] or Organizations: Yes    Attends Engineer, structural: More than 4 times per year    Marital Status: Married  Catering manager Violence: Not At Risk (01/07/2023)   Humiliation, Afraid, Rape, and Kick questionnaire    Fear of Current or Ex-Partner: No    Emotionally Abused: No    Physically Abused: No    Sexually Abused: No    Family History:   Family History  Problem Relation Age of Onset   Hypertension Brother    Heart failure Neg Hx      ROS:  Please see the history of present illness.  All other ROS reviewed and negative.     Physical Exam/Data:   Vitals:   01/11/23 2310 01/12/23 0022 01/12/23 0300 01/12/23 0700  BP: 114/76  115/78 110/70  Pulse: 82  80 70  Resp: (!) 22  (!) 22 (!) 22  Temp:      TempSrc:      SpO2: 94% 92% 98% 94%  Weight:      Height:        Intake/Output Summary (Last 24 hours) at 01/12/2023 0938 Last data filed at 01/12/2023 0619 Gross per 24 hour  Intake 917 ml  Output 2188 ml  Net -1271 ml       01/07/2023    2:59 PM 01/07/2023    6:06 AM 12/31/2022  10:42 AM  Last 3 Weights  Weight (lbs) 204 lb 12.9 oz 202 lb 13.2 oz 202 lb 13.2 oz  Weight (kg) 92.9 kg 92 kg 92 kg     Body mass index is 29.39 kg/m.  General: 72 y.o. African-American male resting comfortably in no acute distress. On 4L of high flow nasal cannula. HEENT: Normocephalic and atraumatic. Sclera clear.  Neck: Supple. External jugular veins look mildly distended. Heart: RRR. Distinct S1 and S2. No murmurs, gallops, or rubs. Radial pulses 2+ and equal bilaterally. Lungs: No increased work of breathing. Possible very faint crackles in bilateral bases but otherwise lung clear to ausculation. .  Abdomen: Soft and distended. JP in place. Incision clean and dry with no signs of infection. MSK: Normal strength and tone for age. Extremities: Trace lower extremity edema bilaterally (worse in the feet).    Skin: Warm and dry. Neuro: Alert and oriented x3. No focal deficits. Psych: Normal affect. Responds appropriately.  EKG:  The EKG was personally reviewed and demonstrates:  No EKG this admission. Telemetry:  Telemetry was personally reviewed and demonstrates:  Normal sinus rhythm with PVCs and ventricular couplets. Rates in the 70s to 80s.  Relevant CV Studies:  Right/ Left Cardiac Catheterization 07/27/2021:   Ost RCA to Prox RCA lesion is 100% stenosed.   Prox Cx to Mid Cx lesion is 30% stenosed.   1st Mrg lesion is 20% stenosed.   Prox LAD lesion is 60% stenosed.   Mid LAD lesion is 90% stenosed.   Mid LAD to Dist LAD lesion is 100% stenosed.   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 Continue diuresis. Titrate  GDMT. Place PICC. Will need cMRI to assess viability of LAD territory but suspect minimal viability based on echo.    Will discuss management of RCC with IR with possible embolization.   Diagnostic Dominance: Right  _______________  Cardiac MRI 07/28/2021: Impression: Severe LV dysfunction. LAD and RCA territories with scar due not appear viable.   Large LV thrombus. _______________  Echocardiogram 07/02/2022: Impressions:  1. The left ventricle demonstrates regional wall motion abnormalities  (Apical infarct without LV thrombus, evidence of LAD and RCA infarct, LVEF  25-30%). There is moderate concentric left ventricular hypertrophy. Left  ventricular diastolic parameters  are consistent with Grade I diastolic dysfunction (impaired relaxation).   2. Right ventricular systolic function is normal. The right ventricular  size is normal. There is normal pulmonary artery systolic pressure.   3. Left atrial size was mildly dilated.   4. The mitral valve is normal in structure. Mild mitral valve  regurgitation. No evidence of mitral stenosis.   5. Tricuspid valve regurgitation is mild to moderate.   6. The aortic valve is tricuspid. Aortic valve regurgitation is not  visualized.   7. Aortic dilatation noted. There is mild dilatation of the ascending  aorta, measuring 41 mm.   8. The inferior vena cava is normal in size with greater than 50%  respiratory variability, suggesting right atrial pressure of 3 mmHg.   Comparison(s): Resolution of apical thrombus; study done with contrast.    Laboratory Data:  High Sensitivity Troponin:  No results for input(s): "TROPONINIHS" in the last 720 hours.   Chemistry Recent Labs  Lab 01/10/23 0348 01/11/23 0819 01/12/23 0801  NA 133* 130* 131*  K 4.6 3.9 3.9  CL 106 103 101  CO2 21* 19* 21*  GLUCOSE 116* 136* 114*  BUN 17 21 30*  CREATININE 1.80* 1.80* 2.24*  CALCIUM 7.6* 7.8* 8.0*  GFRNONAA 40* 40* 31*  ANIONGAP 6 8 9     No  results for input(s): "PROT", "ALBUMIN", "AST", "ALT", "ALKPHOS", "BILITOT" in the last 168 hours. Lipids No results for input(s): "CHOL", "TRIG", "HDL", "LABVLDL", "LDLCALC", "CHOLHDL" in the last 168 hours.  Hematology Recent Labs  Lab 01/09/23 0709 01/09/23 1555 01/10/23 1128 01/11/23 0819 01/12/23 0801  WBC 7.8  --   --  7.5 7.8  RBC 3.05*  --   --  3.01* 3.02*  HGB 8.8*   < > 9.2* 8.5* 8.5*  HCT 27.0*   < > 29.3* 27.2* 27.3*  MCV 88.5  --   --  90.4 90.4  MCH 28.9  --   --  28.2 28.1  MCHC 32.6  --   --  31.3 31.1  RDW 16.7*  --   --  17.0* 17.1*  PLT 89*  --   --  100* 120*   < > = values in this interval not displayed.   Thyroid No results for input(s): "TSH", "FREET4" in the last 168 hours.  BNP Recent Labs  Lab 01/12/23 0801  BNP 1,837.4*    DDimer No results for input(s): "DDIMER" in the last 168 hours.   Radiology/Studies:  DG CHEST PORT 1 VIEW  Result Date: 01/12/2023 CLINICAL DATA:  Shortness of breath. EXAM: PORTABLE CHEST 1 VIEW COMPARISON:  01/11/2023 FINDINGS: Normal heart size. Similar appearance of small left pleural effusion. No interstitial edema or airspace disease. Visualized osseous structures are unremarkable. IMPRESSION: Persistent small left pleural effusion. Electronically Signed   By: Signa Kell M.D.   On: 01/12/2023 07:59   DG Chest 2 View  Result Date: 01/11/2023 CLINICAL DATA:  Shortness of breath. History of CHF and hypertension. EXAM: CHEST - 2 VIEW COMPARISON:  07/24/2021 FINDINGS: Cardiac enlargement. No vascular congestion or edema. Small left pleural effusion with basilar atelectasis. No pneumothorax. Mediastinal contours appear intact. Degenerative changes in the spine and shoulders. IMPRESSION: 1. Mild cardiac enlargement. 2. Small left pleural effusion with basilar atelectasis. Electronically Signed   By: Burman Nieves M.D.   On: 01/11/2023 15:29     Assessment and Plan:   Acute on Chronic BiVentricular CHF Ischemic  Cardiomyopathy Patient has a history of ischemic cardiomyopathy and biventricular CHF. Initially diagnosed in 06/2021.  Last Echo in 06/2022 showed LVEF of 25-30% with persistent wall motion abnormalities and grade 1 diastolic dysfunction. RV normal. He has been doing well from a CHF standpoint prior to this admission. He was admitted for a planned partial left nephrectomy and has required supplemental O2 since discharge. Currently on 4L of high flow nasal cannula (not on any O2 at home). Chest x-ray showed small left pleural effusion with basilar atelectasis. Suspect this is due to IV fluids peri-operatively. He received 1 dose of IV Lasix yesterday with 2.18 L of urinary output yesterday. However, still net positive 947 mL this admission. Creatinine bumped from 1.8 yesterday to 2.24 today. However, baseline creatinine around 1.4 to 1.6. - He has evidence of mild volume overload on exam with minimal pedal edema and very faint bibasilar crackles (which I  suspect is atelectasis). His abdomen does look distended but wife states that is normal. - Repeat Echo has been ordered but I think we can hold off on this for now.  - Will hold off on additional diuretics for now given bump in creatinine and recent partial nephrectomy and discuss with MD.  - Home Entresto and Spironolactone were held on admission. Continue to hold for now. - Continue Toprol-XL 50mg  daily. - Can resume home Jardiance 10mg  daily.  - Continue to monitor daily weight, strict I/Os, and renal function.  CAD History of severe 2 vessel CAD with CTO of RCA and LAD on cardiac catheterization in 06/2021. Cardiac MRI at that time showed scar in RCA and LAD territories with no viability.  - No chest pain.  - Resume Aspirin 81mg  daily when OK with Urology. - Continue beta-blocker and statin.  LV Thrombus Noted on Echo in 06/2021. Last Echo in 06/2022 showed complete resolution of thrombus but he has remained on Xarelto given reduced EF. -  Restart Xarelto when okay with Urology. Will need reduced dosing of 15mg  daily given CrCl <51%.  Hypertension BP soft but stable.  - Continue home Toprol-XL as above - Home Entresto and Spironolactone on hold given worsening renal function as above.  Hyperlipidemia - Continue Lipitor 80mg  daily.  CKD Stage III Creatinine 1.38 on admission Baseline around 1.4 to 1.6. Creatinine has fluctuated this admission after partial left nephrectomy.  - Creatinine up from 1.80 yesterday to 2.24 today after dose of IV Lasix. - Will hold additional diuretics for now as above. - Continue to monitor closely.   Otherwise, per primary team:    Risk Assessment/Risk Scores:    New York Heart Association (NYHA) Functional Class NYHA Class II   For questions or updates, please contact [facility] Please consult [email or web address] for contact info under    Signed, Corrin Parker, PA-C  01/12/2023 9:38 AM  I have seen and examined the patient along with Corrin Parker, PA-C.  I have reviewed the chart, notes and new data.  I agree with PA/NP's note.  Key new complaints: Partial improvement in breathing with diuresis, but he is still dyspneic and requiring oxygen supplementation.  Pain is well-controlled.  Minimal bleeding in his drain. Key examination changes: No significant peripheral edema, JVP 5-6 cm, diminished breath sounds in both bases probably due to atelectasis, no moist rales are heard.  Good rate and rhythm, no murmurs. Key new findings / data: Creatinine is higher, but this is not unexpected after surgery with removal of a large segment of kidney tissue.  PLAN: I think he is still hypervolemic and requires another dose of diuretics.  Will also resume his Jardiance.  Depending on kidney function parameters and blood pressure tomorrow will also restart Entresto and or spironolactone.  Thurmon Fair, MD, [facility] [phone] 01/12/2023, 2:24 PM

## 2023-01-12 NOTE — Progress Notes (Signed)
5 Days Post-Op Subjective: BP on the low side, continued SOB, unable to wean O2 CXR consistent with atelectasis - small pulmonary effusion JP drain site leaking, dressing changed. Given lasix 40mg  x 1, restarted Torsemide   Objective: Vital signs in last 24 hours: Temp:  [97.8 F (36.6 C)] 97.8 F (36.6 C) (05/14 1227) Pulse Rate:  [70-91] 70 (05/15 0700) Resp:  [21-23] 22 (05/15 0700) BP: (90-115)/(63-83) 110/70 (05/15 0700) SpO2:  [90 %-98 %] 94 % (05/15 0700)  Intake/Output from previous day: 05/14 0701 - 05/15 0700 In: 1157 [P.O.:957; IV Piggyback:200] Out: 2188 [Urine:2105; Drains:83] Intake/Output this shift: I/O last 3 completed shifts: In: 2057 [P.O.:1557; IV Piggyback:500] Out: 2613 [Urine:2505; Drains:108]   Physical Exam:  General: Alert and oriented CV: RRR Lungs: Diminished in bases w/ atelectasis -  on 4L [place] (96%) Abdomen: Soft, ND. JP with s/s output Incisions: c/d/I GU: voiding spontaneously Ext: NT, No erythema, pitting edema to knees  Lab Results: Recent Labs    01/10/23 0348 01/10/23 1128 01/11/23 0819  HGB 8.2* 9.2* 8.5*  HCT 26.1* 29.3* 27.2*    BMET Recent Labs    01/10/23 0348 01/11/23 0819  NA 133* 130*  K 4.6 3.9  CL 106 103  CO2 21* 19*  GLUCOSE 116* 136*  BUN 17 21  CREATININE 1.80* 1.80*  CALCIUM 7.6* 7.8*      Studies/Results: DG Chest 2 View  Result Date: 01/11/2023 CLINICAL DATA:  Shortness of breath. History of CHF and hypertension. EXAM: CHEST - 2 VIEW COMPARISON:  07/24/2021 FINDINGS: Cardiac enlargement. No vascular congestion or edema. Small left pleural effusion with basilar atelectasis. No pneumothorax. Mediastinal contours appear intact. Degenerative changes in the spine and shoulders. IMPRESSION: 1. Mild cardiac enlargement. 2. Small left pleural effusion with basilar atelectasis. Electronically Signed   By: Burman Nieves M.D.   On: 01/11/2023 15:29    Assessment/Plan: #Left renal mass s/p partial Nx - JP  Cr negative for Cr leak; will remove prior to discharge - Creatinine continuing to improve - Hgb stable - medlock - Regular diet (heart healthy) - Schedule IV tylenol - Schedule zofran for persistent nausea  #Persistent O2 requirement - suspect CHF as ideology of persistent O2 requirement Another dose of Lasix 40mg  x 1 Checking BNP Consulting hospital medicine for further treatment wean O2 as tolerated, Incentive spirometery, Ambulate May need to go home with home O2 if unable to wean oxygen.    LOS: 5 days   Crist Fat 01/12/2023, 7:40 AM

## 2023-01-12 NOTE — Consult Note (Addendum)
Initial Consultation Note   Patient: Troy May WUJ:[id number] DOB: Dec 31, 1950 PCP: Georgina Quint, MD DOA: 01/07/2023 DOS: the patient was seen and examined on 01/12/2023 Primary service: Crist Fat, MD  Referring physician: Berniece Salines, MD Reason for consult: Acute on chronic combined CHF.  Assessment/Plan: Principal Problem:   Renal mass   Left renal mass Continue postop management per primary team.  Active Problems:   Acute on chronic combined systolic and diastolic heart failure (HCC) Observation/telemetry. Supplemental oxygen as needed. Sodium and fluid restriction. On Toprol-XL and Entresto. He received furosemide 40 mg IVP earlier today. Spironolactone and Jardiance are currently on hold. Monitor daily weights, intake and output. Monitor renal function and electrolytes. Check echocardiogram. Cardiology consulted.    AKI (acute kidney injury) (HCC) In the setting of acute heart failure. Will defer diuretic use to cardiology. Follow-up renal function and electrolytes in AM.    Essential hypertension Continue metoprolol succinate 50 mg p.o. daily. On Entresto 97/103 mg p.o. twice daily. Monitor BP, HR, renal function and electrolytes.    Left ventricular apical thrombus Xarelto was held prior to procedure.    Coronary artery disease of native artery of  native heart with stable angina pectoris (HCC) On beta-blocker. Statin, aspirin and DOAC on hold.    Dyslipidemia Atorvastatin currently being held.    Type 2 diabetes mellitus (HCC)  Carbohydrate modified diet. Continue CBG monitoring with RI SS.   TRH will continue to follow the patient.  HPI: Troy May is a 72 y.o. male with past medical history of left ventricle mural thrombus, ascending aortic aneurysm, chronic combined systolic and diastolic heart failure, CAD, bilateral renal masses, hyperlipidemia who we are being consulted for dyspnea and new oxygen requirement. He denied fever,  chills, rhinorrhea, sore throat, wheezing or hemoptysis.  No chest pain, palpitations, diaphoresis, PND, passout positive orthopnea, lower extremities and bilateral hands edema.  No nausea, emesis, diarrhea, constipation, melena or hematochezia.  No flank pain or dysuria.  No polyuria, polydipsia, polyphagia or blurred vision.   Review of Systems: As mentioned in the history of present illness. All other systems reviewed and are negative. Past Medical History:  Diagnosis Date   Ascending aortic aneurysm (HCC)    Bilateral renal masses    CAD (coronary artery disease)    CHF (congestive heart failure) (HCC)    HLD (hyperlipidemia)    Hypertension    LV (left ventricular) mural thrombus    Systolic heart failure (HCC)    Past Surgical History:  Procedure Laterality Date   CARDIAC CATHETERIZATION     RIGHT/LEFT HEART CATH AND CORONARY ANGIOGRAPHY N/A 07/27/2021   Procedure: RIGHT/LEFT HEART CATH AND CORONARY ANGIOGRAPHY;  Surgeon: Dolores Patty, MD;  Location: [facility];  Service: Cardiovascular;  Laterality: N/A;   ROBOTIC ASSITED PARTIAL NEPHRECTOMY Left 01/07/2023   Procedure: XI ROBOTIC ASSITED LEFT PARTIAL NEPHRECTOMY;  Surgeon: Crist Fat, MD;  Location: [facility];  Service: Urology;  Laterality: Left;  210 MINUTES   Social History:  reports that he quit smoking about 17 months ago. His smoking use included cigarettes. He has never used smokeless tobacco. He reports that he does not currently use alcohol. He reports that he does not use drugs.  No Known Allergies  Family History  Problem Relation Age of Onset   Hypertension Brother    Heart failure Neg Hx     Prior to Admission medications   Medication Sig Start Date End Date Taking? Authorizing Provider  aspirin EC  81 MG tablet Take 1 tablet (81 mg total) by mouth daily. Swallow whole. 02/05/22  Yes Andrey Farmer, PA-C  empagliflozin (JARDIANCE) 10 MG TABS tablet Take 1 tablet (10 mg total) by mouth  daily. 02/05/22  Yes Andrey Farmer, PA-C  metoprolol succinate (TOPROL XL) 50 MG 24 hr tablet Take 1 tablet (50 mg total) by mouth at bedtime. 04/15/22  Yes Bensimhon, Bevelyn Buckles, MD  Multiple Vitamin (MULTIVITAMIN WITH MINERALS) TABS tablet Take 1 tablet by mouth daily.   Yes [provider]  rivaroxaban (XARELTO) 20 MG TABS tablet Take 1 tablet (20 mg total) by mouth daily with supper. 02/05/22  Yes Andrey Farmer, PA-C  sacubitril-valsartan (ENTRESTO) 97-103 MG Take 1 tablet by mouth 2 (two) times daily. 03/19/22  Yes Milford, Anderson Malta, FNP  spironolactone (ALDACTONE) 25 MG tablet TAKE 1/2 TABLET BY MOUTH EVERY DAY 11/25/22  Yes Andrey Farmer, PA-C  torsemide (DEMADEX) 20 MG tablet Take 1 tablet (20 mg total) by mouth daily as needed (fluid retention). 02/05/22  Yes Andrey Farmer, PA-C  traMADol (ULTRAM) 50 MG tablet Take 1-2 tablets (50-100 mg total) by mouth every 6 (six) hours as needed for moderate pain. 01/07/23  Yes Crist Fat, MD  atorvastatin (LIPITOR) 80 MG tablet Take 1 tablet (80 mg total) by mouth daily. Patient not taking: Reported on 12/29/2022 02/05/22   Andrey Farmer, PA-C    Physical Exam: Vitals:   01/11/23 2310 01/12/23 0022 01/12/23 0300 01/12/23 0700  BP: 114/76  115/78 110/70  Pulse: 82  80 70  Resp: (!) 22  (!) 22 (!) 22  Temp:      TempSrc:      SpO2: 94% 92% 98% 94%  Weight:      Height:       Physical Exam Vitals and nursing note reviewed.  Constitutional:      General: He is awake. He is not in acute distress.    Appearance: Normal appearance.  HENT:     Head: Normocephalic.     Mouth/Throat:     Mouth: Mucous membranes are moist.  Eyes:     General: No scleral icterus.    Pupils: Pupils are equal, round, and reactive to light.  Neck:     Vascular: No JVD.  Cardiovascular:     Rate and Rhythm: Normal rate and regular rhythm.     Heart sounds: S1 normal and S2 normal.     Comments: The patient had  bilateral lower extremities compression hoses on. Pulmonary:     Effort: Pulmonary effort is normal.     Breath sounds: Rales present.  Abdominal:     General: Bowel sounds are normal.     Palpations: Abdomen is soft.  Musculoskeletal:     Cervical back: Neck supple.     Right lower leg: No edema.     Left lower leg: No edema.  Skin:    General: Skin is warm and dry.  Neurological:     General: No focal deficit present.     Mental Status: He is alert and oriented to person, place, and time.  Psychiatric:        Mood and Affect: Mood normal.        Behavior: Behavior normal. Behavior is cooperative.     Data Reviewed:   Results are pending, will review when available.   07/02/2022 TTE. IMPRESSIONS    1. The left ventricle demonstrates regional wall motion abnormalities  (Apical infarct without  LV thrombus, evidence of LAD and RCA infarct, LVEF  25-30%). There is moderate concentric left ventricular hypertrophy. Left  ventricular diastolic parameters  are consistent with Grade I diastolic dysfunction (impaired relaxation).   2. Right ventricular systolic function is normal. The right ventricular  size is normal. There is normal pulmonary artery systolic pressure.   3. Left atrial size was mildly dilated.   4. The mitral valve is normal in structure. Mild mitral valve  regurgitation. No evidence of mitral stenosis.   5. Tricuspid valve regurgitation is mild to moderate.   6. The aortic valve is tricuspid. Aortic valve regurgitation is not  visualized.   7. Aortic dilatation noted. There is mild dilatation of the ascending  aorta, measuring 41 mm.   8. The inferior vena cava is normal in size with greater than 50%  respiratory variability, suggesting right atrial pressure of 3 mmHg.    Family Communication:  Primary team communication:  Thank you very much for involving [place] in the care of your patient.  Author: Bobette Mo, MD 01/12/2023 9:03 AM  For on call  review [email or web address].   This document was prepared using Dragon voice recognition software and may contain some unintended transcription errors.

## 2023-01-12 NOTE — TOC Initial Note (Signed)
Transition of Care [facility]) - Initial/Assessment Note    Patient Details  Name: Troy May MRN: [id number] Date of Birth: Sep 25, 1950  Transition of [facility]) CM/SW Contact:    Howell Rucks, RN Phone Number: 01/12/2023, 11:18 AM  Clinical Narrative:   [facility] consult for Heart  Failure and SNF. Heart Failure consult already ordered. Chat to attending to place order for PT eval, awaiting PT recommendation.                    Patient Goals and CMS Choice            Expected Discharge Plan and Services                                              Prior Living Arrangements/Services                       Activities of Daily Living Home Assistive Devices/Equipment: None ADL Screening (condition at time of admission) Patient's cognitive ability adequate to safely complete daily activities?: Yes Is the patient deaf or have difficulty hearing?: No Does the patient have difficulty seeing, even when wearing glasses/contacts?: No Does the patient have difficulty concentrating, remembering, or making decisions?: No Patient able to express need for assistance with ADLs?: Yes Does the patient have difficulty dressing or bathing?: No Independently performs ADLs?: Yes (appropriate for developmental age) Does the patient have difficulty walking or climbing stairs?: No Weakness of Legs: None Weakness of Arms/Hands: None  Permission Sought/Granted                  Emotional Assessment              Admission diagnosis:  Renal mass [N28.89] Left renal mass [N28.89] Patient Active Problem List   Diagnosis Date Noted   Type 2 diabetes mellitus (HCC) 01/12/2023   Renal mass 01/07/2023   Left renal mass 01/07/2023   Dyslipidemia 08/31/2022   Coronary artery disease of native artery of native heart with stable angina pectoris (HCC) 02/08/2022   Chronic systolic heart failure (HCC) 08/05/2021   NSTEMI (non-ST elevated myocardial infarction) (HCC) 07/25/2021    Essential hypertension 07/25/2021   AKI (acute kidney injury) (HCC) 07/25/2021   Acute combined systolic and diastolic heart failure (HCC)    Left ventricular apical thrombus    PCP:  Georgina Quint, MD Pharmacy:   [id number] - [place], [place] - [address] [place] [address] Phone: [phone] Fax: [phone]  [facility] [address] [place] [address] Phone: [phone] Fax: [phone]  [facility] [address] [place] [address] Phone: [phone] Fax: [phone]  [facility] [place], [place] - [address] [place] [address] Phone: [phone] Fax: [phone]     Social Determinants of Health (SDOH) Social History: SDOH Screenings   Food Insecurity: No Food Insecurity (01/07/2023)  Housing: Low Risk  (01/07/2023)  Transportation Needs: No Transportation Needs (01/07/2023)  Utilities: Not At Risk (01/07/2023)  Alcohol Screen: Low Risk  (09/03/2022)  Depression (PHQ2-9): Low Risk  (09/03/2022)  Financial Resource Strain: Low Risk  (09/03/2022)  Physical Activity: Sufficiently Active (09/03/2022)  Social Connections: Socially Integrated (09/03/2022)  Stress: No Stress Concern Present (09/03/2022)  Tobacco  Use: Medium Risk (01/08/2023)   SDOH Interventions:     Readmission Risk Interventions     No data to display

## 2023-01-12 NOTE — Progress Notes (Signed)
Pt ambulated 60ft in hallway and dropped as low as 76% on 4L. After resting in char saturations improved back to 92%. Primary RN made aware.

## 2023-01-13 ENCOUNTER — Inpatient Hospital Stay (HOSPITAL_COMMUNITY): Payer: Medicare Other

## 2023-01-13 DIAGNOSIS — N2889 Other specified disorders of kidney and ureter: Secondary | ICD-10-CM | POA: Diagnosis not present

## 2023-01-13 LAB — BASIC METABOLIC PANEL WITH GFR
Anion gap: 10 (ref 5–15)
BUN: 31 mg/dL — ABNORMAL HIGH (ref 8–23)
CO2: 22 mmol/L (ref 22–32)
Calcium: 8 mg/dL — ABNORMAL LOW (ref 8.9–10.3)
Chloride: 98 mmol/L (ref 98–111)
Creatinine, Ser: 2.69 mg/dL — ABNORMAL HIGH (ref 0.61–1.24)
GFR, Estimated: 25 mL/min — ABNORMAL LOW (ref >60)
Glucose, Bld: 111 mg/dL — ABNORMAL HIGH (ref 70–99)
Potassium: 4.1 mmol/L (ref 3.5–5.1)
Sodium: 130 mmol/L — ABNORMAL LOW (ref 135–145)

## 2023-01-13 SURGERY — Surgical Case
Anesthesia: *Unknown

## 2023-01-13 MED ORDER — HEPARIN (PORCINE) 25000 UT/250ML-% IV SOLN
1550.0000 [IU]/h | INTRAVENOUS | Status: AC
  Administered 2023-01-13: 1650 [IU]/h via INTRAVENOUS
  Administered 2023-01-14 – 2023-01-16 (×4): 1550 [IU]/h via INTRAVENOUS
  Filled 2023-01-13 (×5): qty 250

## 2023-01-13 MED ORDER — ASPIRIN 81 MG PO TBEC
81.0000 mg | DELAYED_RELEASE_TABLET | Freq: Every day | ORAL | Status: DC
  Administered 2023-01-13: 81 mg via ORAL
  Filled 2023-01-13: qty 1

## 2023-01-13 MED ORDER — SPIRONOLACTONE 12.5 MG HALF TABLET
12.5000 mg | ORAL_TABLET | Freq: Every day | ORAL | Status: DC
  Filled 2023-01-13: qty 1

## 2023-01-13 MED ORDER — TECHNETIUM TO 99M ALBUMIN AGGREGATED
4.2400 | Freq: Once | INTRAVENOUS | Status: AC | PRN
  Administered 2023-01-13: 4.24 via INTRAVENOUS

## 2023-01-13 MED ORDER — HEPARIN BOLUS VIA INFUSION
6000.0000 [IU] | INTRAVENOUS | Status: AC
  Administered 2023-01-13: 6000 [IU] via INTRAVENOUS
  Filled 2023-01-13: qty 6000

## 2023-01-13 MED ORDER — EMPAGLIFLOZIN 10 MG PO TABS: 10.0000 mg | ORAL_TABLET | Freq: Every day | ORAL | Status: DC

## 2023-01-13 NOTE — Progress Notes (Signed)
ANTICOAGULATION CONSULT NOTE - Initial Consult  Pharmacy Consult for Heparin Indication:  new PE  No Known Allergies  Patient Measurements: Height: 5\' 10"  (177.8 cm) Weight: 95.7 kg (211 lb) IBW/kg (Calculated) : 73 Heparin Dosing Weight:  92.4 kg  Vital Signs: Temp: 98.3 F (36.8 C) (05/16 1522) Temp Source: Oral (05/16 1522) BP: 152/81 (05/16 1522) Pulse Rate: 77 (05/16 1526)  Labs: Recent Labs    01/11/23 0819 01/12/23 0801 01/13/23 0415  HGB 8.5* 8.5*  --   HCT 27.2* 27.3*  --   PLT 100* 120*  --   CREATININE 1.80* 2.24* 2.69*    Estimated Creatinine Clearance: 29.2 mL/min (A) (by C-G formula based on SCr of 2.69 mg/dL (H)).   Medical History: Past Medical History:  Diagnosis Date   Ascending aortic aneurysm (HCC)    Bilateral renal masses    CAD (coronary artery disease)    CHF (congestive heart failure) (HCC)    HLD (hyperlipidemia)    Hypertension    LV (left ventricular) mural thrombus    Systolic heart failure (HCC)     Assessment: AC/Heme: PTA Xarelto for LV mural thrombus held (LD 5/6). Urology Surgery 5/10 -SCDs ordered post-op 5/11: Left upper port site oozing sanguineous drainage. JP site dressing saturated. Hgb drop to 10.5. 5/12: Hgb down to 8.5, Plts 179>89? Dressings dry today 5/13: Hgb 8.2 > 9.2, drain output slowed significantly  5/14: Hgb down to 8.5, Plt low but improved to 100 5/15: Hgb stable at 8.5. Plt low but improved to 120. MD started SQ heparin q12h 5/16: VQ scan: New BL PE. No CBC done today  Goal of Therapy:  Heparin level 0.3-0.7 units/ml Monitor platelets by anticoagulation protocol: Yes   Plan:  D/c SQ heparin  Heparin 6000 unit IV bolus Heparin infusion at 1650 units/hr Check heparin level 8 hrs after heparin starts Daily HL and CBC   Malone Vanblarcom S. Merilynn Finland, PharmD, BCPS Clinical Staff Pharmacist [email or web address] Misty Stanley Stillinger 01/13/2023,4:24 PM

## 2023-01-13 NOTE — Progress Notes (Addendum)
Rounding Note    Patient Name: Troy May Date of Encounter: 01/13/2023  [facility] Cardiologist: Dr. Gala Romney  Subjective   No acute overnight evens. Patient had a great urinary output after last night dose of Lasix and he does feel like he is breathing a little better today. However, he still has dyspnea when he is walking longer distances which he was not having at home. His O2 sat dropped to 76% on 4L of nasal cannula yesterday afternoon when ambulating 50 ft in the hallway but improved to 92% with rest. However, this was before he got the additional dose of Lasix. He also feels like his abdomen is softer today. He denies any chest pain.  Inpatient Medications    Scheduled Meds:  aspirin EC  81 mg Oral Daily   docusate sodium  100 mg Oral BID   empagliflozin  10 mg Oral Daily   heparin injection (subcutaneous)  5,000 Units Subcutaneous Q12H   metoprolol succinate  50 mg Oral QHS   Continuous Infusions:  PRN Meds: diphenhydrAMINE **OR** diphenhydrAMINE, traMADol   Vital Signs    Vitals:   01/12/23 2208 01/13/23 0434 01/13/23 0500 01/13/23 0640  BP:    (!) 152/89  Pulse:    77  Resp:    (!) 22  Temp: 98.7 F (37.1 C) 98.5 F (36.9 C)    TempSrc: Oral Oral    SpO2:    94%  Weight:   95.7 kg   Height:        Intake/Output Summary (Last 24 hours) at 01/13/2023 0829 Last data filed at 01/13/2023 0816 Gross per 24 hour  Intake 480 ml  Output 2765 ml  Net -2285 ml      01/13/2023    5:00 AM 01/07/2023    2:59 PM 01/07/2023    6:06 AM  Last 3 Weights  Weight (lbs) 211 lb 204 lb 12.9 oz 202 lb 13.2 oz  Weight (kg) 95.709 kg 92.9 kg 92 kg      Telemetry    Normal sinus rhythm with occasional PVCs/ ventricular couplets. Rates in the 80s to 90s. - Personally Reviewed  ECG    No new ECG tracing. - Personally Reviewed  Physical Exam   GEN: 72 year old [place]-American male resting comfortably in no acute distress. On 5L of O2 via [place]. Neck: No  JVD. Cardiac: RRR. No murmurs, rubs, or gallops.  Respiratory: On 5L of O2 via [place]. No increased work of breathing. Diminished breath sounds in bilateral bases but no significant wheezes, rhonchi, or rales.  GI: Soft and distended/ obese. JP drain in place. MS: Trace lower extremity edema bilaterally. Skin: Warm and dry. Neuro:  No focal deficits. Psych: Normal affect. Responds appropriately.   Labs    High Sensitivity Troponin:  No results for input(s): "TROPONINIHS" in the last 720 hours.   Chemistry Recent Labs  Lab 01/11/23 0819 01/12/23 0801 01/13/23 0415  NA 130* 131* 130*  K 3.9 3.9 4.1  CL 103 101 98  CO2 19* 21* 22  GLUCOSE 136* 114* 111*  BUN 21 30* 31*  CREATININE 1.80* 2.24* 2.69*  CALCIUM 7.8* 8.0* 8.0*  GFRNONAA 40* 31* 25*  ANIONGAP 8 9 10     Lipids No results for input(s): "CHOL", "TRIG", "HDL", "LABVLDL", "LDLCALC", "CHOLHDL" in the last 168 hours.  Hematology Recent Labs  Lab 01/09/23 0709 01/09/23 1555 01/10/23 1128 01/11/23 0819 01/12/23 0801  WBC 7.8  --   --  7.5 7.8  RBC 3.05*  --   --  3.01* 3.02*  HGB 8.8*   < > 9.2* 8.5* 8.5*  HCT 27.0*   < > 29.3* 27.2* 27.3*  MCV 88.5  --   --  90.4 90.4  MCH 28.9  --   --  28.2 28.1  MCHC 32.6  --   --  31.3 31.1  RDW 16.7*  --   --  17.0* 17.1*  PLT 89*  --   --  100* 120*   < > = values in this interval not displayed.   Thyroid No results for input(s): "TSH", "FREET4" in the last 168 hours.  BNP Recent Labs  Lab 01/12/23 0801  BNP 1,837.4*    DDimer No results for input(s): "DDIMER" in the last 168 hours.   Radiology    DG CHEST PORT 1 VIEW  Result Date: 01/12/2023 CLINICAL DATA:  Shortness of breath. EXAM: PORTABLE CHEST 1 VIEW COMPARISON:  01/11/2023 FINDINGS: Normal heart size. Similar appearance of small left pleural effusion. No interstitial edema or airspace disease. Visualized osseous structures are unremarkable. IMPRESSION: Persistent small left pleural effusion. Electronically Signed    By: Signa Kell M.D.   On: 01/12/2023 07:59   DG Chest 2 View  Result Date: 01/11/2023 CLINICAL DATA:  Shortness of breath. History of CHF and hypertension. EXAM: CHEST - 2 VIEW COMPARISON:  07/24/2021 FINDINGS: Cardiac enlargement. No vascular congestion or edema. Small left pleural effusion with basilar atelectasis. No pneumothorax. Mediastinal contours appear intact. Degenerative changes in the spine and shoulders. IMPRESSION: 1. Mild cardiac enlargement. 2. Small left pleural effusion with basilar atelectasis. Electronically Signed   By: Burman Nieves M.D.   On: 01/11/2023 15:29    Cardiac Studies   Right/ Left Cardiac Catheterization 07/27/2021:   Ost RCA to Prox RCA lesion is 100% stenosed.   Prox Cx to Mid Cx lesion is 30% stenosed.   1st Mrg lesion is 20% stenosed.   Prox LAD lesion is 60% stenosed.   Mid LAD lesion is 90% stenosed.   Mid LAD to Dist LAD lesion is 100% stenosed.   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 Continue diuresis. Titrate GDMT. Place PICC. Will need cMRI to assess viability of LAD territory but suspect minimal viability based on echo.    Will discuss management of RCC with IR with possible embolization.    Diagnostic Dominance: Right  _______________   Cardiac MRI 07/28/2021: Impression: Severe LV dysfunction. LAD and RCA territories with scar due not appear viable.   Large LV thrombus. _______________   Echocardiogram 07/02/2022: Impressions:  1. The left ventricle demonstrates regional wall motion abnormalities  (Apical infarct without LV thrombus, evidence of LAD and RCA infarct, LVEF  25-30%). There is moderate concentric left  ventricular hypertrophy. Left  ventricular diastolic parameters  are consistent with Grade I diastolic dysfunction (impaired relaxation).   2. Right ventricular systolic function is normal. The right ventricular  size is normal. There is normal pulmonary artery systolic pressure.   3. Left atrial size was mildly dilated.   4. The mitral valve is normal in structure. Mild  mitral valve  regurgitation. No evidence of mitral stenosis.   5. Tricuspid valve regurgitation is mild to moderate.   6. The aortic valve is tricuspid. Aortic valve regurgitation is not  visualized.   7. Aortic dilatation noted. There is mild dilatation of the ascending  aorta, measuring 41 mm.   8. The inferior vena cava is normal in size with greater than 50%  respiratory variability, suggesting right atrial pressure of 3 mmHg.   Comparison(s): Resolution of apical thrombus; study done with contrast.   Patient Profile     72 y.o. male with a history of severe 2-vessel CAD with CTO of ostial to proximal RCA and mid to distal LAD on cardiac catheterization in 06/2021 (areas not viable on cardiac MRI), ischemic cardiomyopathy with biventricular CHF and LVEF of 25-30% in 06/2022, LV thrombus on Echo in 06/2021 (resolved on last Echo in 06/2022 but remains on Xraelto), dilatated ascending thoracic aorta,  hypertension, hyperlipidemia, CKD stage III, and bilateral renal masses suspicious for renal cell carcinoma  who is being seen 01/12/2023 for the evaluation of acute on chronic CHF at the request of Dr. Marlou Porch.   Assessment & Plan    Acute Hypoxic Respiratory Failure Acute on Chronic BiVentricular CHF Ischemic Cardiomyopathy Patient has a history of ischemic cardiomyopathy and biventricular CHF. Initially diagnosed in 06/2021.  Last Echo in 06/2022 showed LVEF of 25-30% with persistent wall motion abnormalities and grade 1 diastolic dysfunction. RV normal. He has been doing well from a CHF standpoint prior to this  admission. He was admitted for a planned partial left nephrectomy and has required supplemental O2 since discharge. Currently on 4L of high flow nasal cannula (not on any O2 at home). Chest x-ray showed small left pleural effusion with basilar atelectasis. Suspect this is due to IV fluids peri-operatively. He received a couple of doses of IV Lasix with good urinary output. Documented urinary output of 2.6 L yesterday and net negative 1 L this admission. However, creatinine continues to climb and is 2.69 today.  - Volume status appears improved today. Does not appear to be significantly volume overloaded but is still requiring 4-5L of O2 via nasal cannula. - Will hold off on additional diuretics for now given bump in creatinine and review with MD. - Home Entresto and Spironolactone were held on admission. Continue to hold for now. - Continue Toprol-XL 50mg  daily. - Continue Jardiance 10mg  daily.  - Continue to monitor daily weight, strict I/Os, and renal function.   CAD History of severe 2 vessel CAD with CTO of RCA and LAD on cardiac catheterization in 06/2021. Cardiac MRI at that time showed scar in RCA and LAD territories with no viability.  - No chest pain.  - Continue aspirin, beta-blocker, and statin.   LV Thrombus Noted on Echo in 06/2021. Last Echo in 06/2022 showed complete resolution of thrombus but he has remained on Xarelto given reduced EF. - Restart Xarelto when okay with Urology. Will need reduced dosing of 15mg  daily given CrCl <51%.   Hypertension BP mildly elevated this morning. Most recent BP 146/100 but no medications have been given yet.. - Currently on Toprol-XL 50mg  daily. Consider switching to Coreg for better BP control. - Home Entresto and Spironolactone on hold given worsening renal function as above.   Hyperlipidemia - Continue Lipitor 80mg  daily.   CKD Stage III Creatinine 1.38 on admission Baseline around 1.4 to 1.6. Creatinine has fluctuated this admission  after partial left nephrectomy.  - Creatinine continue to trend  up with diuresis: 1.8 >> 2.24 >> 2.69. - Will hold additional diuretics for now as above. - Continue to monitor closely.    Otherwise, per primary team:  For questions or updates, please contact [facility] Please consult [email or web address] for contact info under        Signed, Corrin Parker, PA-C  01/13/2023, 8:29 AM    I have seen and examined the patient along with Corrin Parker, PA-C.  I have reviewed the chart, notes and new data.  I agree with PA/NP's note.  Key new complaints: he feels better Key examination changes: a few crackles of atelectasis in both lung bases, no moist rales, no JVD, improved edema. RRR. According to in/out he has negative balance, but if today's weight of 211 lb is accurate, that is still 3-8 lb more than outpatient weights recorded this year. Key new findings / data: creatinine has increased further  PLAN: No diuretics today. Encourage incentive spirometry. Check lung perfusion scan. Resume Xarelto 15 mg daily if OK w Urology. Restart entresto and spiro when rnal parameters show an improving trend.  Thurmon Fair, MD, [facility] [phone] 01/13/2023, 1:29 PM

## 2023-01-13 NOTE — Progress Notes (Addendum)
Consult NOTE    Troy May  ZOX:[id number] DOB: 08/16/1951 DOA: 01/07/2023 PCP: Troy Quint, MD  No chief complaint on May.   Brief Narrative:   Troy May is Troy May 72 y.o. male with past medical history of left ventricle mural thrombus, ascending aortic aneurysm, chronic combined systolic and diastolic heart failure, CAD, bilateral renal masses, hyperlipidemia who we are being consulted for dyspnea and new oxygen requirement. He denied fever, chills, rhinorrhea, sore throat, wheezing or hemoptysis.  No chest pain, palpitations, diaphoresis, PND, passout positive orthopnea, lower extremities and bilateral hands edema.  No nausea, emesis, diarrhea, constipation, melena or hematochezia.  No flank pain or dysuria.  No polyuria, polydipsia, polyphagia or blurred vision.   Assessment & Plan:   Principal Problem:   Renal mass Active Problems:   Essential hypertension   AKI (acute kidney injury) (HCC)   Acute combined systolic and diastolic heart failure (HCC)   Left ventricular apical thrombus   Coronary artery disease of native artery of native heart with stable angina pectoris (HCC)   Dyslipidemia   Left renal mass   Type 2 diabetes mellitus (HCC)   Biventricular heart failure (HCC)   Paroxysmal atrial fibrillation (HCC)   Acute on chronic combined systolic and diastolic CHF (congestive heart failure) (HCC)  Addendum 4:36 PM Acute Hypoxic Respiratory Failure Due to presumed HF Monitor, may need additional w/u - volume seems improved, but needing more O2 than baseline Wean o2 as tolerated  4:36 PM started on heparin gtt for PE diagnosed on VQ scan, follow LE [place], echo as well   Renal mass Left renal mass Continue postop management per primary team.   Acute on chronic combined systolic and diastolic heart failure (HCC) Echo with EF 25-30%, grade 1 diastolic dysfunction Seems improved from volume status Further diuretics per cards I/O, daily weights Appreciate cards  recs  AKI (acute kidney injury) (HCC) Worsening today Consider renal US Volume per cardiology   Essential hypertension Continue metoprolol succinate 50 mg p.o. daily. Entresto on hold, spironolactone on hold   Left ventricular apical thrombus Xarelto was held prior to procedure. Will discuss with urology, when ok for anticoagulation (heparin while inpatient?)   Coronary artery disease of native artery of  native heart with stable angina pectoris (HCC) On beta-blocker. Statin, aspirin and DOAC on hold.   Dyslipidemia Atorvastatin currently being held.   Type 2 diabetes mellitus (HCC)  Jardiance A1c 6    DVT prophylaxis: heparin Code Status: full Family Communication: wife at bedside Disposition:   Status is: Inpatient Remains inpatient appropriate because: worsening creatinine   Urology is Primary Cardiology consulting [facility] consulting  Procedures:  Robotic assisted laparoscopic left partial nephrectomy   Antimicrobials:  Anti-infectives (From admission, onward)    Start     Dose/Rate Route Frequency Ordered Stop   01/07/23 1630  ceFAZolin (ANCEF) IVPB 1 g/50 mL premix        1 g 100 mL/hr over 30 Minutes Intravenous Every 8 hours 01/07/23 1602 01/08/23 0038   01/07/23 0529  ceFAZolin (ANCEF) IVPB 2g/100 mL premix        2 g 200 mL/hr over 30 Minutes Intravenous 30 min pre-op 01/07/23 0529 01/07/23 0818       Subjective: No new complaints  Objective: Vitals:   01/13/23 0500 01/13/23 0640 01/13/23 1025 01/13/23 1200  BP:  (!) 152/89    Pulse:  77    Resp:  (!) 22    Temp:    98.3 F (36.8 C)  TempSrc:    Oral  SpO2:  94% (!) 85%   Weight: 95.7 kg     Height:        Intake/Output Summary (Last 24 hours) at 01/13/2023 1319 Last data filed at 01/13/2023 1010 Gross per 24 hour  Intake --  Output 2595 ml  Net -2595 ml   Filed Weights   01/07/23 0606 01/07/23 1459 01/13/23 0500  Weight: 92 kg 92.9 kg 95.7 kg    Examination:  General exam:  Appears calm and comfortable  Respiratory system: Clear to auscultation. Respiratory effort normal. Cardiovascular system: RRR Gastrointestinal system: Abdomen is nondistended, soft and nontender. Central nervous system: Alert and oriented. No focal neurological deficits. Extremities: trace LE edema   Data Reviewed: I have personally reviewed following labs and imaging studies  CBC: Recent Labs  Lab 01/09/23 0709 01/09/23 1555 01/10/23 0348 01/10/23 1128 01/11/23 0819 01/12/23 0801  WBC 7.8  --   --   --  7.5 7.8  NEUTROABS  --   --   --   --   --  5.4  HGB 8.8* 8.7* 8.2* 9.2* 8.5* 8.5*  HCT 27.0* 27.3* 26.1* 29.3* 27.2* 27.3*  MCV 88.5  --   --   --  90.4 90.4  PLT 89*  --   --   --  100* 120*    Basic Metabolic Panel: Recent Labs  Lab 01/09/23 0403 01/10/23 0348 01/11/23 0819 01/12/23 0801 01/13/23 0415  NA 131* 133* 130* 131* 130*  K 4.7 4.6 3.9 3.9 4.1  CL 105 106 103 101 98  CO2 17* 21* 19* 21* 22  GLUCOSE 107* 116* 136* 114* 111*  BUN 18 17 21  30* 31*  CREATININE 2.12* 1.80* 1.80* 2.24* 2.69*  CALCIUM 7.5* 7.6* 7.8* 8.0* 8.0*    GFR: Estimated Creatinine Clearance: 29.2 mL/min (Troy May) (by C-G formula based on SCr of 2.69 mg/dL (H)).  Liver Function Tests: No results for input(s): "AST", "ALT", "ALKPHOS", "BILITOT", "PROT", "ALBUMIN" in the last 168 hours.  CBG: Recent Labs  Lab 01/11/23 1701 01/11/23 2034 01/11/23 2359 01/12/23 0421 01/12/23 0745  GLUCAP 134* 148* 108* 109* 110*     No results found for this or any previous visit (from the past 240 hour(s)).       Radiology Studies: DG CHEST PORT 1 VIEW  Result Date: 01/12/2023 CLINICAL DATA:  Shortness of breath. EXAM: PORTABLE CHEST 1 VIEW COMPARISON:  01/11/2023 FINDINGS: Normal heart size. Similar appearance of small left pleural effusion. No interstitial edema or airspace disease. Visualized osseous structures are unremarkable. IMPRESSION: Persistent small left pleural effusion.  Electronically Signed   By: Signa Kell M.D.   On: 01/12/2023 07:59        Scheduled Meds:  aspirin EC  81 mg Oral Daily   docusate sodium  100 mg Oral BID   empagliflozin  10 mg Oral Daily   heparin injection (subcutaneous)  5,000 Units Subcutaneous Q12H   metoprolol succinate  50 mg Oral QHS   Continuous Infusions:   LOS: 6 days    Time spent: over 30 min    Lacretia Nicks, MD [facility]   To contact the attending provider between 7A-7P or the covering provider during after hours 7P-7A, please log into the web site [email or web address] and access using universal [place] password for that web site. If you do not have the password, please call the hospital operator.  01/13/2023, 1:19 PM

## 2023-01-13 NOTE — Progress Notes (Signed)
6 Days Post-Op Subjective: Seen by hospitalist and cardiology No acute events Given extra dose of lasix in the PM Restarted SQH No complaints this AM - except for urinary frequency (had one void he described as pink or blood tinged)  Objective: Vital signs in last 24 hours: Temp:  [98.2 F (36.8 C)-99.8 F (37.7 C)] 98.5 F (36.9 C) (05/16 0434) Pulse Rate:  [77-91] 77 (05/16 0640) Resp:  [22-23] 22 (05/16 0640) BP: (127-169)/(79-101) 152/89 (05/16 0640) SpO2:  [90 %-94 %] 94 % (05/16 0640) Weight:  [95.7 kg] 95.7 kg (05/16 0500)  Intake/Output from previous day: 05/15 0701 - 05/16 0700 In: 480 [P.O.:480] Out: 2665 [Urine:2600; Drains:65] Intake/Output this shift: I/O last 3 completed shifts: In: 817 [P.O.:717; IV Piggyback:100] Out: 3768 [Urine:3655; Drains:113]   Physical Exam:  General: Alert and oriented CV: RRR Lungs: Diminished in bases w/ atelectasis   Abdomen: Soft, ND. JP with s/s output Incisions: c/d/I GU: voiding spontaneously Ext: NT, No erythema, resolved pitting edema  Lab Results: Recent Labs    01/10/23 1128 01/11/23 0819 01/12/23 0801  HGB 9.2* 8.5* 8.5*  HCT 29.3* 27.2* 27.3*    BMET Recent Labs    01/12/23 0801 01/13/23 0415  NA 131* 130*  K 3.9 4.1  CL 101 98  CO2 21* 22  GLUCOSE 114* 111*  BUN 30* 31*  CREATININE 2.24* 2.69*  CALCIUM 8.0* 8.0*      Studies/Results: DG CHEST PORT 1 VIEW  Result Date: 01/12/2023 CLINICAL DATA:  Shortness of breath. EXAM: PORTABLE CHEST 1 VIEW COMPARISON:  01/11/2023 FINDINGS: Normal heart size. Similar appearance of small left pleural effusion. No interstitial edema or airspace disease. Visualized osseous structures are unremarkable. IMPRESSION: Persistent small left pleural effusion. Electronically Signed   By: Signa Kell M.D.   On: 01/12/2023 07:59   DG Chest 2 View  Result Date: 01/11/2023 CLINICAL DATA:  Shortness of breath. History of CHF and hypertension. EXAM: CHEST - 2 VIEW  COMPARISON:  07/24/2021 FINDINGS: Cardiac enlargement. No vascular congestion or edema. Small left pleural effusion with basilar atelectasis. No pneumothorax. Mediastinal contours appear intact. Degenerative changes in the spine and shoulders. IMPRESSION: 1. Mild cardiac enlargement. 2. Small left pleural effusion with basilar atelectasis. Electronically Signed   By: Burman Nieves M.D.   On: 01/11/2023 15:29    Assessment/Plan: #Left renal mass s/p partial Nx -  d/c JP drain - Creatinine continuing to improve - Hgb stable - medlock - Regular diet (heart healthy) - Schedule IV tylenol  #Persistent O2 requirement - suspect CHF as ideology of persistent O2 requirement, renal function worsening, appears to me to be more Euvolemic - will hold any additional extra doses of diuretic.   Appreciate Hospitalist and cardiology input Restarted ASA this AM, will get back on Xarelto at time of discharge Restarted spironalactone and jardiance - okay to restart Entresto maybe at a lower dose (renal dosing) wean O2 as tolerated, Incentive spirometery, Ambulate May need to go home with home O2 if unable to wean oxygen.  Dispo dependent on cardiac eval, if no further w/u recommended at this time, d/c w/ home O2, w/ close f/u for re-eval.   LOS: 6 days   Crist Fat 01/13/2023, 7:44 AM

## 2023-01-13 NOTE — Progress Notes (Signed)
Mobility Specialist - Progress Note   01/13/23 1025  Oxygen Therapy  SpO2 (!) 85 %  O2 Device Nasal Cannula  Patient Activity (if Appropriate) Ambulating  Mobility  Activity Ambulated with assistance in hallway  Level of Assistance Modified independent, requires aide device or extra time  Assistive Device Front wheel walker  Distance Ambulated (ft) 200 ft  Activity Response Tolerated well  Mobility Referral Yes  $Mobility charge 1 Mobility  Mobility Specialist Start Time (ACUTE ONLY) 1017  Mobility Specialist Stop Time (ACUTE ONLY) 1024  Mobility Specialist Time Calculation (min) (ACUTE ONLY) 7 min   Pt received in hallway with NT and agreeable to mobility. Pt desat to 85%, encouraged pursed lip breaths allowing O2 to come back up to 89%. No complaints during session. Pt to recliner after session with all needs met & NT in room.   Pre-mobility: 102 HR, 85%-88% SpO2 During mobility: 92% SpO2 Post-mobility: 109 HR, 92% SPO2  Chief Technology Officer

## 2023-01-13 NOTE — Progress Notes (Signed)
Pt O2 dropped to 77% twice while sleeping. Put patient back on 4 L O2 to keep it up.

## 2023-01-13 NOTE — Plan of Care (Signed)
  Problem: Education: Goal: Knowledge of the prescribed therapeutic regimen will improve Outcome: Adequate for Discharge   Problem: Clinical Measurements: Goal: Postoperative complications will be avoided or minimized Outcome: Adequate for Discharge   Problem: Respiratory: Goal: Ability to achieve and maintain a regular respiratory rate will improve Outcome: Adequate for Discharge   Problem: Skin Integrity: Goal: Demonstration of wound healing without infection will improve Outcome: Adequate for Discharge   Problem: Health Behavior/Discharge Planning: Goal: Ability to manage health-related needs will improve Outcome: Adequate for Discharge   Problem: Clinical Measurements: Goal: Respiratory complications will improve Outcome: Adequate for Discharge   Problem: Education: Goal: Knowledge of General Education information will improve Description: Including pain rating scale, medication(s)/side effects and non-pharmacologic comfort measures Outcome: Completed/Met

## 2023-01-13 NOTE — Progress Notes (Signed)
SATURATION QUALIFICATIONS: (This note is used to comply with regulatory documentation for home oxygen)  Patient Saturations on Room Air at Rest = 97%  Patient Saturations on Room Air while Ambulating = 76%  Patient Saturations on 5 Liters of oxygen while Ambulating = 93%  Please briefly explain why patient needs home oxygen: Pt needs oxygen when ambulating.

## 2023-01-14 ENCOUNTER — Inpatient Hospital Stay (HOSPITAL_COMMUNITY): Payer: Medicare Other

## 2023-01-14 DIAGNOSIS — I236 Thrombosis of atrium, auricular appendage, and ventricle as current complications following acute myocardial infarction: Secondary | ICD-10-CM

## 2023-01-14 DIAGNOSIS — I2699 Other pulmonary embolism without acute cor pulmonale: Secondary | ICD-10-CM | POA: Diagnosis not present

## 2023-01-14 DIAGNOSIS — C649 Malignant neoplasm of unspecified kidney, except renal pelvis: Secondary | ICD-10-CM | POA: Diagnosis not present

## 2023-01-14 DIAGNOSIS — I5042 Chronic combined systolic (congestive) and diastolic (congestive) heart failure: Secondary | ICD-10-CM

## 2023-01-14 DIAGNOSIS — N179 Acute kidney failure, unspecified: Secondary | ICD-10-CM

## 2023-01-14 DIAGNOSIS — Z86711 Personal history of pulmonary embolism: Secondary | ICD-10-CM

## 2023-01-14 DIAGNOSIS — I2694 Multiple subsegmental pulmonary emboli without acute cor pulmonale: Secondary | ICD-10-CM | POA: Diagnosis not present

## 2023-01-14 DIAGNOSIS — R0609 Other forms of dyspnea: Secondary | ICD-10-CM | POA: Diagnosis not present

## 2023-01-14 DIAGNOSIS — I2510 Atherosclerotic heart disease of native coronary artery without angina pectoris: Secondary | ICD-10-CM | POA: Diagnosis not present

## 2023-01-14 LAB — ECHOCARDIOGRAM COMPLETE
AR max vel: 2.65 cm2
AV Area VTI: 2.66 cm2
AV Area mean vel: 2.43 cm2
AV Mean grad: 5 mmHg
AV Peak grad: 10.1 mmHg
Ao pk vel: 1.59 m/s
Area-P 1/2: 3.08 cm2
Calc EF: 38 %
Height: 70 in
S' Lateral: 4.1 cm
Single Plane A2C EF: 37.7 %
Single Plane A4C EF: 35.1 %
Weight: 3376 oz

## 2023-01-14 LAB — CBC
HCT: 28 % — ABNORMAL LOW (ref 39.0–52.0)
Hemoglobin: 9 g/dL — ABNORMAL LOW (ref 13.0–17.0)
MCH: 28.8 pg (ref 26.0–34.0)
MCHC: 32.1 g/dL (ref 30.0–36.0)
MCV: 89.5 fL (ref 80.0–100.0)
Platelets: 181 10*3/uL (ref 150–400)
RBC: 3.13 MIL/uL — ABNORMAL LOW (ref 4.22–5.81)
RDW: 17.1 % — ABNORMAL HIGH (ref 11.5–15.5)
WBC: 9.3 10*3/uL (ref 4.0–10.5)
nRBC: 0.5 % — ABNORMAL HIGH (ref 0.0–0.2)

## 2023-01-14 LAB — URINALYSIS, ROUTINE W REFLEX MICROSCOPIC
Bilirubin Urine: NEGATIVE
Glucose, UA: >=500 mg/dL — AB
Ketones, ur: NEGATIVE mg/dL
Leukocytes,Ua: NEGATIVE
Nitrite: NEGATIVE
Protein, ur: 100 mg/dL — AB
RBC / HPF: >50 RBC/hpf (ref 0–5)
Specific Gravity, Urine: 1.008 (ref 1.005–1.030)
pH: 5 (ref 5.0–8.0)

## 2023-01-14 LAB — BASIC METABOLIC PANEL
Anion gap: 10 (ref 5–15)
BUN: 38 mg/dL — ABNORMAL HIGH (ref 8–23)
CO2: 24 mmol/L (ref 22–32)
Calcium: 8.3 mg/dL — ABNORMAL LOW (ref 8.9–10.3)
Chloride: 100 mmol/L (ref 98–111)
Creatinine, Ser: 2.94 mg/dL — ABNORMAL HIGH (ref 0.61–1.24)
GFR, Estimated: 22 mL/min — ABNORMAL LOW (ref >60)
Glucose, Bld: 124 mg/dL — ABNORMAL HIGH (ref 70–99)
Potassium: 4.2 mmol/L (ref 3.5–5.1)
Sodium: 134 mmol/L — ABNORMAL LOW (ref 135–145)

## 2023-01-14 LAB — HEPARIN LEVEL (UNFRACTIONATED)
Heparin Unfractionated: 0.8 IU/mL — ABNORMAL HIGH (ref 0.30–0.70)
Heparin Unfractionated: 0.62 IU/mL (ref 0.30–0.70)

## 2023-01-14 MED ORDER — SODIUM CHLORIDE 0.9 % IV SOLN: INTRAVENOUS | Status: AC

## 2023-01-14 MED ORDER — PERFLUTREN LIPID MICROSPHERE
1.0000 mL | INTRAVENOUS | Status: AC | PRN
  Administered 2023-01-14: 2 mL via INTRAVENOUS

## 2023-01-14 NOTE — Progress Notes (Signed)
ANTICOAGULATION CONSULT NOTE - follow up  Pharmacy Consult for Heparin Indication:  new PE  No Known Allergies  Patient Measurements: Height: 5\' 10"  (177.8 cm) Weight: 95.7 kg (211 lb) IBW/kg (Calculated) : 73 Heparin Dosing Weight:  92.4 kg  Vital Signs: Temp: 98.9 F (37.2 C) (05/16 2100) Temp Source: Oral (05/16 2100) BP: 157/88 (05/16 2100) Pulse Rate: 76 (05/16 2100)  Labs: Recent Labs    01/11/23 0819 01/12/23 0801 01/13/23 0415 01/14/23 0116  HGB 8.5* 8.5*  --  9.0*  HCT 27.2* 27.3*  --  28.0*  PLT 100* 120*  --  181  HEPARINUNFRC  --   --   --  0.80*  CREATININE 1.80* 2.24* 2.69*  --      Estimated Creatinine Clearance: 28.8 mL/min (A) (by C-G formula based on SCr of 2.69 mg/dL (H)).   Medical History: Past Medical History:  Diagnosis Date   Ascending aortic aneurysm (HCC)    Bilateral renal masses    CAD (coronary artery disease)    CHF (congestive heart failure) (HCC)    HLD (hyperlipidemia)    Hypertension    LV (left ventricular) mural thrombus    Systolic heart failure (HCC)     Assessment: AC/Heme: PTA Xarelto for LV mural thrombus held (LD 5/6). Urology Surgery 5/10 -SCDs ordered post-op 5/11: Left upper port site oozing sanguineous drainage. JP site dressing saturated. Hgb drop to 10.5. 5/12: Hgb down to 8.5, Plts 179>89? Dressings dry today 5/13: Hgb 8.2 > 9.2, drain output slowed significantly  5/14: Hgb down to 8.5, Plt low but improved to 100 5/15: Hgb stable at 8.5. Plt low but improved to 120. MD started SQ heparin q12h 5/16: VQ scan: New BL PE. No CBC done today  01/14/2023 HL 0.8 supra-therapeutic on 1650 units/hr Hgb 9, plts now WNL No bleeding noted per RN  Goal of Therapy:  Heparin level 0.3-0.7 units/ml Monitor platelets by anticoagulation protocol: Yes   Plan:  Decrease Heparin infusion to 1550 units/hr Heparin level in 8 hours Daily HL and CBC   Arley Phenix RPh 01/14/2023, 1:53 AM

## 2023-01-14 NOTE — Care Management Important Message (Signed)
Important Message  Patient Details IM Letter given. Name: Elmar Schiek MRN: [id number] Date of Birth: 03/07/1951   Medicare Important Message Given:  Yes     Caren Macadam 01/14/2023, 1:00 PM

## 2023-01-14 NOTE — Progress Notes (Signed)
PT Cancellation Note  Patient Details Name: Troy May MRN: [id number] DOB: [birth year]   Cancelled Treatment:    Reason Eval/Treat Not Completed: Medical issues which prohibited therapy Per RN, MD requested pt only up in room, not yet ambulating long distances until 24 hrs after heparin (which will be around 1630 this afternoon).  Will check back as schedule permits.   Kati L Payson 01/14/2023, 11:00 AM Paulino Door, DPT Physical Therapist Acute Rehabilitation Services Office: [phone]

## 2023-01-14 NOTE — Progress Notes (Signed)
Urology Inpatient Progress Report  Renal mass [N28.89] Left renal mass [N28.89]  Procedure(s): XI ROBOTIC ASSITED LEFT PARTIAL NEPHRECTOMY  7 Days Post-Op   Intv/Subj: Diagnosed with bilateral PEs, started on heparin gtt Persistent O2 requirement Pain controlled No nausea No real complaints this AM   Principal Problem:   Renal cell carcinoma (HCC) Active Problems:   Essential hypertension   AKI (acute kidney injury) (HCC)   Acute combined systolic and diastolic heart failure (HCC)   Left ventricular apical thrombus   Coronary artery disease of native artery of native heart with stable angina pectoris (HCC)   Dyslipidemia   Renal mass   Type 2 diabetes mellitus (HCC)   Biventricular heart failure (HCC)   Paroxysmal atrial fibrillation (HCC)   Acute on chronic combined systolic and diastolic CHF (congestive heart failure) (HCC)   Acute pulmonary embolism (HCC)  Current Facility-Administered Medications  Medication Dose Route Frequency Provider Last Rate Last Admin   0.9 %  sodium chloride infusion   Intravenous Continuous Crist Fat, MD       docusate sodium (COLACE) capsule 100 mg  100 mg Oral BID Carlus Pavlov, MD   100 mg at 01/13/23 2115   heparin ADULT infusion 100 units/mL (25000 units/276mL)  1,550 Units/hr Intravenous Continuous Maurice March, RPH 15.5 mL/hr at 01/14/23 0440 1,550 Units/hr at 01/14/23 0440   metoprolol succinate (TOPROL-XL) 24 hr tablet 50 mg  50 mg Oral QHS Carlus Pavlov, MD   50 mg at 01/13/23 2115   traMADol (ULTRAM) tablet 50-100 mg  50-100 mg Oral Q6H PRN Crist Fat, MD   50 mg at 01/13/23 2120     Objective: Vital: Vitals:   01/13/23 1530 01/13/23 2100 01/14/23 0501 01/14/23 0600  BP:  (!) 157/88 125/72 122/82  Pulse:  76 68 68  Resp:   17 18  Temp:  98.9 F (37.2 C) 98.8 F (37.1 C)   TempSrc:  Oral Oral   SpO2: 90% 100% 98% 98%  Weight:      Height:       I/Os: I/O last 3 completed shifts: In:  562.1 [P.O.:480; I.V.:82.1] Out: 2770 [Urine:2700; Drains:70]  Physical Exam:  General: Patient is in no apparent distress Lungs: Normal respiratory effort, chest expands symmetrically.  Persistent O2 requirement (4L) GI: Incisions are c/d/i. Bruising from around incisions, JP site is dressed. The abdomen is soft and nontender without mass. Ext: lower extremities symmetric  Lab Results: Recent Labs    01/12/23 0801 01/14/23 0116  WBC 7.8 9.3  HGB 8.5* 9.0*  HCT 27.3* 28.0*   Recent Labs    01/12/23 0801 01/13/23 0415 01/14/23 0116  NA 131* 130* 134*  K 3.9 4.1 4.2  CL 101 98 100  CO2 21* 22 24  GLUCOSE 114* 111* 124*  BUN 30* 31* 38*  CREATININE 2.24* 2.69* 2.94*  CALCIUM 8.0* 8.0* 8.3*   No results for input(s): "LABPT", "INR" in the last 72 hours. No results for input(s): "LABURIN" in the last 72 hours. Results for orders placed or performed during the hospital encounter of 07/24/21  Resp Panel by RT-PCR (Flu A&B, Covid) Nasopharyngeal Swab     Status: Abnormal   Collection Time: 07/24/21  7:56 PM   Specimen: Nasopharyngeal Swab; Nasopharyngeal(NP) swabs in vial transport medium  Result Value Ref Range Status   SARS Coronavirus 2 by RT PCR NEGATIVE NEGATIVE Final    Comment: (NOTE) SARS-CoV-2 target nucleic acids are NOT DETECTED.  The SARS-CoV-2 RNA is generally  detectable in upper respiratory specimens during the acute phase of infection. The lowest concentration of SARS-CoV-2 viral copies this assay can detect is 138 copies/mL. A negative result does not preclude SARS-Cov-2 infection and should not be used as the sole basis for treatment or other patient management decisions. A negative result may occur with  improper specimen collection/handling, submission of specimen other than nasopharyngeal swab, presence of viral mutation(s) within the areas targeted by this assay, and inadequate number of viral copies(<138 copies/mL). A negative result must be combined  with clinical observations, patient history, and epidemiological information. The expected result is Negative.  Fact Sheet for Patients:  BloggerCourse.com  Fact Sheet for Healthcare Providers:  SeriousBroker.it  This test is no t yet approved or cleared by the [place] FDA and  has been authorized for detection and/or diagnosis of SARS-CoV-2 by FDA under an Emergency Use Authorization (EUA). This EUA will remain  in effect (meaning this test can be used) for the duration of the COVID-19 declaration under Section 564(b)(1) of the Act, 21 U.S.C.section 360bbb-3(b)(1), unless the authorization is terminated  or revoked sooner.       Influenza A by PCR POSITIVE (A) NEGATIVE Final   Influenza B by PCR NEGATIVE NEGATIVE Final    Comment: (NOTE) The Xpert Xpress SARS-CoV-2/FLU/RSV plus assay is intended as an aid in the diagnosis of influenza from Nasopharyngeal swab specimens and should not be used as a sole basis for treatment. Nasal washings and aspirates are unacceptable for Xpert Xpress SARS-CoV-2/FLU/RSV testing.  Fact Sheet for Patients: BloggerCourse.com  Fact Sheet for Healthcare Providers: SeriousBroker.it  This test is not yet approved or cleared by the [place] FDA and has been authorized for detection and/or diagnosis of SARS-CoV-2 by FDA under an Emergency Use Authorization (EUA). This EUA will remain in effect (meaning this test can be used) for the duration of the COVID-19 declaration under Section 564(b)(1) of the Act, 21 U.S.C. section 360bbb-3(b)(1), unless the authorization is terminated or revoked.  Performed at [facility], [address], [place], [place] [address]     Studies/Results: NM Pulmonary Perfusion  Result Date: 01/13/2023 CLINICAL DATA:  High probability for pulmonary embolism. EXAM: NUCLEAR MEDICINE PERFUSION LUNG SCAN  TECHNIQUE: Perfusion images were obtained in multiple projections after intravenous injection of radiopharmaceutical. RADIOPHARMACEUTICALS:  4.2 mCi Tc-58m MAA COMPARISON:  Chest radiograph 11/12/2022 FINDINGS: Multiple peripheral wedge-shaped perfusion defects in LEFT and RIGHT lung. Medium-size peripheral defect in the anterior LEFT lower lobe on LPO projection Smaller peripheral perfusion defect in the anterior RIGHT upper lobe and superior segment of the RIGHT lower lobe. Comparison chest x-ray is clear. IMPRESSION: Bilateral medium size and small peripheral perfusion defects consistent with acute pulmonary embolism. These results will be called to the ordering clinician or representative by the Radiologist Assistant, and communication documented in the PACS or [employer]. Electronically Signed   By: Genevive Bi M.D.   On: 01/13/2023 15:59    Assessment: Procedure(s): XI ROBOTIC ASSITED LEFT PARTIAL NEPHRECTOMY, 7 Days Post-Op  doing well. T1b left Renal cell carcinoma - margins negative, excellent prognosis Acute on chronic renal insufficiency- suspect pre-renal etiology including anemia from acute surgical blood loss, hypovolemia, and ischemia from clamping of left renal artery during surgery. B/l pulmonary emboli - persistent O2 requirement, started on heparin gtt (hgb stable), echo pending, duplex of lower extremities is pending  Plan: Gentle IV hydration, hold on any diuretics. Continue heparin gtt, transition to xarelto when appropriate  - okay from  surgical prospective PO pain medications CHF meds per Hospital service - appreciate their help w/ patient PT eval and treat Regular diet Activity ad lib  Dispo pending cardiac eval and PE management - defer this to medicine, cleared from surgical prospective.  Should be able to home with home health  Berniece Salines, MD Urology 01/14/2023, 9:18 AM

## 2023-01-14 NOTE — Progress Notes (Signed)
Progress NOTE    Prestan Brand  ZOX:[id number] DOB: 08-28-1951 DOA: 01/07/2023 PCP: Georgina Quint, MD  No chief complaint on file.   Brief Narrative:   Troy May is Troy May 72 y.o. male with past medical history of left ventricle mural thrombus, ascending aortic aneurysm, chronic combined systolic and diastolic heart failure, CAD, bilateral renal masses, hyperlipidemia who we are being consulted for dyspnea and new oxygen requirement. He denied fever, chills, rhinorrhea, sore throat, wheezing or hemoptysis.  No chest pain, palpitations, diaphoresis, PND, passout positive orthopnea, lower extremities and bilateral hands edema.  No nausea, emesis, diarrhea, constipation, melena or hematochezia.  No flank pain or dysuria.  No polyuria, polydipsia, polyphagia or blurred vision.   Significant Events 5/10 Robotic assisted left laparoscopic left partial nephrectomy 5/15 TR[facility] consulted for presumed HF.  Cardiology consulted. 5/16 VQ scan positive for PE 5/17 echo with LV thrombus.  TRH will take over as primary.  Assessment & Plan:   Principal Problem:   Renal cell carcinoma (HCC) Active Problems:   Essential hypertension   AKI (acute kidney injury) (HCC)   Acute combined systolic and diastolic heart failure (HCC)   Left ventricular apical thrombus   Coronary artery disease of native artery of native heart with stable angina pectoris (HCC)   Dyslipidemia   Renal mass   Type 2 diabetes mellitus (HCC)   Biventricular heart failure (HCC)   Paroxysmal atrial fibrillation (HCC)   Acute on chronic combined systolic and diastolic CHF (congestive heart failure) (HCC)   Acute pulmonary embolism (HCC)  Acute Hypoxic Respiratory Failure  Pulmonary Embolism  Due to PE Echo as below, mildly reduced RVSF Acute DVT involving L gastrocnemius vein, acute thrombus involving Troy May perforator vein of the calf  Currently on heparin gtt Wean o2 as tolerated  Renal mass Left renal mass Will need urology  follow up outpatient   Acute on chronic combined systolic and diastolic heart failure (HCC) Echo with EF 25-30%, grade 1 diastolic dysfunction Repeat echo with EF 25-30%, regional wall motion abnormalities with akinesis of the apex and periapical segments, grade 1 diastolic dysfunction, large globular 2x2.1 cm apical thrombus.  RVSF mildly reduced.    Appreciate cards recs - HF meds on hold with AKI  I/O, daily weights Appreciate cards recs  AKI (acute kidney injury) (HCC) Worsening today Renal US without hydro - medical renal disease - solid right renal mass measuring 5.4x5.6x4.7 Getting some gentle IVF  Strict I/O, daily weights   Essential hypertension Continue metoprolol succinate 50 mg p.o. daily. Entresto on hold, spironolactone on hold   Left ventricular apical thrombus Xarelto was held prior to procedure. Echo with large globular apical thrombus Currently on heparin gtt, appreciate cardiology recs   Coronary artery disease of native artery of  native heart with stable angina pectoris (HCC) On beta-blocker. Statin, aspirin and DOAC on hold.  Currently on heparin gtt.   Dyslipidemia Atorvastatin currently being held.   Type 2 diabetes mellitus (HCC)  Jardiance A1c 6    DVT prophylaxis: heparin Code Status: full Family Communication: wife at bedside Disposition:   Status is: Inpatient Remains inpatient appropriate because: worsening creatinine   Urology is Primary Cardiology consulting Troy May consulting  Procedures:  Robotic assisted laparoscopic left partial nephrectomy   Antimicrobials:  Anti-infectives (From admission, onward)    Start     Dose/Rate Route Frequency Ordered Stop   01/07/23 1630  ceFAZolin (ANCEF) IVPB 1 g/50 mL premix        1 g 100  mL/hr over 30 Minutes Intravenous Every 8 hours 01/07/23 1602 01/08/23 0038   01/07/23 0529  ceFAZolin (ANCEF) IVPB 2g/100 mL premix        2 g 200 mL/hr over 30 Minutes Intravenous 30 min pre-op 01/07/23  0529 01/07/23 0818       Subjective: No new complaints Daughter on phone, wife at bedside  Objective: Vitals:   01/13/23 2100 01/14/23 0501 01/14/23 0600 01/14/23 1240  BP: (!) 157/88 125/72 122/82 135/83  Pulse: 76 68 68 69  Resp:  17 18   Temp: 98.9 F (37.2 C) 98.8 F (37.1 C)  98.7 F (37.1 C)  TempSrc: Oral Oral  Oral  SpO2: 100% 98% 98% 96%  Weight:      Height:        Intake/Output Summary (Last 24 hours) at 01/14/2023 1431 Last data filed at 01/14/2023 0930 Gross per 24 hour  Intake 82.13 ml  Output 825 ml  Net -742.87 ml   Filed Weights   01/07/23 0606 01/07/23 1459 01/13/23 0500  Weight: 92 kg 92.9 kg 95.7 kg    Examination:  General: No acute distress. Cardiovascular: Heart sounds show Troy May regular rate, and rhythm.  Lungs: Clear to auscultation bilaterally - on 3 L [place]  Abdomen: Soft, nontender, nondistended  Neurological: Alert and oriented 3. Moves all extremities 4 with equal strength. Cranial nerves II through XII grossly intact. Extremities: No clubbing or cyanosis. No edema.  Data Reviewed: I have personally reviewed following labs and imaging studies  CBC: Recent Labs  Lab 01/09/23 0709 01/09/23 1555 01/10/23 0348 01/10/23 1128 01/11/23 0819 01/12/23 0801 01/14/23 0116  WBC 7.8  --   --   --  7.5 7.8 9.3  NEUTROABS  --   --   --   --   --  5.4  --   HGB 8.8*   < > 8.2* 9.2* 8.5* 8.5* 9.0*  HCT 27.0*   < > 26.1* 29.3* 27.2* 27.3* 28.0*  MCV 88.5  --   --   --  90.4 90.4 89.5  PLT 89*  --   --   --  100* 120* 181   < > = values in this interval not displayed.    Basic Metabolic Panel: Recent Labs  Lab 01/10/23 0348 01/11/23 0819 01/12/23 0801 01/13/23 0415 01/14/23 0116  NA 133* 130* 131* 130* 134*  K 4.6 3.9 3.9 4.1 4.2  CL 106 103 101 98 100  CO2 21* 19* 21* 22 24  GLUCOSE 116* 136* 114* 111* 124*  BUN 17 21 30* 31* 38*  CREATININE 1.80* 1.80* 2.24* 2.69* 2.94*  CALCIUM 7.6* 7.8* 8.0* 8.0* 8.3*    GFR: Estimated  Creatinine Clearance: 26.4 mL/min (Emmamarie Kluender) (by C-G formula based on SCr of 2.94 mg/dL (H)).  Liver Function Tests: No results for input(s): "AST", "ALT", "ALKPHOS", "BILITOT", "PROT", "ALBUMIN" in the last 168 hours.  CBG: Recent Labs  Lab 01/11/23 1701 01/11/23 2034 01/11/23 2359 01/12/23 0421 01/12/23 0745  GLUCAP 134* 148* 108* 109* 110*     No results found for this or any previous visit (from the past 240 hour(s)).       Radiology Studies: US RENAL  Result Date: 01/14/2023 CLINICAL DATA:  [id number] AKI (acute kidney injury) (HCC) [id number] EXAM: RENAL / URINARY TRACT ULTRASOUND COMPLETE COMPARISON:  MRI 11/10/2022 FINDINGS: Right Kidney: Renal measurements: 10.8 x 5.2 x 4.9 cm = volume: 145.3 mL. No hydronephrosis. Increased renal echogenicity. There is Olanda Downie solid mass measuring 5.4 x  5.6 x 4.7 cm, as seen on prior MRI. Left Kidney: Renal measurements: 12.4 x 5.6 x 4.5 cm = volume: 161.3 mL. No hydronephrosis. Increased renal cortical echogenicity. Poorly visualized due to body habitus. History of prior partial left nephrectomy. Bladder: Appears normal for degree of bladder distention. Other: None. IMPRESSION: No hydronephrosis. Increased renal cortical echogenicity bilaterally, as can be seen in medical renal disease. Solid right renal mass measuring 5.4 x 5.6 x 4.7 cm, as seen on prior MRI, compatible with renal neoplasm. Electronically Signed   By: Caprice Renshaw M.D.   On: 01/14/2023 12:03   VAS [place] LOWER EXTREMITY VENOUS (DVT)  Result Date: 01/14/2023  Lower Venous DVT Study Patient Name:  Troy May  Date of Exam:   01/14/2023 Medical Rec #: [id number]   Accession #:    [id number] Date of Birth: [birth year]   Patient Gender: M Patient Age:   54 years Exam Location:  [facility] Procedure:      VAS [place] LOWER EXTREMITY VENOUS (DVT) Referring Phys: Shayden Bobier POWELL JR --------------------------------------------------------------------------------  Indications: Pulmonary embolism.  Comparison Study:  No previous exams Performing Technologist: Jody Hill RVT, RDMS  Examination Guidelines: Troy May complete evaluation includes B-mode imaging, spectral Doppler, color Doppler, and power Doppler as needed of all accessible portions of each vessel. Bilateral testing is considered an integral part of Aliah Eriksson complete examination. Limited examinations for reoccurring indications may be performed as noted. The reflux portion of the exam is performed with the patient in reverse Trendelenburg.  +---------+---------------+---------+-----------+----------+--------------+ RIGHT    CompressibilityPhasicitySpontaneityPropertiesThrombus Aging +---------+---------------+---------+-----------+----------+--------------+ CFV      Full           Yes      Yes                                 +---------+---------------+---------+-----------+----------+--------------+ SFJ      Full                                                        +---------+---------------+---------+-----------+----------+--------------+ FV Prox  Full           Yes      Yes                                 +---------+---------------+---------+-----------+----------+--------------+ FV Mid   Full           Yes      Yes                                 +---------+---------------+---------+-----------+----------+--------------+ FV DistalFull           Yes      Yes                                 +---------+---------------+---------+-----------+----------+--------------+ PFV      Full                                                        +---------+---------------+---------+-----------+----------+--------------+  POP      Full           Yes      Yes                                 +---------+---------------+---------+-----------+----------+--------------+ PTV      Full                                                        +---------+---------------+---------+-----------+----------+--------------+ PERO     Full                                                         +---------+---------------+---------+-----------+----------+--------------+   +----------+---------------+---------+-----------+----------+--------------+ LEFT      CompressibilityPhasicitySpontaneityPropertiesThrombus Aging +----------+---------------+---------+-----------+----------+--------------+ CFV       Full           Yes      Yes                                 +----------+---------------+---------+-----------+----------+--------------+ SFJ       Full                                                        +----------+---------------+---------+-----------+----------+--------------+ FV Prox   Full           Yes      Yes                                 +----------+---------------+---------+-----------+----------+--------------+ FV Mid    Full           Yes      Yes                                 +----------+---------------+---------+-----------+----------+--------------+ FV Distal Full           Yes      Yes                                 +----------+---------------+---------+-----------+----------+--------------+ PFV       Full                                                        +----------+---------------+---------+-----------+----------+--------------+ POP       Full           Yes      Yes                                 +----------+---------------+---------+-----------+----------+--------------+  Gastroc   None           No       No                   Acute          +----------+---------------+---------+-----------+----------+--------------+ PerforatorNone           No       No                   Acute          +----------+---------------+---------+-----------+----------+--------------+    Summary: BILATERAL: -No evidence of popliteal cyst, bilaterally. RIGHT: - There is no evidence of deep vein thrombosis in the lower extremity.  LEFT: - Findings consistent with acute deep vein  thrombosis involving the left gastrocnemius veins. - Findings consistent with acute thrombosis involving Troy May perforator vein of the calf.  *See table(s) above for measurements and observations.    Preliminary    ECHOCARDIOGRAM COMPLETE  Result Date: 01/14/2023    ECHOCARDIOGRAM REPORT   Patient Name:   Troy May Date of Exam: 01/14/2023 Medical Rec #:  [id number]  Height:       70.0 in Accession #:    [id number] Weight:       211.0 lb Date of Birth:  1951-07-05  BSA:          2.135 m Patient Age:    72 years   BP:           122/82 mmHg Patient Gender: M          HR:           84 bpm. Exam Location:  Inpatient Procedure: 2D Echo, 3D Echo, Color Doppler, Cardiac Doppler and Intracardiac            Opacification Agent REPORT CONTAINS CRITICAL RESULT Indications:    Pulmonary Embolus  History:        Patient has prior history of Echocardiogram examinations, most                 recent 07/02/2022. CHF, CAD, AKI, Arrythmias:Atrial Fibrillation;                 Risk Factors:Hypertension, Dyslipidemia and Diabetes. H/o apical                 thrombus (off Xarelto since 5/6 for renal mass removal on 5/10).  Sonographer:    Milbert Coulter Referring Phys: [phone] Troy May CALDWELL POWELL JR IMPRESSIONS  1. Left ventricular ejection fraction, by estimation, is 25 to 30%. The left ventricle has severely decreased function. The left ventricle demonstrates regional wall motion abnormalities with akinesis of the apex and the peri-apical segments. There is moderate concentric left ventricular hypertrophy. Left ventricular diastolic parameters are consistent with Grade I diastolic dysfunction (impaired relaxation). Large globular 2 x 2.1 cm apical thrombus present.  2. Right ventricular systolic function is mildly reduced. The right ventricular size is mildly enlarged. There is normal pulmonary artery systolic pressure. The estimated right ventricular systolic pressure is 33.7 mmHg.  3. Left atrial size was mildly dilated.  4. The mitral valve  is normal in structure. Trivial mitral valve regurgitation. No evidence of mitral stenosis.  5. The aortic valve is tricuspid. There is mild calcification of the aortic valve. Aortic valve regurgitation is not visualized. No aortic stenosis is present.  6. Aortic dilatation noted. There is mild dilatation of the ascending aorta, measuring 38 mm.  7. The inferior vena  cava is normal in size with greater than 50% respiratory variability, suggesting right atrial pressure of 3 mmHg. FINDINGS  Left Ventricle: Left ventricular ejection fraction, by estimation, is 25 to 30%. The left ventricle has severely decreased function. The left ventricle demonstrates regional wall motion abnormalities. Definity contrast agent was given IV to delineate the left ventricular endocardial borders. The left ventricular internal cavity size was normal in size. There is moderate concentric left ventricular hypertrophy. Left ventricular diastolic parameters are consistent with Grade I diastolic dysfunction (impaired relaxation). Right Ventricle: The right ventricular size is mildly enlarged. No increase in right ventricular wall thickness. Right ventricular systolic function is mildly reduced. There is normal pulmonary artery systolic pressure. The tricuspid regurgitant velocity  is 2.77 m/s, and with an assumed right atrial pressure of 3 mmHg, the estimated right ventricular systolic pressure is 33.7 mmHg. Left Atrium: Left atrial size was mildly dilated. Right Atrium: Right atrial size was normal in size. Pericardium: There is no evidence of pericardial effusion. Mitral Valve: The mitral valve is normal in structure. Trivial mitral valve regurgitation. No evidence of mitral valve stenosis. Tricuspid Valve: The tricuspid valve is normal in structure. Tricuspid valve regurgitation is trivial. Aortic Valve: The aortic valve is tricuspid. There is mild calcification of the aortic valve. Aortic valve regurgitation is not visualized. No aortic  stenosis is present. Aortic valve mean gradient measures 5.0 mmHg. Aortic valve peak gradient measures 10.1 mmHg. Aortic valve area, by VTI measures 2.66 cm. Pulmonic Valve: The pulmonic valve was normal in structure. Pulmonic valve regurgitation is not visualized. Aorta: Aortic dilatation noted. There is mild dilatation of the ascending aorta, measuring 38 mm. Venous: The inferior vena cava is normal in size with greater than 50% respiratory variability, suggesting right atrial pressure of 3 mmHg. IAS/Shunts: No atrial level shunt detected by color flow Doppler. Additional Comments: Troy May device lead is visualized in the right ventricle.  LEFT VENTRICLE PLAX 2D LVIDd:         5.30 cm      Diastology LVIDs:         4.10 cm      LV e' medial:    4.24 cm/s LV PW:         1.10 cm      LV E/e' medial:  9.2 LV IVS:        1.10 cm      LV e' lateral:   8.16 cm/s LVOT diam:     2.00 cm      LV E/e' lateral: 4.8 LV SV:         65 LV SV Index:   30 LVOT Area:     3.14 cm  LV Volumes (MOD) LV vol d, MOD A2C: 117.0 ml LV vol d, MOD A4C: 101.0 ml LV vol s, MOD A2C: 72.9 ml LV vol s, MOD A4C: 65.5 ml LV SV MOD A2C:     44.1 ml LV SV MOD A4C:     101.0 ml LV SV MOD BP:      42.2 ml RIGHT VENTRICLE RV Basal diam:  3.40 cm RV Mid diam:    2.50 cm RV S prime:     10.20 cm/s TAPSE (M-mode): 1.6 cm LEFT ATRIUM             Index        RIGHT ATRIUM           Index LA diam:        3.70 cm 1.73 cm/m  RA Area:     14.80 cm LA Vol (A2C):   38.1 ml 17.84 ml/m  RA Volume:   34.40 ml  16.11 ml/m LA Vol (A4C):   36.3 ml 17.00 ml/m LA Biplane Vol: 38.2 ml 17.89 ml/m  AORTIC VALVE AV Area (Vmax):    2.65 cm AV Area (Vmean):   2.43 cm AV Area (VTI):     2.66 cm AV Vmax:           159.00 cm/s AV Vmean:          107.000 cm/s AV VTI:            0.243 m AV Peak Grad:      10.1 mmHg AV Mean Grad:      5.0 mmHg LVOT Vmax:         134.00 cm/s LVOT Vmean:        82.600 cm/s LVOT VTI:          0.206 m LVOT/AV VTI ratio: 0.85  AORTA Ao Root diam:  3.40 cm Ao Asc diam:  3.80 cm MITRAL VALVE               TRICUSPID VALVE MV Area (PHT): 3.08 cm    TR Peak grad:   30.7 mmHg MV Decel Time: 246 msec    TR Vmax:        277.00 cm/s MV E velocity: 38.80 cm/s MV Taite Baldassari velocity: 74.40 cm/s  SHUNTS MV E/Troy May ratio:  0.52        Systemic VTI:  0.21 m                            Systemic Diam: 2.00 cm Troy McleanMD Electronically signed by Wilfred Lacy Signature Date/Time: 01/14/2023/10:12:47 AM    Final    NM Pulmonary Perfusion  Result Date: 01/13/2023 CLINICAL DATA:  High probability for pulmonary embolism. EXAM: NUCLEAR MEDICINE PERFUSION LUNG SCAN TECHNIQUE: Perfusion images were obtained in multiple projections after intravenous injection of radiopharmaceutical. RADIOPHARMACEUTICALS:  4.2 mCi Tc-72m MAA COMPARISON:  Chest radiograph 11/12/2022 FINDINGS: Multiple peripheral wedge-shaped perfusion defects in LEFT and RIGHT lung. Medium-size peripheral defect in the anterior LEFT lower lobe on LPO projection Smaller peripheral perfusion defect in the anterior RIGHT upper lobe and superior segment of the RIGHT lower lobe. Comparison chest x-ray is clear. IMPRESSION: Bilateral medium size and small peripheral perfusion defects consistent with acute pulmonary embolism. These results will be called to the ordering clinician or representative by the Radiologist Assistant, and communication documented in the PACS or [employer]. Electronically Signed   By: Genevive Bi M.D.   On: 01/13/2023 15:59        Scheduled Meds:  docusate sodium  100 mg Oral BID   metoprolol succinate  50 mg Oral QHS   Continuous Infusions:  sodium chloride 50 mL/hr at 01/14/23 1006   heparin 1,550 Units/hr (01/14/23 0440)     LOS: 7 days    Time spent: over 30 min    Lacretia Nicks, MD [facility]   To contact the attending provider between 7A-7P or the covering provider during after hours 7P-7A, please log into the web site [email or web address] and access using  universal [place] password for that web site. If you do not have the password, please call the hospital operator.  01/14/2023, 2:31 PM

## 2023-01-14 NOTE — Progress Notes (Signed)
BLE venous duplex has been completed.    Results can be found under chart review under CV PROC. 01/14/2023 10:59 AM Olivia Royse RVT, RDMS

## 2023-01-14 NOTE — Plan of Care (Signed)
  Problem: Respiratory: Goal: Ability to achieve and maintain a regular respiratory rate will improve Outcome: Progressing   Problem: Clinical Measurements: Goal: Respiratory complications will improve Outcome: Progressing   Problem: Education: Goal: Knowledge of the prescribed therapeutic regimen will improve Outcome: Adequate for Discharge   Problem: Skin Integrity: Goal: Demonstration of wound healing without infection will improve Outcome: Adequate for Discharge   Problem: Clinical Measurements: Goal: Will remain free from infection Outcome: Adequate for Discharge   Problem: Activity: Goal: Risk for activity intolerance will decrease Outcome: Adequate for Discharge   Problem: Nutrition: Goal: Adequate nutrition will be maintained Outcome: Adequate for Discharge

## 2023-01-14 NOTE — Progress Notes (Signed)
ANTICOAGULATION CONSULT NOTE - follow up  Pharmacy Consult for Heparin Indication:  new PE  No Known Allergies  Patient Measurements: Height: 5\' 10"  (177.8 cm) Weight: 95.7 kg (211 lb) IBW/kg (Calculated) : 73 Heparin Dosing Weight:  92.4 kg  Vital Signs: Temp: 98.8 F (37.1 C) (05/17 0501) Temp Source: Oral (05/17 0501) BP: 122/82 (05/17 0600) Pulse Rate: 68 (05/17 0600)  Labs: Recent Labs    01/12/23 0801 01/13/23 0415 01/14/23 0116 01/14/23 1051  HGB 8.5*  --  9.0*  --   HCT 27.3*  --  28.0*  --   PLT 120*  --  181  --   HEPARINUNFRC  --   --  0.80* 0.62  CREATININE 2.24* 2.69* 2.94*  --      Estimated Creatinine Clearance: 26.4 mL/min (A) (by C-G formula based on SCr of 2.94 mg/dL (H)).   Medical History: Past Medical History:  Diagnosis Date   Ascending aortic aneurysm (HCC)    Bilateral renal masses    CAD (coronary artery disease)    CHF (congestive heart failure) (HCC)    HLD (hyperlipidemia)    Hypertension    LV (left ventricular) mural thrombus    Systolic heart failure (HCC)     Assessment: AC/Heme: PTA Xarelto for LV mural thrombus held (LD 5/6). Urology Surgery 5/10 -SCDs ordered post-op 5/11: Left upper port site oozing sanguineous drainage. JP site dressing saturated. Hgb drop to 10.5. 5/12: Hgb down to 8.5, Plts 179>89? Dressings dry today 5/13: Hgb 8.2 > 9.2, drain output slowed significantly  5/14: Hgb down to 8.5, Plt low but improved to 100 5/15: Hgb stable at 8.5. Plt low but improved to 120. MD started SQ heparin q12h 5/16: VQ scan: New BL PE.   01/14/2023 HL 0.62, therapeutic on 1550 units/hr Hgb 9.0,  pltc now WNL No bleeding noted per RN report  Goal of Therapy:  Heparin level 0.3-0.7 units/ml Monitor platelets by anticoagulation protocol: Yes   Plan:  Continue heparin at 1550 units/hr Daily heparin level, CBC. Await word on plans for transitioning back to PO Xarelto.  Elie Goody, PharmD, BCPS Clinical  Pharmacist 01/14/2023  12:22 PM

## 2023-01-14 NOTE — Progress Notes (Addendum)
Rounding Note    Patient Name: Troy May Date of Encounter: 01/14/2023  [facility] Cardiologist: None   Subjective   Patient denies chest pain or shortness of breath. Echo showed LV thrombus, EF 25-30%. He is on IV heparin. SCR/Bun worse. Does not appear significantly volume up.   Inpatient Medications    Scheduled Meds:  docusate sodium  100 mg Oral BID   metoprolol succinate  50 mg Oral QHS   Continuous Infusions:  sodium chloride 50 mL/hr at 01/14/23 1006   heparin 1,550 Units/hr (01/14/23 0440)   PRN Meds: traMADol   Vital Signs    Vitals:   01/13/23 1530 01/13/23 2100 01/14/23 0501 01/14/23 0600  BP:  (!) 157/88 125/72 122/82  Pulse:  76 68 68  Resp:   17 18  Temp:  98.9 F (37.2 C) 98.8 F (37.1 C)   TempSrc:  Oral Oral   SpO2: 90% 100% 98% 98%  Weight:      Height:        Intake/Output Summary (Last 24 hours) at 01/14/2023 1217 Last data filed at 01/14/2023 0930 Gross per 24 hour  Intake 322.13 ml  Output 1075 ml  Net -752.87 ml      01/13/2023    5:00 AM 01/07/2023    2:59 PM 01/07/2023    6:06 AM  Last 3 Weights  Weight (lbs) 211 lb 204 lb 12.9 oz 202 lb 13.2 oz  Weight (kg) 95.709 kg 92.9 kg 92 kg      Telemetry    Nsr HR 60s - Personally Reviewed  ECG    No new - Personally Reviewed  Physical Exam   GEN: No acute distress.   Neck: No JVD Cardiac: RRR, no murmurs, rubs, or gallops.  Respiratory: Clear to auscultation bilaterally. GI: Soft, nontender, non-distended  MS: No edema; No deformity. Neuro:  Nonfocal  Psych: Normal affect   Labs    High Sensitivity Troponin:  No results for input(s): "TROPONINIHS" in the last 720 hours.   Chemistry Recent Labs  Lab 01/12/23 0801 01/13/23 0415 01/14/23 0116  NA 131* 130* 134*  K 3.9 4.1 4.2  CL 101 98 100  CO2 21* 22 24  GLUCOSE 114* 111* 124*  BUN 30* 31* 38*  CREATININE 2.24* 2.69* 2.94*  CALCIUM 8.0* 8.0* 8.3*  GFRNONAA 31* 25* 22*  ANIONGAP 9 10 10      Lipids No results for input(s): "CHOL", "TRIG", "HDL", "LABVLDL", "LDLCALC", "CHOLHDL" in the last 168 hours.  Hematology Recent Labs  Lab 01/11/23 0819 01/12/23 0801 01/14/23 0116  WBC 7.5 7.8 9.3  RBC 3.01* 3.02* 3.13*  HGB 8.5* 8.5* 9.0*  HCT 27.2* 27.3* 28.0*  MCV 90.4 90.4 89.5  MCH 28.2 28.1 28.8  MCHC 31.3 31.1 32.1  RDW 17.0* 17.1* 17.1*  PLT 100* 120* 181   Thyroid No results for input(s): "TSH", "FREET4" in the last 168 hours.  BNP Recent Labs  Lab 01/12/23 0801  BNP 1,837.4*    DDimer No results for input(s): "DDIMER" in the last 168 hours.   Radiology    US RENAL  Result Date: 01/14/2023 CLINICAL DATA:  [id number] AKI (acute kidney injury) (HCC) [id number] EXAM: RENAL / URINARY TRACT ULTRASOUND COMPLETE COMPARISON:  MRI 11/10/2022 FINDINGS: Right Kidney: Renal measurements: 10.8 x 5.2 x 4.9 cm = volume: 145.3 mL. No hydronephrosis. Increased renal echogenicity. There is a solid mass measuring 5.4 x 5.6 x 4.7 cm, as seen on prior MRI. Left Kidney: Renal measurements: 12.4  x 5.6 x 4.5 cm = volume: 161.3 mL. No hydronephrosis. Increased renal cortical echogenicity. Poorly visualized due to body habitus. History of prior partial left nephrectomy. Bladder: Appears normal for degree of bladder distention. Other: None. IMPRESSION: No hydronephrosis. Increased renal cortical echogenicity bilaterally, as can be seen in medical renal disease. Solid right renal mass measuring 5.4 x 5.6 x 4.7 cm, as seen on prior MRI, compatible with renal neoplasm. Electronically Signed   By: Caprice Renshaw M.D.   On: 01/14/2023 12:03   VAS [place] LOWER EXTREMITY VENOUS (DVT)  Result Date: 01/14/2023  Lower Venous DVT Study Patient Name:  Troy May  Date of Exam:   01/14/2023 Medical Rec #: [id number]   Accession #:    [id number] Date of Birth: 1951/01/24   Patient Gender: M Patient Age:   72 years Exam Location:  [facility] Procedure:      VAS [place] LOWER EXTREMITY VENOUS (DVT) Referring Phys: A POWELL JR  --------------------------------------------------------------------------------  Indications: Pulmonary embolism.  Comparison Study: No previous exams Performing Technologist: Jody Hill RVT, RDMS  Examination Guidelines: A complete evaluation includes B-mode imaging, spectral Doppler, color Doppler, and power Doppler as needed of all accessible portions of each vessel. Bilateral testing is considered an integral part of a complete examination. Limited examinations for reoccurring indications may be performed as noted. The reflux portion of the exam is performed with the patient in reverse Trendelenburg.  +---------+---------------+---------+-----------+----------+--------------+ RIGHT    CompressibilityPhasicitySpontaneityPropertiesThrombus Aging +---------+---------------+---------+-----------+----------+--------------+ CFV      Full           Yes      Yes                                 +---------+---------------+---------+-----------+----------+--------------+ SFJ      Full                                                        +---------+---------------+---------+-----------+----------+--------------+ FV Prox  Full           Yes      Yes                                 +---------+---------------+---------+-----------+----------+--------------+ FV Mid   Full           Yes      Yes                                 +---------+---------------+---------+-----------+----------+--------------+ FV DistalFull           Yes      Yes                                 +---------+---------------+---------+-----------+----------+--------------+ PFV      Full                                                        +---------+---------------+---------+-----------+----------+--------------+ POP  Full           Yes      Yes                                 +---------+---------------+---------+-----------+----------+--------------+ PTV      Full                                                         +---------+---------------+---------+-----------+----------+--------------+ PERO     Full                                                        +---------+---------------+---------+-----------+----------+--------------+   +----------+---------------+---------+-----------+----------+--------------+ LEFT      CompressibilityPhasicitySpontaneityPropertiesThrombus Aging +----------+---------------+---------+-----------+----------+--------------+ CFV       Full           Yes      Yes                                 +----------+---------------+---------+-----------+----------+--------------+ SFJ       Full                                                        +----------+---------------+---------+-----------+----------+--------------+ FV Prox   Full           Yes      Yes                                 +----------+---------------+---------+-----------+----------+--------------+ FV Mid    Full           Yes      Yes                                 +----------+---------------+---------+-----------+----------+--------------+ FV Distal Full           Yes      Yes                                 +----------+---------------+---------+-----------+----------+--------------+ PFV       Full                                                        +----------+---------------+---------+-----------+----------+--------------+ POP       Full           Yes      Yes                                 +----------+---------------+---------+-----------+----------+--------------+ Gastroc  None           No       No                   Acute          +----------+---------------+---------+-----------+----------+--------------+ PerforatorNone           No       No                   Acute          +----------+---------------+---------+-----------+----------+--------------+    Summary: BILATERAL: -No evidence of popliteal cyst, bilaterally.  RIGHT: - There is no evidence of deep vein thrombosis in the lower extremity.  LEFT: - Findings consistent with acute deep vein thrombosis involving the left gastrocnemius veins. - Findings consistent with acute thrombosis involving a perforator vein of the calf.  *See table(s) above for measurements and observations.    Preliminary    ECHOCARDIOGRAM COMPLETE  Result Date: 01/14/2023    ECHOCARDIOGRAM REPORT   Patient Name:   Troy May Date of Exam: 01/14/2023 Medical Rec #:  [id number]  Height:       70.0 in Accession #:    [id number] Weight:       211.0 lb Date of Birth:  28-Nov-1950  BSA:          2.135 m Patient Age:    72 years   BP:           122/82 mmHg Patient Gender: M          HR:           84 bpm. Exam Location:  Inpatient Procedure: 2D Echo, 3D Echo, Color Doppler, Cardiac Doppler and Intracardiac            Opacification Agent REPORT CONTAINS CRITICAL RESULT Indications:    Pulmonary Embolus  History:        Patient has prior history of Echocardiogram examinations, most                 recent 07/02/2022. CHF, CAD, AKI, Arrythmias:Atrial Fibrillation;                 Risk Factors:Hypertension, Dyslipidemia and Diabetes. H/o apical                 thrombus (off Xarelto since 5/6 for renal mass removal on 5/10).  Sonographer:    Milbert Coulter Referring Phys: [phone] A CALDWELL POWELL JR IMPRESSIONS  1. Left ventricular ejection fraction, by estimation, is 25 to 30%. The left ventricle has severely decreased function. The left ventricle demonstrates regional wall motion abnormalities with akinesis of the apex and the peri-apical segments. There is moderate concentric left ventricular hypertrophy. Left ventricular diastolic parameters are consistent with Grade I diastolic dysfunction (impaired relaxation). Large globular 2 x 2.1 cm apical thrombus present.  2. Right ventricular systolic function is mildly reduced. The right ventricular size is mildly enlarged. There is normal pulmonary artery systolic  pressure. The estimated right ventricular systolic pressure is 33.7 mmHg.  3. Left atrial size was mildly dilated.  4. The mitral valve is normal in structure. Trivial mitral valve regurgitation. No evidence of mitral stenosis.  5. The aortic valve is tricuspid. There is mild calcification of the aortic valve. Aortic valve regurgitation is not visualized. No aortic stenosis is present.  6. Aortic dilatation noted. There is mild dilatation of the ascending aorta, measuring 38 mm.  7. The inferior vena cava is normal  in size with greater than 50% respiratory variability, suggesting right atrial pressure of 3 mmHg. FINDINGS  Left Ventricle: Left ventricular ejection fraction, by estimation, is 25 to 30%. The left ventricle has severely decreased function. The left ventricle demonstrates regional wall motion abnormalities. Definity contrast agent was given IV to delineate the left ventricular endocardial borders. The left ventricular internal cavity size was normal in size. There is moderate concentric left ventricular hypertrophy. Left ventricular diastolic parameters are consistent with Grade I diastolic dysfunction (impaired relaxation). Right Ventricle: The right ventricular size is mildly enlarged. No increase in right ventricular wall thickness. Right ventricular systolic function is mildly reduced. There is normal pulmonary artery systolic pressure. The tricuspid regurgitant velocity  is 2.77 m/s, and with an assumed right atrial pressure of 3 mmHg, the estimated right ventricular systolic pressure is 33.7 mmHg. Left Atrium: Left atrial size was mildly dilated. Right Atrium: Right atrial size was normal in size. Pericardium: There is no evidence of pericardial effusion. Mitral Valve: The mitral valve is normal in structure. Trivial mitral valve regurgitation. No evidence of mitral valve stenosis. Tricuspid Valve: The tricuspid valve is normal in structure. Tricuspid valve regurgitation is trivial. Aortic Valve:  The aortic valve is tricuspid. There is mild calcification of the aortic valve. Aortic valve regurgitation is not visualized. No aortic stenosis is present. Aortic valve mean gradient measures 5.0 mmHg. Aortic valve peak gradient measures 10.1 mmHg. Aortic valve area, by VTI measures 2.66 cm. Pulmonic Valve: The pulmonic valve was normal in structure. Pulmonic valve regurgitation is not visualized. Aorta: Aortic dilatation noted. There is mild dilatation of the ascending aorta, measuring 38 mm. Venous: The inferior vena cava is normal in size with greater than 50% respiratory variability, suggesting right atrial pressure of 3 mmHg. IAS/Shunts: No atrial level shunt detected by color flow Doppler. Additional Comments: A device lead is visualized in the right ventricle.  LEFT VENTRICLE PLAX 2D LVIDd:         5.30 cm      Diastology LVIDs:         4.10 cm      LV e' medial:    4.24 cm/s LV PW:         1.10 cm      LV E/e' medial:  9.2 LV IVS:        1.10 cm      LV e' lateral:   8.16 cm/s LVOT diam:     2.00 cm      LV E/e' lateral: 4.8 LV SV:         65 LV SV Index:   30 LVOT Area:     3.14 cm  LV Volumes (MOD) LV vol d, MOD A2C: 117.0 ml LV vol d, MOD A4C: 101.0 ml LV vol s, MOD A2C: 72.9 ml LV vol s, MOD A4C: 65.5 ml LV SV MOD A2C:     44.1 ml LV SV MOD A4C:     101.0 ml LV SV MOD BP:      42.2 ml RIGHT VENTRICLE RV Basal diam:  3.40 cm RV Mid diam:    2.50 cm RV S prime:     10.20 cm/s TAPSE (M-mode): 1.6 cm LEFT ATRIUM             Index        RIGHT ATRIUM           Index LA diam:        3.70 cm 1.73 cm/m   RA  Area:     14.80 cm LA Vol (A2C):   38.1 ml 17.84 ml/m  RA Volume:   34.40 ml  16.11 ml/m LA Vol (A4C):   36.3 ml 17.00 ml/m LA Biplane Vol: 38.2 ml 17.89 ml/m  AORTIC VALVE AV Area (Vmax):    2.65 cm AV Area (Vmean):   2.43 cm AV Area (VTI):     2.66 cm AV Vmax:           159.00 cm/s AV Vmean:          107.000 cm/s AV VTI:            0.243 m AV Peak Grad:      10.1 mmHg AV Mean Grad:      5.0 mmHg  LVOT Vmax:         134.00 cm/s LVOT Vmean:        82.600 cm/s LVOT VTI:          0.206 m LVOT/AV VTI ratio: 0.85  AORTA Ao Root diam: 3.40 cm Ao Asc diam:  3.80 cm MITRAL VALVE               TRICUSPID VALVE MV Area (PHT): 3.08 cm    TR Peak grad:   30.7 mmHg MV Decel Time: 246 msec    TR Vmax:        277.00 cm/s MV E velocity: 38.80 cm/s MV A velocity: 74.40 cm/s  SHUNTS MV E/A ratio:  0.52        Systemic VTI:  0.21 m                            Systemic Diam: 2.00 cm Dalton McleanMD Electronically signed by Wilfred Lacy Signature Date/Time: 01/14/2023/10:12:47 AM    Final    NM Pulmonary Perfusion  Result Date: 01/13/2023 CLINICAL DATA:  High probability for pulmonary embolism. EXAM: NUCLEAR MEDICINE PERFUSION LUNG SCAN TECHNIQUE: Perfusion images were obtained in multiple projections after intravenous injection of radiopharmaceutical. RADIOPHARMACEUTICALS:  4.2 mCi Tc-62m MAA COMPARISON:  Chest radiograph 11/12/2022 FINDINGS: Multiple peripheral wedge-shaped perfusion defects in LEFT and RIGHT lung. Medium-size peripheral defect in the anterior LEFT lower lobe on LPO projection Smaller peripheral perfusion defect in the anterior RIGHT upper lobe and superior segment of the RIGHT lower lobe. Comparison chest x-ray is clear. IMPRESSION: Bilateral medium size and small peripheral perfusion defects consistent with acute pulmonary embolism. These results will be called to the ordering clinician or representative by the Radiologist Assistant, and communication documented in the PACS or [employer]. Electronically Signed   By: Genevive Bi M.D.   On: 01/13/2023 15:59    Cardiac Studies   Echo 01/14/23 1. Left ventricular ejection fraction, by estimation, is 25 to 30%. The  left ventricle has severely decreased function. The left ventricle  demonstrates regional wall motion abnormalities with akinesis of the apex  and the peri-apical segments. There is  moderate concentric left ventricular  hypertrophy. Left ventricular  diastolic parameters are consistent with Grade I diastolic dysfunction  (impaired relaxation). Large globular 2 x 2.1 cm apical thrombus present.   2. Right ventricular systolic function is mildly reduced. The right  ventricular size is mildly enlarged. There is normal pulmonary artery  systolic pressure. The estimated right ventricular systolic pressure is  33.7 mmHg.   3. Left atrial size was mildly dilated.   4. The mitral valve is normal in structure. Trivial mitral valve  regurgitation. No evidence  of mitral stenosis.   5. The aortic valve is tricuspid. There is mild calcification of the  aortic valve. Aortic valve regurgitation is not visualized. No aortic  stenosis is present.   6. Aortic dilatation noted. There is mild dilatation of the ascending  aorta, measuring 38 mm.   7. The inferior vena cava is normal in size with greater than 50%  respiratory variability, suggesting right atrial pressure of 3 mmHg.   Right/ Left Cardiac Catheterization 07/27/2021:   Ost RCA to Prox RCA lesion is 100% stenosed.   Prox Cx to Mid Cx lesion is 30% stenosed.   1st Mrg lesion is 20% stenosed.   Prox LAD lesion is 60% stenosed.   Mid LAD lesion is 90% stenosed.   Mid LAD to Dist LAD lesion is 100% stenosed.   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 Continue diuresis. Titrate GDMT. Place PICC. Will need cMRI to assess viability of LAD territory but suspect minimal viability based on echo.    Will discuss management of RCC with IR with possible embolization.    Diagnostic Dominance: Right  _______________   Cardiac MRI 07/28/2021: Impression: Severe  LV dysfunction. LAD and RCA territories with scar due not appear viable.   Large LV thrombus. _______________   Echocardiogram 07/02/2022: Impressions:  1. The left ventricle demonstrates regional wall motion abnormalities  (Apical infarct without LV thrombus, evidence of LAD and RCA infarct, LVEF  25-30%). There is moderate concentric left ventricular hypertrophy. Left  ventricular diastolic parameters  are consistent with Grade I diastolic dysfunction (impaired relaxation).   2. Right ventricular systolic function is normal. The right ventricular  size is normal. There is normal pulmonary artery systolic pressure.   3. Left atrial size was mildly dilated.   4. The mitral valve is normal in structure. Mild mitral valve  regurgitation. No evidence of mitral stenosis.   5. Tricuspid valve regurgitation is mild to moderate.   6. The aortic valve is tricuspid. Aortic valve regurgitation is not  visualized.   7. Aortic dilatation noted. There is mild dilatation of the ascending  aorta, measuring 41 mm.   8. The inferior vena cava is normal in size with greater than 50%  respiratory variability, suggesting right atrial pressure of 3 mmHg.   Comparison(s): Resolution of apical thrombus; study done with contrast.   Patient Profile     72 y.o. male  with a history of severe 2-vessel CAD with CTO of ostial to proximal RCA and mid to distal LAD on cardiac catheterization in 06/2021 (areas not viable on cardiac MRI), ischemic cardiomyopathy with biventricular CHF and LVEF of 25-30% in 06/2022, LV thrombus on Echo in 06/2021 (resolved on last Echo in 06/2022 but remains on Xraelto), dilatated ascending thoracic aorta,  hypertension, hyperlipidemia, CKD stage III, and bilateral renal masses suspicious for renal cell carcinoma  who is being seen 01/12/2023 for the evaluation of acute on chronic CHF   Assessment & Plan    Acute hypoxic respiratory failure Acute on chronic BiV CHF ICM - Last echo  06/2022 showed LVEF 25-30% with persistent wall motion abnormalities,  G1DD, normal RV - admitted for planned partial left nephrectomy and has required supplemental O2 since then - CXR with small left pleural effusion  - s/p IV lasix - Net -1.6L - PTA entresto and spiro held on admission - continue toprol and Jardiance - repeat echo ordered showed LVEF 25-30%, WMA with akinesis of the apex and per-apical segments. Moderate LVH with G1DD, mildly reduced RVSF, trivial MR - kidney function continues to climb>continue to hold nephrotoxic meds - patient appears euvolemic on exam  CAD - h/o 2V CAD with CTO of RCA and LAD on cardiac cath in 06/2021 - cMRI at that time showed scar in the RCA and LAD territories - no chest pain reported - continue ASA, BB and statin  LV thrombus - noted on echo 06/2021. Last echo 06/2022 showed complete resolution of thrombus, but remained on Xarelto - Xarelto held for nephrectomy - repeat echo shower recurrent LV thrombus> on IV heparin - plan to restart Xarelto 15mg  daily   CKD stage 3 - Scr 1.38 on admission - Baseline Scr 1.4-1.6 - kidney function continues to worsen, not on nephrotoxic meds - continue to trend kidney function   For questions or updates, please contact [facility] Please consult [email or web address] for contact info under        Signed, Cadence David Stall, PA-C  01/14/2023, 12:17 PM     I have seen and examined the patient along with Cadence David Stall, PA-C .  I have reviewed the chart, notes and new data.  I agree with PA/NP's note.  Key new complaints: breathing better, no pleuritic pain, no neuro complaints. No overt bleeding. Key examination changes: 1+ pretibial edema on L, no edema right leg, clear lungs, normal JVP. RRR Key new findings / data: Lung perfusion scan shows multiple defects c/w acute pulmonary embolism. Echo shows a new thrombus in the LV apex. It is fairly broad-based and fixed, but is clearly  "fresh".  PLAN: Continue IV heparin and transition to oral anticoagulant, to be continued indefinitely. He has a "provoked" PE for which he only needs 3-6 months of anticoagulation, but he should remain on lifelong anticoagulation for LV thrombus. He is euvolemic or slightly "dry". Holding diuretics at this time.  All his HF meds are on hold due to worsening renal function and will need to be gradually reintroduced. Would start with Jardiance, then a lower dose of Entresto that we can gradually titrate back to max dose Would restart torsemide and spironolactone only once he starts shoing signs of volume gain. GFR is currently<30, but I suspect we will see improvement. When he is ready to change to oral anticoagulant, for the first 3 months will use Xarelto dose for acute PE: - For the first 21 days, 15 mg twice daily with food; Starting at day 22, change to 20 mg once daily - After 3 months would switch to the dose for intracardiac thrombus prevention (adjusted for renal dysfunction), 15 mg daily, indefinitely. Will follow over the weekend to gradually reintroduce HF meds.  Thurmon Fair, MD, [facility] [phone] 01/14/2023, 1:22 PM

## 2023-01-15 DIAGNOSIS — I5043 Acute on chronic combined systolic (congestive) and diastolic (congestive) heart failure: Secondary | ICD-10-CM | POA: Diagnosis not present

## 2023-01-15 DIAGNOSIS — N2889 Other specified disorders of kidney and ureter: Secondary | ICD-10-CM | POA: Diagnosis not present

## 2023-01-15 DIAGNOSIS — C649 Malignant neoplasm of unspecified kidney, except renal pelvis: Secondary | ICD-10-CM | POA: Diagnosis not present

## 2023-01-15 DIAGNOSIS — I5082 Biventricular heart failure: Secondary | ICD-10-CM | POA: Diagnosis not present

## 2023-01-15 DIAGNOSIS — I2510 Atherosclerotic heart disease of native coronary artery without angina pectoris: Secondary | ICD-10-CM | POA: Diagnosis not present

## 2023-01-15 LAB — BASIC METABOLIC PANEL WITH GFR
Anion gap: 8 (ref 5–15)
BUN: 32 mg/dL — ABNORMAL HIGH (ref 8–23)
CO2: 25 mmol/L (ref 22–32)
Calcium: 7.9 mg/dL — ABNORMAL LOW (ref 8.9–10.3)
Chloride: 104 mmol/L (ref 98–111)
Creatinine, Ser: 2.18 mg/dL — ABNORMAL HIGH (ref 0.61–1.24)
GFR, Estimated: 31 mL/min — ABNORMAL LOW
Glucose, Bld: 106 mg/dL — ABNORMAL HIGH (ref 70–99)
Potassium: 4.3 mmol/L (ref 3.5–5.1)
Sodium: 137 mmol/L (ref 135–145)

## 2023-01-15 LAB — CBC
HCT: 24.7 % — ABNORMAL LOW (ref 39.0–52.0)
Hemoglobin: 7.8 g/dL — ABNORMAL LOW (ref 13.0–17.0)
MCH: 28.7 pg (ref 26.0–34.0)
MCHC: 31.6 g/dL (ref 30.0–36.0)
MCV: 90.8 fL (ref 80.0–100.0)
Platelets: 211 10*3/uL (ref 150–400)
RBC: 2.72 MIL/uL — ABNORMAL LOW (ref 4.22–5.81)
RDW: 17.2 % — ABNORMAL HIGH (ref 11.5–15.5)
WBC: 8.4 10*3/uL (ref 4.0–10.5)
nRBC: 0.5 % — ABNORMAL HIGH (ref 0.0–0.2)

## 2023-01-15 LAB — TYPE AND SCREEN
ABO/RH(D): O POS
Antibody Screen: NEGATIVE

## 2023-01-15 LAB — HEPARIN LEVEL (UNFRACTIONATED): Heparin Unfractionated: 0.4 [IU]/mL (ref 0.30–0.70)

## 2023-01-15 LAB — HEMOGLOBIN AND HEMATOCRIT, BLOOD
HCT: 26.7 % — ABNORMAL LOW (ref 39.0–52.0)
Hemoglobin: 8.3 g/dL — ABNORMAL LOW (ref 13.0–17.0)

## 2023-01-15 NOTE — Progress Notes (Signed)
Urology Inpatient Progress Note  Subjective: No acute events overnight. Pain controlled.  No N/V.  + BM.  No problems voiding. Continues on heparin gtt for bilateral PE's  Anti-infectives: Anti-infectives (From admission, onward)    Start     Dose/Rate Route Frequency Ordered Stop   01/07/23 1630  ceFAZolin (ANCEF) IVPB 1 g/50 mL premix        1 g 100 mL/hr over 30 Minutes Intravenous Every 8 hours 01/07/23 1602 01/08/23 0038   01/07/23 0529  ceFAZolin (ANCEF) IVPB 2g/100 mL premix        2 g 200 mL/hr over 30 Minutes Intravenous 30 min pre-op 01/07/23 0529 01/07/23 0818       Current Facility-Administered Medications  Medication Dose Route Frequency Provider Last Rate Last Admin   0.9 %  sodium chloride infusion   Intravenous Continuous Zigmund Daniel., MD 50 mL/hr at 01/15/23 0523 New Bag at 01/15/23 0523   docusate sodium (COLACE) capsule 100 mg  100 mg Oral BID Carlus Pavlov, MD   100 mg at 01/14/23 2130   heparin ADULT infusion 100 units/mL (25000 units/22mL)  1,550 Units/hr Intravenous Continuous Maurice March, RPH 15.5 mL/hr at 01/14/23 2050 1,550 Units/hr at 01/14/23 2050   metoprolol succinate (TOPROL-XL) 24 hr tablet 50 mg  50 mg Oral QHS Carlus Pavlov, MD   50 mg at 01/14/23 2130   traMADol (ULTRAM) tablet 50-100 mg  50-100 mg Oral Q6H PRN Crist Fat, MD   50 mg at 01/14/23 2130     Objective: Vital signs in last 24 hours: Temp:  [98.7 F (37.1 C)] 98.7 F (37.1 C) (05/17 1240) Pulse Rate:  [65-79] 65 (05/18 0500) Resp:  [11-21] 11 (05/18 0500) BP: (122-137)/(73-83) 122/73 (05/18 0400) SpO2:  [96 %-99 %] 97 % (05/18 0500)  Intake/Output from previous day: 05/17 0701 - 05/18 0700 In: 867.8 [P.O.:360; I.V.:507.8] Out: 1350 [Urine:1350] Intake/Output this shift: No intake/output data recorded.  GENERAL APPEARANCE:  Sitting in chair, NAD LUNGS:  clear bilaterally ABDOMEN:  Soft, non-tender, no masses; incisions intact; dressing  over JP site; + BS EXTREMITIES:  Moves all extremities well, without clubbing, cyanosis, or edema NEUROLOGIC:  Alert and oriented x 3,  CN II-XII grossly intact MENTAL STATUS:  appropriate BACK:  Non-tender to palpation, No CVAT SKIN:  Warm, dry, and intact   Lab Results:  Recent Labs    01/14/23 0116 01/15/23 0413  WBC 9.3 8.4  HGB 9.0* 7.8*  HCT 28.0* 24.7*  PLT 181 211   BMET Recent Labs    01/14/23 0116 01/15/23 0413  NA 134* 137  K 4.2 4.3  CL 100 104  CO2 24 25  GLUCOSE 124* 106*  BUN 38* 32*  CREATININE 2.94* 2.18*  CALCIUM 8.3* 7.9*   PT/INR No results for input(s): "LABPROT", "INR" in the last 72 hours. ABG No results for input(s): "PHART", "HCO3" in the last 72 hours.  Invalid input(s): "PCO2", "PO2"  Studies/Results: VAS [place] LOWER EXTREMITY VENOUS (DVT)  Result Date: 01/14/2023  Lower Venous DVT Study Patient Name:  Troy May  Date of Exam:   01/14/2023 Medical Rec #: [id number]   Accession #:    [id number] Date of Birth: 1951-03-26   Patient Gender: M Patient Age:   72 years Exam Location:  [facility] Procedure:      VAS [place] LOWER EXTREMITY VENOUS (DVT) Referring Phys: A POWELL JR --------------------------------------------------------------------------------  Indications: Pulmonary embolism.  Comparison Study: No previous exams Performing Technologist: Augusto Gamble  Hill RVT, RDMS  Examination Guidelines: A complete evaluation includes B-mode imaging, spectral Doppler, color Doppler, and power Doppler as needed of all accessible portions of each vessel. Bilateral testing is considered an integral part of a complete examination. Limited examinations for reoccurring indications may be performed as noted. The reflux portion of the exam is performed with the patient in reverse Trendelenburg.  +---------+---------------+---------+-----------+----------+--------------+ RIGHT    CompressibilityPhasicitySpontaneityPropertiesThrombus Aging  +---------+---------------+---------+-----------+----------+--------------+ CFV      Full           Yes      Yes                                 +---------+---------------+---------+-----------+----------+--------------+ SFJ      Full                                                        +---------+---------------+---------+-----------+----------+--------------+ FV Prox  Full           Yes      Yes                                 +---------+---------------+---------+-----------+----------+--------------+ FV Mid   Full           Yes      Yes                                 +---------+---------------+---------+-----------+----------+--------------+ FV DistalFull           Yes      Yes                                 +---------+---------------+---------+-----------+----------+--------------+ PFV      Full                                                        +---------+---------------+---------+-----------+----------+--------------+ POP      Full           Yes      Yes                                 +---------+---------------+---------+-----------+----------+--------------+ PTV      Full                                                        +---------+---------------+---------+-----------+----------+--------------+ PERO     Full                                                        +---------+---------------+---------+-----------+----------+--------------+   +----------+---------------+---------+-----------+----------+--------------+  LEFT      CompressibilityPhasicitySpontaneityPropertiesThrombus Aging +----------+---------------+---------+-----------+----------+--------------+ CFV       Full           Yes      Yes                                 +----------+---------------+---------+-----------+----------+--------------+ SFJ       Full                                                         +----------+---------------+---------+-----------+----------+--------------+ FV Prox   Full           Yes      Yes                                 +----------+---------------+---------+-----------+----------+--------------+ FV Mid    Full           Yes      Yes                                 +----------+---------------+---------+-----------+----------+--------------+ FV Distal Full           Yes      Yes                                 +----------+---------------+---------+-----------+----------+--------------+ PFV       Full                                                        +----------+---------------+---------+-----------+----------+--------------+ POP       Full           Yes      Yes                                 +----------+---------------+---------+-----------+----------+--------------+ Gastroc   None           No       No                   Acute          +----------+---------------+---------+-----------+----------+--------------+ PerforatorNone           No       No                   Acute          +----------+---------------+---------+-----------+----------+--------------+     Summary: BILATERAL: -No evidence of popliteal cyst, bilaterally. RIGHT: - There is no evidence of deep vein thrombosis in the lower extremity.  LEFT: - Findings consistent with acute deep vein thrombosis involving the left gastrocnemius veins. - Findings consistent with acute thrombosis involving a perforator vein of the calf.  *See table(s) above for measurements and observations. Electronically signed by Gerarda Fraction on 01/14/2023 at 2:54:42 PM.    Final    US RENAL  Result  Date: 01/14/2023 CLINICAL DATA:  [id number] AKI (acute kidney injury) (HCC) [id number] EXAM: RENAL / URINARY TRACT ULTRASOUND COMPLETE COMPARISON:  MRI 11/10/2022 FINDINGS: Right Kidney: Renal measurements: 10.8 x 5.2 x 4.9 cm = volume: 145.3 mL. No hydronephrosis. Increased renal echogenicity. There is a solid  mass measuring 5.4 x 5.6 x 4.7 cm, as seen on prior MRI. Left Kidney: Renal measurements: 12.4 x 5.6 x 4.5 cm = volume: 161.3 mL. No hydronephrosis. Increased renal cortical echogenicity. Poorly visualized due to body habitus. History of prior partial left nephrectomy. Bladder: Appears normal for degree of bladder distention. Other: None. IMPRESSION: No hydronephrosis. Increased renal cortical echogenicity bilaterally, as can be seen in medical renal disease. Solid right renal mass measuring 5.4 x 5.6 x 4.7 cm, as seen on prior MRI, compatible with renal neoplasm. Electronically Signed   By: Caprice Renshaw M.D.   On: 01/14/2023 12:03   ECHOCARDIOGRAM COMPLETE  Result Date: 01/14/2023    ECHOCARDIOGRAM REPORT   Patient Name:   Troy May Date of Exam: 01/14/2023 Medical Rec #:  [id number]  Height:       70.0 in Accession #:    [id number] Weight:       211.0 lb Date of Birth:  1951-05-17  BSA:          2.135 m Patient Age:    72 years   BP:           122/82 mmHg Patient Gender: M          HR:           84 bpm. Exam Location:  Inpatient Procedure: 2D Echo, 3D Echo, Color Doppler, Cardiac Doppler and Intracardiac            Opacification Agent REPORT CONTAINS CRITICAL RESULT Indications:    Pulmonary Embolus  History:        Patient has prior history of Echocardiogram examinations, most                 recent 07/02/2022. CHF, CAD, AKI, Arrythmias:Atrial Fibrillation;                 Risk Factors:Hypertension, Dyslipidemia and Diabetes. H/o apical                 thrombus (off Xarelto since 5/6 for renal mass removal on 5/10).  Sonographer:    Milbert Coulter Referring Phys: [phone] A CALDWELL POWELL JR IMPRESSIONS  1. Left ventricular ejection fraction, by estimation, is 25 to 30%. The left ventricle has severely decreased function. The left ventricle demonstrates regional wall motion abnormalities with akinesis of the apex and the peri-apical segments. There is moderate concentric left ventricular hypertrophy. Left  ventricular diastolic parameters are consistent with Grade I diastolic dysfunction (impaired relaxation). Large globular 2 x 2.1 cm apical thrombus present.  2. Right ventricular systolic function is mildly reduced. The right ventricular size is mildly enlarged. There is normal pulmonary artery systolic pressure. The estimated right ventricular systolic pressure is 33.7 mmHg.  3. Left atrial size was mildly dilated.  4. The mitral valve is normal in structure. Trivial mitral valve regurgitation. No evidence of mitral stenosis.  5. The aortic valve is tricuspid. There is mild calcification of the aortic valve. Aortic valve regurgitation is not visualized. No aortic stenosis is present.  6. Aortic dilatation noted. There is mild dilatation of the ascending aorta, measuring 38 mm.  7. The inferior vena cava is normal in size with greater than 50% respiratory variability, suggesting right  atrial pressure of 3 mmHg. FINDINGS  Left Ventricle: Left ventricular ejection fraction, by estimation, is 25 to 30%. The left ventricle has severely decreased function. The left ventricle demonstrates regional wall motion abnormalities. Definity contrast agent was given IV to delineate the left ventricular endocardial borders. The left ventricular internal cavity size was normal in size. There is moderate concentric left ventricular hypertrophy. Left ventricular diastolic parameters are consistent with Grade I diastolic dysfunction (impaired relaxation). Right Ventricle: The right ventricular size is mildly enlarged. No increase in right ventricular wall thickness. Right ventricular systolic function is mildly reduced. There is normal pulmonary artery systolic pressure. The tricuspid regurgitant velocity  is 2.77 m/s, and with an assumed right atrial pressure of 3 mmHg, the estimated right ventricular systolic pressure is 33.7 mmHg. Left Atrium: Left atrial size was mildly dilated. Right Atrium: Right atrial size was normal in size.  Pericardium: There is no evidence of pericardial effusion. Mitral Valve: The mitral valve is normal in structure. Trivial mitral valve regurgitation. No evidence of mitral valve stenosis. Tricuspid Valve: The tricuspid valve is normal in structure. Tricuspid valve regurgitation is trivial. Aortic Valve: The aortic valve is tricuspid. There is mild calcification of the aortic valve. Aortic valve regurgitation is not visualized. No aortic stenosis is present. Aortic valve mean gradient measures 5.0 mmHg. Aortic valve peak gradient measures 10.1 mmHg. Aortic valve area, by VTI measures 2.66 cm. Pulmonic Valve: The pulmonic valve was normal in structure. Pulmonic valve regurgitation is not visualized. Aorta: Aortic dilatation noted. There is mild dilatation of the ascending aorta, measuring 38 mm. Venous: The inferior vena cava is normal in size with greater than 50% respiratory variability, suggesting right atrial pressure of 3 mmHg. IAS/Shunts: No atrial level shunt detected by color flow Doppler. Additional Comments: A device lead is visualized in the right ventricle.  LEFT VENTRICLE PLAX 2D LVIDd:         5.30 cm      Diastology LVIDs:         4.10 cm      LV e' medial:    4.24 cm/s LV PW:         1.10 cm      LV E/e' medial:  9.2 LV IVS:        1.10 cm      LV e' lateral:   8.16 cm/s LVOT diam:     2.00 cm      LV E/e' lateral: 4.8 LV SV:         65 LV SV Index:   30 LVOT Area:     3.14 cm  LV Volumes (MOD) LV vol d, MOD A2C: 117.0 ml LV vol d, MOD A4C: 101.0 ml LV vol s, MOD A2C: 72.9 ml LV vol s, MOD A4C: 65.5 ml LV SV MOD A2C:     44.1 ml LV SV MOD A4C:     101.0 ml LV SV MOD BP:      42.2 ml RIGHT VENTRICLE RV Basal diam:  3.40 cm RV Mid diam:    2.50 cm RV S prime:     10.20 cm/s TAPSE (M-mode): 1.6 cm LEFT ATRIUM             Index        RIGHT ATRIUM           Index LA diam:        3.70 cm 1.73 cm/m   RA Area:     14.80 cm LA Vol (A2C):  38.1 ml 17.84 ml/m  RA Volume:   34.40 ml  16.11 ml/m LA Vol  (A4C):   36.3 ml 17.00 ml/m LA Biplane Vol: 38.2 ml 17.89 ml/m  AORTIC VALVE AV Area (Vmax):    2.65 cm AV Area (Vmean):   2.43 cm AV Area (VTI):     2.66 cm AV Vmax:           159.00 cm/s AV Vmean:          107.000 cm/s AV VTI:            0.243 m AV Peak Grad:      10.1 mmHg AV Mean Grad:      5.0 mmHg LVOT Vmax:         134.00 cm/s LVOT Vmean:        82.600 cm/s LVOT VTI:          0.206 m LVOT/AV VTI ratio: 0.85  AORTA Ao Root diam: 3.40 cm Ao Asc diam:  3.80 cm MITRAL VALVE               TRICUSPID VALVE MV Area (PHT): 3.08 cm    TR Peak grad:   30.7 mmHg MV Decel Time: 246 msec    TR Vmax:        277.00 cm/s MV E velocity: 38.80 cm/s MV A velocity: 74.40 cm/s  SHUNTS MV E/A ratio:  0.52        Systemic VTI:  0.21 m                            Systemic Diam: 2.00 cm Dalton McleanMD Electronically signed by Wilfred Lacy Signature Date/Time: 01/14/2023/10:12:47 AM    Final    NM Pulmonary Perfusion  Result Date: 01/13/2023 CLINICAL DATA:  High probability for pulmonary embolism. EXAM: NUCLEAR MEDICINE PERFUSION LUNG SCAN TECHNIQUE: Perfusion images were obtained in multiple projections after intravenous injection of radiopharmaceutical. RADIOPHARMACEUTICALS:  4.2 mCi Tc-15m MAA COMPARISON:  Chest radiograph 11/12/2022 FINDINGS: Multiple peripheral wedge-shaped perfusion defects in LEFT and RIGHT lung. Medium-size peripheral defect in the anterior LEFT lower lobe on LPO projection Smaller peripheral perfusion defect in the anterior RIGHT upper lobe and superior segment of the RIGHT lower lobe. Comparison chest x-ray is clear. IMPRESSION: Bilateral medium size and small peripheral perfusion defects consistent with acute pulmonary embolism. These results will be called to the ordering clinician or representative by the Radiologist Assistant, and communication documented in the PACS or [employer]. Electronically Signed   By: Genevive Bi M.D.   On: 01/13/2023 15:59     Assessment & Plan: Left  renal mass - POD #8 s/p left partial nephrectomy Acute on chronic renal insufficiency - Cr slightly improved today Bilateral pulmonary emboli - continues to require O2, on heparin gtt  Continue management per Hospital service Ambulate Disposition per Medicine   Di Kindle, MD 01/15/2023

## 2023-01-15 NOTE — Progress Notes (Signed)
Rounding Note    Patient Name: Troy May Date of Encounter: 01/15/2023  [facility] Cardiologist: None   Subjective   No changes in respiratory status.  IV heparin for recurrent LV thrombus  Inpatient Medications    Scheduled Meds:  docusate sodium  100 mg Oral BID   metoprolol succinate  50 mg Oral QHS   Continuous Infusions:  sodium chloride 50 mL/hr at 01/15/23 0523   heparin 1,550 Units/hr (01/14/23 2050)   PRN Meds: traMADol   Vital Signs    Vitals:   01/15/23 0100 01/15/23 0300 01/15/23 0400 01/15/23 0500  BP:   122/73   Pulse: 69 79 66 65  Resp: 16 16 16 11   Temp:      TempSrc:      SpO2: 98% 99% 98% 97%  Weight:      Height:        Intake/Output Summary (Last 24 hours) at 01/15/2023 0919 Last data filed at 01/15/2023 0524 Gross per 24 hour  Intake 867.76 ml  Output 1350 ml  Net -482.24 ml      01/13/2023    5:00 AM 01/07/2023    2:59 PM 01/07/2023    6:06 AM  Last 3 Weights  Weight (lbs) 211 lb 204 lb 12.9 oz 202 lb 13.2 oz  Weight (kg) 95.709 kg 92.9 kg 92 kg      Telemetry    NSR - Personally Reviewed  ECG    No new - Personally Reviewed  Physical Exam   Vitals:   01/15/23 0400 01/15/23 0500  BP: 122/73   Pulse: 66 65  Resp: 16 11  Temp:    SpO2: 98% 97%    GEN: No acute distress.  On O2 Neck: No sig JVD Cardiac: RRR, no murmurs, rubs, or gallops.  Respiratory: Clear to auscultation bilaterally. GI: non-distended  MS: No edema; No deformity. Neuro:  Nonfocal  Psych: Normal affect   Labs    High Sensitivity Troponin:  No results for input(s): "TROPONINIHS" in the last 720 hours.   Chemistry Recent Labs  Lab 01/13/23 0415 01/14/23 0116 01/15/23 0413  NA 130* 134* 137  K 4.1 4.2 4.3  CL 98 100 104  CO2 22 24 25   GLUCOSE 111* 124* 106*  BUN 31* 38* 32*  CREATININE 2.69* 2.94* 2.18*  CALCIUM 8.0* 8.3* 7.9*  GFRNONAA 25* 22* 31*  ANIONGAP 10 10 8     Lipids No results for input(s): "CHOL", "TRIG",  "HDL", "LABVLDL", "LDLCALC", "CHOLHDL" in the last 168 hours.  Hematology Recent Labs  Lab 01/12/23 0801 01/14/23 0116 01/15/23 0413  WBC 7.8 9.3 8.4  RBC 3.02* 3.13* 2.72*  HGB 8.5* 9.0* 7.8*  HCT 27.3* 28.0* 24.7*  MCV 90.4 89.5 90.8  MCH 28.1 28.8 28.7  MCHC 31.1 32.1 31.6  RDW 17.1* 17.1* 17.2*  PLT 120* 181 211   Thyroid No results for input(s): "TSH", "FREET4" in the last 168 hours.  BNP Recent Labs  Lab 01/12/23 0801  BNP 1,837.4*    DDimer No results for input(s): "DDIMER" in the last 168 hours.   Radiology    VAS [place] LOWER EXTREMITY VENOUS (DVT)  Result Date: 01/14/2023  Lower Venous DVT Study Patient Name:  Troy May  Date of Exam:   01/14/2023 Medical Rec #: [id number]   Accession #:    [id number] Date of Birth: 06-15-1951   Patient Gender: M Patient Age:   72 years Exam Location:  [facility] Procedure:  VAS [place] LOWER EXTREMITY VENOUS (DVT) Referring Phys: A POWELL JR --------------------------------------------------------------------------------  Indications: Pulmonary embolism.  Comparison Study: No previous exams Performing Technologist: Jody Hill RVT, RDMS  Examination Guidelines: A complete evaluation includes B-mode imaging, spectral Doppler, color Doppler, and power Doppler as needed of all accessible portions of each vessel. Bilateral testing is considered an integral part of a complete examination. Limited examinations for reoccurring indications may be performed as noted. The reflux portion of the exam is performed with the patient in reverse Trendelenburg.  +---------+---------------+---------+-----------+----------+--------------+ RIGHT    CompressibilityPhasicitySpontaneityPropertiesThrombus Aging +---------+---------------+---------+-----------+----------+--------------+ CFV      Full           Yes      Yes                                 +---------+---------------+---------+-----------+----------+--------------+ SFJ      Full                                                         +---------+---------------+---------+-----------+----------+--------------+ FV Prox  Full           Yes      Yes                                 +---------+---------------+---------+-----------+----------+--------------+ FV Mid   Full           Yes      Yes                                 +---------+---------------+---------+-----------+----------+--------------+ FV DistalFull           Yes      Yes                                 +---------+---------------+---------+-----------+----------+--------------+ PFV      Full                                                        +---------+---------------+---------+-----------+----------+--------------+ POP      Full           Yes      Yes                                 +---------+---------------+---------+-----------+----------+--------------+ PTV      Full                                                        +---------+---------------+---------+-----------+----------+--------------+ PERO     Full                                                        +---------+---------------+---------+-----------+----------+--------------+   +----------+---------------+---------+-----------+----------+--------------+  LEFT      CompressibilityPhasicitySpontaneityPropertiesThrombus Aging +----------+---------------+---------+-----------+----------+--------------+ CFV       Full           Yes      Yes                                 +----------+---------------+---------+-----------+----------+--------------+ SFJ       Full                                                        +----------+---------------+---------+-----------+----------+--------------+ FV Prox   Full           Yes      Yes                                 +----------+---------------+---------+-----------+----------+--------------+ FV Mid    Full           Yes      Yes                                  +----------+---------------+---------+-----------+----------+--------------+ FV Distal Full           Yes      Yes                                 +----------+---------------+---------+-----------+----------+--------------+ PFV       Full                                                        +----------+---------------+---------+-----------+----------+--------------+ POP       Full           Yes      Yes                                 +----------+---------------+---------+-----------+----------+--------------+ Gastroc   None           No       No                   Acute          +----------+---------------+---------+-----------+----------+--------------+ PerforatorNone           No       No                   Acute          +----------+---------------+---------+-----------+----------+--------------+     Summary: BILATERAL: -No evidence of popliteal cyst, bilaterally. RIGHT: - There is no evidence of deep vein thrombosis in the lower extremity.  LEFT: - Findings consistent with acute deep vein thrombosis involving the left gastrocnemius veins. - Findings consistent with acute thrombosis involving a perforator vein of the calf.  *See table(s) above for measurements and observations. Electronically signed by Gerarda Fraction on 01/14/2023 at 2:54:42 PM.    Final    US RENAL  Result  Date: 01/14/2023 CLINICAL DATA:  [id number] AKI (acute kidney injury) (HCC) [id number] EXAM: RENAL / URINARY TRACT ULTRASOUND COMPLETE COMPARISON:  MRI 11/10/2022 FINDINGS: Right Kidney: Renal measurements: 10.8 x 5.2 x 4.9 cm = volume: 145.3 mL. No hydronephrosis. Increased renal echogenicity. There is a solid mass measuring 5.4 x 5.6 x 4.7 cm, as seen on prior MRI. Left Kidney: Renal measurements: 12.4 x 5.6 x 4.5 cm = volume: 161.3 mL. No hydronephrosis. Increased renal cortical echogenicity. Poorly visualized due to body habitus. History of prior partial left nephrectomy. Bladder: Appears normal  for degree of bladder distention. Other: None. IMPRESSION: No hydronephrosis. Increased renal cortical echogenicity bilaterally, as can be seen in medical renal disease. Solid right renal mass measuring 5.4 x 5.6 x 4.7 cm, as seen on prior MRI, compatible with renal neoplasm. Electronically Signed   By: Caprice Renshaw M.D.   On: 01/14/2023 12:03   ECHOCARDIOGRAM COMPLETE  Result Date: 01/14/2023    ECHOCARDIOGRAM REPORT   Patient Name:   LAMARE ANTES Date of Exam: 01/14/2023 Medical Rec #:  [id number]  Height:       70.0 in Accession #:    [id number] Weight:       211.0 lb Date of Birth:  28-Sep-1950  BSA:          2.135 m Patient Age:    72 years   BP:           122/82 mmHg Patient Gender: M          HR:           84 bpm. Exam Location:  Inpatient Procedure: 2D Echo, 3D Echo, Color Doppler, Cardiac Doppler and Intracardiac            Opacification Agent REPORT CONTAINS CRITICAL RESULT Indications:    Pulmonary Embolus  History:        Patient has prior history of Echocardiogram examinations, most                 recent 07/02/2022. CHF, CAD, AKI, Arrythmias:Atrial Fibrillation;                 Risk Factors:Hypertension, Dyslipidemia and Diabetes. H/o apical                 thrombus (off Xarelto since 5/6 for renal mass removal on 5/10).  Sonographer:    Milbert Coulter Referring Phys: [phone] A CALDWELL POWELL JR IMPRESSIONS  1. Left ventricular ejection fraction, by estimation, is 25 to 30%. The left ventricle has severely decreased function. The left ventricle demonstrates regional wall motion abnormalities with akinesis of the apex and the peri-apical segments. There is moderate concentric left ventricular hypertrophy. Left ventricular diastolic parameters are consistent with Grade I diastolic dysfunction (impaired relaxation). Large globular 2 x 2.1 cm apical thrombus present.  2. Right ventricular systolic function is mildly reduced. The right ventricular size is mildly enlarged. There is normal pulmonary artery  systolic pressure. The estimated right ventricular systolic pressure is 33.7 mmHg.  3. Left atrial size was mildly dilated.  4. The mitral valve is normal in structure. Trivial mitral valve regurgitation. No evidence of mitral stenosis.  5. The aortic valve is tricuspid. There is mild calcification of the aortic valve. Aortic valve regurgitation is not visualized. No aortic stenosis is present.  6. Aortic dilatation noted. There is mild dilatation of the ascending aorta, measuring 38 mm.  7. The inferior vena cava is normal in size with greater than 50% respiratory variability, suggesting right  atrial pressure of 3 mmHg. FINDINGS  Left Ventricle: Left ventricular ejection fraction, by estimation, is 25 to 30%. The left ventricle has severely decreased function. The left ventricle demonstrates regional wall motion abnormalities. Definity contrast agent was given IV to delineate the left ventricular endocardial borders. The left ventricular internal cavity size was normal in size. There is moderate concentric left ventricular hypertrophy. Left ventricular diastolic parameters are consistent with Grade I diastolic dysfunction (impaired relaxation). Right Ventricle: The right ventricular size is mildly enlarged. No increase in right ventricular wall thickness. Right ventricular systolic function is mildly reduced. There is normal pulmonary artery systolic pressure. The tricuspid regurgitant velocity  is 2.77 m/s, and with an assumed right atrial pressure of 3 mmHg, the estimated right ventricular systolic pressure is 33.7 mmHg. Left Atrium: Left atrial size was mildly dilated. Right Atrium: Right atrial size was normal in size. Pericardium: There is no evidence of pericardial effusion. Mitral Valve: The mitral valve is normal in structure. Trivial mitral valve regurgitation. No evidence of mitral valve stenosis. Tricuspid Valve: The tricuspid valve is normal in structure. Tricuspid valve regurgitation is trivial. Aortic  Valve: The aortic valve is tricuspid. There is mild calcification of the aortic valve. Aortic valve regurgitation is not visualized. No aortic stenosis is present. Aortic valve mean gradient measures 5.0 mmHg. Aortic valve peak gradient measures 10.1 mmHg. Aortic valve area, by VTI measures 2.66 cm. Pulmonic Valve: The pulmonic valve was normal in structure. Pulmonic valve regurgitation is not visualized. Aorta: Aortic dilatation noted. There is mild dilatation of the ascending aorta, measuring 38 mm. Venous: The inferior vena cava is normal in size with greater than 50% respiratory variability, suggesting right atrial pressure of 3 mmHg. IAS/Shunts: No atrial level shunt detected by color flow Doppler. Additional Comments: A device lead is visualized in the right ventricle.  LEFT VENTRICLE PLAX 2D LVIDd:         5.30 cm      Diastology LVIDs:         4.10 cm      LV e' medial:    4.24 cm/s LV PW:         1.10 cm      LV E/e' medial:  9.2 LV IVS:        1.10 cm      LV e' lateral:   8.16 cm/s LVOT diam:     2.00 cm      LV E/e' lateral: 4.8 LV SV:         65 LV SV Index:   30 LVOT Area:     3.14 cm  LV Volumes (MOD) LV vol d, MOD A2C: 117.0 ml LV vol d, MOD A4C: 101.0 ml LV vol s, MOD A2C: 72.9 ml LV vol s, MOD A4C: 65.5 ml LV SV MOD A2C:     44.1 ml LV SV MOD A4C:     101.0 ml LV SV MOD BP:      42.2 ml RIGHT VENTRICLE RV Basal diam:  3.40 cm RV Mid diam:    2.50 cm RV S prime:     10.20 cm/s TAPSE (M-mode): 1.6 cm LEFT ATRIUM             Index        RIGHT ATRIUM           Index LA diam:        3.70 cm 1.73 cm/m   RA Area:     14.80 cm LA Vol (A2C):  38.1 ml 17.84 ml/m  RA Volume:   34.40 ml  16.11 ml/m LA Vol (A4C):   36.3 ml 17.00 ml/m LA Biplane Vol: 38.2 ml 17.89 ml/m  AORTIC VALVE AV Area (Vmax):    2.65 cm AV Area (Vmean):   2.43 cm AV Area (VTI):     2.66 cm AV Vmax:           159.00 cm/s AV Vmean:          107.000 cm/s AV VTI:            0.243 m AV Peak Grad:      10.1 mmHg AV Mean Grad:       5.0 mmHg LVOT Vmax:         134.00 cm/s LVOT Vmean:        82.600 cm/s LVOT VTI:          0.206 m LVOT/AV VTI ratio: 0.85  AORTA Ao Root diam: 3.40 cm Ao Asc diam:  3.80 cm MITRAL VALVE               TRICUSPID VALVE MV Area (PHT): 3.08 cm    TR Peak grad:   30.7 mmHg MV Decel Time: 246 msec    TR Vmax:        277.00 cm/s MV E velocity: 38.80 cm/s MV A velocity: 74.40 cm/s  SHUNTS MV E/A ratio:  0.52        Systemic VTI:  0.21 m                            Systemic Diam: 2.00 cm Dalton McleanMD Electronically signed by Wilfred Lacy Signature Date/Time: 01/14/2023/10:12:47 AM    Final    NM Pulmonary Perfusion  Result Date: 01/13/2023 CLINICAL DATA:  High probability for pulmonary embolism. EXAM: NUCLEAR MEDICINE PERFUSION LUNG SCAN TECHNIQUE: Perfusion images were obtained in multiple projections after intravenous injection of radiopharmaceutical. RADIOPHARMACEUTICALS:  4.2 mCi Tc-8m MAA COMPARISON:  Chest radiograph 11/12/2022 FINDINGS: Multiple peripheral wedge-shaped perfusion defects in LEFT and RIGHT lung. Medium-size peripheral defect in the anterior LEFT lower lobe on LPO projection Smaller peripheral perfusion defect in the anterior RIGHT upper lobe and superior segment of the RIGHT lower lobe. Comparison chest x-ray is clear. IMPRESSION: Bilateral medium size and small peripheral perfusion defects consistent with acute pulmonary embolism. These results will be called to the ordering clinician or representative by the Radiologist Assistant, and communication documented in the PACS or [employer]. Electronically Signed   By: Genevive Bi M.D.   On: 01/13/2023 15:59    Cardiac Studies   Echo 01/14/23 1. Left ventricular ejection fraction, by estimation, is 25 to 30%. The  left ventricle has severely decreased function. The left ventricle  demonstrates regional wall motion abnormalities with akinesis of the apex  and the peri-apical segments. There is  moderate concentric left ventricular  hypertrophy. Left ventricular  diastolic parameters are consistent with Grade I diastolic dysfunction  (impaired relaxation). Large globular 2 x 2.1 cm apical thrombus present.   2. Right ventricular systolic function is mildly reduced. The right  ventricular size is mildly enlarged. There is normal pulmonary artery  systolic pressure. The estimated right ventricular systolic pressure is  33.7 mmHg.   3. Left atrial size was mildly dilated.   4. The mitral valve is normal in structure. Trivial mitral valve  regurgitation. No evidence of mitral stenosis.   5. The aortic valve is tricuspid. There  is mild calcification of the  aortic valve. Aortic valve regurgitation is not visualized. No aortic  stenosis is present.   6. Aortic dilatation noted. There is mild dilatation of the ascending  aorta, measuring 38 mm.   7. The inferior vena cava is normal in size with greater than 50%  respiratory variability, suggesting right atrial pressure of 3 mmHg.   Right/ Left Cardiac Catheterization 07/27/2021:   Ost RCA to Prox RCA lesion is 100% stenosed.   Prox Cx to Mid Cx lesion is 30% stenosed.   1st Mrg lesion is 20% stenosed.   Prox LAD lesion is 60% stenosed.   Mid LAD lesion is 90% stenosed.   Mid LAD to Dist LAD lesion is 100% stenosed.   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 Continue diuresis. Titrate GDMT. Place PICC. Will need cMRI to assess viability of LAD territory but suspect minimal viability based on echo.    Will discuss management of RCC with IR with possible embolization.    Diagnostic Dominance: Right  _______________   Cardiac MRI 07/28/2021: Impression: Severe  LV dysfunction. LAD and RCA territories with scar due not appear viable.   Large LV thrombus. _______________   Echocardiogram 07/02/2022: Impressions:  1. The left ventricle demonstrates regional wall motion abnormalities  (Apical infarct without LV thrombus, evidence of LAD and RCA infarct, LVEF  25-30%). There is moderate concentric left ventricular hypertrophy. Left  ventricular diastolic parameters  are consistent with Grade I diastolic dysfunction (impaired relaxation).   2. Right ventricular systolic function is normal. The right ventricular  size is normal. There is normal pulmonary artery systolic pressure.   3. Left atrial size was mildly dilated.   4. The mitral valve is normal in structure. Mild mitral valve  regurgitation. No evidence of mitral stenosis.   5. Tricuspid valve regurgitation is mild to moderate.   6. The aortic valve is tricuspid. Aortic valve regurgitation is not  visualized.   7. Aortic dilatation noted. There is mild dilatation of the ascending  aorta, measuring 41 mm.   8. The inferior vena cava is normal in size with greater than 50%  respiratory variability, suggesting right atrial pressure of 3 mmHg.   Comparison(s): Resolution of apical thrombus; study done with contrast.   Patient Profile     72 y.o. male  with a history of severe 2-vessel CAD with CTO of ostial to proximal RCA and mid to distal LAD on cardiac catheterization in 06/2021 (areas not viable on cardiac MRI), ischemic cardiomyopathy with biventricular CHF and LVEF of 25-30% in 06/2022, LV thrombus on Echo in 06/2021 (resolved on last Echo in 06/2022 but remains on Xraelto), dilatated ascending thoracic aorta,  hypertension, hyperlipidemia, CKD stage III, and bilateral renal masses suspicious for renal cell carcinoma  who is being seen 01/12/2023 for the evaluation of acute on chronic ischemic heart failure with scar and LV thrombus.   Assessment & Plan    Acute hypoxic respiratory  failure Acute on chronic BiV CHF ICM - Last echo 06/2022 showed LVEF 25-30% with persistent wall motion abnormalities, G1DD, normal RV - admitted  for planned partial left nephrectomy and has required supplemental O2 since then - CXR with small left pleural effusion  - s/p IV lasix - Net -1.6L - PTA entresto and spiro held on admission - continue toprol and Jardiance - repeat echo ordered showed LVEF 25-30%, WMA with akinesis of the apex and per-apical segments. Moderate LVH with G1DD, mildly reduced RVSF, trivial MR - patient appears euvolemic on exam, has been on O2  --> renal function has improved, no changes in his respiratory status. Crt 2.69->2.94->2.18. Will hold diuretics over the weekend --> can uptitrate GDMT if renal fxn continues to improve. Can plan for low dose jardiance, entresto, and spironoalctone  CAD - h/o 2V CAD with CTO of RCA and LAD on cardiac cath in 06/2021 - cMRI at that time showed scar in the RCA and LAD territories - no chest pain reported - continue ASA, BB and statin  LV thrombus - noted on echo 06/2021. Last echo 06/2022 showed complete resolution of thrombus, but remained on Xarelto - Xarelto held for nephrectomy - repeat echo showed recurrent LV thrombus> on IV heparin - plan to restart Xarelto 15mg  daily; see dosing below  Provoked PE -Lung perfusion scan shows multiple defects c/w acute pulmonary embolism.  - recommended lifelong AC with LV thrombus with apical scar   For the first 21 days, 15 mg twice daily with food; Starting at day 22, change to 20 mg once daily  After 3 months would switch to the dose for intracardiac thrombus prevention (adjusted for renal dysfunction), 15 mg daily, indefinitely.  CKD stage 3 - Scr 1.38 on admission - Baseline Scr 1.4-1.6 - kidney function continues to worsen, not on nephrotoxic meds - continue to trend kidney function  Will follow peripherally and introduce GDMT as tolerated.  For questions or updates,  please contact [facility] Please consult [email or web address] for contact info under        Signed, Maisie Fus, MD  01/15/2023, 9:19 AM

## 2023-01-15 NOTE — Progress Notes (Signed)
Mobility Specialist - Progress Note   01/15/23 1506  Mobility  Activity Ambulated with assistance in hallway  Level of Assistance Standby assist, set-up cues, supervision of patient - no hands on  Assistive Device Front wheel walker  Distance Ambulated (ft) 250 ft  Activity Response Tolerated well  Mobility Referral Yes  $Mobility charge 1 Mobility   Pt received in recliner and agreeable to mobility. No complaints during session. Pt to recliner after session with all needs met.    [facility]

## 2023-01-15 NOTE — Progress Notes (Signed)
ANTICOAGULATION CONSULT NOTE - follow up  Pharmacy Consult for Heparin Indication:  new PE  No Known Allergies  Patient Measurements: Height: 5\' 10"  (177.8 cm) Weight: 95.7 kg (211 lb) IBW/kg (Calculated) : 73 Heparin Dosing Weight:  92.4 kg  Vital Signs: Temp Source: Oral (05/17 2000) BP: 137/74 (05/17 2000)  Labs: Recent Labs    01/12/23 0801 01/13/23 0415 01/14/23 0116 01/14/23 1051 01/15/23 0413  HGB 8.5*  --  9.0*  --  7.8*  HCT 27.3*  --  28.0*  --  24.7*  PLT 120*  --  181  --  211  HEPARINUNFRC  --   --  0.80* 0.62 0.40  CREATININE 2.24* 2.69* 2.94*  --   --      Estimated Creatinine Clearance: 26.4 mL/min (A) (by C-G formula based on SCr of 2.94 mg/dL (H)).   Medical History: Past Medical History:  Diagnosis Date   Ascending aortic aneurysm (HCC)    Bilateral renal masses    CAD (coronary artery disease)    CHF (congestive heart failure) (HCC)    HLD (hyperlipidemia)    Hypertension    LV (left ventricular) mural thrombus    Systolic heart failure (HCC)     Assessment: AC/Heme: PTA Xarelto for LV mural thrombus held (LD 5/6). Urology Surgery 5/10 -SCDs ordered post-op 5/11: Left upper port site oozing sanguineous drainage. JP site dressing saturated. Hgb drop to 10.5. 5/12: Hgb down to 8.5, Plts 179>89? Dressings dry today 5/13: Hgb 8.2 > 9.2, drain output slowed significantly  5/14: Hgb down to 8.5, Plt low but improved to 100 5/15: Hgb stable at 8.5. Plt low but improved to 120. MD started SQ heparin q12h 5/16: VQ scan: New BL PE. No CBC done today  01/15/2023 HL 0.4 therapeutic on 1550 units/hr Hgb down 7.8, plts WNL No bleeding noted   Goal of Therapy:  Heparin level 0.3-0.7 units/ml Monitor platelets by anticoagulation protocol: Yes   Plan:  Continue heparin drip at 1550 units/hr Daily HL and CBC Await word on plans for transitioning back to PO Xarelto.    Arley Phenix RPh 01/15/2023, 5:17 AM

## 2023-01-15 NOTE — Progress Notes (Signed)
Progress NOTE    Troy May  ZOX:[id number] DOB: [birth year] DOA: 01/07/2023 PCP: Georgina Quint, MD  No chief complaint on file.   Brief Narrative:   Troy May is Troy May 72 y.o. male with past medical history of left ventricle mural thrombus, ascending aortic aneurysm, chronic combined systolic and diastolic heart failure, CAD, bilateral renal masses, hyperlipidemia who we are being consulted for dyspnea and new oxygen requirement. He denied fever, chills, rhinorrhea, sore throat, wheezing or hemoptysis.  No chest pain, palpitations, diaphoresis, PND, passout positive orthopnea, lower extremities and bilateral hands edema.  No nausea, emesis, diarrhea, constipation, melena or hematochezia.  No flank pain or dysuria.  No polyuria, polydipsia, polyphagia or blurred vision.   Significant Events 5/10 Robotic assisted left laparoscopic left partial nephrectomy 5/15 [facility] consulted for presumed HF.  Cardiology consulted. 5/16 VQ scan positive for PE 5/17 echo with LV thrombus.  [facility] will take over as primary.  Assessment & Plan:   Principal Problem:   Renal cell carcinoma (HCC) Active Problems:   Essential hypertension   AKI (acute kidney injury) (HCC)   Acute combined systolic and diastolic heart failure (HCC)   Left ventricular apical thrombus   Coronary artery disease of native artery of native heart with stable angina pectoris (HCC)   Dyslipidemia   Renal mass   Type 2 diabetes mellitus (HCC)   Biventricular heart failure (HCC)   Paroxysmal atrial fibrillation (HCC)   Acute on chronic combined systolic and diastolic CHF (congestive heart failure) (HCC)   Acute pulmonary embolism (HCC)  Acute Hypoxic Respiratory Failure  Pulmonary Embolism  Left Leg DVT Due to PE Echo as below, mildly reduced RVSF Acute DVT involving L gastrocnemius vein, acute thrombus involving Troy May perforator vein of the calf  Currently on heparin gtt Wean o2 as tolerated - I was able to wean to 1 L at bedside  today Home o2 screen   Renal mass Left renal mass Will need urology follow up outpatient   Acute on chronic combined systolic and diastolic heart failure (HCC) Echo with EF 25-30%, grade 1 diastolic dysfunction Repeat echo with EF 25-30%, regional wall motion abnormalities with akinesis of the apex and periapical segments, grade 1 diastolic dysfunction, large globular 2x2.1 cm apical thrombus.  RVSF mildly reduced.    Appreciate cards recs - HF meds on hold with AKI  I/O, daily weights Appreciate cards recs  AKI (acute kidney injury) (HCC) AKI peaked at 2.94, finally improving today 2.18 Will monitor, baseline creatinine 1.3-1.4 Renal US without hydro - medical renal disease - solid right renal mass measuring 5.4x5.6x4.7 Strict I/O, daily weights   Essential hypertension Continue metoprolol succinate 50 mg p.o. daily. Entresto on hold, spironolactone on hold   Left ventricular apical thrombus Xarelto was held prior to procedure. Echo with large globular apical thrombus Currently on heparin gtt, appreciate cardiology recs   Coronary artery disease of native artery of  native heart with stable angina pectoris (HCC) On beta-blocker. Statin, aspirin and DOAC on hold.  Currently on heparin gtt.   Dyslipidemia Atorvastatin currently being held.   Type 2 diabetes mellitus (HCC)  Jardiance on hold A1c 6    DVT prophylaxis: heparin Code Status: full Family Communication: wife at bedside Disposition:   Status is: Inpatient Remains inpatient appropriate because: worsening creatinine   Urology is Primary Cardiology consulting [facility] consulting  Procedures:  Robotic assisted laparoscopic left partial nephrectomy   Antimicrobials:  Anti-infectives (From admission, onward)    Start  Dose/Rate Route Frequency Ordered Stop   01/07/23 1630  ceFAZolin (ANCEF) IVPB 1 g/50 mL premix        1 g 100 mL/hr over 30 Minutes Intravenous Every 8 hours 01/07/23 1602 01/08/23 0038    01/07/23 0529  ceFAZolin (ANCEF) IVPB 2g/100 mL premix        2 g 200 mL/hr over 30 Minutes Intravenous 30 min pre-op 01/07/23 0529 01/07/23 0818       Subjective: No new complaints Wife at bedside - expresses frustration   Objective: Vitals:   01/15/23 0100 01/15/23 0300 01/15/23 0400 01/15/23 0500  BP:   122/73   Pulse: 69 79 66 65  Resp: 16 16 16 11   Temp:      TempSrc:      SpO2: 98% 99% 98% 97%  Weight:      Height:        Intake/Output Summary (Last 24 hours) at 01/15/2023 1047 Last data filed at 01/15/2023 0924 Gross per 24 hour  Intake 736.53 ml  Output 750 ml  Net -13.47 ml   Filed Weights   01/07/23 0606 01/07/23 1459 01/13/23 0500  Weight: 92 kg 92.9 kg 95.7 kg    Examination:  General: No acute distress. Cardiovascular: RRR Lungs: Clear to auscultation bilaterally - On 4 L initially, weaned to 1 L by time I left the room, sats in mid 90's Abdomen: Soft, nontender, nondistended Neurological: Alert and oriented 3. Moves all extremities 4 with equal strength. Cranial nerves II through XII grossly intact. Extremities: No clubbing or cyanosis. No edema.   Data Reviewed: I have personally reviewed following labs and imaging studies  CBC: Recent Labs  Lab 01/09/23 0709 01/09/23 1555 01/10/23 1128 01/11/23 0819 01/12/23 0801 01/14/23 0116 01/15/23 0413  WBC 7.8  --   --  7.5 7.8 9.3 8.4  NEUTROABS  --   --   --   --  5.4  --   --   HGB 8.8*   < > 9.2* 8.5* 8.5* 9.0* 7.8*  HCT 27.0*   < > 29.3* 27.2* 27.3* 28.0* 24.7*  MCV 88.5  --   --  90.4 90.4 89.5 90.8  PLT 89*  --   --  100* 120* 181 211   < > = values in this interval not displayed.    Basic Metabolic Panel: Recent Labs  Lab 01/11/23 0819 01/12/23 0801 01/13/23 0415 01/14/23 0116 01/15/23 0413  NA 130* 131* 130* 134* 137  K 3.9 3.9 4.1 4.2 4.3  CL 103 101 98 100 104  CO2 19* 21* 22 24 25   GLUCOSE 136* 114* 111* 124* 106*  BUN 21 30* 31* 38* 32*  CREATININE 1.80* 2.24* 2.69*  2.94* 2.18*  CALCIUM 7.8* 8.0* 8.0* 8.3* 7.9*    GFR: Estimated Creatinine Clearance: 35.6 mL/min (Troy May) (by C-G formula based on SCr of 2.18 mg/dL (H)).  Liver Function Tests: No results for input(s): "AST", "ALT", "ALKPHOS", "BILITOT", "PROT", "ALBUMIN" in the last 168 hours.  CBG: Recent Labs  Lab 01/11/23 1701 01/11/23 2034 01/11/23 2359 01/12/23 0421 01/12/23 0745  GLUCAP 134* 148* 108* 109* 110*     No results found for this or any previous visit (from the past 240 hour(s)).       Radiology Studies: VAS [place] LOWER EXTREMITY VENOUS (DVT)  Result Date: 01/14/2023  Lower Venous DVT Study Patient Name:  Troy May  Date of Exam:   01/14/2023 Medical Rec #: [id number]   Accession #:  [id number] Date of Birth: 03-18-1951   Patient Gender: M Patient Age:   44 years Exam Location:  [facility] Procedure:      VAS [place] LOWER EXTREMITY VENOUS (DVT) Referring Phys: Troy May --------------------------------------------------------------------------------  Indications: Pulmonary embolism.  Comparison Study: No previous exams Performing Technologist: Troy May, Troy May  Examination Guidelines: Cherissa Hook complete evaluation includes B-mode imaging, spectral Doppler, color Doppler, and power Doppler as needed of all accessible portions of each vessel. Bilateral testing is considered an integral part of Ameila Weldon complete examination. Limited examinations for reoccurring indications may be performed as noted. The reflux portion of the exam is performed with the patient in reverse Trendelenburg.  +---------+---------------+---------+-----------+----------+--------------+ RIGHT    CompressibilityPhasicitySpontaneityPropertiesThrombus Aging +---------+---------------+---------+-----------+----------+--------------+ CFV      Full           Yes      Yes                                 +---------+---------------+---------+-----------+----------+--------------+ SFJ      Full                                                         +---------+---------------+---------+-----------+----------+--------------+ FV Prox  Full           Yes      Yes                                 +---------+---------------+---------+-----------+----------+--------------+ FV Mid   Full           Yes      Yes                                 +---------+---------------+---------+-----------+----------+--------------+ FV DistalFull           Yes      Yes                                 +---------+---------------+---------+-----------+----------+--------------+ PFV      Full                                                        +---------+---------------+---------+-----------+----------+--------------+ POP      Full           Yes      Yes                                 +---------+---------------+---------+-----------+----------+--------------+ PTV      Full                                                        +---------+---------------+---------+-----------+----------+--------------+ PERO     Full                                                        +---------+---------------+---------+-----------+----------+--------------+   +----------+---------------+---------+-----------+----------+--------------+  LEFT      CompressibilityPhasicitySpontaneityPropertiesThrombus Aging +----------+---------------+---------+-----------+----------+--------------+ CFV       Full           Yes      Yes                                 +----------+---------------+---------+-----------+----------+--------------+ SFJ       Full                                                        +----------+---------------+---------+-----------+----------+--------------+ FV Prox   Full           Yes      Yes                                 +----------+---------------+---------+-----------+----------+--------------+ FV Mid    Full           Yes      Yes                                  +----------+---------------+---------+-----------+----------+--------------+ FV Distal Full           Yes      Yes                                 +----------+---------------+---------+-----------+----------+--------------+ PFV       Full                                                        +----------+---------------+---------+-----------+----------+--------------+ POP       Full           Yes      Yes                                 +----------+---------------+---------+-----------+----------+--------------+ Gastroc   None           No       No                   Acute          +----------+---------------+---------+-----------+----------+--------------+ PerforatorNone           No       No                   Acute          +----------+---------------+---------+-----------+----------+--------------+     Summary: BILATERAL: -No evidence of popliteal cyst, bilaterally. RIGHT: - There is no evidence of deep vein thrombosis in the lower extremity.  LEFT: - Findings consistent with acute deep vein thrombosis involving the left gastrocnemius veins. - Findings consistent with acute thrombosis involving Troy May perforator vein of the calf.  *See table(s) above for measurements and observations. Electronically signed by Troy May on 01/14/2023 at 2:54:42 PM.    Final    US RENAL  Result  Date: 01/14/2023 CLINICAL DATA:  [id number] AKI (acute kidney injury) (HCC) [id number] EXAM: RENAL / URINARY TRACT ULTRASOUND COMPLETE COMPARISON:  MRI 11/10/2022 FINDINGS: Right Kidney: Renal measurements: 10.8 x 5.2 x 4.9 cm = volume: 145.3 mL. No hydronephrosis. Increased renal echogenicity. There is Zannah Melucci solid mass measuring 5.4 x 5.6 x 4.7 cm, as seen on prior MRI. Left Kidney: Renal measurements: 12.4 x 5.6 x 4.5 cm = volume: 161.3 mL. No hydronephrosis. Increased renal cortical echogenicity. Poorly visualized due to body habitus. History of prior partial left nephrectomy. Bladder: Appears normal for degree of  bladder distention. Other: None. IMPRESSION: No hydronephrosis. Increased renal cortical echogenicity bilaterally, as can be seen in medical renal disease. Solid right renal mass measuring 5.4 x 5.6 x 4.7 cm, as seen on prior MRI, compatible with renal neoplasm. Electronically Signed   By: Caprice Renshaw M.D.   On: 01/14/2023 12:03   ECHOCARDIOGRAM COMPLETE  Result Date: 01/14/2023    ECHOCARDIOGRAM REPORT   Patient Name:   Troy May Date of Exam: 01/14/2023 Medical Rec #:  [id number]  Height:       70.0 in Accession #:    [id number] Weight:       211.0 lb Date of Birth:  10/29/1950  BSA:          2.135 m Patient Age:    72 years   BP:           122/82 mmHg Patient Gender: M          HR:           84 bpm. Exam Location:  Inpatient Procedure: 2D Echo, 3D Echo, Color Doppler, Cardiac Doppler and Intracardiac            Opacification Agent REPORT CONTAINS CRITICAL RESULT Indications:    Pulmonary Embolus  History:        Patient has prior history of Echocardiogram examinations, most                 recent 07/02/2022. CHF, CAD, AKI, Arrythmias:Atrial Fibrillation;                 Risk Factors:Hypertension, Dyslipidemia and Diabetes. H/o apical                 thrombus (off Xarelto since 5/6 for renal mass removal on 5/10).  Sonographer:    Milbert Coulter Referring Phys: [id number] Thera Basden CALDWELL POWELL May IMPRESSIONS  1. Left ventricular ejection May, by estimation, is 25 to 30%. The left ventricle has severely decreased function. The left ventricle demonstrates regional wall motion abnormalities with akinesis of the apex and the peri-apical segments. There is moderate concentric left ventricular hypertrophy. Left ventricular diastolic parameters are consistent with Grade I diastolic dysfunction (impaired relaxation). Large globular 2 x 2.1 cm apical thrombus present.  2. Right ventricular systolic function is mildly reduced. The right ventricular size is mildly enlarged. There is normal pulmonary artery systolic pressure.  The estimated right ventricular systolic pressure is 33.7 mmHg.  3. Left atrial size was mildly dilated.  4. The mitral valve is normal in structure. Trivial mitral valve regurgitation. No evidence of mitral stenosis.  5. The aortic valve is tricuspid. There is mild calcification of the aortic valve. Aortic valve regurgitation is not visualized. No aortic stenosis is present.  6. Aortic dilatation noted. There is mild dilatation of the ascending aorta, measuring 38 mm.  7. The inferior vena cava is normal in size with greater than 50% respiratory variability, suggesting right  atrial pressure of 3 mmHg. FINDINGS  Left Ventricle: Left ventricular ejection May, by estimation, is 25 to 30%. The left ventricle has severely decreased function. The left ventricle demonstrates regional wall motion abnormalities. Definity contrast agent was given IV to delineate the left ventricular endocardial borders. The left ventricular internal cavity size was normal in size. There is moderate concentric left ventricular hypertrophy. Left ventricular diastolic parameters are consistent with Grade I diastolic dysfunction (impaired relaxation). Right Ventricle: The right ventricular size is mildly enlarged. No increase in right ventricular wall thickness. Right ventricular systolic function is mildly reduced. There is normal pulmonary artery systolic pressure. The tricuspid regurgitant velocity  is 2.77 m/s, and with an assumed right atrial pressure of 3 mmHg, the estimated right ventricular systolic pressure is 33.7 mmHg. Left Atrium: Left atrial size was mildly dilated. Right Atrium: Right atrial size was normal in size. Pericardium: There is no evidence of pericardial effusion. Mitral Valve: The mitral valve is normal in structure. Trivial mitral valve regurgitation. No evidence of mitral valve stenosis. Tricuspid Valve: The tricuspid valve is normal in structure. Tricuspid valve regurgitation is trivial. Aortic Valve: The aortic  valve is tricuspid. There is mild calcification of the aortic valve. Aortic valve regurgitation is not visualized. No aortic stenosis is present. Aortic valve mean gradient measures 5.0 mmHg. Aortic valve peak gradient measures 10.1 mmHg. Aortic valve area, by VTI measures 2.66 cm. Pulmonic Valve: The pulmonic valve was normal in structure. Pulmonic valve regurgitation is not visualized. Aorta: Aortic dilatation noted. There is mild dilatation of the ascending aorta, measuring 38 mm. Venous: The inferior vena cava is normal in size with greater than 50% respiratory variability, suggesting right atrial pressure of 3 mmHg. IAS/Shunts: No atrial level shunt detected by color flow Doppler. Additional Comments: Troy May is visualized in the right ventricle.  LEFT VENTRICLE PLAX 2D LVIDd:         5.30 cm      Diastology LVIDs:         4.10 cm      LV e' medial:    4.24 cm/s LV PW:         1.10 cm      LV E/e' medial:  9.2 LV IVS:        1.10 cm      LV e' lateral:   8.16 cm/s LVOT diam:     2.00 cm      LV E/e' lateral: 4.8 LV SV:         65 LV SV Index:   30 LVOT Area:     3.14 cm  LV Volumes (MOD) LV vol d, MOD A2C: 117.0 ml LV vol d, MOD A4C: 101.0 ml LV vol s, MOD A2C: 72.9 ml LV vol s, MOD A4C: 65.5 ml LV SV MOD A2C:     44.1 ml LV SV MOD A4C:     101.0 ml LV SV MOD BP:      42.2 ml RIGHT VENTRICLE RV Basal diam:  3.40 cm RV Mid diam:    2.50 cm RV S prime:     10.20 cm/s TAPSE (M-mode): 1.6 cm LEFT ATRIUM             Index        RIGHT ATRIUM           Index LA diam:        3.70 cm 1.73 cm/m   RA Area:     14.80 cm LA Vol (A2C):  38.1 ml 17.84 ml/m  RA Volume:   34.40 ml  16.11 ml/m LA Vol (A4C):   36.3 ml 17.00 ml/m LA Biplane Vol: 38.2 ml 17.89 ml/m  AORTIC VALVE AV Area (Vmax):    2.65 cm AV Area (Vmean):   2.43 cm AV Area (VTI):     2.66 cm AV Vmax:           159.00 cm/s AV Vmean:          107.000 cm/s AV VTI:            0.243 m AV Peak Grad:      10.1 mmHg AV Mean Grad:      5.0 mmHg LVOT Vmax:          134.00 cm/s LVOT Vmean:        82.600 cm/s LVOT VTI:          0.206 m LVOT/AV VTI ratio: 0.85  AORTA Ao Root diam: 3.40 cm Ao Asc diam:  3.80 cm MITRAL VALVE               TRICUSPID VALVE MV Area (PHT): 3.08 cm    TR Peak grad:   30.7 mmHg MV Decel Time: 246 msec    TR Vmax:        277.00 cm/s MV E velocity: 38.80 cm/s MV Aaniya Sterba velocity: 74.40 cm/s  SHUNTS MV E/Lailoni Baquera ratio:  0.52        Systemic VTI:  0.21 m                            Systemic Diam: 2.00 cm Dalton McleanMD Electronically signed by Wilfred Lacy Signature Date/Time: 01/14/2023/10:12:47 AM    Final    NM Pulmonary Perfusion  Result Date: 01/13/2023 CLINICAL DATA:  High probability for pulmonary embolism. EXAM: NUCLEAR MEDICINE PERFUSION LUNG SCAN TECHNIQUE: Perfusion images were obtained in multiple projections after intravenous injection of radiopharmaceutical. RADIOPHARMACEUTICALS:  4.2 mCi Tc-10m MAA COMPARISON:  Chest radiograph 11/12/2022 FINDINGS: Multiple peripheral wedge-shaped perfusion defects in LEFT and RIGHT lung. Medium-size peripheral defect in the anterior LEFT lower lobe on LPO projection Smaller peripheral perfusion defect in the anterior RIGHT upper lobe and superior segment of the RIGHT lower lobe. Comparison chest x-ray is clear. IMPRESSION: Bilateral medium size and small peripheral perfusion defects consistent with acute pulmonary embolism. These results will be called to the ordering clinician or representative by the Radiologist Assistant, and communication documented in the PACS or [employer]. Electronically Signed   By: Genevive Bi M.D.   On: 01/13/2023 15:59        Scheduled Meds:  docusate sodium  100 mg Oral BID   metoprolol succinate  50 mg Oral QHS   Continuous Infusions:  heparin 1,550 Units/hr (01/14/23 2050)     LOS: 8 days    Time spent: over 30 min    Lacretia Nicks, MD [facility]   To contact the attending provider between 7A-7P or the covering provider during  after hours 7P-7A, please log into the web site [email or web address] and access using universal [place] password for that web site. If you do not have the password, please call the hospital operator.  01/15/2023, 10:47 AM

## 2023-01-15 NOTE — Evaluation (Signed)
Physical Therapy Evaluation Patient Details Name: Troy May MRN: [id number] DOB: [birth year] Today's Date: 01/15/2023  History of Present Illness  Pt is a 72yo male presenting s/p L partial nephrectomy on 01/07/23 secondary to a renal mass, on IV heparin for bilateral PE via VQ scan on 5/16.  PMH: CAD s/p cath, CHF, HLD, HTN,   Clinical Impression  Patient evaluated by Physical Therapy with no further acute PT needs identified. All education has been completed and the patient has no further questions. Pt exhibited functional strength in all extremities and reported no history of falls or need for assistance with ADLs. Pt demonstrated modified independence with transfers and SUP for ambulation in hallway for O2 saturation test (see previous note). During ambulation pt required 3LO2 via [place] to maintain 88-92% SpO2 as monitored via telebox, though of note pleth was poor and pt required regular verbal cues for pursed lip breathing and to refrain from loquaciousness. Other than desaturation pt and wife feel that he is at his baseline. PT is signing off. Thank you for this referral.        Recommendations for follow up therapy are one component of a multi-disciplinary discharge planning process, led by the attending physician.  Recommendations may be updated based on patient status, additional functional criteria and insurance authorization.  Follow Up Recommendations       Assistance Recommended at Discharge Set up Supervision/Assistance  Patient can return home with the following  A little help with walking and/or transfers;A little help with bathing/dressing/bathroom;Assistance with cooking/housework;Assist for transportation;Help with stairs or ramp for entrance    Equipment Recommendations None recommended by PT  Recommendations for Other Services       Functional Status Assessment Patient has had a recent decline in their functional status and demonstrates the ability to make significant  improvements in function in a reasonable and predictable amount of time.     Precautions / Restrictions Precautions Precautions: Fall Restrictions Weight Bearing Restrictions: No      Mobility  Bed Mobility               General bed mobility comments: OOB in recliner at entry and exit    Transfers Overall transfer level: Modified independent Equipment used: None               General transfer comment: Increased time    Ambulation/Gait Ambulation/Gait assistance: Supervision Gait Distance (Feet): 250 Feet Assistive device: None Gait Pattern/deviations: WFL(Within Functional Limits) Gait velocity: decreased     General Gait Details: Pt ambulated without AD 260ft with multiple standing rest breaks to recover O2, no overt LOB ntoed or physical assist required, pt on varying levels of O2 as pt completeing saturation testing, monitored closely via telebox.  Stairs            Wheelchair Mobility    Modified Rankin (Stroke Patients Only)       Balance                                             Pertinent Vitals/Pain Pain Assessment Pain Assessment: No/denies pain    Home Living Family/patient expects to be discharged to:: Private residence Living Arrangements: Spouse/significant other Available Help at Discharge: Family;Available 24 hours/day Type of Home: House Home Access: Stairs to enter Entrance Stairs-Rails: Right;Left;Can reach both Entrance Stairs-Number of Steps: 4 Alternate Level Stairs-Number of Steps: 6+6 split  level Home Layout: Multi-level Home Equipment: None Additional Comments: Wife Aram Beecham shared that every week from Sunday-Thursday they travel to [place] to care for their grandchildren and their house setup is similar with stairs to enter, handrail, and stairs to a 2nd floor bedroom.    Prior Function Prior Level of Function : Independent/Modified Independent;Driving             Mobility Comments:  ind ADLs Comments: ind     Hand Dominance        Extremity/Trunk Assessment   Upper Extremity Assessment Upper Extremity Assessment: Overall WFL for tasks assessed    Lower Extremity Assessment Lower Extremity Assessment: Overall WFL for tasks assessed    Cervical / Trunk Assessment Cervical / Trunk Assessment: Normal  Communication   Communication: No difficulties  Cognition Arousal/Alertness: Awake/alert Behavior During Therapy: WFL for tasks assessed/performed Overall Cognitive Status: Within Functional Limits for tasks assessed                                          General Comments General comments (skin integrity, edema, etc.): Wife Aram Beecham present    Exercises     Assessment/Plan    PT Assessment All further PT needs can be met in the next venue of care  PT Problem List Decreased activity tolerance       PT Treatment Interventions      PT Goals (Current goals can be found in the Care Plan section)  Acute Rehab PT Goals Patient Stated Goal: To get back to caring for his grandchildren. PT Goal Formulation: With patient Time For Goal Achievement: 01/29/23 Potential to Achieve Goals: Good    Frequency       Co-evaluation               AM-PAC PT "6 Clicks" Mobility  Outcome Measure Help needed turning from your back to your side while in a flat bed without using bedrails?: None Help needed moving from lying on your back to sitting on the side of a flat bed without using bedrails?: None Help needed moving to and from a bed to a chair (including a wheelchair)?: None Help needed standing up from a chair using your arms (e.g., wheelchair or bedside chair)?: A Little Help needed to walk in hospital room?: A Little Help needed climbing 3-5 steps with a railing? : A Lot 6 Click Score: 20    End of Session Equipment Utilized During Treatment: Oxygen;Gait belt Activity Tolerance: Patient limited by fatigue (Oxygen) Patient left:  in chair;with call bell/phone within reach;with family/visitor present Nurse Communication: Mobility status;Other (comment) (results of O2 walking test.) PT Visit Diagnosis: Difficulty in walking, not elsewhere classified (R26.2)    Time: 8295-6213 PT Time Calculation (min) (ACUTE ONLY): 48 min   Charges:   PT Evaluation $PT Eval Low Complexity: 1 Low PT Treatments $Gait Training: 23-37 mins        Jamesetta Geralds, PT, DPT [facility] Office: [phone]  Jamesetta Geralds 01/15/2023, 1:41 PM

## 2023-01-15 NOTE — Progress Notes (Signed)
SATURATION QUALIFICATIONS: (This note is used to comply with regulatory documentation for home oxygen)  Patient Saturations on Room Air at Rest = 88%  Patient Saturations on Room Air while Ambulating = 70%  Patient Saturations on 3 Liters of oxygen while Ambulating = 92%  Please briefly explain why patient needs home oxygen: desaturating during mobility.  Jamesetta Geralds, PT, DPT [facility] Office: [phone]

## 2023-01-16 ENCOUNTER — Inpatient Hospital Stay (HOSPITAL_COMMUNITY): Payer: Medicare Other

## 2023-01-16 DIAGNOSIS — C649 Malignant neoplasm of unspecified kidney, except renal pelvis: Secondary | ICD-10-CM | POA: Diagnosis not present

## 2023-01-16 DIAGNOSIS — I5082 Biventricular heart failure: Secondary | ICD-10-CM | POA: Diagnosis not present

## 2023-01-16 DIAGNOSIS — I5043 Acute on chronic combined systolic (congestive) and diastolic (congestive) heart failure: Secondary | ICD-10-CM | POA: Diagnosis not present

## 2023-01-16 DIAGNOSIS — N2889 Other specified disorders of kidney and ureter: Secondary | ICD-10-CM | POA: Diagnosis not present

## 2023-01-16 DIAGNOSIS — I2510 Atherosclerotic heart disease of native coronary artery without angina pectoris: Secondary | ICD-10-CM | POA: Diagnosis not present

## 2023-01-16 LAB — BASIC METABOLIC PANEL
Anion gap: 10 (ref 5–15)
BUN: 28 mg/dL — ABNORMAL HIGH (ref 8–23)
CO2: 25 mmol/L (ref 22–32)
Calcium: 8.2 mg/dL — ABNORMAL LOW (ref 8.9–10.3)
Chloride: 102 mmol/L (ref 98–111)
Creatinine, Ser: 1.76 mg/dL — ABNORMAL HIGH (ref 0.61–1.24)
GFR, Estimated: 41 mL/min — ABNORMAL LOW (ref >60)
Glucose, Bld: 108 mg/dL — ABNORMAL HIGH (ref 70–99)
Potassium: 4.7 mmol/L (ref 3.5–5.1)
Sodium: 137 mmol/L (ref 135–145)

## 2023-01-16 LAB — CBC
HCT: 27.8 % — ABNORMAL LOW (ref 39.0–52.0)
Hemoglobin: 8.6 g/dL — ABNORMAL LOW (ref 13.0–17.0)
MCH: 28.2 pg (ref 26.0–34.0)
MCHC: 30.9 g/dL (ref 30.0–36.0)
MCV: 91.1 fL (ref 80.0–100.0)
Platelets: 223 10*3/uL (ref 150–400)
RBC: 3.05 MIL/uL — ABNORMAL LOW (ref 4.22–5.81)
RDW: 17.1 % — ABNORMAL HIGH (ref 11.5–15.5)
WBC: 7.2 10*3/uL (ref 4.0–10.5)
nRBC: 0.4 % — ABNORMAL HIGH (ref 0.0–0.2)

## 2023-01-16 LAB — HEPARIN LEVEL (UNFRACTIONATED): Heparin Unfractionated: 0.44 IU/mL (ref 0.30–0.70)

## 2023-01-16 MED ORDER — ORAL CARE MOUTH RINSE: 15.0000 mL | OROMUCOSAL | Status: DC | PRN

## 2023-01-16 MED ORDER — RIVAROXABAN 20 MG PO TABS: 20.0000 mg | ORAL_TABLET | Freq: Every day | ORAL | Status: DC

## 2023-01-16 MED ORDER — RIVAROXABAN 15 MG PO TABS
15.0000 mg | ORAL_TABLET | Freq: Two times a day (BID) | ORAL | Status: DC
  Administered 2023-01-16 – 2023-01-18 (×6): 15 mg via ORAL
  Filled 2023-01-16 (×6): qty 1

## 2023-01-16 NOTE — Progress Notes (Signed)
Pt's urine color has turned to red (dark red) again since shift change. Pt' urine color was amber during day shift per charting. Pt doesn't complain of any pain or burning. Will continue to monitor.

## 2023-01-16 NOTE — Progress Notes (Signed)
ANTICOAGULATION CONSULT NOTE - Follow Up Consult  Pharmacy Consult for heparin --> xarelto  Indication: acute pulmonary embolus and DVT; hx LV thrombus  No Known Allergies  Patient Measurements: Height: 5\' 10"  (177.8 cm) Weight: 95 kg (209 lb 7 oz) IBW/kg (Calculated) : 73 Heparin Dosing Weight:   Vital Signs: Temp: 98.9 F (37.2 C) (05/19 0500) Temp Source: Oral (05/19 0500) BP: 149/92 (05/19 0500)  Labs: Recent Labs    01/14/23 0116 01/14/23 1051 01/15/23 0413 01/15/23 1252 01/16/23 0408  HGB 9.0*  --  7.8* 8.3* 8.6*  HCT 28.0*  --  24.7* 26.7* 27.8*  PLT 181  --  211  --  223  HEPARINUNFRC 0.80* 0.62 0.40  --  0.44  CREATININE 2.94*  --  2.18*  --  1.76*    Estimated Creatinine Clearance: 43.9 mL/min (A) (by C-G formula based on SCr of 1.76 mg/dL (H)).   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  Heparin 5000 units SQ q12h started postop on 01/12/23.  On 01/13/23, he had SOB with V/Q scan showed acute bilateral PE. LE doppler on 01/14/23 was positive for acute LLE DVT. He was started on heparin drip on 01/13/23 for VTE treatment.  Pharmacy has been consulted on 01/16/23 to transition anticoag. to xarelto.   Today, 01/16/2023: - heparin level is therapeutic at 0.44 - cbc stable - having intermittent hematuria - scr trending down (crcl~44)  Goal of Therapy:  Heparin level 0.3-0.7 units/ml Monitor platelets by anticoagulation protocol: Yes   Plan:  - hx heparin drip - start xarelto 15 mg bid x21 days, then 20 mg daily - monitor for severity of hematuria  Troy May P 01/16/2023,10:38 AM

## 2023-01-16 NOTE — Progress Notes (Signed)
Urology Inpatient Progress Note  Subjective: Patient had gross hematuria yesterday with brown colored urine.  No dysuria or flank pain. Hematuria improving.  Voiding without difficulty.   Pain well controlled. Continues on heparin gtt.  Anti-infectives: Anti-infectives (From admission, onward)    Start     Dose/Rate Route Frequency Ordered Stop   01/07/23 1630  ceFAZolin (ANCEF) IVPB 1 g/50 mL premix        1 g 100 mL/hr over 30 Minutes Intravenous Every 8 hours 01/07/23 1602 01/08/23 0038   01/07/23 0529  ceFAZolin (ANCEF) IVPB 2g/100 mL premix        2 g 200 mL/hr over 30 Minutes Intravenous 30 min pre-op 01/07/23 0529 01/07/23 0818       Current Facility-Administered Medications  Medication Dose Route Frequency Provider Last Rate Last Admin   docusate sodium (COLACE) capsule 100 mg  100 mg Oral BID Carlus Pavlov, MD   100 mg at 01/15/23 2104   heparin ADULT infusion 100 units/mL (25000 units/284mL)  1,550 Units/hr Intravenous Continuous Maurice March, RPH 15.5 mL/hr at 01/15/23 1904 1,550 Units/hr at 01/15/23 1904   metoprolol succinate (TOPROL-XL) 24 hr tablet 50 mg  50 mg Oral QHS Carlus Pavlov, MD   50 mg at 01/15/23 2104   Oral care mouth rinse  15 mL Mouth Rinse PRN Zigmund Daniel., MD       traMADol Janean Sark) tablet 50-100 mg  50-100 mg Oral Q6H PRN Crist Fat, MD   50 mg at 01/15/23 1143     Objective: Vital signs in last 24 hours: Temp:  [98.4 F (36.9 C)-98.9 F (37.2 C)] 98.9 F (37.2 C) (05/19 0500) Pulse Rate:  [72-78] 78 (05/18 2101) Resp:  [14-21] 15 (05/19 0500) BP: (138-153)/(78-92) 149/92 (05/19 0500) SpO2:  [93 %-100 %] 100 % (05/19 0500) Weight:  [95 kg] 95 kg (05/19 0630)  Intake/Output from previous day: 05/18 0701 - 05/19 0700 In: 480 [P.O.:480] Out: 1700 [Urine:1700] Intake/Output this shift: Total I/O In: -  Out: 950 [Urine:950]  GENERAL APPEARANCE:  patient in bathroom, NAD HEENT:  Atraumatic,  normocephalic, oropharynx clear ABDOMEN:  Soft, non-tender, no masses; incisions intact EXTREMITIES:  Moves all extremities well, without clubbing, cyanosis, or edema NEUROLOGIC:  Alert and oriented x 3,  CN II-XII grossly intact MENTAL STATUS:  appropriate BACK:  Non-tender to palpation, No CVAT SKIN:  Warm, dry, and intact   Lab Results:  Recent Labs    01/15/23 0413 01/15/23 1252 01/16/23 0408  WBC 8.4  --  7.2  HGB 7.8* 8.3* 8.6*  HCT 24.7* 26.7* 27.8*  PLT 211  --  223   BMET Recent Labs    01/15/23 0413 01/16/23 0408  NA 137 137  K 4.3 4.7  CL 104 102  CO2 25 25  GLUCOSE 106* 108*  BUN 32* 28*  CREATININE 2.18* 1.76*  CALCIUM 7.9* 8.2*   PT/INR No results for input(s): "LABPROT", "INR" in the last 72 hours. ABG No results for input(s): "PHART", "HCO3" in the last 72 hours.  Invalid input(s): "PCO2", "PO2"  Studies/Results: VAS [place] LOWER EXTREMITY VENOUS (DVT)  Result Date: 01/14/2023  Lower Venous DVT Study Patient Name:  Troy May  Date of Exam:   01/14/2023 Medical Rec #: [id number]   Accession #:    [id number] Date of Birth: 03-Dec-1950   Patient Gender: M Patient Age:   72 years Exam Location:  [facility] Procedure:      VAS [place] LOWER EXTREMITY  VENOUS (DVT) Referring Phys: A POWELL JR --------------------------------------------------------------------------------  Indications: Pulmonary embolism.  Comparison Study: No previous exams Performing Technologist: Jody Hill RVT, RDMS  Examination Guidelines: A complete evaluation includes B-mode imaging, spectral Doppler, color Doppler, and power Doppler as needed of all accessible portions of each vessel. Bilateral testing is considered an integral part of a complete examination. Limited examinations for reoccurring indications may be performed as noted. The reflux portion of the exam is performed with the patient in reverse Trendelenburg.   +---------+---------------+---------+-----------+----------+--------------+ RIGHT    CompressibilityPhasicitySpontaneityPropertiesThrombus Aging +---------+---------------+---------+-----------+----------+--------------+ CFV      Full           Yes      Yes                                 +---------+---------------+---------+-----------+----------+--------------+ SFJ      Full                                                        +---------+---------------+---------+-----------+----------+--------------+ FV Prox  Full           Yes      Yes                                 +---------+---------------+---------+-----------+----------+--------------+ FV Mid   Full           Yes      Yes                                 +---------+---------------+---------+-----------+----------+--------------+ FV DistalFull           Yes      Yes                                 +---------+---------------+---------+-----------+----------+--------------+ PFV      Full                                                        +---------+---------------+---------+-----------+----------+--------------+ POP      Full           Yes      Yes                                 +---------+---------------+---------+-----------+----------+--------------+ PTV      Full                                                        +---------+---------------+---------+-----------+----------+--------------+ PERO     Full                                                        +---------+---------------+---------+-----------+----------+--------------+   +----------+---------------+---------+-----------+----------+--------------+  LEFT      CompressibilityPhasicitySpontaneityPropertiesThrombus Aging +----------+---------------+---------+-----------+----------+--------------+ CFV       Full           Yes      Yes                                  +----------+---------------+---------+-----------+----------+--------------+ SFJ       Full                                                        +----------+---------------+---------+-----------+----------+--------------+ FV Prox   Full           Yes      Yes                                 +----------+---------------+---------+-----------+----------+--------------+ FV Mid    Full           Yes      Yes                                 +----------+---------------+---------+-----------+----------+--------------+ FV Distal Full           Yes      Yes                                 +----------+---------------+---------+-----------+----------+--------------+ PFV       Full                                                        +----------+---------------+---------+-----------+----------+--------------+ POP       Full           Yes      Yes                                 +----------+---------------+---------+-----------+----------+--------------+ Gastroc   None           No       No                   Acute          +----------+---------------+---------+-----------+----------+--------------+ PerforatorNone           No       No                   Acute          +----------+---------------+---------+-----------+----------+--------------+     Summary: BILATERAL: -No evidence of popliteal cyst, bilaterally. RIGHT: - There is no evidence of deep vein thrombosis in the lower extremity.  LEFT: - Findings consistent with acute deep vein thrombosis involving the left gastrocnemius veins. - Findings consistent with acute thrombosis involving a perforator vein of the calf.  *See table(s) above for measurements and observations. Electronically signed by Gerarda Fraction on 01/14/2023 at 2:54:42 PM.    Final    US RENAL  Result  Date: 01/14/2023 CLINICAL DATA:  [id number] AKI (acute kidney injury) (HCC) [id number] EXAM: RENAL / URINARY TRACT ULTRASOUND COMPLETE COMPARISON:  MRI  11/10/2022 FINDINGS: Right Kidney: Renal measurements: 10.8 x 5.2 x 4.9 cm = volume: 145.3 mL. No hydronephrosis. Increased renal echogenicity. There is a solid mass measuring 5.4 x 5.6 x 4.7 cm, as seen on prior MRI. Left Kidney: Renal measurements: 12.4 x 5.6 x 4.5 cm = volume: 161.3 mL. No hydronephrosis. Increased renal cortical echogenicity. Poorly visualized due to body habitus. History of prior partial left nephrectomy. Bladder: Appears normal for degree of bladder distention. Other: None. IMPRESSION: No hydronephrosis. Increased renal cortical echogenicity bilaterally, as can be seen in medical renal disease. Solid right renal mass measuring 5.4 x 5.6 x 4.7 cm, as seen on prior MRI, compatible with renal neoplasm. Electronically Signed   By: Caprice Renshaw M.D.   On: 01/14/2023 12:03   ECHOCARDIOGRAM COMPLETE  Result Date: 01/14/2023    ECHOCARDIOGRAM REPORT   Patient Name:   Troy May Date of Exam: 01/14/2023 Medical Rec #:  [id number]  Height:       70.0 in Accession #:    [id number] Weight:       211.0 lb Date of Birth:  Oct 29, 1950  BSA:          2.135 m Patient Age:    72 years   BP:           122/82 mmHg Patient Gender: M          HR:           84 bpm. Exam Location:  Inpatient Procedure: 2D Echo, 3D Echo, Color Doppler, Cardiac Doppler and Intracardiac            Opacification Agent REPORT CONTAINS CRITICAL RESULT Indications:    Pulmonary Embolus  History:        Patient has prior history of Echocardiogram examinations, most                 recent 07/02/2022. CHF, CAD, AKI, Arrythmias:Atrial Fibrillation;                 Risk Factors:Hypertension, Dyslipidemia and Diabetes. H/o apical                 thrombus (off Xarelto since 5/6 for renal mass removal on 5/10).  Sonographer:    Milbert Coulter Referring Phys: [phone] A CALDWELL POWELL JR IMPRESSIONS  1. Left ventricular ejection fraction, by estimation, is 25 to 30%. The left ventricle has severely decreased function. The left ventricle demonstrates  regional wall motion abnormalities with akinesis of the apex and the peri-apical segments. There is moderate concentric left ventricular hypertrophy. Left ventricular diastolic parameters are consistent with Grade I diastolic dysfunction (impaired relaxation). Large globular 2 x 2.1 cm apical thrombus present.  2. Right ventricular systolic function is mildly reduced. The right ventricular size is mildly enlarged. There is normal pulmonary artery systolic pressure. The estimated right ventricular systolic pressure is 33.7 mmHg.  3. Left atrial size was mildly dilated.  4. The mitral valve is normal in structure. Trivial mitral valve regurgitation. No evidence of mitral stenosis.  5. The aortic valve is tricuspid. There is mild calcification of the aortic valve. Aortic valve regurgitation is not visualized. No aortic stenosis is present.  6. Aortic dilatation noted. There is mild dilatation of the ascending aorta, measuring 38 mm.  7. The inferior vena cava is normal in size with greater than 50% respiratory variability, suggesting right  atrial pressure of 3 mmHg. FINDINGS  Left Ventricle: Left ventricular ejection fraction, by estimation, is 25 to 30%. The left ventricle has severely decreased function. The left ventricle demonstrates regional wall motion abnormalities. Definity contrast agent was given IV to delineate the left ventricular endocardial borders. The left ventricular internal cavity size was normal in size. There is moderate concentric left ventricular hypertrophy. Left ventricular diastolic parameters are consistent with Grade I diastolic dysfunction (impaired relaxation). Right Ventricle: The right ventricular size is mildly enlarged. No increase in right ventricular wall thickness. Right ventricular systolic function is mildly reduced. There is normal pulmonary artery systolic pressure. The tricuspid regurgitant velocity  is 2.77 m/s, and with an assumed right atrial pressure of 3 mmHg, the  estimated right ventricular systolic pressure is 33.7 mmHg. Left Atrium: Left atrial size was mildly dilated. Right Atrium: Right atrial size was normal in size. Pericardium: There is no evidence of pericardial effusion. Mitral Valve: The mitral valve is normal in structure. Trivial mitral valve regurgitation. No evidence of mitral valve stenosis. Tricuspid Valve: The tricuspid valve is normal in structure. Tricuspid valve regurgitation is trivial. Aortic Valve: The aortic valve is tricuspid. There is mild calcification of the aortic valve. Aortic valve regurgitation is not visualized. No aortic stenosis is present. Aortic valve mean gradient measures 5.0 mmHg. Aortic valve peak gradient measures 10.1 mmHg. Aortic valve area, by VTI measures 2.66 cm. Pulmonic Valve: The pulmonic valve was normal in structure. Pulmonic valve regurgitation is not visualized. Aorta: Aortic dilatation noted. There is mild dilatation of the ascending aorta, measuring 38 mm. Venous: The inferior vena cava is normal in size with greater than 50% respiratory variability, suggesting right atrial pressure of 3 mmHg. IAS/Shunts: No atrial level shunt detected by color flow Doppler. Additional Comments: A device lead is visualized in the right ventricle.  LEFT VENTRICLE PLAX 2D LVIDd:         5.30 cm      Diastology LVIDs:         4.10 cm      LV e' medial:    4.24 cm/s LV PW:         1.10 cm      LV E/e' medial:  9.2 LV IVS:        1.10 cm      LV e' lateral:   8.16 cm/s LVOT diam:     2.00 cm      LV E/e' lateral: 4.8 LV SV:         65 LV SV Index:   30 LVOT Area:     3.14 cm  LV Volumes (MOD) LV vol d, MOD A2C: 117.0 ml LV vol d, MOD A4C: 101.0 ml LV vol s, MOD A2C: 72.9 ml LV vol s, MOD A4C: 65.5 ml LV SV MOD A2C:     44.1 ml LV SV MOD A4C:     101.0 ml LV SV MOD BP:      42.2 ml RIGHT VENTRICLE RV Basal diam:  3.40 cm RV Mid diam:    2.50 cm RV S prime:     10.20 cm/s TAPSE (M-mode): 1.6 cm LEFT ATRIUM             Index        RIGHT  ATRIUM           Index LA diam:        3.70 cm 1.73 cm/m   RA Area:     14.80 cm LA Vol (  A2C):   38.1 ml 17.84 ml/m  RA Volume:   34.40 ml  16.11 ml/m LA Vol (A4C):   36.3 ml 17.00 ml/m LA Biplane Vol: 38.2 ml 17.89 ml/m  AORTIC VALVE AV Area (Vmax):    2.65 cm AV Area (Vmean):   2.43 cm AV Area (VTI):     2.66 cm AV Vmax:           159.00 cm/s AV Vmean:          107.000 cm/s AV VTI:            0.243 m AV Peak Grad:      10.1 mmHg AV Mean Grad:      5.0 mmHg LVOT Vmax:         134.00 cm/s LVOT Vmean:        82.600 cm/s LVOT VTI:          0.206 m LVOT/AV VTI ratio: 0.85  AORTA Ao Root diam: 3.40 cm Ao Asc diam:  3.80 cm MITRAL VALVE               TRICUSPID VALVE MV Area (PHT): 3.08 cm    TR Peak grad:   30.7 mmHg MV Decel Time: 246 msec    TR Vmax:        277.00 cm/s MV E velocity: 38.80 cm/s MV A velocity: 74.40 cm/s  SHUNTS MV E/A ratio:  0.52        Systemic VTI:  0.21 m                            Systemic Diam: 2.00 cm Dalton McleanMD Electronically signed by Wilfred Lacy Signature Date/Time: 01/14/2023/10:12:47 AM    Final      Assessment & Plan: Left renal mass - POD #9 s/p left partial nephrectomy Acute on chronic renal insufficiency - Cr improving Bilateral pulmonary emboli - on heparin gtt Gross hematuria - consistent with old blood; monitoring  Continue current management per Hospital service Disposition per Medicine   Di Kindle, MD 01/16/2023

## 2023-01-16 NOTE — Plan of Care (Signed)
  Problem: Bowel/Gastric: Goal: Gastrointestinal status for postoperative course will improve Outcome: Progressing   Problem: Clinical Measurements: Goal: Ability to maintain clinical measurements within normal limits will improve Outcome: Progressing Goal: Diagnostic test results will improve Outcome: Progressing Goal: Respiratory complications will improve Outcome: Progressing   Problem: Activity: Goal: Risk for activity intolerance will decrease Outcome: Progressing   Problem: Pain Managment: Goal: General experience of comfort will improve Outcome: Progressing   Problem: Safety: Goal: Ability to remain free from injury will improve Outcome: Progressing

## 2023-01-16 NOTE — TOC Progression Note (Signed)
Transition of Care [facility]) - Progression Note    Patient Details  Name: Troy May MRN: [id number] Date of Birth: Oct 03, 1950  Transition of Care [facility]) CM/SW Contact  Howell Rucks, RN Phone Number: 01/16/2023, 3:57 PM  Clinical Narrative:  PT recommendation-no follow up. Will continue to follow.          Expected Discharge Plan and Services                                               Social Determinants of Health (SDOH) Interventions SDOH Screenings   Food Insecurity: No Food Insecurity (01/07/2023)  Housing: Low Risk  (01/07/2023)  Transportation Needs: No Transportation Needs (01/07/2023)  Utilities: Not At Risk (01/07/2023)  Alcohol Screen: Low Risk  (09/03/2022)  Depression (PHQ2-9): Low Risk  (09/03/2022)  Financial Resource Strain: Low Risk  (09/03/2022)  Physical Activity: Sufficiently Active (09/03/2022)  Social Connections: Socially Integrated (09/03/2022)  Stress: No Stress Concern Present (09/03/2022)  Tobacco Use: Medium Risk (01/08/2023)    Readmission Risk Interventions     No data to display

## 2023-01-16 NOTE — Progress Notes (Signed)
Rounding Note    Patient Name: Troy May Date of Encounter: 01/16/2023  [facility] Cardiologist: None   Subjective   No issues overnight. Hopes to go home soon Sitting in a chair Renal function improving Still on O2   Inpatient Medications    Scheduled Meds:  docusate sodium  100 mg Oral BID   metoprolol succinate  50 mg Oral QHS   Continuous Infusions:  heparin 1,550 Units/hr (01/16/23 0747)   PRN Meds: mouth rinse, traMADol   Vital Signs    Vitals:   01/16/23 0300 01/16/23 0400 01/16/23 0500 01/16/23 0630  BP:  (!) 146/84 (!) 149/92   Pulse:      Resp: 15 18 15    Temp:   98.9 F (37.2 C)   TempSrc:   Oral   SpO2:   100%   Weight:    95 kg  Height:        Intake/Output Summary (Last 24 hours) at 01/16/2023 0813 Last data filed at 01/16/2023 0630 Gross per 24 hour  Intake 480 ml  Output 1700 ml  Net -1220 ml      01/16/2023    6:30 AM 01/13/2023    5:00 AM 01/07/2023    2:59 PM  Last 3 Weights  Weight (lbs) 209 lb 7 oz 211 lb 204 lb 12.9 oz  Weight (kg) 95 kg 95.709 kg 92.9 kg      Telemetry    NSR - Personally Reviewed  ECG    No new - Personally Reviewed  Physical Exam   Vitals:   01/16/23 0400 01/16/23 0500  BP: (!) 146/84 (!) 149/92  Pulse:    Resp: 18 15  Temp:  98.9 F (37.2 C)  SpO2:  100%    GEN: No acute distress.  On O2 Neck: No sig JVD Cardiac: RRR, no murmurs, rubs, or gallops.  Respiratory: nl wob, mild crackles in the bases GI: non-distended  MS: No edema; No deformity. Neuro:  Nonfocal  Psych: Normal affect   Labs    High Sensitivity Troponin:  No results for input(s): "TROPONINIHS" in the last 720 hours.   Chemistry Recent Labs  Lab 01/14/23 0116 01/15/23 0413 01/16/23 0408  NA 134* 137 137  K 4.2 4.3 4.7  CL 100 104 102  CO2 24 25 25   GLUCOSE 124* 106* 108*  BUN 38* 32* 28*  CREATININE 2.94* 2.18* 1.76*  CALCIUM 8.3* 7.9* 8.2*  GFRNONAA 22* 31* 41*  ANIONGAP 10 8 10     Lipids No  results for input(s): "CHOL", "TRIG", "HDL", "LABVLDL", "LDLCALC", "CHOLHDL" in the last 168 hours.  Hematology Recent Labs  Lab 01/14/23 0116 01/15/23 0413 01/15/23 1252 01/16/23 0408  WBC 9.3 8.4  --  7.2  RBC 3.13* 2.72*  --  3.05*  HGB 9.0* 7.8* 8.3* 8.6*  HCT 28.0* 24.7* 26.7* 27.8*  MCV 89.5 90.8  --  91.1  MCH 28.8 28.7  --  28.2  MCHC 32.1 31.6  --  30.9  RDW 17.1* 17.2*  --  17.1*  PLT 181 211  --  223   Thyroid No results for input(s): "TSH", "FREET4" in the last 168 hours.  BNP Recent Labs  Lab 01/12/23 0801  BNP 1,837.4*    DDimer No results for input(s): "DDIMER" in the last 168 hours.   Radiology    VAS [place] LOWER EXTREMITY VENOUS (DVT)  Result Date: 01/14/2023  Lower Venous DVT Study Patient Name:  Troy May  Date of Exam:  01/14/2023 Medical Rec #: [id number]   Accession #:    [id number] Date of Birth: [birth year]   Patient Gender: M Patient Age:   60 years Exam Location:  [facility] Procedure:      VAS [place] LOWER EXTREMITY VENOUS (DVT) Referring Phys: A POWELL JR --------------------------------------------------------------------------------  Indications: Pulmonary embolism.  Comparison Study: No previous exams Performing Technologist: Jody Hill RVT, RDMS  Examination Guidelines: A complete evaluation includes B-mode imaging, spectral Doppler, color Doppler, and power Doppler as needed of all accessible portions of each vessel. Bilateral testing is considered an integral part of a complete examination. Limited examinations for reoccurring indications may be performed as noted. The reflux portion of the exam is performed with the patient in reverse Trendelenburg.  +---------+---------------+---------+-----------+----------+--------------+ RIGHT    CompressibilityPhasicitySpontaneityPropertiesThrombus Aging +---------+---------------+---------+-----------+----------+--------------+ CFV      Full           Yes      Yes                                  +---------+---------------+---------+-----------+----------+--------------+ SFJ      Full                                                        +---------+---------------+---------+-----------+----------+--------------+ FV Prox  Full           Yes      Yes                                 +---------+---------------+---------+-----------+----------+--------------+ FV Mid   Full           Yes      Yes                                 +---------+---------------+---------+-----------+----------+--------------+ FV DistalFull           Yes      Yes                                 +---------+---------------+---------+-----------+----------+--------------+ PFV      Full                                                        +---------+---------------+---------+-----------+----------+--------------+ POP      Full           Yes      Yes                                 +---------+---------------+---------+-----------+----------+--------------+ PTV      Full                                                        +---------+---------------+---------+-----------+----------+--------------+  PERO     Full                                                        +---------+---------------+---------+-----------+----------+--------------+   +----------+---------------+---------+-----------+----------+--------------+ LEFT      CompressibilityPhasicitySpontaneityPropertiesThrombus Aging +----------+---------------+---------+-----------+----------+--------------+ CFV       Full           Yes      Yes                                 +----------+---------------+---------+-----------+----------+--------------+ SFJ       Full                                                        +----------+---------------+---------+-----------+----------+--------------+ FV Prox   Full           Yes      Yes                                  +----------+---------------+---------+-----------+----------+--------------+ FV Mid    Full           Yes      Yes                                 +----------+---------------+---------+-----------+----------+--------------+ FV Distal Full           Yes      Yes                                 +----------+---------------+---------+-----------+----------+--------------+ PFV       Full                                                        +----------+---------------+---------+-----------+----------+--------------+ POP       Full           Yes      Yes                                 +----------+---------------+---------+-----------+----------+--------------+ Gastroc   None           No       No                   Acute          +----------+---------------+---------+-----------+----------+--------------+ PerforatorNone           No       No                   Acute          +----------+---------------+---------+-----------+----------+--------------+     Summary: BILATERAL: -No evidence of popliteal cyst, bilaterally. RIGHT: - There is no evidence  of deep vein thrombosis in the lower extremity.  LEFT: - Findings consistent with acute deep vein thrombosis involving the left gastrocnemius veins. - Findings consistent with acute thrombosis involving a perforator vein of the calf.  *See table(s) above for measurements and observations. Electronically signed by Gerarda Fraction on 01/14/2023 at 2:54:42 PM.    Final    US RENAL  Result Date: 01/14/2023 CLINICAL DATA:  [id number] AKI (acute kidney injury) (HCC) [id number] EXAM: RENAL / URINARY TRACT ULTRASOUND COMPLETE COMPARISON:  MRI 11/10/2022 FINDINGS: Right Kidney: Renal measurements: 10.8 x 5.2 x 4.9 cm = volume: 145.3 mL. No hydronephrosis. Increased renal echogenicity. There is a solid mass measuring 5.4 x 5.6 x 4.7 cm, as seen on prior MRI. Left Kidney: Renal measurements: 12.4 x 5.6 x 4.5 cm = volume: 161.3 mL. No hydronephrosis.  Increased renal cortical echogenicity. Poorly visualized due to body habitus. History of prior partial left nephrectomy. Bladder: Appears normal for degree of bladder distention. Other: None. IMPRESSION: No hydronephrosis. Increased renal cortical echogenicity bilaterally, as can be seen in medical renal disease. Solid right renal mass measuring 5.4 x 5.6 x 4.7 cm, as seen on prior MRI, compatible with renal neoplasm. Electronically Signed   By: Caprice Renshaw M.D.   On: 01/14/2023 12:03   ECHOCARDIOGRAM COMPLETE  Result Date: 01/14/2023    ECHOCARDIOGRAM REPORT   Patient Name:   LEBRANDON HUSKA Date of Exam: 01/14/2023 Medical Rec #:  [id number]  Height:       70.0 in Accession #:    [id number] Weight:       211.0 lb Date of Birth:  11-May-1951  BSA:          2.135 m Patient Age:    72 years   BP:           122/82 mmHg Patient Gender: M          HR:           84 bpm. Exam Location:  Inpatient Procedure: 2D Echo, 3D Echo, Color Doppler, Cardiac Doppler and Intracardiac            Opacification Agent REPORT CONTAINS CRITICAL RESULT Indications:    Pulmonary Embolus  History:        Patient has prior history of Echocardiogram examinations, most                 recent 07/02/2022. CHF, CAD, AKI, Arrythmias:Atrial Fibrillation;                 Risk Factors:Hypertension, Dyslipidemia and Diabetes. H/o apical                 thrombus (off Xarelto since 5/6 for renal mass removal on 5/10).  Sonographer:    Milbert Coulter Referring Phys: [phone] A CALDWELL POWELL JR IMPRESSIONS  1. Left ventricular ejection fraction, by estimation, is 25 to 30%. The left ventricle has severely decreased function. The left ventricle demonstrates regional wall motion abnormalities with akinesis of the apex and the peri-apical segments. There is moderate concentric left ventricular hypertrophy. Left ventricular diastolic parameters are consistent with Grade I diastolic dysfunction (impaired relaxation). Large globular 2 x 2.1 cm apical thrombus present.   2. Right ventricular systolic function is mildly reduced. The right ventricular size is mildly enlarged. There is normal pulmonary artery systolic pressure. The estimated right ventricular systolic pressure is 33.7 mmHg.  3. Left atrial size was mildly dilated.  4. The mitral valve is normal in structure. Trivial mitral valve regurgitation.  No evidence of mitral stenosis.  5. The aortic valve is tricuspid. There is mild calcification of the aortic valve. Aortic valve regurgitation is not visualized. No aortic stenosis is present.  6. Aortic dilatation noted. There is mild dilatation of the ascending aorta, measuring 38 mm.  7. The inferior vena cava is normal in size with greater than 50% respiratory variability, suggesting right atrial pressure of 3 mmHg. FINDINGS  Left Ventricle: Left ventricular ejection fraction, by estimation, is 25 to 30%. The left ventricle has severely decreased function. The left ventricle demonstrates regional wall motion abnormalities. Definity contrast agent was given IV to delineate the left ventricular endocardial borders. The left ventricular internal cavity size was normal in size. There is moderate concentric left ventricular hypertrophy. Left ventricular diastolic parameters are consistent with Grade I diastolic dysfunction (impaired relaxation). Right Ventricle: The right ventricular size is mildly enlarged. No increase in right ventricular wall thickness. Right ventricular systolic function is mildly reduced. There is normal pulmonary artery systolic pressure. The tricuspid regurgitant velocity  is 2.77 m/s, and with an assumed right atrial pressure of 3 mmHg, the estimated right ventricular systolic pressure is 33.7 mmHg. Left Atrium: Left atrial size was mildly dilated. Right Atrium: Right atrial size was normal in size. Pericardium: There is no evidence of pericardial effusion. Mitral Valve: The mitral valve is normal in structure. Trivial mitral valve regurgitation. No  evidence of mitral valve stenosis. Tricuspid Valve: The tricuspid valve is normal in structure. Tricuspid valve regurgitation is trivial. Aortic Valve: The aortic valve is tricuspid. There is mild calcification of the aortic valve. Aortic valve regurgitation is not visualized. No aortic stenosis is present. Aortic valve mean gradient measures 5.0 mmHg. Aortic valve peak gradient measures 10.1 mmHg. Aortic valve area, by VTI measures 2.66 cm. Pulmonic Valve: The pulmonic valve was normal in structure. Pulmonic valve regurgitation is not visualized. Aorta: Aortic dilatation noted. There is mild dilatation of the ascending aorta, measuring 38 mm. Venous: The inferior vena cava is normal in size with greater than 50% respiratory variability, suggesting right atrial pressure of 3 mmHg. IAS/Shunts: No atrial level shunt detected by color flow Doppler. Additional Comments: A device lead is visualized in the right ventricle.  LEFT VENTRICLE PLAX 2D LVIDd:         5.30 cm      Diastology LVIDs:         4.10 cm      LV e' medial:    4.24 cm/s LV PW:         1.10 cm      LV E/e' medial:  9.2 LV IVS:        1.10 cm      LV e' lateral:   8.16 cm/s LVOT diam:     2.00 cm      LV E/e' lateral: 4.8 LV SV:         65 LV SV Index:   30 LVOT Area:     3.14 cm  LV Volumes (MOD) LV vol d, MOD A2C: 117.0 ml LV vol d, MOD A4C: 101.0 ml LV vol s, MOD A2C: 72.9 ml LV vol s, MOD A4C: 65.5 ml LV SV MOD A2C:     44.1 ml LV SV MOD A4C:     101.0 ml LV SV MOD BP:      42.2 ml RIGHT VENTRICLE RV Basal diam:  3.40 cm RV Mid diam:    2.50 cm RV S prime:     10.20 cm/s  TAPSE (M-mode): 1.6 cm LEFT ATRIUM             Index        RIGHT ATRIUM           Index LA diam:        3.70 cm 1.73 cm/m   RA Area:     14.80 cm LA Vol (A2C):   38.1 ml 17.84 ml/m  RA Volume:   34.40 ml  16.11 ml/m LA Vol (A4C):   36.3 ml 17.00 ml/m LA Biplane Vol: 38.2 ml 17.89 ml/m  AORTIC VALVE AV Area (Vmax):    2.65 cm AV Area (Vmean):   2.43 cm AV Area (VTI):      2.66 cm AV Vmax:           159.00 cm/s AV Vmean:          107.000 cm/s AV VTI:            0.243 m AV Peak Grad:      10.1 mmHg AV Mean Grad:      5.0 mmHg LVOT Vmax:         134.00 cm/s LVOT Vmean:        82.600 cm/s LVOT VTI:          0.206 m LVOT/AV VTI ratio: 0.85  AORTA Ao Root diam: 3.40 cm Ao Asc diam:  3.80 cm MITRAL VALVE               TRICUSPID VALVE MV Area (PHT): 3.08 cm    TR Peak grad:   30.7 mmHg MV Decel Time: 246 msec    TR Vmax:        277.00 cm/s MV E velocity: 38.80 cm/s MV A velocity: 74.40 cm/s  SHUNTS MV E/A ratio:  0.52        Systemic VTI:  0.21 m                            Systemic Diam: 2.00 cm Dalton McleanMD Electronically signed by Wilfred Lacy Signature Date/Time: 01/14/2023/10:12:47 AM    Final     Cardiac Studies   Echo 01/14/23 1. Left ventricular ejection fraction, by estimation, is 25 to 30%. The  left ventricle has severely decreased function. The left ventricle  demonstrates regional wall motion abnormalities with akinesis of the apex  and the peri-apical segments. There is  moderate concentric left ventricular hypertrophy. Left ventricular  diastolic parameters are consistent with Grade I diastolic dysfunction  (impaired relaxation). Large globular 2 x 2.1 cm apical thrombus present.   2. Right ventricular systolic function is mildly reduced. The right  ventricular size is mildly enlarged. There is normal pulmonary artery  systolic pressure. The estimated right ventricular systolic pressure is  33.7 mmHg.   3. Left atrial size was mildly dilated.   4. The mitral valve is normal in structure. Trivial mitral valve  regurgitation. No evidence of mitral stenosis.   5. The aortic valve is tricuspid. There is mild calcification of the  aortic valve. Aortic valve regurgitation is not visualized. No aortic  stenosis is present.   6. Aortic dilatation noted. There is mild dilatation of the ascending  aorta, measuring 38 mm.   7. The inferior vena cava is normal  in size with greater than 50%  respiratory variability, suggesting right atrial pressure of 3 mmHg.   Right/ Left Cardiac Catheterization 07/27/2021:   Ost RCA to Prox RCA lesion  is 100% stenosed.   Prox Cx to Mid Cx lesion is 30% stenosed.   1st Mrg lesion is 20% stenosed.   Prox LAD lesion is 60% stenosed.   Mid LAD lesion is 90% stenosed.   Mid LAD to Dist LAD lesion is 100% stenosed.   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 Continue diuresis. Titrate GDMT. Place PICC. Will need cMRI to assess viability of LAD territory but suspect minimal viability based on echo.    Will discuss management of RCC with IR with possible embolization.    Diagnostic Dominance: Right  _______________   Cardiac MRI 07/28/2021: Impression: Severe LV dysfunction. LAD and RCA territories with scar due not appear viable.   Large LV thrombus. _______________   Echocardiogram 07/02/2022: Impressions:  1. The left ventricle demonstrates regional wall motion abnormalities  (Apical infarct without LV thrombus, evidence of LAD and RCA infarct, LVEF  25-30%). There is moderate concentric left ventricular hypertrophy. Left  ventricular diastolic parameters  are consistent with Grade I diastolic dysfunction (impaired relaxation).   2. Right ventricular systolic function is normal. The right ventricular  size is normal. There is normal pulmonary artery systolic pressure.   3. Left atrial size was mildly dilated.   4. The mitral valve is normal in structure. Mild mitral valve  regurgitation. No evidence of mitral stenosis.   5. Tricuspid valve regurgitation is mild to moderate.   6. The aortic valve  is tricuspid. Aortic valve regurgitation is not  visualized.   7. Aortic dilatation noted. There is mild dilatation of the ascending  aorta, measuring 41 mm.   8. The inferior vena cava is normal in size with greater than 50%  respiratory variability, suggesting right atrial pressure of 3 mmHg.   Comparison(s): Resolution of apical thrombus; study done with contrast.   Patient Profile     72 y.o. male  with a history of severe 2-vessel CAD with CTO of ostial to proximal RCA and mid to distal LAD on cardiac catheterization in 06/2021 (areas not viable on cardiac MRI), ischemic cardiomyopathy with biventricular CHF and LVEF of 25-30% in 06/2022, LV thrombus on Echo in 06/2021 (resolved on last Echo in 06/2022 but remains on Xraelto), dilatated ascending thoracic aorta,  hypertension, hyperlipidemia, CKD stage III, and bilateral renal masses suspicious for renal cell carcinoma  who is being seen 01/12/2023 for the evaluation of acute on chronic ischemic heart failure with scar and LV thrombus.   Assessment & Plan    Acute hypoxic respiratory failure Acute on chronic BiV CHF ICM - Last echo 06/2022 showed LVEF 25-30% with persistent wall motion abnormalities, G1DD, normal RV - admitted for planned partial left nephrectomy and has required supplemental O2 since then - CXR with small left pleural effusion  - s/p IV lasix - Net -1.6L - PTA entresto and spiro held on admission - continue toprol and Jardiance - repeat echo ordered showed LVEF 25-30%, WMA with akinesis of the apex and per-apical segments. Moderate LVH with G1DD, mildly reduced RVSF, trivial MR - patient appears euvolemic on exam, has been on O2  --> renal function  has improved, Will contine to hold off on diuretics over the weekend --> can uptitrate GDMT if renal fxn continues to improve. Can plan for low dose jardiance, entresto, and spironoalctone  CAD - h/o 2V CAD with CTO of RCA and LAD on cardiac cath in 06/2021 - cMRI at  that time showed scar in the RCA and LAD territories - no chest pain reported - continue ASA, BB and statin  LV thrombus - noted on echo 06/2021. Last echo 06/2022 showed complete resolution of thrombus, but remained on Xarelto - Xarelto held for nephrectomy - repeat echo showed recurrent LV thrombus> on IV heparin - plan to restart Xarelto 15mg  daily; see dosing below  Provoked PE -Lung perfusion scan shows multiple defects c/w acute pulmonary embolism.  - recommended lifelong AC with LV thrombus with apical scar   For the first 21 days, 15 mg twice daily with food; Starting at day 22, change to 20 mg once daily  After 3 months would switch to the dose for intracardiac thrombus prevention (adjusted for renal dysfunction), 15 mg daily, indefinitely.  CKD stage 3 - Scr 1.38 on admission; up to 2s, now  improving - Baseline Scr 1.4-1.6 - kidney function continues to worsen, not on nephrotoxic meds - continue to trend kidney function  No changes today 5/19  For questions or updates, please contact [facility] Please consult [email or web address] for contact info under        Signed, Maisie Fus, MD  01/16/2023, 8:13 AM

## 2023-01-16 NOTE — Progress Notes (Signed)
ANTICOAGULATION CONSULT NOTE - follow up  Pharmacy Consult for Heparin Indication:  new PE  No Known Allergies  Patient Measurements: Height: 5\' 10"  (177.8 cm) Weight: 95.7 kg (211 lb) IBW/kg (Calculated) : 73 Heparin Dosing Weight:  92.4 kg  Vital Signs: Temp: 98.4 F (36.9 C) (05/18 2154) Temp Source: Oral (05/18 2154) BP: 146/84 (05/19 0400) Pulse Rate: 78 (05/18 2101)  Labs: Recent Labs    01/14/23 0116 01/14/23 1051 01/15/23 0413 01/15/23 1252 01/16/23 0408  HGB 9.0*  --  7.8* 8.3* 8.6*  HCT 28.0*  --  24.7* 26.7* 27.8*  PLT 181  --  211  --  223  HEPARINUNFRC 0.80* 0.62 0.40  --  0.44  CREATININE 2.94*  --  2.18*  --  1.76*     Estimated Creatinine Clearance: 44.1 mL/min (A) (by C-G formula based on SCr of 1.76 mg/dL (H)).   Medical History: Past Medical History:  Diagnosis Date   Ascending aortic aneurysm (HCC)    Bilateral renal masses    CAD (coronary artery disease)    CHF (congestive heart failure) (HCC)    HLD (hyperlipidemia)    Hypertension    LV (left ventricular) mural thrombus    Systolic heart failure (HCC)     Assessment: AC/Heme: PTA Xarelto for LV mural thrombus held (LD 5/6). Urology Surgery 5/10 -SCDs ordered post-op 5/11: Left upper port site oozing sanguineous drainage. JP site dressing saturated. Hgb drop to 10.5. 5/12: Hgb down to 8.5, Plts 179>89? Dressings dry today 5/13: Hgb 8.2 > 9.2, drain output slowed significantly  5/14: Hgb down to 8.5, Plt low but improved to 100 5/15: Hgb stable at 8.5. Plt low but improved to 120. MD started SQ heparin q12h 5/16: VQ scan: New BL PE. No CBC done today  01/16/2023 HL 0.44 therapeutic on 1550 units/hr Hgb low but stable, plts WNL No bleeding noted   Goal of Therapy:  Heparin level 0.3-0.7 units/ml Monitor platelets by anticoagulation protocol: Yes   Plan:  Continue heparin drip at 1550 units/hr Daily HL and CBC Await word on plans for transitioning back to PO Xarelto.     Arley Phenix RPh 01/16/2023, 5:02 AM

## 2023-01-16 NOTE — Progress Notes (Signed)
Progress NOTE    Ezell Vogelpohl  WUJ:[id number] DOB: May 22, 1951 DOA: 01/07/2023 PCP: Georgina Quint, MD  No chief complaint on file.   Brief Narrative:   Chioke Jorde is Amarilis Belflower 72 y.o. male with past medical history of left ventricle mural thrombus, ascending aortic aneurysm, chronic combined systolic and diastolic heart failure, CAD, bilateral renal masses, hyperlipidemia who we are being consulted for dyspnea and new oxygen requirement. He denied fever, chills, rhinorrhea, sore throat, wheezing or hemoptysis.  No chest pain, palpitations, diaphoresis, PND, passout positive orthopnea, lower extremities and bilateral hands edema.  No nausea, emesis, diarrhea, constipation, melena or hematochezia.  No flank pain or dysuria.  No polyuria, polydipsia, polyphagia or blurred vision.   Significant Events 5/10 Robotic assisted left laparoscopic left partial nephrectomy 5/15 TRH consulted for presumed HF.  Cardiology consulted. 5/16 VQ scan positive for PE 5/17 echo with LV thrombus.  TRH will take over as primary.  Assessment & Plan:   Principal Problem:   Renal cell carcinoma (HCC) Active Problems:   Essential hypertension   AKI (acute kidney injury) (HCC)   Acute combined systolic and diastolic heart failure (HCC)   Left ventricular apical thrombus   Coronary artery disease of native artery of native heart with stable angina pectoris (HCC)   Dyslipidemia   Renal mass   Type 2 diabetes mellitus (HCC)   Biventricular heart failure (HCC)   Paroxysmal atrial fibrillation (HCC)   Acute on chronic combined systolic and diastolic CHF (congestive heart failure) (HCC)   Acute pulmonary embolism (HCC)  Acute Hypoxic Respiratory Failure  Pulmonary Embolism  Left Leg DVT Due to PE Echo as below, mildly reduced RVSF Acute DVT involving L gastrocnemius vein, acute thrombus involving Treysean Petruzzi perforator vein of the calf  Start xarelto today Continues to require O2, desatted to 82% on RA with  ambulation Follow CXR  Renal mass Left renal mass Will need urology follow up outpatient   Acute on chronic combined systolic and diastolic heart failure (HCC) Echo with EF 25-30%, grade 1 diastolic dysfunction Repeat echo with EF 25-30%, regional wall motion abnormalities with akinesis of the apex and periapical segments, grade 1 diastolic dysfunction, large globular 2x2.1 cm apical thrombus.  RVSF mildly reduced.    Appreciate cards recs - HF meds on hold with AKI -> will consider starting some meds back tomorrow  I/O, daily weights Appreciate cards recs  AKI (acute kidney injury) (HCC) AKI peaked at 2.94 Continuing to improve towards baseline, today 1.7s Will monitor, baseline creatinine 1.3-1.4 Renal US without hydro - medical renal disease - solid right renal mass measuring 5.4x5.6x4.7 Strict I/O, daily weights   Essential hypertension Continue metoprolol succinate 50 mg p.o. daily. Entresto on hold, spironolactone on hold   Left ventricular apical thrombus Xarelto was held prior to procedure. Echo with large globular apical thrombus Currently on heparin gtt -> xarelto  Appreciate cards recs - anticoagulation indefinitely    Coronary artery disease of native artery of  native heart with stable angina pectoris (HCC) On beta-blocker. Statin, aspirin and DOAC on hold.  Currently on heparin gtt -> xarelto    Dyslipidemia Atorvastatin currently being held.   Type 2 diabetes mellitus (HCC)  Jardiance on hold A1c 6    DVT prophylaxis: heparin -> xarelto  Code Status: full Family Communication: wife at bedside Disposition:   Status is: Inpatient Remains inpatient appropriate because: worsening creatinine   Urology is Primary Cardiology consulting [facility] consulting  Procedures:  Robotic assisted laparoscopic left partial nephrectomy  Antimicrobials:  Anti-infectives (From admission, onward)    Start     Dose/Rate Route Frequency Ordered Stop   01/07/23 1630   ceFAZolin (ANCEF) IVPB 1 g/50 mL premix        1 g 100 mL/hr over 30 Minutes Intravenous Every 8 hours 01/07/23 1602 01/08/23 0038   01/07/23 0529  ceFAZolin (ANCEF) IVPB 2g/100 mL premix        2 g 200 mL/hr over 30 Minutes Intravenous 30 min pre-op 01/07/23 0529 01/07/23 0818       Subjective: No complaints today  Objective: Vitals:   01/16/23 0400 01/16/23 0500 01/16/23 0630 01/16/23 1227  BP: (!) 146/84 (!) 149/92  134/72  Pulse:    63  Resp: 18 15  18   Temp:  98.9 F (37.2 C)  98.5 F (36.9 C)  TempSrc:  Oral  Oral  SpO2:  100%  95%  Weight:   95 kg   Height:        Intake/Output Summary (Last 24 hours) at 01/16/2023 1545 Last data filed at 01/16/2023 1436 Gross per 24 hour  Intake 480 ml  Output 2325 ml  Net -1845 ml   Filed Weights   01/07/23 1459 01/13/23 0500 01/16/23 0630  Weight: 92.9 kg 95.7 kg 95 kg    Examination:  General: No acute distress. Cardiovascular: RRR Lungs: unlabored, CTAB Abdomen: Soft, nontender, nondistended Neurological: Alert and oriented 3. Moves all extremities 4 with equal strength. Cranial nerves II through XII grossly intact. Extremities: No clubbing or cyanosis. No edema.   Data Reviewed: I have personally reviewed following labs and imaging studies  CBC: Recent Labs  Lab 01/11/23 0819 01/12/23 0801 01/14/23 0116 01/15/23 0413 01/15/23 1252 01/16/23 0408  WBC 7.5 7.8 9.3 8.4  --  7.2  NEUTROABS  --  5.4  --   --   --   --   HGB 8.5* 8.5* 9.0* 7.8* 8.3* 8.6*  HCT 27.2* 27.3* 28.0* 24.7* 26.7* 27.8*  MCV 90.4 90.4 89.5 90.8  --  91.1  PLT 100* 120* 181 211  --  223    Basic Metabolic Panel: Recent Labs  Lab 01/12/23 0801 01/13/23 0415 01/14/23 0116 01/15/23 0413 01/16/23 0408  NA 131* 130* 134* 137 137  K 3.9 4.1 4.2 4.3 4.7  CL 101 98 100 104 102  CO2 21* 22 24 25 25   GLUCOSE 114* 111* 124* 106* 108*  BUN 30* 31* 38* 32* 28*  CREATININE 2.24* 2.69* 2.94* 2.18* 1.76*  CALCIUM 8.0* 8.0* 8.3* 7.9* 8.2*     GFR: Estimated Creatinine Clearance: 43.9 mL/min (Elianny Buxbaum) (by C-G formula based on SCr of 1.76 mg/dL (H)).  Liver Function Tests: No results for input(s): "AST", "ALT", "ALKPHOS", "BILITOT", "PROT", "ALBUMIN" in the last 168 hours.  CBG: Recent Labs  Lab 01/11/23 1701 01/11/23 2034 01/11/23 2359 01/12/23 0421 01/12/23 0745  GLUCAP 134* 148* 108* 109* 110*     No results found for this or any previous visit (from the past 240 hour(s)).       Radiology Studies: No results found.      Scheduled Meds:  docusate sodium  100 mg Oral BID   metoprolol succinate  50 mg Oral QHS   Rivaroxaban  15 mg Oral BID WC   Followed by   Melene Muller ON 02/06/2023] rivaroxaban  20 mg Oral Q supper   Continuous Infusions:     LOS: 9 days    Time spent: over 30 min    Lacretia Nicks,  MD [facility]   To contact the attending provider between 7A-7P or the covering provider during after hours 7P-7A, please log into the web site [email or web address] and access using universal [place] password for that web site. If you do not have the password, please call the hospital operator.  01/16/2023, 3:45 PM

## 2023-01-17 DIAGNOSIS — I5020 Unspecified systolic (congestive) heart failure: Secondary | ICD-10-CM | POA: Diagnosis not present

## 2023-01-17 DIAGNOSIS — C649 Malignant neoplasm of unspecified kidney, except renal pelvis: Secondary | ICD-10-CM | POA: Diagnosis not present

## 2023-01-17 LAB — CBC
HCT: 25.3 % — ABNORMAL LOW (ref 39.0–52.0)
Hemoglobin: 7.9 g/dL — ABNORMAL LOW (ref 13.0–17.0)
MCH: 28 pg (ref 26.0–34.0)
MCHC: 31.2 g/dL (ref 30.0–36.0)
MCV: 89.7 fL (ref 80.0–100.0)
Platelets: 286 10*3/uL (ref 150–400)
RBC: 2.82 MIL/uL — ABNORMAL LOW (ref 4.22–5.81)
RDW: 16.9 % — ABNORMAL HIGH (ref 11.5–15.5)
WBC: 6.8 10*3/uL (ref 4.0–10.5)
nRBC: 0.7 % — ABNORMAL HIGH (ref 0.0–0.2)

## 2023-01-17 LAB — BASIC METABOLIC PANEL
Anion gap: 7 (ref 5–15)
BUN: 24 mg/dL — ABNORMAL HIGH (ref 8–23)
CO2: 25 mmol/L (ref 22–32)
Calcium: 8.2 mg/dL — ABNORMAL LOW (ref 8.9–10.3)
Chloride: 104 mmol/L (ref 98–111)
Creatinine, Ser: 1.59 mg/dL — ABNORMAL HIGH (ref 0.61–1.24)
GFR, Estimated: 46 mL/min — ABNORMAL LOW (ref >60)
Glucose, Bld: 104 mg/dL — ABNORMAL HIGH (ref 70–99)
Potassium: 4.5 mmol/L (ref 3.5–5.1)
Sodium: 136 mmol/L (ref 135–145)

## 2023-01-17 LAB — BRAIN NATRIURETIC PEPTIDE: B Natriuretic Peptide: 1965.9 pg/mL — ABNORMAL HIGH (ref 0.0–100.0)

## 2023-01-17 MED ORDER — FUROSEMIDE 10 MG/ML IJ SOLN
40.0000 mg | Freq: Once | INTRAMUSCULAR | Status: AC
  Administered 2023-01-17: 40 mg via INTRAVENOUS
  Filled 2023-01-17: qty 4

## 2023-01-17 NOTE — Progress Notes (Signed)
Mobility Specialist - Progress Note   01/17/23 1022  Oxygen Therapy  SpO2 97 %  O2 Device Nasal Cannula  Mobility  Activity Ambulated independently in hallway  Level of Assistance Independent  Assistive Device None  Distance Ambulated (ft) 350 ft  Activity Response Tolerated well  Mobility Referral Yes  $Mobility charge 1 Mobility  Mobility Specialist Start Time (ACUTE ONLY) 1012  Mobility Specialist Stop Time (ACUTE ONLY) 1021  Mobility Specialist Time Calculation (min) (ACUTE ONLY) 9 min   Pt received in recliner and agreeable to mobility. No complaints during session. Pt to recliner after session with all needs met.    During mobility: 98 HR, 97% SpO2 Post-mobility: 81 HR, 94%  SPO2  Chief Technology Officer

## 2023-01-17 NOTE — Progress Notes (Signed)
Mobility Specialist - Progress Note   01/17/23 1415  Oxygen Therapy  SpO2 (!) 84 %  O2 Device Room Air  Patient Activity (if Appropriate) Ambulating  Mobility  Activity Ambulated independently in hallway  Level of Assistance Independent  Assistive Device None  Distance Ambulated (ft) 250 ft  Activity Response Tolerated well  Mobility Referral Yes  $Mobility charge 1 Mobility   Nurse requested Mobility Specialist to perform oxygen saturation test with pt which includes removing pt from oxygen both at rest and while ambulating.  Below are the results from that testing.     Patient Saturations on Room Air at Rest = spO2 94%  Patient Saturations on Room Air while Ambulating = sp02 84% .    Patient Saturations on 2 Liters of oxygen while Ambulating = sp02 98%  At end of testing pt left in room on 2  Liters of oxygen.  Reported results to nurse. Pt received in recliner and agreeable to mobility. No complaints during session. Pt to recliner after session with all needs met.  Pre-mobility: 74 HR, 95% SpO2 (RA) During mobility:  84% SpO2 (RA) Post-mobility:  97% SPO2 (2l [place])  Chief Technology Officer

## 2023-01-17 NOTE — Care Management Important Message (Signed)
Important Message  Patient Details IM Letter given. Name: Troy May MRN: [id number] Date of Birth: 1950-11-15   Medicare Important Message Given:  Yes     Caren Macadam 01/17/2023, 11:38 AM

## 2023-01-17 NOTE — Progress Notes (Signed)
Progress NOTE    Troy May  GNF:[id number] DOB: [birth year] DOA: 01/07/2023 PCP: Georgina Quint, MD  No chief complaint on file.   Brief Narrative:   Troy May is Troy May 72 y.o. male with past medical history of left ventricle mural thrombus, ascending aortic aneurysm, chronic combined systolic and diastolic heart failure, CAD, bilateral renal masses, hyperlipidemia who we are being consulted for dyspnea and new oxygen requirement. He denied fever, chills, rhinorrhea, sore throat, wheezing or hemoptysis.  No chest pain, palpitations, diaphoresis, PND, passout positive orthopnea, lower extremities and bilateral hands edema.  No nausea, emesis, diarrhea, constipation, melena or hematochezia.  No flank pain or dysuria.  No polyuria, polydipsia, polyphagia or blurred vision.   Significant Events 5/10 Robotic assisted left laparoscopic left partial nephrectomy 5/15 TRH consulted for presumed HF.  Cardiology consulted. 5/16 VQ scan positive for PE 5/17 echo with LV thrombus.  TRH will take over as primary.  Assessment & Plan:   Principal Problem:   Renal cell carcinoma (HCC) Active Problems:   Essential hypertension   AKI (acute kidney injury) (HCC)   Acute combined systolic and diastolic heart failure (HCC)   Left ventricular apical thrombus   Coronary artery disease of native artery of native heart with stable angina pectoris (HCC)   Dyslipidemia   Renal mass   Type 2 diabetes mellitus (HCC)   Biventricular heart failure (HCC)   Paroxysmal atrial fibrillation (HCC)   Acute on chronic combined systolic and diastolic CHF (congestive heart failure) (HCC)   Acute pulmonary embolism (HCC)  Acute Hypoxic Respiratory Failure  Pulmonary Embolism  Left Leg DVT Due to PE Echo as below, mildly reduced RVSF Acute DVT involving L gastrocnemius vein, acute thrombus involving Troy May perforator vein of the calf  Start xarelto today Continues to require O2, desatted to 84% on RA with ambulation  -> may need to discharge home with supplemental O2, follow after diuresis  Follow CXR  Renal mass Left renal mass Will need urology follow up outpatient   Acute on chronic combined systolic and diastolic heart failure (HCC) Echo with EF 25-30%, grade 1 diastolic dysfunction Repeat echo with EF 25-30%, regional wall motion abnormalities with akinesis of the apex and periapical segments, grade 1 diastolic dysfunction, large globular 2x2.1 cm apical thrombus.  RVSF mildly reduced.    Appreciate cards recs - lasix today, will follow  I/O, daily weights Appreciate cards recs  AKI (acute kidney injury) (HCC) AKI peaked at 2.94 Continuing to improve towards baseline Will monitor, baseline creatinine 1.3-1.4 Renal US without hydro - medical renal disease - solid right renal mass measuring 5.4x5.6x4.7 Strict I/O, daily weights   Essential hypertension Continue metoprolol succinate 50 mg p.o. daily. Entresto on hold, spironolactone on hold   Left ventricular apical thrombus Xarelto was held prior to procedure. Echo with large globular apical thrombus Currently on heparin gtt -> xarelto  Appreciate cards recs - anticoagulation indefinitely    Coronary artery disease of native artery of  native heart with stable angina pectoris (HCC) On beta-blocker. Statin, aspirin and DOAC on hold.  Currently on heparin gtt -> xarelto    Dyslipidemia Atorvastatin currently being held.   Type 2 diabetes mellitus (HCC)  Jardiance on hold A1c 6    DVT prophylaxis: heparin -> xarelto  Code Status: full Family Communication: wife at bedside Disposition:   Status is: Inpatient Remains inpatient appropriate because: worsening creatinine   Urology is Primary Cardiology consulting [facility] consulting  Procedures:  Robotic assisted laparoscopic left partial  nephrectomy   Antimicrobials:  Anti-infectives (From admission, onward)    Start     Dose/Rate Route Frequency Ordered Stop   01/07/23 1630   ceFAZolin (ANCEF) IVPB 1 g/50 mL premix        1 g 100 mL/hr over 30 Minutes Intravenous Every 8 hours 01/07/23 1602 01/08/23 0038   01/07/23 0529  ceFAZolin (ANCEF) IVPB 2g/100 mL premix        2 g 200 mL/hr over 30 Minutes Intravenous 30 min pre-op 01/07/23 0529 01/07/23 0818       Subjective: Denies complaints  Objective: Vitals:   01/16/23 0400 01/16/23 0500 01/16/23 0630 01/16/23 1227  BP: (!) 146/84 (!) 149/92  134/72  Pulse:    63  Resp: 18 15  18   Temp:  98.9 F (37.2 C)  98.5 F (36.9 C)  TempSrc:  Oral  Oral  SpO2:  100%  95%  Weight:   95 kg   Height:        Intake/Output Summary (Last 24 hours) at 01/16/2023 1545 Last data filed at 01/16/2023 1436 Gross per 24 hour  Intake 480 ml  Output 2325 ml  Net -1845 ml   Filed Weights   01/07/23 1459 01/13/23 0500 01/16/23 0630  Weight: 92.9 kg 95.7 kg 95 kg    Examination:  General: No acute distress. Cardiovascular: RRR Lungs: Clear to auscultation bilaterally  Abdomen: Soft, nontender, nondistended Neurological: Alert and oriented 3. Moves all extremities 4 with equal strength. Cranial nerves II through XII grossly intact. Extremities: No clubbing or cyanosis. Dependent hip edema  Data Reviewed: I have personally reviewed following labs and imaging studies  CBC: Recent Labs  Lab 01/11/23 0819 01/12/23 0801 01/14/23 0116 01/15/23 0413 01/15/23 1252 01/16/23 0408  WBC 7.5 7.8 9.3 8.4  --  7.2  NEUTROABS  --  5.4  --   --   --   --   HGB 8.5* 8.5* 9.0* 7.8* 8.3* 8.6*  HCT 27.2* 27.3* 28.0* 24.7* 26.7* 27.8*  MCV 90.4 90.4 89.5 90.8  --  91.1  PLT 100* 120* 181 211  --  223    Basic Metabolic Panel: Recent Labs  Lab 01/12/23 0801 01/13/23 0415 01/14/23 0116 01/15/23 0413 01/16/23 0408  NA 131* 130* 134* 137 137  K 3.9 4.1 4.2 4.3 4.7  CL 101 98 100 104 102  CO2 21* 22 24 25 25   GLUCOSE 114* 111* 124* 106* 108*  BUN 30* 31* 38* 32* 28*  CREATININE 2.24* 2.69* 2.94* 2.18* 1.76*   CALCIUM 8.0* 8.0* 8.3* 7.9* 8.2*    GFR: Estimated Creatinine Clearance: 43.9 mL/min (Sharita Bienaime) (by C-G formula based on SCr of 1.76 mg/dL (H)).  Liver Function Tests: No results for input(s): "AST", "ALT", "ALKPHOS", "BILITOT", "PROT", "ALBUMIN" in the last 168 hours.  CBG: Recent Labs  Lab 01/11/23 1701 01/11/23 2034 01/11/23 2359 01/12/23 0421 01/12/23 0745  GLUCAP 134* 148* 108* 109* 110*     No results found for this or any previous visit (from the past 240 hour(s)).       Radiology Studies: No results found.      Scheduled Meds:  docusate sodium  100 mg Oral BID   metoprolol succinate  50 mg Oral QHS   Rivaroxaban  15 mg Oral BID WC   Followed by   Melene Muller ON 02/06/2023] rivaroxaban  20 mg Oral Q supper   Continuous Infusions:     LOS: 9 days    Time spent: over 30 min  Lacretia Nicks, MD [facility]   To contact the attending provider between 7A-7P or the covering provider during after hours 7P-7A, please log into the web site [email or web address] and access using universal [place] password for that web site. If you do not have the password, please call the hospital operator.  01/16/2023, 3:45 PM

## 2023-01-17 NOTE — Progress Notes (Addendum)
Progress Note  Patient Name: Troy May Date of Encounter: 01/17/2023  Primary Cardiologist: Bensimhon ([facility])  Subjective   Feeling fine, offers no complaints. No CP or SOB. Nurse note overnight reported some red urine, subsequently becoming more clear.  Inpatient Medications    Scheduled Meds:  docusate sodium  100 mg Oral BID   metoprolol succinate  50 mg Oral QHS   Rivaroxaban  15 mg Oral BID WC   Followed by   Melene Muller ON 02/06/2023] rivaroxaban  20 mg Oral Q supper   Continuous Infusions:  PRN Meds: mouth rinse, traMADol   Vital Signs    Vitals:   01/16/23 2017 01/16/23 2153 01/17/23 0627 01/17/23 0631  BP: (!) 165/82 (!) 145/82 126/74   Pulse: 69 73 66   Resp: 18  18   Temp: 98.9 F (37.2 C) 98.6 F (37 C) 99.4 F (37.4 C)   TempSrc: Oral  Oral   SpO2: 100%  100%   Weight:    94.6 kg  Height:        Intake/Output Summary (Last 24 hours) at 01/17/2023 0752 Last data filed at 01/17/2023 0600 Gross per 24 hour  Intake 720 ml  Output 2025 ml  Net -1305 ml      01/17/2023    6:31 AM 01/16/2023    6:30 AM 01/13/2023    5:00 AM  Last 3 Weights  Weight (lbs) 208 lb 8.9 oz 209 lb 7 oz 211 lb  Weight (kg) 94.6 kg 95 kg 95.709 kg     Telemetry    NSR - Personally Reviewed  Physical Exam   GEN: No acute distress.  HEENT: Normocephalic, atraumatic, sclera non-icteric. Neck: No bruits. JVD does appear elevated. Cardiac: RRR no murmurs, rubs, or gallops.  Respiratory: Coarse BS at bases bilaterally. No wheezing or rhonchi. Breathing is unlabored. GI: Soft, nontender, non-distended, BS +x 4. MS: no deformity. Extremities: No clubbing or cyanosis. No edema. Distal pedal pulses are 2+ and equal bilaterally. Neuro:  AAOx3. Follows commands. Psych:  Responds to questions appropriately with a normal affect.  Labs    High Sensitivity Troponin:  No results for input(s): "TROPONINIHS" in the last 720 hours.    Cardiac EnzymesNo results for input(s): "TROPONINI" in  the last 168 hours. No results for input(s): "TROPIPOC" in the last 168 hours.   Chemistry Recent Labs  Lab 01/15/23 0413 01/16/23 0408 01/17/23 0538  NA 137 137 136  K 4.3 4.7 4.5  CL 104 102 104  CO2 25 25 25   GLUCOSE 106* 108* 104*  BUN 32* 28* 24*  CREATININE 2.18* 1.76* 1.59*  CALCIUM 7.9* 8.2* 8.2*  GFRNONAA 31* 41* 46*  ANIONGAP 8 10 7      Hematology Recent Labs  Lab 01/15/23 0413 01/15/23 1252 01/16/23 0408 01/17/23 0354  WBC 8.4  --  7.2 6.8  RBC 2.72*  --  3.05* 2.82*  HGB 7.8* 8.3* 8.6* 7.9*  HCT 24.7* 26.7* 27.8* 25.3*  MCV 90.8  --  91.1 89.7  MCH 28.7  --  28.2 28.0  MCHC 31.6  --  30.9 31.2  RDW 17.2*  --  17.1* 16.9*  PLT 211  --  223 286    BNP Recent Labs  Lab 01/12/23 0801 01/17/23 0354  BNP 1,837.4* 1,965.9*     DDimer No results for input(s): "DDIMER" in the last 168 hours.   Radiology    No results found.  Cardiac Studies   2d echo 01/14/23  1. Left ventricular ejection  fraction, by estimation, is 25 to 30%. The  left ventricle has severely decreased function. The left ventricle  demonstrates regional wall motion abnormalities with akinesis of the apex  and the peri-apical segments. There is  moderate concentric left ventricular hypertrophy. Left ventricular  diastolic parameters are consistent with Grade I diastolic dysfunction  (impaired relaxation). Large globular 2 x 2.1 cm apical thrombus present.   2. Right ventricular systolic function is mildly reduced. The right  ventricular size is mildly enlarged. There is normal pulmonary artery  systolic pressure. The estimated right ventricular systolic pressure is  33.7 mmHg.   3. Left atrial size was mildly dilated.   4. The mitral valve is normal in structure. Trivial mitral valve  regurgitation. No evidence of mitral stenosis.   5. The aortic valve is tricuspid. There is mild calcification of the  aortic valve. Aortic valve regurgitation is not visualized. No aortic   stenosis is present.   6. Aortic dilatation noted. There is mild dilatation of the ascending  aorta, measuring 38 mm.   7. The inferior vena cava is normal in size with greater than 50%  respiratory variability, suggesting right atrial pressure of 3 mmHg.   Patient Profile     72 y.o. male with severe 2-vessel CAD with CTO of ostial to proximal RCA and mid to distal LAD on cardiac catheterization in 06/2021 (areas not viable on cardiac MRI), ischemic cardiomyopathy with biventricular CHF and LVEF of 25-30% in 06/2022 (patient contemplative but not decisive re: ICD), LV thrombus on echo in 06/2021 (resolved on last echo in 06/2022 but remained on Xarelto PTA, held 3 days prior to surgery), dilated ascending thoracic aorta, hypertension, hyperlipidemia, CKD stage IIIa, and bilateral renal masses suspicious for renal cell carcinoma. Presented to [facility] for planned robotic assisted left partial nephrectomy. Had some peri JP leakage and drop in H/H post-operatively. Contineud to require supplemental O2 with suspected CHF with worsening AKI after diuresis so cardiology consulted. Found to have bilateral PE by VQ and LLE DVT.  Assessment & Plan    1. 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 Acute on chronic HFrEF - current rx: Toprol 50mg  QHS - home Entresto 97/103mg  BID, spironolactone 12.5mg  daily, Jardiance 10mg  daily, torsemide 20mg  daily PRN on hold with AKI - received IV Lasix earlier this admission with rise in Cr which has settled out - based on exam, suspect potential mild volume overload. CXR pending - will await MD input regarding re-introduction of HF regimen/diuresis - addendum: d/w Dr.  Servando Salina. We'll give a one time dose of IV Lasix 40mg  today and reassess labs in AM  3. AKI superimposed on CKD stage 3a - baseline Cr around 1.4-1.5, peaking here at 2.94 - improving, 1.59  4. H/o LV thrombus - re-demonstrated on echocardiogram, will require lifelong anticoagulation  5. Coronary artery disease, dyslipidemia - no angina - on ASA PTA but given concomitant higher dose Xarelto for the time being and fluctuating anemia, seems reasonable to continue to hold off for now and consider resuming as OP - on beta blocker - consider resuming statin though patient reportedly not taking PTA  6. Anemia - slight downtrend today to 7.9, some question of hematuria  overnight now improved - per medicine team  Tentatively scheduled f/u CHF clinic 5/31  For questions or updates, please contact [facility] Please consult [email or web address] for contact info under Cardiology/STEMI.  Signed, Laurann Montana, PA-C 01/17/2023, 7:52 AM    Patient seen and examined, note reviewed with the signed Advanced Practice Provider. I personally reviewed laboratory data, imaging studies and relevant notes. I independently examined the patient and formulated the important aspects of the plan. I have personally discussed the plan with the patient and/or family. Comments or changes to the note/plan are indicated below.  GEN:  Well nourished, well developed in no acute distress HEENT: Mucous membranes moist, good dentition NECK: No JVD; No carotid bruits LYMPHATICS: No lymphadenopathy CARDIAC: S1S2 noted, RRR, no murmurs, rubs, gallops RESPIRATORY: Very mild basilar crackle, No wheezing or rhonchi  ABDOMEN: Soft, non-tender, non-distended, bowel sounds noted, no guarding EXTREMITIES:No cyanosis, no cyanosis, no clubbing MUSCULOSKELETAL: No deformity  SKIN: Warm and dry NEUROLOGIC:  Alert and oriented x 3, nonfocal PSYCHIATRIC:  Normal affect, good insight  Acute on chronic respiratory failure-still on  home oxygen Acute on chronic heart failure with reduced ejection fraction Acute kidney injury on chronic kidney disease stage III History of LV thrombus Coronary artery disease Dyslipidemia Anemia  He has not been able to be titrated off nasal cannula.  Suspect he may need home oxygen.  In the setting of his mild bibasilar crackles he would benefit from one-time dose of Lasix 40 mg today.  Hopefully with this we can try to titrate him off and see how he can tolerate room air.  Blood work will be done in the morning as well. Continue Toprol XL now.  I agree with holding his Entresto Aldactone as well as Jardiance and torsemide in the setting of the AKI. No anginal symptoms at this time.  Agree with holding aspirin while on Xarelto. Anemia may be multifactorial in the setting of anemia chronic disease as well as anticoagulant use.  Continue to trend hemoglobin.   Thomasene Ripple DO, MS [facility] Attending Cardiologist [facility]  [address] [place], [place] [address] [phone] Website: [email or web address]

## 2023-01-17 NOTE — Progress Notes (Signed)
9 Days Post-Op Subjective: Patient was resting in recliner on my arrival.  He was accompanied by his wife.  States that he feels better and has a little less work of breathing.    Objective: Vital signs in last 24 hours: Temp:  [98.2 F (36.8 C)-99.8 F (37.7 C)] 98.5 F (36.9 C) (05/16 0434) Pulse Rate:  [77-91] 77 (05/16 0640) Resp:  [22-23] 22 (05/16 0640) BP: (127-169)/(79-101) 152/89 (05/16 0640) SpO2:  [90 %-94 %] 94 % (05/16 0640) Weight:  [95.7 kg] 95.7 kg (05/16 0500)  Intake/Output from previous day: 05/15 0701 - 05/16 0700 In: 480 [P.O.:480] Out: 2665 [Urine:2600; Drains:65] Intake/Output this shift: I/O last 3 completed shifts: In: 817 [P.O.:717; IV Piggyback:100] Out: 3768 [Urine:3655; Drains:113]   Physical Exam:  General: Alert and oriented CV: RRR Lungs: normal work of breathing Abdomen: Soft, ND.  Incisions: c/d/I GU: voiding spontaneously Ext: NT, No erythema, resolved pitting edema  Lab Results: Recent Labs    01/10/23 1128 01/11/23 0819 01/12/23 0801  HGB 9.2* 8.5* 8.5*  HCT 29.3* 27.2* 27.3*    BMET Recent Labs    01/12/23 0801 01/13/23 0415  NA 131* 130*  K 3.9 4.1  CL 101 98  CO2 21* 22  GLUCOSE 114* 111*  BUN 30* 31*  CREATININE 2.24* 2.69*  CALCIUM 8.0* 8.0*      Studies/Results: DG CHEST PORT 1 VIEW  Result Date: 01/12/2023 CLINICAL DATA:  Shortness of breath. EXAM: PORTABLE CHEST 1 VIEW COMPARISON:  01/11/2023 FINDINGS: Normal heart size. Similar appearance of small left pleural effusion. No interstitial edema or airspace disease. Visualized osseous structures are unremarkable. IMPRESSION: Persistent small left pleural effusion. Electronically Signed   By: Signa Kell M.D.   On: 01/12/2023 07:59   DG Chest 2 View  Result Date: 01/11/2023 CLINICAL DATA:  Shortness of breath. History of CHF and hypertension. EXAM: CHEST - 2 VIEW COMPARISON:  07/24/2021 FINDINGS: Cardiac enlargement. No vascular congestion or edema. Small  left pleural effusion with basilar atelectasis. No pneumothorax. Mediastinal contours appear intact. Degenerative changes in the spine and shoulders. IMPRESSION: 1. Mild cardiac enlargement. 2. Small left pleural effusion with basilar atelectasis. Electronically Signed   By: Burman Nieves M.D.   On: 01/11/2023 15:29    Assessment/Plan: #Left renal mass- s/p partial Nx - J/P drain out - Scr 1.53, improved daily x4 days.  #Acute on Chronic HFrEF - EF- 25% - Appreciate Cardiology assistance - Further diuresis today  # Bilateral PE's - On RA at rest, 5L when ambulating - Of heparin gtt, transitioned to Xarelto. - Transient hematuria - Incentive Spirometry improved to 1000cc from 400.    Elmon Kirschner, NP [facility] [phone]

## 2023-01-18 ENCOUNTER — Other Ambulatory Visit (HOSPITAL_COMMUNITY): Payer: Self-pay

## 2023-01-18 DIAGNOSIS — C649 Malignant neoplasm of unspecified kidney, except renal pelvis: Secondary | ICD-10-CM | POA: Diagnosis not present

## 2023-01-18 DIAGNOSIS — I5020 Unspecified systolic (congestive) heart failure: Secondary | ICD-10-CM | POA: Diagnosis not present

## 2023-01-18 LAB — CBC
HCT: 27.6 % — ABNORMAL LOW (ref 39.0–52.0)
Hemoglobin: 8.8 g/dL — ABNORMAL LOW (ref 13.0–17.0)
MCH: 28.6 pg (ref 26.0–34.0)
MCHC: 31.9 g/dL (ref 30.0–36.0)
MCV: 89.6 fL (ref 80.0–100.0)
Platelets: 335 10*3/uL (ref 150–400)
RBC: 3.08 MIL/uL — ABNORMAL LOW (ref 4.22–5.81)
RDW: 17.1 % — ABNORMAL HIGH (ref 11.5–15.5)
WBC: 8.5 10*3/uL (ref 4.0–10.5)
nRBC: 0.7 % — ABNORMAL HIGH (ref 0.0–0.2)

## 2023-01-18 LAB — BASIC METABOLIC PANEL
Anion gap: 8 (ref 5–15)
BUN: 25 mg/dL — ABNORMAL HIGH (ref 8–23)
CO2: 27 mmol/L (ref 22–32)
Calcium: 8.3 mg/dL — ABNORMAL LOW (ref 8.9–10.3)
Chloride: 101 mmol/L (ref 98–111)
Creatinine, Ser: 1.7 mg/dL — ABNORMAL HIGH (ref 0.61–1.24)
GFR, Estimated: 42 mL/min — ABNORMAL LOW (ref >60)
Glucose, Bld: 104 mg/dL — ABNORMAL HIGH (ref 70–99)
Potassium: 4.4 mmol/L (ref 3.5–5.1)
Sodium: 136 mmol/L (ref 135–145)

## 2023-01-18 LAB — MAGNESIUM: Magnesium: 2.1 mg/dL (ref 1.7–2.4)

## 2023-01-18 MED ORDER — TORSEMIDE 20 MG PO TABS
20.0000 mg | ORAL_TABLET | Freq: Every day | ORAL | 6 refills | Status: AC | PRN
Start: 2023-01-18 — End: ?

## 2023-01-18 MED ORDER — RIVAROXABAN 20 MG PO TABS: 20.0000 mg | ORAL_TABLET | Freq: Every day | ORAL | 1 refills | Status: DC

## 2023-01-18 MED ORDER — RIVAROXABAN (XARELTO) VTE STARTER PACK (15 & 20 MG): ORAL_TABLET | ORAL | 0 refills | Status: DC

## 2023-01-18 MED ORDER — ATORVASTATIN CALCIUM 80 MG PO TABS
80.0000 mg | ORAL_TABLET | Freq: Every day | ORAL | 1 refills | Status: DC
Start: 2023-01-18 — End: 2023-02-24

## 2023-01-18 MED ORDER — EMPAGLIFLOZIN 10 MG PO TABS
10.0000 mg | ORAL_TABLET | Freq: Every day | ORAL | Status: DC
  Administered 2023-01-18: 10 mg via ORAL
  Filled 2023-01-18: qty 1

## 2023-01-18 MED ORDER — RIVAROXABAN (XARELTO) VTE STARTER PACK (15 & 20 MG)
ORAL_TABLET | ORAL | 0 refills | Status: DC
  Filled 2023-01-18 (×2): qty 51, 30d supply, fill #0

## 2023-01-18 NOTE — Progress Notes (Addendum)
Progress Note  Patient Name: Troy May Date of Encounter: 01/18/2023  Primary Cardiologist: Bensimhon (CHF)  Subjective   Feeling well, no complaints. Had good UOP yesterday with IV Lasix. Weight down ?6lb.  Inpatient Medications    Scheduled Meds:  docusate sodium  100 mg Oral BID   metoprolol succinate  50 mg Oral QHS   Rivaroxaban  15 mg Oral BID WC   Followed by   Melene Muller ON 02/06/2023] rivaroxaban  20 mg Oral Q supper   Continuous Infusions:  PRN Meds: mouth rinse, traMADol   Vital Signs    Vitals:   01/17/23 1500 01/17/23 2059 01/18/23 0631 01/18/23 0639  BP:  125/72  124/74  Pulse:  76  60  Resp:  19  17  Temp:  99.1 F (37.3 C)  98.9 F (37.2 C)  TempSrc:  Oral  Oral  SpO2: 98% 93%  97%  Weight:   91.9 kg   Height:   5\' 10"  (1.778 m)     Intake/Output Summary (Last 24 hours) at 01/18/2023 0748 Last data filed at 01/17/2023 2100 Gross per 24 hour  Intake 480 ml  Output 2175 ml  Net -1695 ml      01/18/2023    6:31 AM 01/17/2023    6:31 AM 01/16/2023    6:30 AM  Last 3 Weights  Weight (lbs) 202 lb 9.6 oz 208 lb 8.9 oz 209 lb 7 oz  Weight (kg) 91.9 kg 94.6 kg 95 kg     Telemetry    NSR, rare PVC, one 9 beat NSVT - Personally Reviewed  ECG    No new tracings - Personally Reviewed  Physical Exam   GEN: No acute distress.  HEENT: Normocephalic, atraumatic, sclera non-icteric. Neck: No JVD or bruits. Cardiac: RRR no murmurs, rubs, or gallops.  Respiratory: Clear to auscultation bilaterally. Breathing is unlabored. GI: Soft, nontender, non-distended, BS +x 4. MS: no deformity. Extremities: No clubbing or cyanosis. No edema. Distal pedal pulses are 2+ and equal bilaterally. Neuro:  AAOx3. Follows commands. Psych:  Responds to questions appropriately with a normal affect.  Labs    High Sensitivity Troponin:  No results for input(s): "TROPONINIHS" in the last 720 hours.    Cardiac EnzymesNo results for input(s): "TROPONINI" in the last 168  hours. No results for input(s): "TROPIPOC" in the last 168 hours.   Chemistry Recent Labs  Lab 01/16/23 0408 01/17/23 0538 01/18/23 0417  NA 137 136 136  K 4.7 4.5 4.4  CL 102 104 101  CO2 25 25 27   GLUCOSE 108* 104* 104*  BUN 28* 24* 25*  CREATININE 1.76* 1.59* 1.70*  CALCIUM 8.2* 8.2* 8.3*  GFRNONAA 41* 46* 42*  ANIONGAP 10 7 8      Hematology Recent Labs  Lab 01/16/23 0408 01/17/23 0354 01/18/23 0417  WBC 7.2 6.8 8.5  RBC 3.05* 2.82* 3.08*  HGB 8.6* 7.9* 8.8*  HCT 27.8* 25.3* 27.6*  MCV 91.1 89.7 89.6  MCH 28.2 28.0 28.6  MCHC 30.9 31.2 31.9  RDW 17.1* 16.9* 17.1*  PLT 223 286 335    BNP Recent Labs  Lab 01/12/23 0801 01/17/23 0354  BNP 1,837.4* 1,965.9*     DDimer No results for input(s): "DDIMER" in the last 168 hours.   Radiology    DG CHEST PORT 1 VIEW  Result Date: 01/17/2023 CLINICAL DATA:  Shortness of breath EXAM: PORTABLE CHEST 1 VIEW COMPARISON:  CXR 01/12/23 FINDINGS: No pleural effusion. No pneumothorax. Cardiomegaly. Linear opacity at the left lung  base is favored to represent atelectasis. There is slight prominence of bilateral perihilar interstitial opacities, favored to represent mild pulmonary venous congestion. No radiographically apparent displaced rib fractures. Visualized upper abdomen is unremarkable. IMPRESSION: Cardiomegaly with mild pulmonary venous congestion. Left basilar atelectasis. Electronically Signed   By: Lorenza Cambridge M.D.   On: 01/17/2023 07:57    Cardiac Studies   2d echo 01/14/23  1. Left ventricular ejection fraction, by estimation, is 25 to 30%. The  left ventricle has severely decreased function. The left ventricle  demonstrates regional wall motion abnormalities with akinesis of the apex  and the peri-apical segments. There is  moderate concentric left ventricular hypertrophy. Left ventricular  diastolic parameters are consistent with Grade I diastolic dysfunction  (impaired relaxation). Large globular 2 x 2.1 cm  apical thrombus present.   2. Right ventricular systolic function is mildly reduced. The right  ventricular size is mildly enlarged. There is normal pulmonary artery  systolic pressure. The estimated right ventricular systolic pressure is  33.7 mmHg.   3. Left atrial size was mildly dilated.   4. The mitral valve is normal in structure. Trivial mitral valve  regurgitation. No evidence of mitral stenosis.   5. The aortic valve is tricuspid. There is mild calcification of the  aortic valve. Aortic valve regurgitation is not visualized. No aortic  stenosis is present.   6. Aortic dilatation noted. There is mild dilatation of the ascending  aorta, measuring 38 mm.   7. The inferior vena cava is normal in size with greater than 50%  respiratory variability, suggesting right atrial pressure of 3 mmHg.   Patient Profile     72 y.o. male with severe 2-vessel CAD with CTO of ostial to proximal RCA and mid to distal LAD on cardiac catheterization in 06/2021 (areas not viable on cardiac MRI), ischemic cardiomyopathy with biventricular CHF and LVEF of 25-30% in 06/2022 (patient contemplative but not decisive re: ICD), LV thrombus on echo in 06/2021 (resolved on last echo in 06/2022 but remained on Xarelto PTA, held 3 days prior to surgery), dilated ascending thoracic aorta, hypertension, hyperlipidemia, CKD stage IIIa, and bilateral renal masses suspicious for renal cell carcinoma. Presented to [facility] for planned robotic assisted left partial nephrectomy. Had some peri JP leakage and drop in H/H post-operatively. Contineud to require supplemental O2 with suspected CHF with worsening AKI after diuresis so cardiology consulted. Found to have bilateral PE by VQ and LLE DVT.  Assessment & Plan    1. 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 Msg sent to [facility] pharmD to confirm dosing given current renal function. Per discussion would do the standard 15mg  BID dosing for 21 days, then 20mg  daily thereafter for 3-6 months (per their review this is the recommendation for off label LV thrombus treatment - patient had recurrent LV thrombus this admission as well) - then will need determination of dose reduction contingent on renal function thereafter. Current CrCl 69ml/min. Recommend CHF team review anticoag plan at follow-up as well - will need final assessment for home O2 needs prior to DC   2. Acute on chronic HFrEF - current rx: Toprol 50mg  QHS - home Entresto 97/103mg  BID, spironolactone 12.5mg  daily, Jardiance 10mg  daily, torsemide 20mg  daily PRN on hold with AKI - received IV  Lasix earlier this admission with rise in Cr which has settled out to 1.59 yesterday -> given volume overload and vascular congestion on exam, received IV Lasix 40mg  x 1 yesterday, subsequent Cr 1.7 (prior baseline around 1.3-1.5)- urology feels this may be new baseline - will review med regimen with MD regarding next steps for re-introduction of HF regimen, do not anticipate resumption of all at this time - volume status appears improved this AM. Addendum: per preliminary review with Dr. Servando Salina, restart [place] Pepper, hold on Entresto/Spiro pending OP clinic f/u, OK to use torsemide PRN   3. AKI superimposed on CKD stage 3a - prior baseline Cr around 1.3-1.5, peaking here at 2.94 - as above   4. H/o LV thrombus - re-demonstrated on echocardiogram, will require lifelong anticoagulation - DOAC is off label use for this issue but since LV thrombus had previously resolved with consistent use before re-appearing with break in anticoagulation, seems reasonable to continue to use - recs as above   5. Coronary artery disease, dyslipidemia - no angina - on ASA PTA, await input from primary team  about when OK to resume - on beta blocker - consider resuming statin though patient reportedly not taking PTA   6. Anemia - per medicine team  7. NSVT (brief) - K wnl - check Mg - continue BB Scheduled f/u CHF clinic 5/31 For questions or updates, please contact [facility] Please consult [email or web address] for contact info under Cardiology/STEMI. Signed, Laurann Montana, PA-C 01/18/2023, 7:48 AM    Patient seen and examined, note reviewed with the signed Advanced Practice Provider. I personally reviewed laboratory data, imaging studies and relevant notes. I independently examined the patient and formulated the important aspects of the plan. I have personally discussed the plan with the patient and/or family. Comments or changes to the note/plan are indicated below.  Acute hypoxic respiratory failure Bilateral PE and left leg DVT History of left ventricular thrombus Acute on chronic heart failure with reduced ejection fraction Coronary artery disease Dyslipidemia   Improve clinically, responded well to the one-time dosing of the IV Lasix. Agree with the 15 twice daily dosing for 21 days then 20 mg daily thereafter for up to 6 months for the LV thrombus if GFR/creatinine clearance will remain stable Continue guideline directed medical therapy for now limited to Toprol XL 50 mg at nighttime and continue Jardiance.  Holding Entresto, spironolactone until he has his outpatient visit. Torsemide will as needed.    Thomasene Ripple DO, MS [facility] Attending Cardiologist [facility]  [address] [place], [place] [address] [phone] Website: [email or web address]

## 2023-01-18 NOTE — Discharge Summary (Signed)
Physician Discharge Summary  Raye Bickhart ZOX:[id number] DOB: 30-Jun-1951 DOA: 01/07/2023  PCP: Georgina Quint, MD  Admit date: 01/07/2023 Discharge date: 01/18/2023  Time spent: 40 minutes  Recommendations for Outpatient Follow-up:  Follow outpatient CBC/CMP  Xarelto, needs 3-6 months at 20 mg daily dose -> then needs indefinite anticoagulation with recurrent LV thrombus (consider dose adjustment based on renal function after initial 3-6 month treatment for VTE) Follow with cardiology for adjustment of GDMT - entresto and spironolactone currently on hold.  Torsemide is prn med Follow renal function Follow with urology outpatient   Follow long term O2 needs, wean as able  Discharge Diagnoses:  Principal Problem:   Renal cell carcinoma (HCC) Active Problems:   Essential hypertension   AKI (acute kidney injury) (HCC)   Acute combined systolic and diastolic heart failure (HCC)   Left ventricular apical thrombus   Coronary artery disease of native artery of native heart with stable angina pectoris (HCC)   Dyslipidemia   Renal mass   Type 2 diabetes mellitus (HCC)   Biventricular heart failure (HCC)   Paroxysmal atrial fibrillation (HCC)   Acute on chronic combined systolic and diastolic CHF (congestive heart failure) (HCC)   Acute pulmonary embolism (HCC)   Discharge Condition: stable  Diet recommendation: heart healthy  Filed Weights   01/16/23 0630 01/17/23 0631 01/18/23 0631  Weight: 95 kg 94.6 kg 91.9 kg    History of present illness:   Rainey Iwanski is Kellsey Sansone 72 y.o. male with past medical history of left ventricle mural thrombus, ascending aortic aneurysm, chronic combined systolic and diastolic heart failure, CAD, bilateral renal masses, hyperlipidemia who we are being consulted for dyspnea and new oxygen requirement.  He was admitted for robotic assisted laparoscopic left partial nephrectomy.  He was hypoxic post op.  Initially treated for presumed HF.  VQ scan revealed PE.   He's improving with anticoagulation.  Discharging with supplemental O2 to use with activity.  Echo showed recurrent large LV thrombus.  Cardiology followed for his HF.    Stable for discharge 5/21  See below for additional details    Significant Events 5/10 Robotic assisted left laparoscopic left partial nephrectomy 5/15 [facility] consulted for presumed HF.  Cardiology consulted. 5/16 VQ scan positive for PE 5/17 echo with LV thrombus.  [facility] will take over as primary.  Hospital Course:  Assessment and Plan:  Acute Hypoxic Respiratory Failure  Pulmonary Embolism  Left Leg DVT  Provoked VTE in Setting of Surgery Due to PE Echo as below, mildly reduced RVSF Acute DVT involving L gastrocnemius vein, acute thrombus involving Keevan Wolz perforator vein of the calf  xarelto -> starter pack followed by 3-6 months 20 mg daily, after treatment of VTE/PE -> continue xarelto indefinitely for recurrent LV thrombus (consider dose adjustment based on renal function after VTE has been adequately treated Requiring supplemental O2 with activity, discharge with 2 L with activity   Renal mass Left renal mass Will need urology follow up outpatient S/p surgery   Acute on chronic combined systolic and diastolic heart failure (HCC) Echo with EF 25-30%, grade 1 diastolic dysfunction Repeat echo with EF 25-30%, regional wall motion abnormalities with akinesis of the apex and periapical segments, grade 1 diastolic dysfunction, large globular 2x2.1 cm apical thrombus.  RVSF mildly reduced.    Appreciate cards recs - resume jardiance at discharge, continue metoprolol.  Torsemide only prn for now.  Hold entresto and spironolactone until cards follow up.  I/O, daily weights Appreciate cards recs  AKI (acute kidney injury) (HCC) AKI peaked at 2.94 Fluctuating, follow outpatient  Will monitor, baseline creatinine 1.3-1.4 Renal US without hydro - medical renal disease - solid right renal mass measuring 5.4x5.6x4.7 Strict  I/O, daily weights   Essential hypertension Continue metoprolol succinate 50 mg p.o. daily. Entresto on hold, spironolactone on hold   Left ventricular apical thrombus Xarelto was held prior to procedure. Echo with large globular apical thrombus Currently on heparin gtt -> xarelto  Appreciate cards recs - anticoagulation indefinitely    Coronary artery disease of native artery of  native heart with stable angina pectoris (HCC) On beta-blocker. Statin, aspirin and DOAC on hold.  Holding aspirin at d/c per cards.  Currently on heparin gtt -> xarelto    Dyslipidemia He was not taking lipitor outpatient, unclear reason, refilled    Type 2 diabetes mellitus (HCC)  Jardiance  A1c 6     Procedures: Robotic assisted laparoscopic left partial nephrectomy   Echo IMPRESSIONS     1. Left ventricular ejection fraction, by estimation, is 25 to 30%. The  left ventricle has severely decreased function. The left ventricle  demonstrates regional wall motion abnormalities with akinesis of the apex  and the peri-apical segments. There is  moderate concentric left ventricular hypertrophy. Left ventricular  diastolic parameters are consistent with Grade I diastolic dysfunction  (impaired relaxation). Large globular 2 x 2.1 cm apical thrombus present.   2. Right ventricular systolic function is mildly reduced. The right  ventricular size is mildly enlarged. There is normal pulmonary artery  systolic pressure. The estimated right ventricular systolic pressure is  33.7 mmHg.   3. Left atrial size was mildly dilated.   4. The mitral valve is normal in structure. Trivial mitral valve  regurgitation. No evidence of mitral stenosis.   5. The aortic valve is tricuspid. There is mild calcification of the  aortic valve. Aortic valve regurgitation is not visualized. No aortic  stenosis is present.   6. Aortic dilatation noted. There is mild dilatation of the ascending  aorta, measuring 38 mm.    7. The inferior vena cava is normal in size with greater than 50%  respiratory variability, suggesting right atrial pressure of 3 mmHg.   Summary:  BILATERAL:  -No evidence of popliteal cyst, bilaterally.  RIGHT:  - There is no evidence of deep vein thrombosis in the lower extremity.    LEFT:  - Findings consistent with acute deep vein thrombosis involving the left  gastrocnemius veins.  - Findings consistent with acute thrombosis involving Donato Studley perforator vein of  the calf.   Consultations: Cardiology Urology (initially primary, then [facility] took over)  Discharge Exam: Vitals:   01/18/23 1002 01/18/23 1242  BP:  119/66  Pulse:  64  Resp:  17  Temp:  98 F (36.7 C)  SpO2: 92% 100%   Eager to discharge No complaints today  General: No acute distress. Cardiovascular: Heart sounds show D'Arcy Abraha regular rate, and rhythm. Lungs: Clear to auscultation bilaterally  Abdomen: Soft, nontender, nondistended  Neurological: Alert and oriented 3. Moves all extremities 4 with equal strength. Cranial nerves II through XII grossly intact. Extremities: No clubbing or cyanosis. No edema.   Discharge Instructions   Discharge Instructions     (HEART FAILURE PATIENTS) Call MD:  Anytime you have any of the following symptoms: 1) 3 pound weight gain in 24 hours or 5 pounds in 1 week 2) shortness of breath, with or without Collen Hostler dry hacking cough 3) swelling in the hands,  feet or stomach 4) if you have to sleep on extra pillows at night in order to breathe.   Complete by: As directed    Call MD for:  difficulty breathing, headache or visual disturbances   Complete by: As directed    Call MD for:  extreme fatigue   Complete by: As directed    Call MD for:  hives   Complete by: As directed    Call MD for:  persistant dizziness or light-headedness   Complete by: As directed    Call MD for:  persistant nausea and vomiting   Complete by: As directed    Call MD for:  redness, tenderness, or signs of  infection (pain, swelling, redness, odor or green/yellow discharge around incision site)   Complete by: As directed    Call MD for:  severe uncontrolled pain   Complete by: As directed    Call MD for:  temperature >100.4   Complete by: As directed    Diet - low sodium heart healthy   Complete by: As directed    Discharge instructions   Complete by: As directed    You were admitted for surgery for your renal mass.  You were found to have blood clots in your lung and your left leg.  We've started you on blood thinners.  Continue xarelto 15 mg twice daily for another 19 days (you've already taken 2 days worth).  After 21 days, transition to 20 mg daily.  You'll continue 20 mg daily to complete Taja Pentland 3-6 month course for your blood clots.  After this, discuss with cardiology about whether you need Jameria Bradway dose adjustment based on your renal function to continue lifelong/indefinite anticoagulation for your LV thrombus.  You continue to require oxygen with activity.  This is likely related to your blood clot.  I expect this to improve gradually.  For now, use 2 liters with activity or when you're sleeping.  Follow with your PCP or cardiologist to work on weaning this as an outpatient.   Your kidney function has fluctuated here.  Recently it is closer to your baseline, but still slightly higher.  For now, do NOT take your entresto or spironolactone.  WAIT until you follow up with cardiology to restart these medicines.  CONTINUE your jardiance and metoprolol.  Use your torsemide only AS NEEDED for swelling or weight gain.  Weigh yourself daily, if you gain more than 2-3 lbs in Kerigan Narvaez day or 5 lbs in Tenika Keeran week, take your torsemide and call cardiology for further instructions.  Follow up with urology as planned.  You need repeat labs within Seanpatrick Maisano week, call your PCP to follow up for this.   I'll refill your lipitor.  Follow up with cardiology outpatient regarding this.  We'll stop your aspirin.   Return for new, recurrent,  or worsening symptoms.    Please ask your PCP to request records from this hospitalization so they know what was done and what the next steps will be.   Increase activity slowly   Complete by: As directed       Allergies as of 01/18/2023   No Known Allergies      Medication List     STOP taking these medications    aspirin EC 81 MG tablet   Entresto 97-103 MG Generic drug: sacubitril-valsartan   rivaroxaban 20 MG Tabs tablet Commonly known as: XARELTO Replaced by: Rivaroxaban Starter Pack (15 mg and 20 mg)   spironolactone 25 MG tablet Commonly known as:  ALDACTONE       TAKE these medications    atorvastatin 80 MG tablet Commonly known as: LIPITOR Take 1 tablet (80 mg total) by mouth daily.   empagliflozin 10 MG Tabs tablet Commonly known as: JARDIANCE Take 1 tablet (10 mg total) by mouth daily.   metoprolol succinate 50 MG 24 hr tablet Commonly known as: Toprol XL Take 1 tablet (50 mg total) by mouth at bedtime.   multivitamin with minerals Tabs tablet Take 1 tablet by mouth daily.   Rivaroxaban Starter Pack (15 mg and 20 mg) Commonly known as: XARELTO STARTER PACK Follow package directions: Take one 15mg  tablet by mouth twice Caston Coopersmith day. On day 22, switch to one 20mg  tablet once Nathanie Ottley day. Take with food. Replaces: rivaroxaban 20 MG Tabs tablet   rivaroxaban 20 MG Tabs tablet Commonly known as: XARELTO Take 1 tablet (20 mg total) by mouth daily with supper. (Start this prescription after your finish the xarelto starter pack.  Follow up with cardiology for refills after this. Start taking on: February 15, 2023   torsemide 20 MG tablet Commonly known as: DEMADEX Take 1 tablet (20 mg total) by mouth daily as needed (swelling or weight gain). What changed: reasons to take this   traMADol 50 MG tablet Commonly known as: Ultram Take 1-2 tablets (50-100 mg total) by mouth every 6 (six) hours as needed for moderate pain.               Durable Medical Equipment   (From admission, onward)           Start     Ordered   01/18/23 0830  For home use only DME oxygen  Once       Comments: Patient Saturations on Room Air at Rest = spO2 94%   Patient Saturations on Room Air while Ambulating = sp02 84% .     Patient Saturations on 2 Liters of oxygen while Ambulating = sp02 98%  Question Answer Comment  Length of Need 6 Months   Mode or (Route) Nasal cannula   Liters per Minute 2   Frequency Continuous (stationary and portable oxygen unit needed)   Oxygen conserving device Yes   Oxygen delivery system Gas      01/18/23 0830           No Known Allergies  Follow-up Information     Crist Fat, MD Follow up in 3 month(s).   Specialty: Urology Contact information: [address] [place] [address] [phone]         [facility] Follow up.   Specialty: Cardiology Why: Heart Failure Clinic Follow-up on Friday Jan 28, 2023 11:30 AM. Please arrive 15 minutes prior to appointment time to check in. Contact information: [address] [id number] mc [place] [address] [phone]        [facility] Follow up.   Why: Home Oxygen   [address], [place], [place] [address]        Georgina Quint, MD Follow up.   Specialty: Internal Medicine Contact information: [address] [place] [address] [phone]                  The results of significant diagnostics from this hospitalization (including imaging, microbiology, ancillary and laboratory) are listed below for reference.    Significant Diagnostic Studies: DG CHEST PORT 1 VIEW  Result Date: 01/17/2023 CLINICAL DATA:  Shortness of breath  EXAM: PORTABLE CHEST 1 VIEW COMPARISON:  CXR 01/12/23 FINDINGS: No pleural effusion. No pneumothorax. Cardiomegaly. Linear opacity at the left lung base is favored to represent atelectasis. There is slight prominence of  bilateral perihilar interstitial opacities, favored to represent mild pulmonary venous congestion. No radiographically apparent displaced rib fractures. Visualized upper abdomen is unremarkable. IMPRESSION: Cardiomegaly with mild pulmonary venous congestion. Left basilar atelectasis. Electronically Signed   By: Lorenza Cambridge M.D.   On: 01/17/2023 07:57   VAS [place] LOWER EXTREMITY VENOUS (DVT)  Result Date: 01/14/2023  Lower Venous DVT Study Patient Name:  FATIH SERRATORE  Date of Exam:   01/14/2023 Medical Rec #: [id number]   Accession #:    [id number] Date of Birth: Jul 01, 1951   Patient Gender: M Patient Age:   81 years Exam Location:  [facility] Procedure:      VAS [place] LOWER EXTREMITY VENOUS (DVT) Referring Phys: Tarae Wooden POWELL JR --------------------------------------------------------------------------------  Indications: Pulmonary embolism.  Comparison Study: No previous exams Performing Technologist: Jody Hill RVT, RDMS  Examination Guidelines: Rayland Hamed complete evaluation includes B-mode imaging, spectral Doppler, color Doppler, and power Doppler as needed of all accessible portions of each vessel. Bilateral testing is considered an integral part of Merrianne Mccumbers complete examination. Limited examinations for reoccurring indications may be performed as noted. The reflux portion of the exam is performed with the patient in reverse Trendelenburg.  +---------+---------------+---------+-----------+----------+--------------+ RIGHT    CompressibilityPhasicitySpontaneityPropertiesThrombus Aging +---------+---------------+---------+-----------+----------+--------------+ CFV      Full           Yes      Yes                                 +---------+---------------+---------+-----------+----------+--------------+ SFJ      Full                                                        +---------+---------------+---------+-----------+----------+--------------+ FV Prox  Full           Yes      Yes                                  +---------+---------------+---------+-----------+----------+--------------+ FV Mid   Full           Yes      Yes                                 +---------+---------------+---------+-----------+----------+--------------+ FV DistalFull           Yes      Yes                                 +---------+---------------+---------+-----------+----------+--------------+ PFV      Full                                                        +---------+---------------+---------+-----------+----------+--------------+ POP      Full  Yes      Yes                                 +---------+---------------+---------+-----------+----------+--------------+ PTV      Full                                                        +---------+---------------+---------+-----------+----------+--------------+ PERO     Full                                                        +---------+---------------+---------+-----------+----------+--------------+   +----------+---------------+---------+-----------+----------+--------------+ LEFT      CompressibilityPhasicitySpontaneityPropertiesThrombus Aging +----------+---------------+---------+-----------+----------+--------------+ CFV       Full           Yes      Yes                                 +----------+---------------+---------+-----------+----------+--------------+ SFJ       Full                                                        +----------+---------------+---------+-----------+----------+--------------+ FV Prox   Full           Yes      Yes                                 +----------+---------------+---------+-----------+----------+--------------+ FV Mid    Full           Yes      Yes                                 +----------+---------------+---------+-----------+----------+--------------+ FV Distal Full           Yes      Yes                                  +----------+---------------+---------+-----------+----------+--------------+ PFV       Full                                                        +----------+---------------+---------+-----------+----------+--------------+ POP       Full           Yes      Yes                                 +----------+---------------+---------+-----------+----------+--------------+ Gastroc   None  No       No                   Acute          +----------+---------------+---------+-----------+----------+--------------+ PerforatorNone           No       No                   Acute          +----------+---------------+---------+-----------+----------+--------------+     Summary: BILATERAL: -No evidence of popliteal cyst, bilaterally. RIGHT: - There is no evidence of deep vein thrombosis in the lower extremity.  LEFT: - Findings consistent with acute deep vein thrombosis involving the left gastrocnemius veins. - Findings consistent with acute thrombosis involving Justeen Hehr perforator vein of the calf.  *See table(s) above for measurements and observations. Electronically signed by Gerarda Fraction on 01/14/2023 at 2:54:42 PM.    Final    US RENAL  Result Date: 01/14/2023 CLINICAL DATA:  [id number] AKI (acute kidney injury) (HCC) [id number] EXAM: RENAL / URINARY TRACT ULTRASOUND COMPLETE COMPARISON:  MRI 11/10/2022 FINDINGS: Right Kidney: Renal measurements: 10.8 x 5.2 x 4.9 cm = volume: 145.3 mL. No hydronephrosis. Increased renal echogenicity. There is Mindi Akerson solid mass measuring 5.4 x 5.6 x 4.7 cm, as seen on prior MRI. Left Kidney: Renal measurements: 12.4 x 5.6 x 4.5 cm = volume: 161.3 mL. No hydronephrosis. Increased renal cortical echogenicity. Poorly visualized due to body habitus. History of prior partial left nephrectomy. Bladder: Appears normal for degree of bladder distention. Other: None. IMPRESSION: No hydronephrosis. Increased renal cortical echogenicity bilaterally, as can be seen in medical renal  disease. Solid right renal mass measuring 5.4 x 5.6 x 4.7 cm, as seen on prior MRI, compatible with renal neoplasm. Electronically Signed   By: Caprice Renshaw M.D.   On: 01/14/2023 12:03   ECHOCARDIOGRAM COMPLETE  Result Date: 01/14/2023    ECHOCARDIOGRAM REPORT   Patient Name:   JAASON TONN Date of Exam: 01/14/2023 Medical Rec #:  [id number]  Height:       70.0 in Accession #:    [id number] Weight:       211.0 lb Date of Birth:  1951/01/17  BSA:          2.135 m Patient Age:    72 years   BP:           122/82 mmHg Patient Gender: M          HR:           84 bpm. Exam Location:  Inpatient Procedure: 2D Echo, 3D Echo, Color Doppler, Cardiac Doppler and Intracardiac            Opacification Agent REPORT CONTAINS CRITICAL RESULT Indications:    Pulmonary Embolus  History:        Patient has prior history of Echocardiogram examinations, most                 recent 07/02/2022. CHF, CAD, AKI, Arrythmias:Atrial Fibrillation;                 Risk Factors:Hypertension, Dyslipidemia and Diabetes. H/o apical                 thrombus (off Xarelto since 5/6 for renal mass removal on 5/10).  Sonographer:    Milbert Coulter Referring Phys: [phone] Mika Griffitts CALDWELL POWELL JR IMPRESSIONS  1. Left ventricular ejection fraction, by estimation, is 25 to 30%. The left ventricle has  severely decreased function. The left ventricle demonstrates regional wall motion abnormalities with akinesis of the apex and the peri-apical segments. There is moderate concentric left ventricular hypertrophy. Left ventricular diastolic parameters are consistent with Grade I diastolic dysfunction (impaired relaxation). Large globular 2 x 2.1 cm apical thrombus present.  2. Right ventricular systolic function is mildly reduced. The right ventricular size is mildly enlarged. There is normal pulmonary artery systolic pressure. The estimated right ventricular systolic pressure is 33.7 mmHg.  3. Left atrial size was mildly dilated.  4. The mitral valve is normal in structure.  Trivial mitral valve regurgitation. No evidence of mitral stenosis.  5. The aortic valve is tricuspid. There is mild calcification of the aortic valve. Aortic valve regurgitation is not visualized. No aortic stenosis is present.  6. Aortic dilatation noted. There is mild dilatation of the ascending aorta, measuring 38 mm.  7. The inferior vena cava is normal in size with greater than 50% respiratory variability, suggesting right atrial pressure of 3 mmHg. FINDINGS  Left Ventricle: Left ventricular ejection fraction, by estimation, is 25 to 30%. The left ventricle has severely decreased function. The left ventricle demonstrates regional wall motion abnormalities. Definity contrast agent was given IV to delineate the left ventricular endocardial borders. The left ventricular internal cavity size was normal in size. There is moderate concentric left ventricular hypertrophy. Left ventricular diastolic parameters are consistent with Grade I diastolic dysfunction (impaired relaxation). Right Ventricle: The right ventricular size is mildly enlarged. No increase in right ventricular wall thickness. Right ventricular systolic function is mildly reduced. There is normal pulmonary artery systolic pressure. The tricuspid regurgitant velocity  is 2.77 m/s, and with an assumed right atrial pressure of 3 mmHg, the estimated right ventricular systolic pressure is 33.7 mmHg. Left Atrium: Left atrial size was mildly dilated. Right Atrium: Right atrial size was normal in size. Pericardium: There is no evidence of pericardial effusion. Mitral Valve: The mitral valve is normal in structure. Trivial mitral valve regurgitation. No evidence of mitral valve stenosis. Tricuspid Valve: The tricuspid valve is normal in structure. Tricuspid valve regurgitation is trivial. Aortic Valve: The aortic valve is tricuspid. There is mild calcification of the aortic valve. Aortic valve regurgitation is not visualized. No aortic stenosis is present.  Aortic valve mean gradient measures 5.0 mmHg. Aortic valve peak gradient measures 10.1 mmHg. Aortic valve area, by VTI measures 2.66 cm. Pulmonic Valve: The pulmonic valve was normal in structure. Pulmonic valve regurgitation is not visualized. Aorta: Aortic dilatation noted. There is mild dilatation of the ascending aorta, measuring 38 mm. Venous: The inferior vena cava is normal in size with greater than 50% respiratory variability, suggesting right atrial pressure of 3 mmHg. IAS/Shunts: No atrial level shunt detected by color flow Doppler. Additional Comments: Kamarri Fischetti device lead is visualized in the right ventricle.  LEFT VENTRICLE PLAX 2D LVIDd:         5.30 cm      Diastology LVIDs:         4.10 cm      LV e' medial:    4.24 cm/s LV PW:         1.10 cm      LV E/e' medial:  9.2 LV IVS:        1.10 cm      LV e' lateral:   8.16 cm/s LVOT diam:     2.00 cm      LV E/e' lateral: 4.8 LV SV:  65 LV SV Index:   30 LVOT Area:     3.14 cm  LV Volumes (MOD) LV vol d, MOD A2C: 117.0 ml LV vol d, MOD A4C: 101.0 ml LV vol s, MOD A2C: 72.9 ml LV vol s, MOD A4C: 65.5 ml LV SV MOD A2C:     44.1 ml LV SV MOD A4C:     101.0 ml LV SV MOD BP:      42.2 ml RIGHT VENTRICLE RV Basal diam:  3.40 cm RV Mid diam:    2.50 cm RV S prime:     10.20 cm/s TAPSE (M-mode): 1.6 cm LEFT ATRIUM             Index        RIGHT ATRIUM           Index LA diam:        3.70 cm 1.73 cm/m   RA Area:     14.80 cm LA Vol (A2C):   38.1 ml 17.84 ml/m  RA Volume:   34.40 ml  16.11 ml/m LA Vol (A4C):   36.3 ml 17.00 ml/m LA Biplane Vol: 38.2 ml 17.89 ml/m  AORTIC VALVE AV Area (Vmax):    2.65 cm AV Area (Vmean):   2.43 cm AV Area (VTI):     2.66 cm AV Vmax:           159.00 cm/s AV Vmean:          107.000 cm/s AV VTI:            0.243 m AV Peak Grad:      10.1 mmHg AV Mean Grad:      5.0 mmHg LVOT Vmax:         134.00 cm/s LVOT Vmean:        82.600 cm/s LVOT VTI:          0.206 m LVOT/AV VTI ratio: 0.85  AORTA Ao Root diam: 3.40 cm Ao Asc diam:   3.80 cm MITRAL VALVE               TRICUSPID VALVE MV Area (PHT): 3.08 cm    TR Peak grad:   30.7 mmHg MV Decel Time: 246 msec    TR Vmax:        277.00 cm/s MV E velocity: 38.80 cm/s MV Malissie Musgrave velocity: 74.40 cm/s  SHUNTS MV E/Sharolyn Weber ratio:  0.52        Systemic VTI:  0.21 m                            Systemic Diam: 2.00 cm Dalton McleanMD Electronically signed by Wilfred Lacy Signature Date/Time: 01/14/2023/10:12:47 AM    Final    NM Pulmonary Perfusion  Result Date: 01/13/2023 CLINICAL DATA:  High probability for pulmonary embolism. EXAM: NUCLEAR MEDICINE PERFUSION LUNG SCAN TECHNIQUE: Perfusion images were obtained in multiple projections after intravenous injection of radiopharmaceutical. RADIOPHARMACEUTICALS:  4.2 mCi Tc-18m MAA COMPARISON:  Chest radiograph 11/12/2022 FINDINGS: Multiple peripheral wedge-shaped perfusion defects in LEFT and RIGHT lung. Medium-size peripheral defect in the anterior LEFT lower lobe on LPO projection Smaller peripheral perfusion defect in the anterior RIGHT upper lobe and superior segment of the RIGHT lower lobe. Comparison chest x-ray is clear. IMPRESSION: Bilateral medium size and small peripheral perfusion defects consistent with acute pulmonary embolism. These results will be called to the ordering clinician or representative by the Radiologist Assistant, and communication documented in the PACS or  [facility] Dashboard. Electronically Signed   By: Genevive Bi M.D.   On: 01/13/2023 15:59   DG CHEST PORT 1 VIEW  Result Date: 01/12/2023 CLINICAL DATA:  Shortness of breath. EXAM: PORTABLE CHEST 1 VIEW COMPARISON:  01/11/2023 FINDINGS: Normal heart size. Similar appearance of small left pleural effusion. No interstitial edema or airspace disease. Visualized osseous structures are unremarkable. IMPRESSION: Persistent small left pleural effusion. Electronically Signed   By: Signa Kell M.D.   On: 01/12/2023 07:59   DG Chest 2 View  Result Date: 01/11/2023 CLINICAL DATA:   Shortness of breath. History of CHF and hypertension. EXAM: CHEST - 2 VIEW COMPARISON:  07/24/2021 FINDINGS: Cardiac enlargement. No vascular congestion or edema. Small left pleural effusion with basilar atelectasis. No pneumothorax. Mediastinal contours appear intact. Degenerative changes in the spine and shoulders. IMPRESSION: 1. Mild cardiac enlargement. 2. Small left pleural effusion with basilar atelectasis. Electronically Signed   By: Burman Nieves M.D.   On: 01/11/2023 15:29    Microbiology: No results found for this or any previous visit (from the past 240 hour(s)).   Labs: Basic Metabolic Panel: Recent Labs  Lab 01/14/23 0116 01/15/23 0413 01/16/23 0408 01/17/23 0538 01/18/23 0417  NA 134* 137 137 136 136  K 4.2 4.3 4.7 4.5 4.4  CL 100 104 102 104 101  CO2 24 25 25 25 27   GLUCOSE 124* 106* 108* 104* 104*  BUN 38* 32* 28* 24* 25*  CREATININE 2.94* 2.18* 1.76* 1.59* 1.70*  CALCIUM 8.3* 7.9* 8.2* 8.2* 8.3*  MG  --   --   --   --  2.1   Liver Function Tests: No results for input(s): "AST", "ALT", "ALKPHOS", "BILITOT", "PROT", "ALBUMIN" in the last 168 hours. No results for input(s): "LIPASE", "AMYLASE" in the last 168 hours. No results for input(s): "AMMONIA" in the last 168 hours. CBC: Recent Labs  Lab 01/12/23 0801 01/14/23 0116 01/15/23 0413 01/15/23 1252 01/16/23 0408 01/17/23 0354 01/18/23 0417  WBC 7.8 9.3 8.4  --  7.2 6.8 8.5  NEUTROABS 5.4  --   --   --   --   --   --   HGB 8.5* 9.0* 7.8* 8.3* 8.6* 7.9* 8.8*  HCT 27.3* 28.0* 24.7* 26.7* 27.8* 25.3* 27.6*  MCV 90.4 89.5 90.8  --  91.1 89.7 89.6  PLT 120* 181 211  --  223 286 335   Cardiac Enzymes: No results for input(s): "CKTOTAL", "CKMB", "CKMBINDEX", "TROPONINI" in the last 168 hours. BNP: BNP (last 3 results) Recent Labs    12/17/22 0936 01/12/23 0801 01/17/23 0354  BNP 545.9* 1,837.4* 1,965.9*    ProBNP (last 3 results) No results for input(s): "PROBNP" in the last 8760  hours.  CBG: Recent Labs  Lab 01/11/23 1701 01/11/23 2034 01/11/23 2359 01/12/23 0421 01/12/23 0745  GLUCAP 134* 148* 108* 109* 110*       Signed:  Lacretia Nicks MD.  [facility] 01/18/2023, 2:44 PM

## 2023-01-18 NOTE — Plan of Care (Signed)
  Problem: Bowel/Gastric: Goal: Gastrointestinal status for postoperative course will improve Outcome: Progressing   Problem: Clinical Measurements: Goal: Postoperative complications will be avoided or minimized Outcome: Progressing   Problem: Respiratory: Goal: Ability to achieve and maintain a regular respiratory rate will improve Outcome: Progressing   Problem: Skin Integrity: Goal: Demonstration of wound healing without infection will improve Outcome: Progressing   Problem: Urinary Elimination: Goal: Ability to avoid or minimize complications of infection will improve Outcome: Progressing   Problem: Health Behavior/Discharge Planning: Goal: Ability to manage health-related needs will improve Outcome: Progressing   Problem: Clinical Measurements: Goal: Will remain free from infection Outcome: Progressing Goal: Diagnostic test results will improve Outcome: Progressing Goal: Respiratory complications will improve Outcome: Progressing   Problem: Activity: Goal: Risk for activity intolerance will decrease Outcome: Progressing   Problem: Nutrition: Goal: Adequate nutrition will be maintained Outcome: Progressing   Problem: Safety: Goal: Ability to remain free from injury will improve Outcome: Progressing   Problem: Skin Integrity: Goal: Risk for impaired skin integrity will decrease Outcome: Progressing   Problem: Fluid Volume: Goal: Ability to maintain a balanced intake and output will improve Outcome: Progressing

## 2023-01-18 NOTE — TOC Initial Note (Signed)
Transition of Care [facility]) - Initial/Assessment Note    Patient Details  Name: Troy May MRN: [id number] Date of Birth: 01/16/1951  [facility]) CM/SW Contact:    Howell Rucks, RN Phone Number: 01/18/2023, 11:15 AM  Clinical Narrative:  Met with pt and spouse at bedside, introduced role of TOC/NCM and review for dc needs. 02 delivered to room, pt/spouse has brochure for contact, [employer] added to AVS. Pt/spouse reports no home DME, spouse to provide transport home. No further TOC needs identified                Expected Discharge Plan: Home/Self Care Barriers to Discharge: Continued Medical Work up   Patient Goals and CMS Choice Patient states their goals for this hospitalization and ongoing recovery are:: Home          Expected Discharge Plan and Services   Discharge Planning Services: CM Consult                     DME Arranged: Oxygen   Date DME Agency Contacted: 01/18/23 Time DME Agency Contacted: 1042 Representative spoke with at DME Agency: Vaughan Basta            Prior Living Arrangements/Services   Lives with:: Spouse   Do you feel safe going back to the place where you live?: Yes      Need for Family Participation in Patient Care: Yes (Comment) Care giver support system in place?: Yes (comment)   Criminal Activity/Legal Involvement Pertinent to Current Situation/Hospitalization: No - Comment as needed  Activities of Daily Living Home Assistive Devices/Equipment: None ADL Screening (condition at time of admission) Patient's cognitive ability adequate to safely complete daily activities?: Yes Is the patient deaf or have difficulty hearing?: No Does the patient have difficulty seeing, even when wearing glasses/contacts?: No Does the patient have difficulty concentrating, remembering, or making decisions?: No Patient able to express need for assistance with ADLs?: Yes Does the patient have difficulty dressing or bathing?: No Independently  performs ADLs?: Yes (appropriate for developmental age) Does the patient have difficulty walking or climbing stairs?: No Weakness of Legs: None Weakness of Arms/Hands: None  Permission Sought/Granted Permission sought to share information with : Case Manager Permission granted to share information with : Yes, Verbal Permission Granted  Share Information with NAME: Fannie Knee, RN           Emotional Assessment Appearance:: Appears stated age Attitude/Demeanor/Rapport: Gracious Affect (typically observed): Accepting Orientation: : Oriented to Self, Oriented to Place, Oriented to  Time, Oriented to Situation Alcohol / Substance Use: Not Applicable Psych Involvement: No (comment)  Admission diagnosis:  Renal mass [N28.89] Left renal mass [N28.89] Patient Active Problem List   Diagnosis Date Noted   Renal cell carcinoma (HCC) 01/14/2023   Acute pulmonary embolism (HCC) 01/14/2023   Type 2 diabetes mellitus (HCC) 01/12/2023   Biventricular heart failure (HCC) 01/12/2023   Paroxysmal atrial fibrillation (HCC) 01/12/2023   Acute on chronic combined systolic and diastolic CHF (congestive heart failure) (HCC) 01/12/2023   Renal mass 01/07/2023   Dyslipidemia 08/31/2022   Coronary artery disease of native artery of native heart with stable angina pectoris (HCC) 02/08/2022   Chronic systolic heart failure (HCC) 08/05/2021   NSTEMI (non-ST elevated myocardial infarction) (HCC) 07/25/2021   Essential hypertension 07/25/2021   AKI (acute kidney injury) (HCC) 07/25/2021   Acute combined systolic and diastolic heart failure (HCC)    Left ventricular apical thrombus    PCP:  Edwina Barth  Elita Quick, MD Pharmacy:   [facility] [address], [place] - [address] [place] [address] Phone: [phone] Fax: [phone]  [facility] [address] [place] [address] Phone: [phone] Fax: [phone]  [place] [facility] [address] [place] [address] Phone: [phone] Fax: [phone]  [facility] [place], [place] - [address] [place] [address] Phone: [phone] Fax: [phone]     Social Determinants of Health (SDOH) Social History: SDOH Screenings   Food Insecurity: No Food Insecurity (01/07/2023)  Housing: Low Risk  (01/07/2023)  Transportation Needs: No Transportation Needs (01/07/2023)  Utilities: Not At Risk (01/07/2023)  Alcohol Screen: Low Risk  (09/03/2022)  Depression (PHQ2-9): Low Risk  (09/03/2022)  Financial Resource Strain: Low Risk  (09/03/2022)  Physical Activity: Sufficiently Active (09/03/2022)  Social Connections: Socially Integrated (09/03/2022)  Stress: No Stress Concern Present (09/03/2022)  Tobacco Use: Medium Risk (01/08/2023)   SDOH Interventions:     Readmission Risk Interventions    01/18/2023   10:40 AM  Readmission Risk Prevention Plan  Post Dischage Appt Complete  Medication Screening Complete  Transportation Screening Complete

## 2023-01-18 NOTE — Progress Notes (Signed)
Mobility Specialist - Progress Note   01/18/23 1123  Oxygen Therapy  O2 Device Nasal Cannula  O2 Flow Rate (L/min) 2 L/min  Mobility  Activity Ambulated independently in hallway  Level of Assistance Independent  Assistive Device None  Distance Ambulated (ft) 200 ft  Activity Response Tolerated well  Mobility Referral Yes  $Mobility charge 1 Mobility  Mobility Specialist Start Time (ACUTE ONLY) 1115  Mobility Specialist Stop Time (ACUTE ONLY) 1122  Mobility Specialist Time Calculation (min) (ACUTE ONLY) 7 min   Pt received in recliner and agreeable to mobility. No complaints during session. Pt to recliner after session with all needs met.    [facility]

## 2023-01-18 NOTE — Progress Notes (Signed)
Urology Inpatient Progress Report  Renal mass [N28.89] Left renal mass [N28.89]  Procedure(s): XI ROBOTIC ASSITED LEFT PARTIAL NEPHRECTOMY  11 Days Post-Op   Intv/Subj: Doing well, persistent O2 requirement No more hematuria No pain Good appetite    Principal Problem:   Renal cell carcinoma (HCC) Active Problems:   Essential hypertension   AKI (acute kidney injury) (HCC)   Acute combined systolic and diastolic heart failure (HCC)   Left ventricular apical thrombus   Coronary artery disease of native artery of native heart with stable angina pectoris (HCC)   Dyslipidemia   Renal mass   Type 2 diabetes mellitus (HCC)   Biventricular heart failure (HCC)   Paroxysmal atrial fibrillation (HCC)   Acute on chronic combined systolic and diastolic CHF (congestive heart failure) (HCC)   Acute pulmonary embolism (HCC)  Current Facility-Administered Medications  Medication Dose Route Frequency Provider Last Rate Last Admin   docusate sodium (COLACE) capsule 100 mg  100 mg Oral BID Emmerling, Michael, MD   100 mg at 01/17/23 2148   metoprolol succinate (TOPROL-XL) 24 hr tablet 50 mg  50 mg Oral QHS Carlus Pavlov, MD   50 mg at 01/17/23 2147   Oral care mouth rinse  15 mL Mouth Rinse PRN Zigmund Daniel., MD       Rivaroxaban (XARELTO) tablet 15 mg  15 mg Oral BID WC Pham, Anh P, RPH   15 mg at 01/17/23 1829   Followed by   Melene Muller ON 02/06/2023] rivaroxaban (XARELTO) tablet 20 mg  20 mg Oral Q supper Pham, Anh P, RPH       traMADol (ULTRAM) tablet 50-100 mg  50-100 mg Oral Q6H PRN Crist Fat, MD   50 mg at 01/15/23 1143     Objective: Vital: Vitals:   01/17/23 1500 01/17/23 2059 01/18/23 0631 01/18/23 0639  BP:  125/72  124/74  Pulse:  76  60  Resp:  19  17  Temp:  99.1 F (37.3 C)  98.9 F (37.2 C)  TempSrc:  Oral  Oral  SpO2: 98% 93%  97%  Weight:   91.9 kg   Height:   5\' 10"  (1.778 m)    I/Os: I/O last 3 completed shifts: In: 480 [P.O.:480] Out:  3300 [Urine:3300]  Physical Exam:  General: Patient is in no apparent distress Lungs: Normal respiratory effort, chest expands symmetrically.  Persistent O2 requirement (4L) GI: Incisions are c/d/i. Bruising from around incisions  The abdomen is soft and nontender without mass. Ext: lower extremities symmetric  Lab Results: Recent Labs    01/16/23 0408 01/17/23 0354 01/18/23 0417  WBC 7.2 6.8 8.5  HGB 8.6* 7.9* 8.8*  HCT 27.8* 25.3* 27.6*   Recent Labs    01/16/23 0408 01/17/23 0538 01/18/23 0417  NA 137 136 136  K 4.7 4.5 4.4  CL 102 104 101  CO2 25 25 27   GLUCOSE 108* 104* 104*  BUN 28* 24* 25*  CREATININE 1.76* 1.59* 1.70*  CALCIUM 8.2* 8.2* 8.3*   No results for input(s): "LABPT", "INR" in the last 72 hours. No results for input(s): "LABURIN" in the last 72 hours. Results for orders placed or performed during the hospital encounter of 07/24/21  Resp Panel by RT-PCR (Flu A&B, Covid) Nasopharyngeal Swab     Status: Abnormal   Collection Time: 07/24/21  7:56 PM   Specimen: Nasopharyngeal Swab; Nasopharyngeal(NP) swabs in vial transport medium  Result Value Ref Range Status   SARS Coronavirus 2 by RT PCR  NEGATIVE NEGATIVE Final    Comment: (NOTE) SARS-CoV-2 target nucleic acids are NOT DETECTED.  The SARS-CoV-2 RNA is generally detectable in upper respiratory specimens during the acute phase of infection. The lowest concentration of SARS-CoV-2 viral copies this assay can detect is 138 copies/mL. A negative result does not preclude SARS-Cov-2 infection and should not be used as the sole basis for treatment or other patient management decisions. A negative result may occur with  improper specimen collection/handling, submission of specimen other than nasopharyngeal swab, presence of viral mutation(s) within the areas targeted by this assay, and inadequate number of viral copies(<138 copies/mL). A negative result must be combined with clinical observations, patient  history, and epidemiological information. The expected result is Negative.  Fact Sheet for Patients:  BloggerCourse.com  Fact Sheet for Healthcare Providers:  SeriousBroker.it  This test is no t yet approved or cleared by the [place] FDA and  has been authorized for detection and/or diagnosis of SARS-CoV-2 by FDA under an Emergency Use Authorization (EUA). This EUA will remain  in effect (meaning this test can be used) for the duration of the COVID-19 declaration under Section 564(b)(1) of the Act, 21 U.S.C.section 360bbb-3(b)(1), unless the authorization is terminated  or revoked sooner.       Influenza A by PCR POSITIVE (A) NEGATIVE Final   Influenza B by PCR NEGATIVE NEGATIVE Final    Comment: (NOTE) The Xpert Xpress SARS-CoV-2/FLU/RSV plus assay is intended as an aid in the diagnosis of influenza from Nasopharyngeal swab specimens and should not be used as a sole basis for treatment. Nasal washings and aspirates are unacceptable for Xpert Xpress SARS-CoV-2/FLU/RSV testing.  Fact Sheet for Patients: BloggerCourse.com  Fact Sheet for Healthcare Providers: SeriousBroker.it  This test is not yet approved or cleared by the [place] FDA and has been authorized for detection and/or diagnosis of SARS-CoV-2 by FDA under an Emergency Use Authorization (EUA). This EUA will remain in effect (meaning this test can be used) for the duration of the COVID-19 declaration under Section 564(b)(1) of the Act, 21 U.S.C. section 360bbb-3(b)(1), unless the authorization is terminated or revoked.  Performed at [facility], [address], [place], [place] [address]     Studies/Results: [place] CHEST PORT 1 VIEW  Result Date: 01/17/2023 CLINICAL DATA:  Shortness of breath EXAM: PORTABLE CHEST 1 VIEW COMPARISON:  CXR 01/12/23 FINDINGS: No pleural effusion. No pneumothorax.  Cardiomegaly. Linear opacity at the left lung base is favored to represent atelectasis. There is slight prominence of bilateral perihilar interstitial opacities, favored to represent mild pulmonary venous congestion. No radiographically apparent displaced rib fractures. Visualized upper abdomen is unremarkable. IMPRESSION: Cardiomegaly with mild pulmonary venous congestion. Left basilar atelectasis. Electronically Signed   By: Lorenza Cambridge M.D.   On: 01/17/2023 07:57    Assessment: Procedure(s): XI ROBOTIC ASSITED LEFT PARTIAL NEPHRECTOMY, 11 Days Post-Op  doing well. T1b left Renal cell carcinoma - margins negative, excellent prognosis Renal function back to what I would expect to be close to his new baseline Anemia stable PEs treated on Xarelto with stable hemoglobin  Plan: He is cleared for discharge from my prospective if we can set up home O2. I have spoken with him about discharge instructions and f/u.  Thank you to both cardiology and Dr. Lowell Guitar for their help with this patient.  Berniece Salines, MD Urology 01/18/2023, 7:35 AM

## 2023-01-19 ENCOUNTER — Telehealth: Payer: Self-pay

## 2023-01-19 NOTE — Transitions of Care (Post Inpatient/ED Visit) (Signed)
01/19/2023  Name: Troy May MRN: [id number] DOB: Jul 11, 1951  Today's TOC FU Call Status: Today's TOC FU Call Status:: Successful TOC FU Call Competed TOC FU Call Complete Date: 01/19/23  Transition Care Management Follow-up Telephone Call Date of Discharge: 01/18/23 Discharge Facility: [facility]) Type of Discharge: Inpatient Admission Primary Inpatient Discharge Diagnosis:: Renal cell carcinoma How have you been since you were released from the hospital?: Better Any questions or concerns?: No  Items Reviewed: Did you receive and understand the discharge instructions provided?: Yes Medications obtained,verified, and reconciled?: Yes (Medications Reviewed) Any new allergies since your discharge?: No Dietary orders reviewed?: NA Do you have support at home?: Yes People in Home: spouse  Medications Reviewed Today: Medications Reviewed Today     Reviewed by Leigh Aurora, CMA (Certified Medical Assistant) on 01/19/23 at 1328  Med List Status: <None>   Medication Order Taking? Sig Documenting Provider Last Dose Status Informant  atorvastatin (LIPITOR) 80 MG tablet [id number]  Take 1 tablet (80 mg total) by mouth daily. Zigmund Daniel., MD  Active   empagliflozin (JARDIANCE) 10 MG TABS tablet [id number] No Take 1 tablet (10 mg total) by mouth daily. Andrey Farmer, [place] 01/03/2023 Active Spouse/Significant Other  metoprolol succinate (TOPROL XL) 50 MG 24 hr tablet [id number] No Take 1 tablet (50 mg total) by mouth at bedtime. Bensimhon, Bevelyn Buckles, MD 01/06/2023 Active Spouse/Significant Other  Multiple Vitamin (MULTIVITAMIN WITH MINERALS) TABS tablet [id number] No Take 1 tablet by mouth daily. [provider] 01/06/2023 Active Spouse/Significant Other  rivaroxaban (XARELTO) 20 MG TABS tablet [id number]  Take 1 tablet (20 mg total) by mouth daily with supper. (Start this prescription after your finish the xarelto starter pack.  Follow up with cardiology for refills  after this. Zigmund Daniel., MD  Active   RIVAROXABAN Carlena Hurl) VTE STARTER PACK [phone] & 20 MG) [id number]  Follow package directions: Take one 15mg  tablet by mouth twice a day. On day 22, switch to one 20mg  tablet once a day. Take with food. Zigmund Daniel., MD  Active   torsemide [facility]) 20 MG tablet [id number]  Take 1 tablet (20 mg total) by mouth daily as needed (swelling or weight gain). Zigmund Daniel., MD  Active   traMADol Janean Sark) 50 MG tablet [id number]  Take 1-2 tablets (50-100 mg total) by mouth every 6 (six) hours as needed for moderate pain. Crist Fat, MD  Active             Home Care and Equipment/Supplies: Were Home Health Services Ordered?: NA Any new equipment or medical supplies ordered?: Yes Name of Medical supply agency?: [employer] Were you able to get the equipment/medical supplies?: Yes Do you have any questions related to the use of the equipment/supplies?: No  Functional Questionnaire: Do you need assistance with bathing/showering or dressing?: No Do you need assistance with meal preparation?: No Do you need assistance with eating?: No Do you have difficulty maintaining continence: No Do you need assistance with getting out of bed/getting out of a chair/moving?: No Do you have difficulty managing or taking your medications?: No  Follow up appointments reviewed: PCP Follow-up appointment confirmed?: Yes Date of PCP follow-up appointment?: 02/08/23 (pt wife is unable to bring pt in sooner due to other appts and work schedule (needs an appt after 02/04/23 when school is out for summer).) Follow-up Provider: Dr. Ardeen Jourdain Follow-up appointment confirmed?: Yes Date of Specialist follow-up appointment?: 04/18/23 Follow-Up Specialty Provider:: Dr.  Herrick-Urology Do you need transportation to your follow-up appointment?: No Do you understand care options if your condition(s) worsen?: Yes-patient verbalized  understanding    SIGNATURE  Agnes Lawrence, CMA (AAMA)  [facility] [phone]

## 2023-01-20 ENCOUNTER — Other Ambulatory Visit (HOSPITAL_COMMUNITY): Payer: Self-pay | Admitting: Physician Assistant

## 2023-01-20 ENCOUNTER — Telehealth: Payer: Self-pay | Admitting: Emergency Medicine

## 2023-01-20 DIAGNOSIS — I5082 Biventricular heart failure: Secondary | ICD-10-CM

## 2023-01-20 NOTE — Telephone Encounter (Signed)
Called patient's wife and informed her that patient needs to come in for a Hosp f/u to discuss home oxygen needs. R/s appt that was for 6/11 to 5/30 to see the provider for a hosp f/u

## 2023-01-20 NOTE — Telephone Encounter (Signed)
Patient would like an order for a portable oxygen unit to be sent to [facility] - Phone:  [phone]

## 2023-01-27 ENCOUNTER — Ambulatory Visit: Payer: Medicare Other | Admitting: Emergency Medicine

## 2023-01-27 ENCOUNTER — Encounter: Payer: Self-pay | Admitting: Emergency Medicine

## 2023-01-27 VITALS — BP 150/88 | HR 59 | Temp 98.5°F | Ht 70.0 in | Wt 205.2 lb

## 2023-01-27 DIAGNOSIS — I10 Essential (primary) hypertension: Secondary | ICD-10-CM

## 2023-01-27 DIAGNOSIS — Z09 Encounter for follow-up examination after completed treatment for conditions other than malignant neoplasm: Secondary | ICD-10-CM

## 2023-01-27 DIAGNOSIS — I2699 Other pulmonary embolism without acute cor pulmonale: Secondary | ICD-10-CM

## 2023-01-27 DIAGNOSIS — I5022 Chronic systolic (congestive) heart failure: Secondary | ICD-10-CM

## 2023-01-27 DIAGNOSIS — N2889 Other specified disorders of kidney and ureter: Secondary | ICD-10-CM

## 2023-01-27 LAB — COMPREHENSIVE METABOLIC PANEL
ALT: 10 U/L (ref 0–53)
AST: 19 U/L (ref 0–37)
Albumin: 3.5 g/dL (ref 3.5–5.2)
Alkaline Phosphatase: 78 U/L (ref 39–117)
BUN: 15 mg/dL (ref 6–23)
CO2: 29 mEq/L (ref 19–32)
Calcium: 8.4 mg/dL (ref 8.4–10.5)
Chloride: 107 mEq/L (ref 96–112)
Creatinine, Ser: 1.45 mg/dL (ref 0.40–1.50)
GFR: 48.3 mL/min — ABNORMAL LOW (ref >60.00)
Glucose, Bld: 101 mg/dL — ABNORMAL HIGH (ref 70–99)
Potassium: 4 mEq/L (ref 3.5–5.1)
Sodium: 142 mEq/L (ref 135–145)
Total Bilirubin: 0.5 mg/dL (ref 0.2–1.2)
Total Protein: 7 g/dL (ref 6.0–8.3)

## 2023-01-27 LAB — CBC WITH DIFFERENTIAL/PLATELET
Basophils Absolute: 0 10*3/uL (ref 0.0–0.1)
Basophils Relative: 0.7 % (ref 0.0–3.0)
Eosinophils Absolute: 0.1 10*3/uL (ref 0.0–0.7)
Eosinophils Relative: 1.6 % (ref 0.0–5.0)
HCT: 32.5 % — ABNORMAL LOW (ref 39.0–52.0)
Hemoglobin: 10.5 g/dL — ABNORMAL LOW (ref 13.0–17.0)
Lymphocytes Relative: 28.8 % (ref 12.0–46.0)
Lymphs Abs: 1.9 10*3/uL (ref 0.7–4.0)
MCHC: 32.3 g/dL (ref 30.0–36.0)
MCV: 90.3 fl (ref 78.0–100.0)
Monocytes Absolute: 0.5 10*3/uL (ref 0.1–1.0)
Monocytes Relative: 7.7 % (ref 3.0–12.0)
Neutro Abs: 4 10*3/uL (ref 1.4–7.7)
Neutrophils Relative %: 61.2 % (ref 43.0–77.0)
Platelets: 352 10*3/uL (ref 150.0–400.0)
RBC: 3.6 Mil/uL — ABNORMAL LOW (ref 4.22–5.81)
RDW: 19.2 % — ABNORMAL HIGH (ref 11.5–15.5)
WBC: 6.5 10*3/uL (ref 4.0–10.5)

## 2023-01-27 NOTE — Patient Instructions (Signed)
Pulmonary Embolism  A pulmonary embolism (PE) is a sudden blockage or decrease of blood flow in one or both lungs that happens when a clot travels into the arteries of the lung (pulmonary arteries). Most blockages come from a blood clot that forms in the vein of a leg or arm (deep vein thrombosis, DVT) and travels to the lungs. A clot is blood that has thickened into a gel or solid. PE is a dangerous and life-threatening condition that needs to be treated right away. What are the causes? This condition is usually caused by a blood clot that forms in a vein and moves to the lungs. In rare cases, it may be caused by air, fat, part of a tumor, or other tissue that moves through the veins and into the lungs. What increases the risk? The following factors may make you more likely to develop this condition: Experiencing a traumatic injury, such as breaking a hip or leg. Having: A spinal cord injury. Major surgery, especially hip or knee replacement, or surgery on parts of the nervous system or on the abdomen. A stroke. A blood-clotting disease. Long-term (chronic) lung or heart disease. Cancer, especially if you are being treated with chemotherapy. A central venous catheter. Taking medicines that contain estrogen. These include birth control pills and hormone replacement therapy. Being: Pregnant. In the period of time after your baby is delivered (postpartum). Older than age 82. Overweight. A smoker, especially if you have other risks. Not very active (sedentary), not being able to move at all, or spending long periods sitting, such as travel over 6 hours. You are also at a greater risk if you have a leg in a cast or splint. What are the signs or symptoms? Symptoms of this condition usually start suddenly and include: Shortness of breath during activity or at rest. Coughing, coughing up blood, or coughing up bloody mucus. Chest pain, back pain, or shoulder blade pain that gets worse with deep  breaths. Rapid or irregular heartbeat. Feeling light-headed or dizzy, or fainting. Feeling anxious. Pain and swelling in a leg. This is a symptom of DVT, which can lead to PE. How is this diagnosed? This condition may be diagnosed based on your medical history, a physical exam, and tests. Tests may include: Blood tests. An ECG (electrocardiogram) of the heart. A CT pulmonary angiogram. This test checks blood flow in and around your lungs. A ventilation-perfusion scan, also called a lung VQ scan. This test measures air flow and blood flow to the lungs. An ultrasound to check for a DVT. How is this treated? Treatment for this condition depends on many factors, such as the cause of your PE, your risk for bleeding or developing more clots, and other medical conditions you may have. Treatment aims to stop blood clots from forming or growing larger. In some cases, treatment may be aimed at breaking apart or removing the blood clot. Treatment may include: Medicines, such as: Blood thinning medicines, also called anticoagulants, to stop clots from forming and growing. Medicines that break apart clots (fibrinolytics). Procedures, such as: Using a flexible tube to remove a blood clot (embolectomy) or to deliver medicine to destroy it (catheter-directed thrombolysis). Surgery to remove the clot (surgical embolectomy). This is rare. You may need a combination of immediate, long-term, and extended treatments. Your treatment may continue for several months (maintenance therapy) or longer depending on your medical conditions. You and your health care provider will work together to choose the treatment program that is best for you.  Follow these instructions at home: Medicines Take over-the-counter and prescription medicines only as told by your health care provider. If you are taking blood thinners: Talk with your health care provider before you take any medicines that contain aspirin or NSAIDs, such as  ibuprofen. These medicines increase your risk for dangerous bleeding. Take your medicine exactly as told, at the same time every day. Avoid activities that could cause injury or bruising, and follow instructions about how to prevent falls. Wear a medical alert bracelet or carry a card that lists what medicines you take. Understand what foods and drugs interact with any medicines that you are taking. General instructions Ask your health care provider when you may return to your normal activities. Avoid sitting or lying for a long time without moving. Maintain a healthy weight. Ask your health care provider what weight is healthy for you. Do not use any products that contain nicotine or tobacco. These products include cigarettes, chewing tobacco, and vaping devices, such as e-cigarettes. If you need help quitting, ask your health care provider. Talk with your health care provider about any travel plans. It is important to make sure that you are still able to take your medicine while traveling. Keep all follow-up visits. This is important. Where to find more information American Lung Association: www.lung.org Centers for Disease Control and Prevention: [employer]Ex[employer].com.br Contact a health care provider if: You missed a dose of your blood thinner medicine. You have a fever. Get help right away if: You have: New or increased pain, swelling, warmth, or redness in an arm or leg. Shortness of breath that gets worse during activity or at rest. Worsening chest pain. A rapid or irregular heartbeat. A severe headache. Vision changes. A serious fall or accident, or you hit your head. Blood in your vomit, stool, or urine. A cut that will not stop bleeding. You cough up blood. You feel light-headed or dizzy, and that feeling does not go away. You cannot move your arms or legs. You are confused or have memory loss. These symptoms may represent a serious problem that is an emergency. Do not wait to see if the  symptoms will go away. Get medical help right away. Call your local emergency services (911 in the [place] Do not drive yourself to the hospital. Summary A pulmonary embolism (PE) is a serious and potentially life-threatening condition. It happens when a blood clot from one part of the body travels to the arteries of the lung, causing a sudden blockage or decrease of blood flow to the lungs. This may result in shortness of breath, chest pain, dizziness, and fainting. Treatments for this condition usually include medicines to thin your blood (anticoagulants) or medicines to break apart blood clots. If you are given blood thinners, take your medicine exactly as told by your health care provider, at the same time every day. This is important. Understand what foods and drugs interact with any medicines that you are taking. If you have signs of PE or DVT, call your local emergency services (911 in the [place] This information is not intended to replace advice given to you by your health care provider. Make sure you discuss any questions you have with your health care provider. Document Revised: 07/18/2020 Document Reviewed: 07/18/2020 Elsevier Patient Education  2024 [employer].

## 2023-01-27 NOTE — Assessment & Plan Note (Signed)
BP Readings from Last 3 Encounters:  01/27/23 (!) 150/88  01/18/23 119/66  12/31/22 138/82  Continue metoprolol succinate 50 mg daily Entresto and Aldactone were held after discharge Cardiology appointment tomorrow

## 2023-01-27 NOTE — Assessment & Plan Note (Signed)
Pathology report negative for cancer

## 2023-01-27 NOTE — Progress Notes (Signed)
[facility]  NOTE  Primary Care: Edwina Barth, MD HF Cardiologist:  Dr. Gala Romney  HPI: Troy May is a 72 y.o. male w/ HTN, HLD, CAD, and BiV CHF/iCM.  Admitted 11/22 w/ CP, Influenza A+. Hypertensive on arrival requiring labetalol gtt, CT chest - PE but + for cardiomegaly, coronary CA2+, mild aneurysmal dilatation of the ascending thoracic aorta 43 mm; bilateral renal masses suspicious for RCC.  Echo 11/22 EF 25-30% w/LV apical thrombus G2DD, RV moderately HK. R/LHC severe 2V CAD w/ occluded LAD & RCA,  elevated filling pressures w/ low output, CI 1.7. No good options for revasc -> med rx. cMRI LVEF 21%, LAD/RCA territories with scar and do not appear viable, large LV thrombus. Discharge weight 172 lbs.  Echo 3/23: EF 25-30%, + apical thrombus, moderate MR/TR  Follow up 3/23. Toprol XL 25 added. Referred to EP for ICD. Saw Dr. Graciela Husbands for evaluation 12/11/21. Wanted to think about ICD.  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 Follows with Urology q 6 months for surveillance.  Follow up 6/23, off all meds for a few weeks. GDMT restarted. Declined ICD.  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 Post op complicated by acute hypoxic respiratory failure. VQ scan revealed PE. Also found to have left leg DVT, provoked in setting of surgery.  Echo 5/24 EF 25-30%, LV with RWMA with akinesis of the apex.  Mod concentric LVH. GIDD, large apical thrombus. RV mildly reduced, trivial MR. Started on Xarelto. Plan for 15mg  BID for 21 days, then 20mg  daily thereafter for 3-6 months (for LV thrombus treatment - patient had recurrent LV thrombus) then will need determination of dose reduction contingent on renal function thereafter.  Discharged on Jardiance, metoprolol and PRN torsemide. Entresto and spironolactone on hold, discharge weight 202lbs.    Today he returns for post hospital follow up with his wife. Overall feeling pretty good. Denies palpitations, CP, dizziness, edema, or PND/Orthopnea. No SOB. Appetite pretty good. No fever or chills. Weight at home 20-205 pounds. Taking all medications. Denies ETOH, smoking. Has not taken any torsemide recently.    Cardiac Studies: - Echo 5/24 EF 25-30%, LV with RWMA with akinesis of the apex.  Mod concentric LVH. GIDD, large apical thrombus.  RV mildly reduced, trivial MR. - cMRI (11/22): EF 21% and minimal viability in RCA and LAD territories - [facility] 07/27/21:   Ost RCA to Prox RCA lesion is 100% stenosed.   Prox Cx to Mid Cx lesion is 30% stenosed.   1st Mrg lesion is 20% stenosed.   Prox LAD lesion is 60% stenosed.   Mid LAD lesion is 90% stenosed.   Mid LAD to Dist LAD lesion is 100% stenosed. Findings: Ao = 143/88 (109)  LV = 146/25 RA =  8 RV = 41/12 PA = 45/20 (29) PCW = 21 Fick cardiac output/index = 3.4/1.7 PVR = 2.4 WU SVR = 2383  FA sat = 95% PA sat = 54%, 57% PAPi 3.125   Past Medical History:  Diagnosis Date   Ascending aortic aneurysm (HCC)    Bilateral renal masses    CAD (coronary artery disease)    CHF (congestive heart failure) (HCC)    HLD (hyperlipidemia)    Hypertension    LV (left ventricular) mural thrombus    Systolic heart failure (HCC)    Current Outpatient Medications  Medication Sig Dispense Refill   atorvastatin (LIPITOR) 80 MG tablet Take 1 tablet (80 mg total) by mouth daily. 30 tablet 1   JARDIANCE 10 MG TABS tablet TAKE 1 TABLET BY MOUTH EVERY DAY 30 tablet 11   metoprolol succinate (TOPROL XL) 50 MG 24 hr tablet Take 1 tablet (50 mg total) by mouth at bedtime. 30 tablet 11   Multiple Vitamin (MULTIVITAMIN WITH MINERALS) TABS tablet Take 1 tablet by mouth daily.     RIVAROXABAN (XARELTO) VTE STARTER PACK (15 & 20 MG) Follow package directions: Take one 15mg  tablet by mouth twice a day. On day 22, switch to one 20mg  tablet once a day.  Take with food. 51 each 0   torsemide (DEMADEX) 20 MG tablet Take 1 tablet (20 mg total) by mouth daily as needed (swelling or weight gain). 30 tablet 6   traMADol (ULTRAM) 50 MG tablet Take 1-2 tablets (50-100 mg total) by mouth every 6 (six) hours as needed for moderate pain. 15 tablet 0   [START ON 02/15/2023] rivaroxaban (XARELTO) 20 MG TABS tablet Take 1 tablet (20 mg total) by mouth daily with supper. (Start this prescription after your finish the xarelto starter pack.  Follow up with cardiology for refills after this. (Patient not taking: Reported on 01/28/2023) 30 tablet 1   No current facility-administered medications for this encounter.   No Known Allergies  Social History   Socioeconomic History   Marital status: Married    Spouse name: Not on file   Number of children: 2   Years of education: 12   Highest education level: High school graduate  Occupational History   Occupation: Retired  Tobacco Use   Smoking status: Former    Types: Cigarettes    Quit date: 07/27/2021    Years since quitting: 1.5   Smokeless tobacco: Never  Vaping Use   Vaping Use: Never used  Substance and Sexual Activity   Alcohol use: Not Currently   Drug use: Never   Sexual activity: Not on file  Other Topics Concern   Not on file  Social History Narrative   Not on file   Social Determinants of Health   Financial Resource Strain: Low Risk  (09/03/2022)   Overall Financial Resource Strain (CARDIA)    Difficulty of Paying Living Expenses: Not hard at all  Food Insecurity: No Food Insecurity (01/07/2023)   Hunger Vital Sign    Worried About Running Out of Food in the Last Year: Never true    Ran Out of Food in the Last Year: Never true  Transportation Needs: No Transportation Needs (01/07/2023)   PRAPARE - Administrator, Civil Service (Medical): No    Lack of Transportation (Non-Medical): No  Physical Activity: Sufficiently Active (09/03/2022)   Exercise Vital Sign    Days of  Exercise per Week: 5 days    Minutes of Exercise per Session: 30 min  Stress: No Stress Concern Present (09/03/2022)   [employer] of Occupational Health - Occupational Stress Questionnaire    Feeling of Stress : Not at all  Social Connections: Socially Integrated (09/03/2022)   Social Connection and Isolation Panel [NHANES]    Frequency of Communication with Friends and Family: More than three times a week    Frequency of Social Gatherings with Friends and Family: More than three times a week    Attends Religious Services: More than 4 times per year    Active Member of [employer] or Organizations: Yes  Attends Banker Meetings: More than 4 times per year    Marital Status: Married  Catering manager Violence: Not At Risk (01/07/2023)   Humiliation, Afraid, Rape, and Kick questionnaire    Fear of Current or Ex-Partner: No    Emotionally Abused: No    Physically Abused: No    Sexually Abused: No   Family History  Problem Relation Age of Onset   Hypertension Brother    Heart failure Neg Hx    BP (!) 160/88   Pulse 65   Wt 92.7 kg (204 lb 6.4 oz)   SpO2 96%   BMI 29.33 kg/m   Wt Readings from Last 3 Encounters:  01/28/23 92.7 kg (204 lb 6.4 oz)  01/27/23 93.1 kg (205 lb 4 oz)  01/18/23 91.9 kg (202 lb 9.6 oz)   PHYSICAL EXAM: General:  well appearing.  No respiratory difficulty.  HEENT: normal Neck: supple. JVD ~7 cm. Carotids 2+ bilat; no bruits. No lymphadenopathy or thyromegaly appreciated. Cor: PMI nondisplaced. Regular rate & rhythm. No rubs, gallops or murmurs. Lungs: clear Abdomen: soft, nontender, nondistended. No hepatosplenomegaly. No bruits or masses. Good bowel sounds. Extremities: no cyanosis, clubbing, rash, trace BLE edema  Neuro: alert & oriented x 3, cranial nerves grossly intact. moves all 4 extremities w/o difficulty. Affect pleasant.   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 - Echo (10/30/21): EF 25-30% + apical clot. Moderate MR/TR. - Echo 11/23 EF 25-20% RV ok - Echo 5/24 EF 25-30%, LV with RWMA with akinesis of the apex.  Mod concentric LVH. GIDD, large apical thrombus.  RV mildly reduced, trivial MR. - NYHA I volume stable, weight stable - Restart Entresto 24-26 mg bid. - Restart spiro 12.5 mg daily.  - Continue torsemide 20 mg PRN. Has not needed recently.  - Continue Jardiance 10 mg daily.  - Continue Toprol XL 50 mg q hs. - Has seen EP. Declines ICD. - Order compression socks - Labs at PCP yesterday K 4, SCr 1.45. No labs today. BMET 10 days  2. CAD - Cath 11/22 severe 2V CAD, mid-distal LAD 100% stenosed, ost-prox RCA 100% stenosed  - cMRI 11/22  EF 21% and larg  inferior and apical scars with minimal viability in the RCA/LAD territories. Large LV clot.  - No options for revascularization, continue medical therapy. - No s/s angina - Continue  atorvastatin 80 + ? blocker. - On Xarelto, denies bleeding. CBC yesterday Hgb stable 10.5.    3. LV Thrombus  - 2/2 severe LV dysfunction, seen again on echo 5/24 - With recurrent LV thrombus now on Xarelto 15mg  BID for 21 days, then 20mg  daily thereafter for 3-6 months. Will need to be on A/C indefinitely   4. Bilateral Renal Masses, suspicion for RCC - s/p resection 01/07/23   5. Hypertension  - BP elevated - GDMT as above.  6. Ascending Thoracic Aortic Aneurysm - 43 mm in diameter on chest CT - Asc Ao 4.0 cm on echo 03/23 stable at 4.1 on echo 11/23 - Asc Ao 3.8 on 5/24 echo - Continue to follow  7. CKD 3a - continue SGLT2i - BMET stable yesterday  8. PE/DVT - on Xarelto as  above - provoked DVT post surgery  Follow up 2-3 weeks with PharmD to titrate Entresto/Spiro.  2-3 months with Dr. Glenice Laine, NP  11:39 AM

## 2023-01-27 NOTE — Progress Notes (Signed)
Troy May 72 y.o.   Chief Complaint  Patient presents with   Hospitalization Follow-up    Patient had surgery, patient was d/c with home oxygen , currently still on oxygen at night.   Patient needs a portable oxygen unit for home/ home health order     HISTORY OF PRESENT ILLNESS: This is a 72 y.o. male here for hospital discharge follow-up Admitted on 01/07/2023 for renal mass surgery.  Postop complicated with VTE and pulmonary embolism Also developed congestive heart failure.  Scheduled to see cardiologist tomorrow morning. Presently on Xarelto. Accompanied by wife today.  Doing much better. Hospital discharge summary as follows: Physician Discharge Summary  Troy May BMW:[id number] DOB: [birth year] DOA: 01/07/2023   PCP: Georgina Quint, MD   Admit date: 01/07/2023 Discharge date: 01/18/2023   Time spent: 40 minutes   Recommendations for Outpatient Follow-up:  Follow outpatient CBC/CMP  Xarelto, needs 3-6 months at 20 mg daily dose -> then needs indefinite anticoagulation with recurrent LV thrombus (consider dose adjustment based on renal function after initial 3-6 month treatment for VTE) Follow with cardiology for adjustment of GDMT - entresto and spironolactone currently on hold.  Torsemide is prn med Follow renal function Follow with urology outpatient   Follow long term O2 needs, wean as able   Discharge Diagnoses:  Principal Problem:   Renal cell carcinoma (HCC) Active Problems:   Essential hypertension   AKI (acute kidney injury) (HCC)   Acute combined systolic and diastolic heart failure (HCC)   Left ventricular apical thrombus   Coronary artery disease of native artery of native heart with stable angina pectoris (HCC)   Dyslipidemia   Renal mass   Type 2 diabetes mellitus (HCC)   Biventricular heart failure (HCC)   Paroxysmal atrial fibrillation (HCC)   Acute on chronic combined systolic and diastolic CHF (congestive heart failure) (HCC)   Acute  pulmonary embolism (HCC)    HPI   Prior to Admission medications   Medication Sig Start Date End Date Taking? Authorizing Provider  atorvastatin (LIPITOR) 80 MG tablet Take 1 tablet (80 mg total) by mouth daily. 01/18/23 02/17/23 Yes Zigmund Daniel., MD  JARDIANCE 10 MG TABS tablet TAKE 1 TABLET BY MOUTH EVERY DAY 01/20/23  Yes Andrey Farmer, PA-C  metoprolol succinate (TOPROL XL) 50 MG 24 hr tablet Take 1 tablet (50 mg total) by mouth at bedtime. 04/15/22  Yes Bensimhon, Bevelyn Buckles, MD  rivaroxaban (XARELTO) 20 MG TABS tablet Take 1 tablet (20 mg total) by mouth daily with supper. (Start this prescription after your finish the xarelto starter pack.  Follow up with cardiology for refills after this. 02/15/23 04/16/23 Yes Zigmund Daniel., MD  RIVAROXABAN Carlena Hurl) VTE STARTER PACK (15 & 20 MG) Follow package directions: Take one 15mg  tablet by mouth twice a day. On day 22, switch to one 20mg  tablet once a day. Take with food. 01/18/23  Yes Zigmund Daniel., MD  Multiple Vitamin (MULTIVITAMIN WITH MINERALS) TABS tablet Take 1 tablet by mouth daily.    [provider]  torsemide (DEMADEX) 20 MG tablet Take 1 tablet (20 mg total) by mouth daily as needed (swelling or weight gain). Patient not taking: Reported on 01/27/2023 01/18/23   Zigmund Daniel., MD  traMADol (ULTRAM) 50 MG tablet Take 1-2 tablets (50-100 mg total) by mouth every 6 (six) hours as needed for moderate pain. Patient not taking: Reported on 01/27/2023 01/07/23   Crist Fat, MD  No Known Allergies  Patient Active Problem List   Diagnosis Date Noted   Renal cell carcinoma (HCC) 01/14/2023   Acute pulmonary embolism (HCC) 01/14/2023   Type 2 diabetes mellitus (HCC) 01/12/2023   Biventricular heart failure (HCC) 01/12/2023   Paroxysmal atrial fibrillation (HCC) 01/12/2023   Acute on chronic combined systolic and diastolic CHF (congestive heart failure) (HCC) 01/12/2023   Renal mass  01/07/2023   Dyslipidemia 08/31/2022   Coronary artery disease of native artery of native heart with stable angina pectoris (HCC) 02/08/2022   Chronic systolic heart failure (HCC) 08/05/2021   NSTEMI (non-ST elevated myocardial infarction) (HCC) 07/25/2021   Essential hypertension 07/25/2021   AKI (acute kidney injury) (HCC) 07/25/2021   Acute combined systolic and diastolic heart failure (HCC)    Left ventricular apical thrombus     Past Medical History:  Diagnosis Date   Ascending aortic aneurysm (HCC)    Bilateral renal masses    CAD (coronary artery disease)    CHF (congestive heart failure) (HCC)    HLD (hyperlipidemia)    Hypertension    LV (left ventricular) mural thrombus    Systolic heart failure (HCC)     Past Surgical History:  Procedure Laterality Date   CARDIAC CATHETERIZATION     RIGHT/LEFT HEART CATH AND CORONARY ANGIOGRAPHY N/A 07/27/2021   Procedure: RIGHT/LEFT HEART CATH AND CORONARY ANGIOGRAPHY;  Surgeon: Dolores Patty, MD;  Location: [facility];  Service: Cardiovascular;  Laterality: N/A;   ROBOTIC ASSITED PARTIAL NEPHRECTOMY Left 01/07/2023   Procedure: XI ROBOTIC ASSITED LEFT PARTIAL NEPHRECTOMY;  Surgeon: Crist Fat, MD;  Location: [facility];  Service: Urology;  Laterality: Left;  210 MINUTES    Social History   Socioeconomic History   Marital status: Married    Spouse name: Not on file   Number of children: 2   Years of education: 12   Highest education level: High school graduate  Occupational History   Occupation: Retired  Tobacco Use   Smoking status: Former    Types: Cigarettes    Quit date: 07/27/2021    Years since quitting: 1.5   Smokeless tobacco: Never  Vaping Use   Vaping Use: Never used  Substance and Sexual Activity   Alcohol use: Not Currently   Drug use: Never   Sexual activity: Not on file  Other Topics Concern   Not on file  Social History Narrative   Not on file   Social Determinants of Health    Financial Resource Strain: Low Risk  (09/03/2022)   Overall Financial Resource Strain (CARDIA)    Difficulty of Paying Living Expenses: Not hard at all  Food Insecurity: No Food Insecurity (01/07/2023)   Hunger Vital Sign    Worried About Running Out of Food in the Last Year: Never true    Ran Out of Food in the Last Year: Never true  Transportation Needs: No Transportation Needs (01/07/2023)   PRAPARE - Administrator, Civil Service (Medical): No    Lack of Transportation (Non-Medical): No  Physical Activity: Sufficiently Active (09/03/2022)   Exercise Vital Sign    Days of Exercise per Week: 5 days    Minutes of Exercise per Session: 30 min  Stress: No Stress Concern Present (09/03/2022)   [employer] of Occupational Health - Occupational Stress Questionnaire    Feeling of Stress : Not at all  Social Connections: Socially Integrated (09/03/2022)   Social Connection and Isolation Panel [NHANES]    Frequency of  Communication with Friends and Family: More than three times a week    Frequency of Social Gatherings with Friends and Family: More than three times a week    Attends Religious Services: More than 4 times per year    Active Member of [employer] or Organizations: Yes    Attends Engineer, structural: More than 4 times per year    Marital Status: Married  Catering manager Violence: Not At Risk (01/07/2023)   Humiliation, Afraid, Rape, and Kick questionnaire    Fear of Current or Ex-Partner: No    Emotionally Abused: No    Physically Abused: No    Sexually Abused: No    Family History  Problem Relation Age of Onset   Hypertension Brother    Heart failure Neg Hx      Review of Systems  Constitutional: Negative.  Negative for chills and fever.  HENT: Negative.  Negative for congestion and sore throat.   Respiratory: Negative.  Negative for cough and shortness of breath.   Cardiovascular: Negative.  Negative for chest pain and palpitations.   Gastrointestinal:  Negative for abdominal pain, diarrhea, nausea and vomiting.  Genitourinary: Negative.  Negative for dysuria and hematuria.  Skin: Negative.  Negative for rash.  Neurological: Negative.  Negative for dizziness and headaches.  All other systems reviewed and are negative.   Vitals:   01/27/23 1519  BP: (!) 150/88  Pulse: (!) 59  Temp: 98.5 F (36.9 C)  SpO2: 95%    Physical Exam Vitals reviewed.  Constitutional:      Appearance: Normal appearance.  HENT:     Head: Normocephalic.     Mouth/Throat:     Mouth: Mucous membranes are moist.     Pharynx: Oropharynx is clear.  Eyes:     Extraocular Movements: Extraocular movements intact.     Conjunctiva/sclera: Conjunctivae normal.     Pupils: Pupils are equal, round, and reactive to light.  Cardiovascular:     Rate and Rhythm: Normal rate and regular rhythm.     Pulses: Normal pulses.     Heart sounds: Normal heart sounds.  Pulmonary:     Effort: Pulmonary effort is normal.     Breath sounds: Normal breath sounds.  Abdominal:     Palpations: Abdomen is soft.     Tenderness: There is no abdominal tenderness.     Comments: Surgical Scars healing well  Musculoskeletal:     Cervical back: No tenderness.     Right lower leg: No edema.     Left lower leg: No edema.  Lymphadenopathy:     Cervical: No cervical adenopathy.  Skin:    General: Skin is warm and dry.     Capillary Refill: Capillary refill takes less than 2 seconds.  Neurological:     General: No focal deficit present.     Mental Status: He is alert and oriented to person, place, and time.  Psychiatric:        Mood and Affect: Mood normal.        Behavior: Behavior normal.      ASSESSMENT & PLAN: A total of 48 minutes was spent with the patient and counseling/coordination of care regarding preparing for this visit, review of most recent office visit notes, review of most recent hospital discharge summary, review of multiple chronic medical  conditions and their management, review of all medications, review of most recent blood work results, fall precautions, prognosis, documentation, and need for follow-up.  Problem List Items Addressed This  Visit       Cardiovascular and Mediastinum   Essential hypertension    BP Readings from Last 3 Encounters:  01/27/23 (!) 150/88  01/18/23 119/66  12/31/22 138/82  Continue metoprolol succinate 50 mg daily Entresto and Aldactone were held after discharge Cardiology appointment tomorrow       Relevant Orders   Comprehensive metabolic panel   CBC with Differential/Platelet   Chronic systolic heart failure (HCC)    No signs of acute congestive heart failure today Clinically euvolemic Presently not taking Entresto, Aldactone or torsemide Has cardiology appointment for tomorrow morning Wt Readings from Last 3 Encounters:  01/27/23 205 lb 4 oz (93.1 kg)  01/18/23 202 lb 9.6 oz (91.9 kg)  12/31/22 202 lb 13.2 oz (92 kg)        Relevant Orders   Comprehensive metabolic panel   CBC with Differential/Platelet   Acute pulmonary embolism (HCC) - Primary    Clinically stable and oxygenating well No respiratory distress.  O2 sat 95 No dyspnea on exertion.  No more need for oxygen supplementation Continues daily Xarelto 20 mg No clinical signs of bleeding Fall precautions given        Other   Renal mass    Pathology report negative for cancer      Other Visit Diagnoses     Hospital discharge follow-up          Patient Instructions  Pulmonary Embolism  A pulmonary embolism (PE) is a sudden blockage or decrease of blood flow in one or both lungs that happens when a clot travels into the arteries of the lung (pulmonary arteries). Most blockages come from a blood clot that forms in the vein of a leg or arm (deep vein thrombosis, DVT) and travels to the lungs. A clot is blood that has thickened into a gel or solid. PE is a dangerous and life-threatening condition that needs to  be treated right away. What are the causes? This condition is usually caused by a blood clot that forms in a vein and moves to the lungs. In rare cases, it may be caused by air, fat, part of a tumor, or other tissue that moves through the veins and into the lungs. What increases the risk? The following factors may make you more likely to develop this condition: Experiencing a traumatic injury, such as breaking a hip or leg. Having: A spinal cord injury. Major surgery, especially hip or knee replacement, or surgery on parts of the nervous system or on the abdomen. A stroke. A blood-clotting disease. Long-term (chronic) lung or heart disease. Cancer, especially if you are being treated with chemotherapy. A central venous catheter. Taking medicines that contain estrogen. These include birth control pills and hormone replacement therapy. Being: Pregnant. In the period of time after your baby is delivered (postpartum). Older than age 25. Overweight. A smoker, especially if you have other risks. Not very active (sedentary), not being able to move at all, or spending long periods sitting, such as travel over 6 hours. You are also at a greater risk if you have a leg in a cast or splint. What are the signs or symptoms? Symptoms of this condition usually start suddenly and include: Shortness of breath during activity or at rest. Coughing, coughing up blood, or coughing up bloody mucus. Chest pain, back pain, or shoulder blade pain that gets worse with deep breaths. Rapid or irregular heartbeat. Feeling light-headed or dizzy, or fainting. Feeling anxious. Pain and swelling in a leg. This  is a symptom of DVT, which can lead to PE. How is this diagnosed? This condition may be diagnosed based on your medical history, a physical exam, and tests. Tests may include: Blood tests. An ECG (electrocardiogram) of the heart. A CT pulmonary angiogram. This test checks blood flow in and around your  lungs. A ventilation-perfusion scan, also called a lung VQ scan. This test measures air flow and blood flow to the lungs. An ultrasound to check for a DVT. How is this treated? Treatment for this condition depends on many factors, such as the cause of your PE, your risk for bleeding or developing more clots, and other medical conditions you may have. Treatment aims to stop blood clots from forming or growing larger. In some cases, treatment may be aimed at breaking apart or removing the blood clot. Treatment may include: Medicines, such as: Blood thinning medicines, also called anticoagulants, to stop clots from forming and growing. Medicines that break apart clots (fibrinolytics). Procedures, such as: Using a flexible tube to remove a blood clot (embolectomy) or to deliver medicine to destroy it (catheter-directed thrombolysis). Surgery to remove the clot (surgical embolectomy). This is rare. You may need a combination of immediate, long-term, and extended treatments. Your treatment may continue for several months (maintenance therapy) or longer depending on your medical conditions. You and your health care provider will work together to choose the treatment program that is best for you. Follow these instructions at home: Medicines Take over-the-counter and prescription medicines only as told by your health care provider. If you are taking blood thinners: Talk with your health care provider before you take any medicines that contain aspirin or NSAIDs, such as ibuprofen. These medicines increase your risk for dangerous bleeding. Take your medicine exactly as told, at the same time every day. Avoid activities that could cause injury or bruising, and follow instructions about how to prevent falls. Wear a medical alert bracelet or carry a card that lists what medicines you take. Understand what foods and drugs interact with any medicines that you are taking. General instructions Ask your health  care provider when you may return to your normal activities. Avoid sitting or lying for a long time without moving. Maintain a healthy weight. Ask your health care provider what weight is healthy for you. Do not use any products that contain nicotine or tobacco. These products include cigarettes, chewing tobacco, and vaping devices, such as e-cigarettes. If you need help quitting, ask your health care provider. Talk with your health care provider about any travel plans. It is important to make sure that you are still able to take your medicine while traveling. Keep all follow-up visits. This is important. Where to find more information American Lung Association: www.lung.org Centers for Disease Control and Prevention: FootballExhibition.com.br Contact a health care provider if: You missed a dose of your blood thinner medicine. You have a fever. Get help right away if: You have: New or increased pain, swelling, warmth, or redness in an arm or leg. Shortness of breath that gets worse during activity or at rest. Worsening chest pain. A rapid or irregular heartbeat. A severe headache. Vision changes. A serious fall or accident, or you hit your head. Blood in your vomit, stool, or urine. A cut that will not stop bleeding. You cough up blood. You feel light-headed or dizzy, and that feeling does not go away. You cannot move your arms or legs. You are confused or have memory loss. These symptoms may represent a serious problem  that is an emergency. Do not wait to see if the symptoms will go away. Get medical help right away. Call your local emergency services (911 in the [place] Do not drive yourself to the hospital. Summary A pulmonary embolism (PE) is a serious and potentially life-threatening condition. It happens when a blood clot from one part of the body travels to the arteries of the lung, causing a sudden blockage or decrease of blood flow to the lungs. This may result in shortness of breath, chest  pain, dizziness, and fainting. Treatments for this condition usually include medicines to thin your blood (anticoagulants) or medicines to break apart blood clots. If you are given blood thinners, take your medicine exactly as told by your health care provider, at the same time every day. This is important. Understand what foods and drugs interact with any medicines that you are taking. If you have signs of PE or DVT, call your local emergency services (911 in the [place] This information is not intended to replace advice given to you by your health care provider. Make sure you discuss any questions you have with your health care provider. Document Revised: 07/18/2020 Document Reviewed: 07/18/2020 Elsevier Patient Education  2024 Elsevier Inc.    Edwina Barth, MD [facility]

## 2023-01-27 NOTE — Assessment & Plan Note (Addendum)
Clinically stable and oxygenating well No respiratory distress.  O2 sat 95 No dyspnea on exertion.  No more need for oxygen supplementation Continues daily Xarelto 20 mg No clinical signs of bleeding Fall precautions given

## 2023-01-27 NOTE — Assessment & Plan Note (Signed)
No signs of acute congestive heart failure today Clinically euvolemic Presently not taking Entresto, Aldactone or torsemide Has cardiology appointment for tomorrow morning Wt Readings from Last 3 Encounters:  01/27/23 205 lb 4 oz (93.1 kg)  01/18/23 202 lb 9.6 oz (91.9 kg)  12/31/22 202 lb 13.2 oz (92 kg)

## 2023-01-28 ENCOUNTER — Telehealth: Payer: Self-pay

## 2023-01-28 ENCOUNTER — Telehealth (HOSPITAL_COMMUNITY): Payer: Self-pay | Admitting: Pharmacy Technician

## 2023-01-28 ENCOUNTER — Ambulatory Visit (HOSPITAL_COMMUNITY)
Admit: 2023-01-28 | Discharge: 2023-01-28 | Disposition: A | Discharge status: Home / Self Care | Payer: Medicare Other | Attending: Internal Medicine | Admitting: Internal Medicine

## 2023-01-28 ENCOUNTER — Encounter (HOSPITAL_COMMUNITY): Payer: Self-pay

## 2023-01-28 VITALS — BP 160/88 | HR 65 | Wt 204.4 lb

## 2023-01-28 DIAGNOSIS — N2889 Other specified disorders of kidney and ureter: Secondary | ICD-10-CM

## 2023-01-28 DIAGNOSIS — Z7984 Long term (current) use of oral hypoglycemic drugs: Secondary | ICD-10-CM | POA: Insufficient documentation

## 2023-01-28 DIAGNOSIS — E785 Hyperlipidemia, unspecified: Secondary | ICD-10-CM | POA: Diagnosis not present

## 2023-01-28 DIAGNOSIS — I7121 Aneurysm of the ascending aorta, without rupture: Secondary | ICD-10-CM | POA: Diagnosis not present

## 2023-01-28 DIAGNOSIS — Z79899 Other long term (current) drug therapy: Secondary | ICD-10-CM | POA: Insufficient documentation

## 2023-01-28 DIAGNOSIS — I2699 Other pulmonary embolism without acute cor pulmonale: Secondary | ICD-10-CM

## 2023-01-28 DIAGNOSIS — I10 Essential (primary) hypertension: Secondary | ICD-10-CM

## 2023-01-28 DIAGNOSIS — I5082 Biventricular heart failure: Secondary | ICD-10-CM | POA: Diagnosis not present

## 2023-01-28 DIAGNOSIS — N1831 Chronic kidney disease, stage 3a: Secondary | ICD-10-CM | POA: Insufficient documentation

## 2023-01-28 DIAGNOSIS — I513 Intracardiac thrombosis, not elsewhere classified: Secondary | ICD-10-CM

## 2023-01-28 DIAGNOSIS — Z7901 Long term (current) use of anticoagulants: Secondary | ICD-10-CM | POA: Insufficient documentation

## 2023-01-28 DIAGNOSIS — I714 Abdominal aortic aneurysm, without rupture, unspecified: Secondary | ICD-10-CM

## 2023-01-28 DIAGNOSIS — I2510 Atherosclerotic heart disease of native coronary artery without angina pectoris: Secondary | ICD-10-CM | POA: Diagnosis not present

## 2023-01-28 DIAGNOSIS — I130 Hypertensive heart and chronic kidney disease with heart failure and stage 1 through stage 4 chronic kidney disease, or unspecified chronic kidney disease: Secondary | ICD-10-CM | POA: Insufficient documentation

## 2023-01-28 MED ORDER — ENTRESTO 24-26 MG PO TABS: 1.0000 | ORAL_TABLET | Freq: Two times a day (BID) | ORAL | 11 refills | Status: DC

## 2023-01-28 MED ORDER — SPIRONOLACTONE 25 MG PO TABS: 12.5000 mg | ORAL_TABLET | Freq: Every day | ORAL | 11 refills | Status: DC

## 2023-01-28 NOTE — Patient Instructions (Addendum)
Thank you for coming in today  If you had labs drawn today, any labs that are abnormal the clinic will call you No news is good news  You have been given a prescription for compression hose. And a list of places who supply them. Please wear your compression hose daily, place them on as soon as you get up in the morning and remove before you go to bed at night.   Medications: RESTART Entresto 24/26 mg 1 tablet twice daily RESTART Spironolactone 12.5 mg 1/2 tablet daily   Follow up appointments: Your physician recommends that you return for lab work in: 1 week BMET  Your physician recommends that you schedule a follow-up appointment in:  2-3 weeks Pharmacy 3 months With Dr. Gala Romney     Do the following things EVERYDAY: Weigh yourself in the morning before breakfast. Write it down and keep it in a log. Take your medicines as prescribed Eat low salt foods--Limit salt (sodium) to 2000 mg per day.  Stay as active as you can everyday Limit all fluids for the day to less than 2 liters   At the [facility], you and your health needs are our priority. As part of our continuing mission to provide you with exceptional heart care, we have created designated Provider Care Teams. These Care Teams include your primary Cardiologist (physician) and Advanced Practice Providers (APPs- Physician Assistants and Nurse Practitioners) who all work together to provide you with the care you need, when you need it.   You may see any of the following providers on your designated Care Team at your next follow up: Dr Arvilla Meres Dr Marca Ancona Dr. Marcos Eke, NP Robbie Lis, [place] [facility] [place], [place] Brynda Peon, NP Karle Plumber, PharmD   Please be sure to bring in all your medications bottles to every appointment.    Thank you for choosing [facility]  If you have any questions or concerns before  your next appointment please send [place] a message through [place] or call our office at [phone].    TO LEAVE A MESSAGE FOR THE NURSE SELECT OPTION 2, PLEASE LEAVE A MESSAGE INCLUDING: YOUR NAME DATE OF BIRTH CALL BACK NUMBER REASON FOR CALL**this is important as we prioritize the call backs  YOU WILL RECEIVE A CALL BACK THE SAME DAY AS LONG AS YOU CALL BEFORE 4:00 PM

## 2023-01-28 NOTE — Telephone Encounter (Signed)
Pts husband has called and asked that there be an order for pts O2 to be stopped. Pt was seen by Dr. Alvy Bimler has seen the pt on 01/27/2023 and informed pt he does nt need O2 any longer. Pts wife called the O2 supply place for them to pick up oxygen supplies and she was told an order to stop his O2 needs to be done before they can come retrieve supplies.  Please call pt wife Aram Beecham at [phone]

## 2023-01-28 NOTE — Telephone Encounter (Signed)
Called pt to get fax # and who the oxygen is from. She states [employer] # [phone]. Inform wife faxing order to pick up oxygen.Marland KitchenRaechel Chute

## 2023-01-28 NOTE — Telephone Encounter (Signed)
[facility] Patient Advocate Encounter  The patient was approved for a [employer] grant that will help cover the cost of Jardiance, Entresto, Spironolactone and Metoprolol. Total amount awarded, $10,000. Eligibility, 01/17/23 - 01/16/24.  ID [id number]  BIN [id number]  PCN [id number]  Group [id number]  Emailed copy of grant information to patient's wife.  Archer Asa, CPhT

## 2023-01-28 NOTE — Telephone Encounter (Signed)
I saw this patient yesterday.  He does not need oxygen anymore.  Okay to place an order to stop oxygen.  Thanks.

## 2023-02-04 ENCOUNTER — Other Ambulatory Visit (HOSPITAL_COMMUNITY): Payer: Self-pay

## 2023-02-04 ENCOUNTER — Ambulatory Visit (HOSPITAL_COMMUNITY)
Admission: RE | Admit: 2023-02-04 | Discharge: 2023-02-04 | Disposition: A | Discharge status: Home / Self Care | Payer: Medicare Other | Source: Ambulatory Visit | Attending: Cardiology | Admitting: Cardiology

## 2023-02-04 DIAGNOSIS — I5082 Biventricular heart failure: Secondary | ICD-10-CM | POA: Diagnosis present

## 2023-02-04 LAB — BASIC METABOLIC PANEL
Anion gap: 8 (ref 5–15)
BUN: 21 mg/dL (ref 8–23)
CO2: 23 mmol/L (ref 22–32)
Calcium: 8.8 mg/dL — ABNORMAL LOW (ref 8.9–10.3)
Chloride: 105 mmol/L (ref 98–111)
Creatinine, Ser: 1.5 mg/dL — ABNORMAL HIGH (ref 0.61–1.24)
GFR, Estimated: 49 mL/min — ABNORMAL LOW (ref >60)
Glucose, Bld: 112 mg/dL — ABNORMAL HIGH (ref 70–99)
Potassium: 4.4 mmol/L (ref 3.5–5.1)
Sodium: 136 mmol/L (ref 135–145)

## 2023-02-08 ENCOUNTER — Inpatient Hospital Stay: Payer: Medicare Other | Admitting: Emergency Medicine

## 2023-02-09 NOTE — Progress Notes (Signed)
Advanced Heart Failure Clinic Note   Primary Care: Edwina Barth, MD HF Cardiologist:  Dr. Gala Romney  HPI:  Troy May is a 72 y.o. male w/ HTN, HLD, CAD, and BiV CHF/iCM.   Admitted 06/2021 w/ CP, Influenza A+. Hypertensive on arrival requiring labetalol gtt, CT chest - PE but + for cardiomegaly, coronary CA2+, mild aneurysmal dilatation of the ascending thoracic aorta 43 mm; bilateral renal masses suspicious for RCC.   Echo 06/2021 EF 25-30% w/LV apical thrombus G2DD, RV moderately HK. R/LHC severe 2V CAD w/ occluded LAD & RCA,  elevated filling pressures w/ low output, CI 1.7. No good options for revasc -> med rx. cMRI LVEF 21%, LAD/RCA territories with scar and do not appear viable, large LV thrombus. Discharge weight 172 lbs.   Echo 10/2021: EF 25-30%, + apical thrombus, moderate MR/TR   Follow up 10/2021. metoprolol XL 25 mg added. Referred to EP for ICD. Saw Dr. Graciela Husbands for evaluation 12/11/21. Wanted to think about ICD.   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 Follows with Urology q 6 months for surveillance.   Follow up 01/2022, off all meds for a few weeks. GDMT restarted. Declined ICD.   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 Post op complicated by acute hypoxic respiratory failure. VQ scan revealed PE. Also found to have left leg DVT, provoked in setting of surgery.  Echo 12/2022 EF 25-30%, LV with RWMA with akinesis of the apex.  Mod concentric LVH. GIDD, large apical thrombus. RV mildly reduced, trivial MR. Started on Xarelto. Plan for 15 mg BID for 21 days, then 20mg  daily thereafter for 3-6 months (for LV thrombus treatment - patient had recurrent LV thrombus) then will need determination of dose reduction contingent on renal function thereafter.  Discharged on Jardiance, metoprolol and PRN torsemide. Entresto and  spironolactone on hold, discharge weight 202 lbs.    Returned to [facility] for post hospital follow up with his wife 01/28/23. Overall was feeling pretty good. Denied palpitations, CP, dizziness, edema, or PND/Orthopnea. No SOB. Appetite was pretty good. No fever or chills. Weight at home was 200-205 pounds. Reported taking all medications. Denied ETOH, smoking. Had not taken any torsemide recently.   Today he returns to HF clinic for pharmacist medication titration with his wife. At last visit with APP, Entresto 24/26 mg BID and spironolactone 12.5 mg daily were restarted. Overall he is feeling well today. No dizziness, lightheadedness, CP or palpitations. No SOB/DOE. Has been walking and cutting his yard. Weight at home has been stable at 198-200 lbs. Has not needed any PRN torsemide. No LEE, PND or orthopnea. Taking all medications as prescribed and tolerating all medications.    HF Medications: Metoprolol succinate 50 mg daily Entresto 97/103 mg BID Spironolactone 12.5 mg daily Jardiance 10 mg daily Torsemide 20 mg PRN  Has the patient been experiencing any side effects to the medications prescribed?  no  Does the patient have any problems obtaining medications due to transportation or finances?   [employer] Medicare. Has Orthoptist for [employer].   Understanding of regimen: good Understanding of indications: good Potential of compliance: good Patient understands to avoid NSAIDs. Patient understands to avoid decongestants.    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 - Echo (10/30/21): EF 25-30% + apical clot. Moderate MR/TR. - Echo 06/2022 EF 25-20% RV ok - Echo 12/2022 EF 25-30%, LV with RWMA with akinesis of the apex.  Mod concentric LVH. GIDD, large apical thrombus.  RV mildly reduced, trivial MR. - NYHA I, euvolemic on exam.  - Continue torsemide 20 mg PRN. Has not needed recently.  - Continue metoprolol XL 50 mg qhs. - Increase Entresto to 49/51 mg BID. Repeat BMET in 2-3 weeks.  - Continue spironolactone 12.5 mg daily.  - Continue Jardiance 10 mg daily.  - Has seen EP. Declines ICD.   2. CAD - Cath 06/2021 severe 2V CAD, mid-distal LAD 100% stenosed, ost-prox RCA 100% stenosed  - cMRI 06/2021  EF 21% and larg  inferior and apical scars with minimal viability in the RCA/LAD territories. Large LV clot.  - No options for revascularization, continue medical therapy. - No s/s angina - Continue  atorvastatin 80 + ? blocker. - On Xarelto, denies bleeding. Off aspirin with Xarelto.    3. LV Thrombus  - 2/2 severe LV dysfunction, seen again on echo 12/2022 - With recurrent LV thrombus now on Xarelto 15mg  BID for 21 days, then 20 mg daily thereafter. Will need to be on A/C indefinitely.    4. Bilateral Renal Masses, suspicion for RCC - s/p resection 01/07/23   5. Hypertension  - BP improved - Increase Entresto as above  6. Ascending Thoracic Aortic Aneurysm - 43 mm in diameter on chest CT - Asc Ao 4.0 cm on echo 03/23 stable at 4.1 on echo 06/2022 - Asc Ao 3.8 on 5/24 echo - Continue to follow   7. CKD 3a - continue SGLT2i   8. PE/DVT - on Xarelto as above - provoked DVT post surgery  Follow up 3 weeks with Pharmacy Clinic to increase Entresto if remains stable.    Karle Plumber, PharmD, BCPS, BCCP, CPP Heart Failure Clinic Pharmacist [phone]

## 2023-02-24 ENCOUNTER — Ambulatory Visit (HOSPITAL_COMMUNITY)
Admission: RE | Admit: 2023-02-24 | Discharge: 2023-02-24 | Disposition: A | Discharge status: Home / Self Care | Payer: Medicare Other | Source: Ambulatory Visit | Attending: Cardiology | Admitting: Cardiology

## 2023-02-24 DIAGNOSIS — I7121 Aneurysm of the ascending aorta, without rupture: Secondary | ICD-10-CM | POA: Insufficient documentation

## 2023-02-24 DIAGNOSIS — E785 Hyperlipidemia, unspecified: Secondary | ICD-10-CM | POA: Diagnosis not present

## 2023-02-24 DIAGNOSIS — I110 Hypertensive heart disease with heart failure: Secondary | ICD-10-CM | POA: Insufficient documentation

## 2023-02-24 DIAGNOSIS — I5082 Biventricular heart failure: Secondary | ICD-10-CM | POA: Diagnosis present

## 2023-02-24 DIAGNOSIS — I2510 Atherosclerotic heart disease of native coronary artery without angina pectoris: Secondary | ICD-10-CM | POA: Diagnosis not present

## 2023-02-24 MED ORDER — ATORVASTATIN CALCIUM 80 MG PO TABS
80.0000 mg | ORAL_TABLET | Freq: Every day | ORAL | 11 refills | Status: DC
Start: 2023-02-24 — End: 2024-01-27

## 2023-02-24 MED ORDER — ENTRESTO 49-51 MG PO TABS: 1.0000 | ORAL_TABLET | Freq: Two times a day (BID) | ORAL | 5 refills | Status: DC

## 2023-02-24 NOTE — Patient Instructions (Signed)
It was a pleasure seeing you today!  MEDICATIONS: -We are changing your medications today -Increase Entresto to 49/51 mg (1 tablet) twice daily. You may take 2 tablets of the 24/26 mg strength twice daily until you receive the new strength.  -Call if you have questions about your medications.   NEXT APPOINTMENT: Return to clinic in 3 weeks with Pharmacy Clinic  In general, to take care of your heart failure: -Limit your fluid intake to 2 Liters (half-gallon) per day.   -Limit your salt intake to ideally 2-3 grams (2000-3000 mg) per day. -Weigh yourself daily and record, and bring that "weight diary" to your next appointment.  (Weight gain of 2-3 pounds in 1 day typically means fluid weight.) -The medications for your heart are to help your heart and help you live longer.   -Please contact us before stopping any of your heart medications.  Call the clinic at [phone] with questions or to reschedule future appointments.  

## 2023-03-10 ENCOUNTER — Ambulatory Visit: Payer: Medicare Other | Admitting: Emergency Medicine

## 2023-03-10 ENCOUNTER — Encounter: Payer: Self-pay | Admitting: Emergency Medicine

## 2023-03-10 VITALS — BP 130/82 | HR 70 | Temp 98.4°F | Ht 70.0 in | Wt 202.1 lb

## 2023-03-10 DIAGNOSIS — N5201 Erectile dysfunction due to arterial insufficiency: Secondary | ICD-10-CM

## 2023-03-10 DIAGNOSIS — Z1211 Encounter for screening for malignant neoplasm of colon: Secondary | ICD-10-CM

## 2023-03-10 DIAGNOSIS — I10 Essential (primary) hypertension: Secondary | ICD-10-CM

## 2023-03-10 DIAGNOSIS — E1165 Type 2 diabetes mellitus with hyperglycemia: Secondary | ICD-10-CM

## 2023-03-10 DIAGNOSIS — E785 Hyperlipidemia, unspecified: Secondary | ICD-10-CM

## 2023-03-10 DIAGNOSIS — I480 Paroxysmal atrial fibrillation: Secondary | ICD-10-CM | POA: Diagnosis not present

## 2023-03-10 DIAGNOSIS — C649 Malignant neoplasm of unspecified kidney, except renal pelvis: Secondary | ICD-10-CM

## 2023-03-10 DIAGNOSIS — I25118 Atherosclerotic heart disease of native coronary artery with other forms of angina pectoris: Secondary | ICD-10-CM

## 2023-03-10 DIAGNOSIS — I5022 Chronic systolic (congestive) heart failure: Secondary | ICD-10-CM | POA: Diagnosis not present

## 2023-03-10 DIAGNOSIS — I513 Intracardiac thrombosis, not elsewhere classified: Secondary | ICD-10-CM

## 2023-03-10 MED ORDER — SILDENAFIL CITRATE 100 MG PO TABS
50.0000 mg | ORAL_TABLET | Freq: Every day | ORAL | 11 refills | Status: AC | PRN
Start: 2023-03-10 — End: ?

## 2023-03-10 NOTE — Assessment & Plan Note (Signed)
BP Readings from Last 3 Encounters:  03/10/23 130/82  02/24/23 122/70  01/28/23 (!) 160/88  Well-controlled hypertension Continue metoprolol succinate 50 mg, Entresto 49-51 twice a day and Aldactone 12.5 mg daily

## 2023-03-10 NOTE — Assessment & Plan Note (Signed)
Fit for having sexual activity Not on nitroglycerin Okay to take Viagra as needed

## 2023-03-10 NOTE — Progress Notes (Signed)
Troy May 72 y.o.   Chief Complaint  Patient presents with   Medical Management of Chronic Issues    f/u appt, no concerns     HISTORY OF PRESENT ILLNESS: This is a 72 y.o. male here for follow-up of multiple chronic medical conditions. Overall doing well. Complaining of erectile dysfunction No other complaints or medical concerns today   HPI   Prior to Admission medications   Medication Sig Start Date End Date Taking? Authorizing Provider  atorvastatin (LIPITOR) 80 MG tablet Take 1 tablet (80 mg total) by mouth daily. 02/24/23  Yes Bensimhon, Bevelyn Buckles, MD  JARDIANCE 10 MG TABS tablet TAKE 1 TABLET BY MOUTH EVERY DAY 01/20/23  Yes Andrey Farmer, PA-C  metoprolol succinate (TOPROL XL) 50 MG 24 hr tablet Take 1 tablet (50 mg total) by mouth at bedtime. 04/15/22   Bensimhon, Bevelyn Buckles, MD  Multiple Vitamin (MULTIVITAMIN WITH MINERALS) TABS tablet Take 1 tablet by mouth daily.    [provider]  rivaroxaban (XARELTO) 20 MG TABS tablet Take 1 tablet (20 mg total) by mouth daily with supper. (Start this prescription after your finish the xarelto starter pack.  Follow up with cardiology for refills after this. 02/15/23 04/16/23  Zigmund Daniel., MD  sacubitril-valsartan (ENTRESTO) 49-51 MG Take 1 tablet by mouth 2 (two) times daily. 02/24/23   Bensimhon, Bevelyn Buckles, MD  spironolactone (ALDACTONE) 25 MG tablet Take 0.5 tablets (12.5 mg total) by mouth daily. 01/28/23 04/28/23  Alen Bleacher, NP  torsemide (DEMADEX) 20 MG tablet Take 1 tablet (20 mg total) by mouth daily as needed (swelling or weight gain). 01/18/23   Zigmund Daniel., MD    No Known Allergies  Patient Active Problem List   Diagnosis Date Noted   Renal cell carcinoma (HCC) 01/14/2023   Acute pulmonary embolism (HCC) 01/14/2023   Type 2 diabetes mellitus (HCC) 01/12/2023   Biventricular heart failure (HCC) 01/12/2023   Paroxysmal atrial fibrillation (HCC) 01/12/2023   Acute on chronic combined  systolic and diastolic CHF (congestive heart failure) (HCC) 01/12/2023   Renal mass 01/07/2023   Dyslipidemia 08/31/2022   Coronary artery disease of native artery of native heart with stable angina pectoris (HCC) 02/08/2022   Chronic systolic heart failure (HCC) 08/05/2021   Essential hypertension 07/25/2021   Acute combined systolic and diastolic heart failure (HCC)    Left ventricular apical thrombus     Past Medical History:  Diagnosis Date   Ascending aortic aneurysm (HCC)    Bilateral renal masses    CAD (coronary artery disease)    CHF (congestive heart failure) (HCC)    HLD (hyperlipidemia)    Hypertension    LV (left ventricular) mural thrombus    Systolic heart failure (HCC)     Past Surgical History:  Procedure Laterality Date   CARDIAC CATHETERIZATION     RIGHT/LEFT HEART CATH AND CORONARY ANGIOGRAPHY N/A 07/27/2021   Procedure: RIGHT/LEFT HEART CATH AND CORONARY ANGIOGRAPHY;  Surgeon: Dolores Patty, MD;  Location: [facility];  Service: Cardiovascular;  Laterality: N/A;   ROBOTIC ASSITED PARTIAL NEPHRECTOMY Left 01/07/2023   Procedure: XI ROBOTIC ASSITED LEFT PARTIAL NEPHRECTOMY;  Surgeon: Crist Fat, MD;  Location: [facility];  Service: Urology;  Laterality: Left;  210 MINUTES    Social History   Socioeconomic History   Marital status: Married    Spouse name: Not on file   Number of children: 2   Years of education: 15  Highest education level: High school graduate  Occupational History   Occupation: Retired  Tobacco Use   Smoking status: Former    Current packs/day: 0.00    Types: Cigarettes    Quit date: 07/27/2021    Years since quitting: 1.6   Smokeless tobacco: Never  Vaping Use   Vaping status: Never Used  Substance and Sexual Activity   Alcohol use: Not Currently   Drug use: Never   Sexual activity: Not on file  Other Topics Concern   Not on file  Social History Narrative   Not on file   Social Determinants of Health    Financial Resource Strain: Low Risk  (09/03/2022)   Overall Financial Resource Strain (CARDIA)    Difficulty of Paying Living Expenses: Not hard at all  Food Insecurity: No Food Insecurity (01/07/2023)   Hunger Vital Sign    Worried About Running Out of Food in the Last Year: Never true    Ran Out of Food in the Last Year: Never true  Transportation Needs: No Transportation Needs (01/07/2023)   PRAPARE - Administrator, Civil Service (Medical): No    Lack of Transportation (Non-Medical): No  Physical Activity: Sufficiently Active (09/03/2022)   Exercise Vital Sign    Days of Exercise per Week: 5 days    Minutes of Exercise per Session: 30 min  Stress: No Stress Concern Present (09/03/2022)   [employer] of Occupational Health - Occupational Stress Questionnaire    Feeling of Stress : Not at all  Social Connections: Socially Integrated (09/03/2022)   Social Connection and Isolation Panel [NHANES]    Frequency of Communication with Friends and Family: More than three times a week    Frequency of Social Gatherings with Friends and Family: More than three times a week    Attends Religious Services: More than 4 times per year    Active Member of [employer] or Organizations: Yes    Attends Engineer, structural: More than 4 times per year    Marital Status: Married  Catering manager Violence: Not At Risk (01/07/2023)   Humiliation, Afraid, Rape, and Kick questionnaire    Fear of Current or Ex-Partner: No    Emotionally Abused: No    Physically Abused: No    Sexually Abused: No    Family History  Problem Relation Age of Onset   Hypertension Brother    Heart failure Neg Hx      Review of Systems  Constitutional: Negative.  Negative for chills and fever.  HENT: Negative.  Negative for congestion and sore throat.   Respiratory: Negative.  Negative for cough and shortness of breath.   Cardiovascular: Negative.  Negative for chest pain and palpitations.   Gastrointestinal:  Negative for nausea and vomiting.  Genitourinary: Negative.  Negative for dysuria and hematuria.       Erectile dysfunction  Skin: Negative.  Negative for rash.  Neurological: Negative.  Negative for dizziness and headaches.  All other systems reviewed and are negative.   Today's Vitals   03/10/23 1338  BP: 130/82  Pulse: 70  Temp: 98.4 F (36.9 C)  TempSrc: Oral  SpO2: 96%  Weight: 202 lb 2 oz (91.7 kg)  Height: 5\' 10"  (1.778 m)   Body mass index is 29 kg/m.   Physical Exam Vitals reviewed.  Constitutional:      Appearance: Normal appearance.  HENT:     Head: Normocephalic.     Mouth/Throat:     Mouth: Mucous membranes  are moist.     Pharynx: Oropharynx is clear.  Eyes:     Extraocular Movements: Extraocular movements intact.     Pupils: Pupils are equal, round, and reactive to light.  Cardiovascular:     Rate and Rhythm: Normal rate and regular rhythm.     Pulses: Normal pulses.     Heart sounds: Normal heart sounds.  Pulmonary:     Effort: Pulmonary effort is normal.     Breath sounds: Normal breath sounds.  Abdominal:     Palpations: Abdomen is soft.     Tenderness: There is no abdominal tenderness.  Musculoskeletal:     Right lower leg: No edema.     Left lower leg: No edema.  Skin:    General: Skin is warm and dry.  Neurological:     Mental Status: He is alert and oriented to person, place, and time.  Psychiatric:        Mood and Affect: Mood normal.        Behavior: Behavior normal.      ASSESSMENT & PLAN: A total of 45 minutes was spent with the patient and counseling/coordination of care regarding preparing for this visit, review of most recent office visit notes, review of multiple chronic medical conditions and their management, review of most recent blood work results, review of all medications, education on nutrition, prognosis, documentation, and need for follow-up.  Problem List Items Addressed This Visit        Cardiovascular and Mediastinum   Essential hypertension    BP Readings from Last 3 Encounters:  03/10/23 130/82  02/24/23 122/70  01/28/23 (!) 160/88  Well-controlled hypertension Continue metoprolol succinate 50 mg, Entresto 49-51 twice a day and Aldactone 12.5 mg daily       Relevant Medications   sildenafil (VIAGRA) 100 MG tablet   Left ventricular apical thrombus    Will continue Xarelto 20 mg daily indefinitely      Relevant Medications   sildenafil (VIAGRA) 100 MG tablet   Chronic systolic heart failure (HCC) - Primary    Clinically euvolemic. Stable and asymptomatic Continue Entresto 49-51 twice a day, Aldactone 12.5 mg daily, metoprolol succinate 50 mg daily, and Demadex 20 mg daily      Relevant Medications   sildenafil (VIAGRA) 100 MG tablet   Coronary artery disease of native artery of native heart with stable angina pectoris (HCC)    Stable.  No anginal episodes. Not using nitroglycerin.      Relevant Medications   sildenafil (VIAGRA) 100 MG tablet   Paroxysmal atrial fibrillation (HCC)    Presently in normal sinus rhythm.  Well-controlled rate Continues anticoagulation with daily Xarelto 20 mg Continues rate control with beta-blocker metoprolol succinate 50 mg daily Fall precautions discussed      Relevant Medications   sildenafil (VIAGRA) 100 MG tablet   Erectile dysfunction due to arterial insufficiency    Fit for having sexual activity Not on nitroglycerin Okay to take Viagra as needed      Relevant Medications   sildenafil (VIAGRA) 100 MG tablet     Endocrine   Type 2 diabetes mellitus (HCC)    Well-controlled.  Continue Jardiance 10 mg daily Lab Results  Component Value Date   HGBA1C 6.0 (H) 12/31/2022           Genitourinary   Renal cell carcinoma (HCC) (Chronic)    Stable.  No concerns.        Other   Dyslipidemia    Stable chronic  condition Continue atorvastatin 80 mg daily      Other Visit Diagnoses     Screening for  colon cancer       Relevant Orders   Cologuard        Patient Instructions  Health Maintenance After Age 23 After age 87, you are at a higher risk for certain long-term diseases and infections as well as injuries from falls. Falls are a major cause of broken bones and head injuries in people who are older than age 73. Getting regular preventive care can help to keep you healthy and well. Preventive care includes getting regular testing and making lifestyle changes as recommended by your health care provider. Talk with your health care provider about: Which screenings and tests you should have. A screening is a test that checks for a disease when you have no symptoms. A diet and exercise plan that is right for you. What should I know about screenings and tests to prevent falls? Screening and testing are the best ways to find a health problem early. Early diagnosis and treatment give you the best chance of managing medical conditions that are common after age 26. Certain conditions and lifestyle choices may make you more likely to have a fall. Your health care provider may recommend: Regular vision checks. Poor vision and conditions such as cataracts can make you more likely to have a fall. If you wear glasses, make sure to get your prescription updated if your vision changes. Medicine review. Work with your health care provider to regularly review all of the medicines you are taking, including over-the-counter medicines. Ask your health care provider about any side effects that may make you more likely to have a fall. Tell your health care provider if any medicines that you take make you feel dizzy or sleepy. Strength and balance checks. Your health care provider may recommend certain tests to check your strength and balance while standing, walking, or changing positions. Foot health exam. Foot pain and numbness, as well as not wearing proper footwear, can make you more likely to have a  fall. Screenings, including: Osteoporosis screening. Osteoporosis is a condition that causes the bones to get weaker and break more easily. Blood pressure screening. Blood pressure changes and medicines to control blood pressure can make you feel dizzy. Depression screening. You may be more likely to have a fall if you have a fear of falling, feel depressed, or feel unable to do activities that you used to do. Alcohol use screening. Using too much alcohol can affect your balance and may make you more likely to have a fall. Follow these instructions at home: Lifestyle Do not drink alcohol if: Your health care provider tells you not to drink. If you drink alcohol: Limit how much you have to: 0-1 drink a day for women. 0-2 drinks a day for men. Know how much alcohol is in your drink. In the [place] one drink equals one 12 oz bottle of beer (355 mL), one 5 oz glass of wine (148 mL), or one 1 oz glass of hard liquor (44 mL). Do not use any products that contain nicotine or tobacco. These products include cigarettes, chewing tobacco, and vaping devices, such as e-cigarettes. If you need help quitting, ask your health care provider. Activity  Follow a regular exercise program to stay fit. This will help you maintain your balance. Ask your health care provider what types of exercise are appropriate for you. If you need a cane or walker, use it  as recommended by your health care provider. Wear supportive shoes that have nonskid soles. Safety  Remove any tripping hazards, such as rugs, cords, and clutter. Install safety equipment such as grab bars in bathrooms and safety rails on stairs. Keep rooms and walkways well-lit. General instructions Talk with your health care provider about your risks for falling. Tell your health care provider if: You fall. Be sure to tell your health care provider about all falls, even ones that seem minor. You feel dizzy, tiredness (fatigue), or off-balance. Take  over-the-counter and prescription medicines only as told by your health care provider. These include supplements. Eat a healthy diet and maintain a healthy weight. A healthy diet includes low-fat dairy products, low-fat (lean) meats, and fiber from whole grains, beans, and lots of fruits and vegetables. Stay current with your vaccines. Schedule regular health, dental, and eye exams. Summary Having a healthy lifestyle and getting preventive care can help to protect your health and wellness after age 13. Screening and testing are the best way to find a health problem early and help you avoid having a fall. Early diagnosis and treatment give you the best chance for managing medical conditions that are more common for people who are older than age 46. Falls are a major cause of broken bones and head injuries in people who are older than age 34. Take precautions to prevent a fall at home. Work with your health care provider to learn what changes you can make to improve your health and wellness and to prevent falls. This information is not intended to replace advice given to you by your health care provider. Make sure you discuss any questions you have with your health care provider. Document Revised: 01/05/2021 Document Reviewed: 01/05/2021 Elsevier Patient Education  2024 Elsevier Inc.     Edwina Barth, MD [facility]

## 2023-03-10 NOTE — Assessment & Plan Note (Signed)
Well-controlled.  Continue Jardiance 10 mg daily Lab Results  Component Value Date   HGBA1C 6.0 (H) 12/31/2022

## 2023-03-10 NOTE — Assessment & Plan Note (Signed)
Stable chronic condition Continue atorvastatin 80 mg daily. 

## 2023-03-10 NOTE — Assessment & Plan Note (Signed)
Stable.  No concerns. °

## 2023-03-10 NOTE — Assessment & Plan Note (Signed)
Stable.  No anginal episodes. Not using nitroglycerin.

## 2023-03-10 NOTE — Assessment & Plan Note (Signed)
Will continue Xarelto 20 mg daily indefinitely

## 2023-03-10 NOTE — Assessment & Plan Note (Addendum)
Presently in normal sinus rhythm.  Well-controlled rate Continues anticoagulation with daily Xarelto 20 mg Continues rate control with beta-blocker metoprolol succinate 50 mg daily Fall precautions discussed

## 2023-03-10 NOTE — Patient Instructions (Signed)
Health Maintenance After Age 72 After age 72, you are at a higher risk for certain long-term diseases and infections as well as injuries from falls. Falls are a major cause of broken bones and head injuries in people who are older than age 72. Getting regular preventive care can help to keep you healthy and well. Preventive care includes getting regular testing and making lifestyle changes as recommended by your health care provider. Talk with your health care provider about: Which screenings and tests you should have. A screening is a test that checks for a disease when you have no symptoms. A diet and exercise plan that is right for you. What should I know about screenings and tests to prevent falls? Screening and testing are the best ways to find a health problem early. Early diagnosis and treatment give you the best chance of managing medical conditions that are common after age 72. Certain conditions and lifestyle choices may make you more likely to have a fall. Your health care provider may recommend: Regular vision checks. Poor vision and conditions such as cataracts can make you more likely to have a fall. If you wear glasses, make sure to get your prescription updated if your vision changes. Medicine review. Work with your health care provider to regularly review all of the medicines you are taking, including over-the-counter medicines. Ask your health care provider about any side effects that may make you more likely to have a fall. Tell your health care provider if any medicines that you take make you feel dizzy or sleepy. Strength and balance checks. Your health care provider may recommend certain tests to check your strength and balance while standing, walking, or changing positions. Foot health exam. Foot pain and numbness, as well as not wearing proper footwear, can make you more likely to have a fall. Screenings, including: Osteoporosis screening. Osteoporosis is a condition that causes  the bones to get weaker and break more easily. Blood pressure screening. Blood pressure changes and medicines to control blood pressure can make you feel dizzy. Depression screening. You may be more likely to have a fall if you have a fear of falling, feel depressed, or feel unable to do activities that you used to do. Alcohol use screening. Using too much alcohol can affect your balance and may make you more likely to have a fall. Follow these instructions at home: Lifestyle Do not drink alcohol if: Your health care provider tells you not to drink. If you drink alcohol: Limit how much you have to: 0-1 drink a day for women. 0-2 drinks a day for men. Know how much alcohol is in your drink. In the [place] one drink equals one 12 oz bottle of beer (355 mL), one 5 oz glass of wine (148 mL), or one 1 oz glass of hard liquor (44 mL). Do not use any products that contain nicotine or tobacco. These products include cigarettes, chewing tobacco, and vaping devices, such as e-cigarettes. If you need help quitting, ask your health care provider. Activity  Follow a regular exercise program to stay fit. This will help you maintain your balance. Ask your health care provider what types of exercise are appropriate for you. If you need a cane or walker, use it as recommended by your health care provider. Wear supportive shoes that have nonskid soles. Safety  Remove any tripping hazards, such as rugs, cords, and clutter. Install safety equipment such as grab bars in bathrooms and safety rails on stairs. Keep rooms and walkways   well-lit. General instructions Talk with your health care provider about your risks for falling. Tell your health care provider if: You fall. Be sure to tell your health care provider about all falls, even ones that seem minor. You feel dizzy, tiredness (fatigue), or off-balance. Take over-the-counter and prescription medicines only as told by your health care provider. These include  supplements. Eat a healthy diet and maintain a healthy weight. A healthy diet includes low-fat dairy products, low-fat (lean) meats, and fiber from whole grains, beans, and lots of fruits and vegetables. Stay current with your vaccines. Schedule regular health, dental, and eye exams. Summary Having a healthy lifestyle and getting preventive care can help to protect your health and wellness after age 72. Screening and testing are the best way to find a health problem early and help you avoid having a fall. Early diagnosis and treatment give you the best chance for managing medical conditions that are more common for people who are older than age 72. Falls are a major cause of broken bones and head injuries in people who are older than age 72. Take precautions to prevent a fall at home. Work with your health care provider to learn what changes you can make to improve your health and wellness and to prevent falls. This information is not intended to replace advice given to you by your health care provider. Make sure you discuss any questions you have with your health care provider. Document Revised: 01/05/2021 Document Reviewed: 01/05/2021 Elsevier Patient Education  2024 Elsevier Inc.  

## 2023-03-10 NOTE — Assessment & Plan Note (Signed)
Clinically euvolemic. Stable and asymptomatic Continue Entresto 49-51 twice a day, Aldactone 12.5 mg daily, metoprolol succinate 50 mg daily, and Demadex 20 mg daily

## 2023-03-17 ENCOUNTER — Inpatient Hospital Stay (HOSPITAL_COMMUNITY): Admission: RE | Admit: 2023-03-17 | Payer: Medicare Other | Source: Ambulatory Visit

## 2023-03-18 ENCOUNTER — Telehealth: Payer: Self-pay | Admitting: Emergency Medicine

## 2023-03-18 NOTE — Telephone Encounter (Signed)
Patient saw Dr. Alvy Bimler earlier this month and he ordered a Cologuard kit for patient that arrived this week. Patient states that he was unaware that Cologuard had been ordered and that he had a colonoscopy last December at [facility] that was ordered by the [facility] after the Cologuard he did last year came back suspicious. Would like to know why another Cologuard has been ordered. Please return call at [phone].

## 2023-03-19 ENCOUNTER — Other Ambulatory Visit (HOSPITAL_COMMUNITY): Payer: Self-pay | Admitting: Physician Assistant

## 2023-03-19 DIAGNOSIS — I5082 Biventricular heart failure: Secondary | ICD-10-CM

## 2023-03-21 NOTE — Telephone Encounter (Signed)
Called and left message for patient to call office back in reference to his questions about his cologuard test

## 2023-03-22 ENCOUNTER — Other Ambulatory Visit (HOSPITAL_COMMUNITY): Payer: Self-pay

## 2023-03-22 NOTE — Telephone Encounter (Signed)
Pt wife called back returning call please advise.

## 2023-03-23 ENCOUNTER — Other Ambulatory Visit (HOSPITAL_COMMUNITY): Payer: Self-pay

## 2023-03-23 DIAGNOSIS — I5082 Biventricular heart failure: Secondary | ICD-10-CM

## 2023-03-23 MED ORDER — EMPAGLIFLOZIN 10 MG PO TABS
10.0000 mg | ORAL_TABLET | Freq: Every day | ORAL | 11 refills | Status: DC
Start: 2023-03-23 — End: 2024-03-28

## 2023-03-23 NOTE — Telephone Encounter (Signed)
Spoke to pharmacy, insurance was updated and copay confirmed $0 after grant. Nothing further needed at this time

## 2023-03-30 ENCOUNTER — Encounter (INDEPENDENT_AMBULATORY_CARE_PROVIDER_SITE_OTHER): Payer: Self-pay

## 2023-04-07 ENCOUNTER — Inpatient Hospital Stay (HOSPITAL_COMMUNITY)
Admission: RE | Admit: 2023-04-07 | Discharge: 2023-04-07 | Disposition: A | Discharge status: Home / Self Care | Payer: Medicare Other | Source: Ambulatory Visit

## 2023-04-08 ENCOUNTER — Ambulatory Visit (HOSPITAL_COMMUNITY)
Admission: RE | Admit: 2023-04-08 | Discharge: 2023-04-08 | Disposition: A | Discharge status: Home / Self Care | Payer: Medicare Other | Source: Ambulatory Visit | Attending: Urology | Admitting: Urology

## 2023-04-08 ENCOUNTER — Other Ambulatory Visit (HOSPITAL_COMMUNITY): Payer: Self-pay | Admitting: Urology

## 2023-04-08 DIAGNOSIS — D49512 Neoplasm of unspecified behavior of left kidney: Secondary | ICD-10-CM | POA: Insufficient documentation

## 2023-04-13 ENCOUNTER — Other Ambulatory Visit (HOSPITAL_COMMUNITY): Payer: Self-pay | Admitting: Internal Medicine

## 2023-04-13 DIAGNOSIS — I5082 Biventricular heart failure: Secondary | ICD-10-CM

## 2023-04-20 NOTE — Progress Notes (Signed)
Advanced Heart Failure Clinic Note    Primary Care: Edwina Barth, MD HF Cardiologist:  Dr. Gala Romney  HPI:  Troy May is a 72 y.o. male w/ HTN, HLD, CAD, and BiV CHF/iCM.   Admitted 06/2021 w/ CP, Influenza A+. Hypertensive on arrival requiring labetalol gtt, CT chest - PE but + for cardiomegaly, coronary CA2+, mild aneurysmal dilatation of the ascending thoracic aorta 43 mm; bilateral renal masses suspicious for RCC.   Echo 06/2021 EF 25-30% w/LV apical thrombus G2DD, RV moderately HK. R/LHC severe 2V CAD w/ occluded LAD & RCA,  elevated filling pressures w/ low output, CI 1.7. No good options for revasc -> med rx. cMRI LVEF 21%, LAD/RCA territories with scar and do not appear viable, large LV thrombus. Discharge weight 172 lbs.   Echo 10/2021: EF 25-30%, + apical thrombus, moderate MR/TR   Follow up 10/2021. metoprolol XL 25 mg added. Referred to EP for ICD. Saw Dr. Graciela Husbands for evaluation 12/11/21. Wanted to think about ICD.   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 Follows with Urology q 6 months for surveillance.   Follow up 01/2022, off all meds for a few weeks. GDMT restarted. Declined ICD.   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 Post op complicated by acute hypoxic respiratory failure. VQ scan revealed PE. Also found to have left leg DVT, provoked in setting of surgery.  Echo 12/2022 EF 25-30%, LV with RWMA with akinesis of the apex.  Mod concentric LVH. GIDD, large apical thrombus. RV mildly reduced, trivial MR. Started on Xarelto. Plan for 15 mg BID for 21 days, then 20mg  daily thereafter (for LV thrombus treatment - patient had recurrent LV thrombus). Discharged on Jardiance, metoprolol and PRN torsemide. Entresto and spironolactone on hold, discharge weight 202 lbs.    Returned to [facility] for post hospital follow up with his  wife 01/28/23. Overall was feeling pretty good. Denied palpitations, CP, dizziness, edema, or PND/Orthopnea. No SOB. Appetite was pretty good. No fever or chills. Weight at home was 200-205 pounds. Reported taking all medications. Denied ETOH, smoking. Had not taken any torsemide recently.   Today he returns to HF clinic for pharmacist medication titration with his wife. At recent visits to clinic, Entresto 49/51 mg BID and spironolactone 12.5 mg daily were restarted. Overall he is feeling well today. No dizziness, lightheadedness, CP or palpitations. No SOB/DOE. Has been doing yard work with no difficulties. Has not been weighing himself at home. Weight has increased ~8 lbs from last visit in June He attributes this to dietary indiscretion and has recently been trying to eat better and decrease the amount of junk food in his diet. No LEE, PND or orthopnea. Has not needed any PRN torsemide. Taking and tolerating all medications.     HF Medications: Metoprolol succinate 50 mg daily Entresto 49/51 mg BID Spironolactone 12.5 mg daily Jardiance 10 mg daily Torsemide 20 mg PRN  Has the patient been experiencing any side effects to the medications prescribed?  no  Does the patient have any problems obtaining medications due to transportation or finances?   [employer] Medicare. Has Orthoptist for [employer].   Understanding of regimen: good Understanding of indications: good Potential of compliance: good Patient understands to avoid NSAIDs. Patient understands to avoid decongestants.    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 - Echo (10/30/21): EF 25-30% + apical clot. Moderate MR/TR. - Echo 06/2022 EF 25-20% RV ok - Echo 12/2022 EF 25-30%, LV with RWMA with akinesis of the apex.  Mod concentric LVH. GIDD, large apical thrombus.  RV mildly reduced, trivial MR. - NYHA I, euvolemic on exam.  - BMET today pending.  - Continue torsemide 20 mg PRN. Has not needed recently.  - Continue metoprolol XL 50 mg qhs. - Increase Entresto to 97/103 mg BID.   - Continue spironolactone 12.5 mg daily.  - Continue Jardiance 10 mg daily.  - Has seen EP. Declines ICD.   2. CAD - Cath 06/2021 severe 2V CAD, mid-distal LAD 100% stenosed, ost-prox RCA 100% stenosed  - cMRI 06/2021  EF 21% and larg  inferior and apical scars with minimal viability in the RCA/LAD territories. Large LV clot.  - No options for revascularization, continue medical therapy. - No s/s angina - Continue  atorvastatin 80 + ? blocker. - On Xarelto, denies bleeding. Off aspirin with Xarelto.    3. LV Thrombus  - 2/2 severe LV dysfunction, seen again on echo 12/2022 - With recurrent LV thrombus now on Xarelto 20 mg daily. Will need to be on A/C indefinitely.    4. Bilateral Renal Masses, suspicion for RCC - s/p resection 01/07/23   5. Hypertension  - BP at goal - Increase Entresto as above   6. Ascending Thoracic Aortic Aneurysm - 43 mm in diameter on chest CT - Asc Ao 4.0 cm on echo 03/23 stable at 4.1 on echo 06/2022 - Asc Ao 3.8 on 5/24 echo - Continue to follow   7. CKD 3a - continue SGLT2i   8. PE/DVT - on Xarelto as above - provoked DVT post surgery  Follow up 1 week with Dr. Elyse Jarvis, PharmD, BCPS, [facility], CPP Heart Failure Clinic Pharmacist [phone]

## 2023-04-21 ENCOUNTER — Ambulatory Visit: Payer: Medicare Other | Admitting: Emergency Medicine

## 2023-05-03 ENCOUNTER — Ambulatory Visit (INDEPENDENT_AMBULATORY_CARE_PROVIDER_SITE_OTHER): Payer: Medicare Other | Admitting: Emergency Medicine

## 2023-05-03 ENCOUNTER — Encounter: Payer: Self-pay | Admitting: Emergency Medicine

## 2023-05-03 ENCOUNTER — Ambulatory Visit (HOSPITAL_COMMUNITY)
Admission: RE | Admit: 2023-05-03 | Discharge: 2023-05-03 | Disposition: A | Discharge status: Home / Self Care | Payer: Medicare Other | Source: Ambulatory Visit | Attending: Cardiology | Admitting: Cardiology

## 2023-05-03 VITALS — BP 116/70 | HR 65 | Wt 210.8 lb

## 2023-05-03 VITALS — BP 118/66 | HR 65 | Temp 98.1°F | Resp 16 | Ht 70.0 in | Wt 210.5 lb

## 2023-05-03 DIAGNOSIS — Z1211 Encounter for screening for malignant neoplasm of colon: Secondary | ICD-10-CM

## 2023-05-03 DIAGNOSIS — I513 Intracardiac thrombosis, not elsewhere classified: Secondary | ICD-10-CM | POA: Diagnosis not present

## 2023-05-03 DIAGNOSIS — I5022 Chronic systolic (congestive) heart failure: Secondary | ICD-10-CM | POA: Diagnosis not present

## 2023-05-03 DIAGNOSIS — N1831 Chronic kidney disease, stage 3a: Secondary | ICD-10-CM | POA: Insufficient documentation

## 2023-05-03 DIAGNOSIS — Z7901 Long term (current) use of anticoagulants: Secondary | ICD-10-CM | POA: Insufficient documentation

## 2023-05-03 DIAGNOSIS — I130 Hypertensive heart and chronic kidney disease with heart failure and stage 1 through stage 4 chronic kidney disease, or unspecified chronic kidney disease: Secondary | ICD-10-CM | POA: Insufficient documentation

## 2023-05-03 DIAGNOSIS — E785 Hyperlipidemia, unspecified: Secondary | ICD-10-CM

## 2023-05-03 DIAGNOSIS — Z760 Encounter for issue of repeat prescription: Secondary | ICD-10-CM | POA: Diagnosis present

## 2023-05-03 DIAGNOSIS — I480 Paroxysmal atrial fibrillation: Secondary | ICD-10-CM

## 2023-05-03 DIAGNOSIS — N2889 Other specified disorders of kidney and ureter: Secondary | ICD-10-CM | POA: Insufficient documentation

## 2023-05-03 DIAGNOSIS — Z86711 Personal history of pulmonary embolism: Secondary | ICD-10-CM | POA: Diagnosis not present

## 2023-05-03 DIAGNOSIS — I10 Essential (primary) hypertension: Secondary | ICD-10-CM

## 2023-05-03 DIAGNOSIS — I255 Ischemic cardiomyopathy: Secondary | ICD-10-CM | POA: Diagnosis not present

## 2023-05-03 DIAGNOSIS — Z86718 Personal history of other venous thrombosis and embolism: Secondary | ICD-10-CM | POA: Insufficient documentation

## 2023-05-03 DIAGNOSIS — I25118 Atherosclerotic heart disease of native coronary artery with other forms of angina pectoris: Secondary | ICD-10-CM

## 2023-05-03 DIAGNOSIS — I5082 Biventricular heart failure: Secondary | ICD-10-CM | POA: Insufficient documentation

## 2023-05-03 DIAGNOSIS — I2510 Atherosclerotic heart disease of native coronary artery without angina pectoris: Secondary | ICD-10-CM | POA: Insufficient documentation

## 2023-05-03 LAB — BASIC METABOLIC PANEL
Anion gap: 10 (ref 5–15)
BUN: 21 mg/dL (ref 8–23)
CO2: 22 mmol/L (ref 22–32)
Calcium: 9.4 mg/dL (ref 8.9–10.3)
Chloride: 104 mmol/L (ref 98–111)
Creatinine, Ser: 1.39 mg/dL — ABNORMAL HIGH (ref 0.61–1.24)
GFR, Estimated: 54 mL/min — ABNORMAL LOW (ref >60)
Glucose, Bld: 112 mg/dL — ABNORMAL HIGH (ref 70–99)
Potassium: 4.7 mmol/L (ref 3.5–5.1)
Sodium: 136 mmol/L (ref 135–145)

## 2023-05-03 MED ORDER — ENTRESTO 97-103 MG PO TABS: 1.0000 | ORAL_TABLET | Freq: Two times a day (BID) | ORAL | 3 refills | Status: DC

## 2023-05-03 NOTE — Assessment & Plan Note (Signed)
Stable.  No anginal episodes. 

## 2023-05-03 NOTE — Assessment & Plan Note (Signed)
Stable.  No findings of acute CHF. Follows up with cardiologist today at 1 PM Continues Entresto 49-51 twice a day, Jardiance 10 mg daily, Aldactone 12.5 mg daily, metoprolol succinate 50 mg daily, and torsemide 20 mg daily

## 2023-05-03 NOTE — Assessment & Plan Note (Signed)
Stable chronic condition Continues atorvastatin 80 mg daily. Diet and nutrition discussed.

## 2023-05-03 NOTE — Patient Instructions (Signed)
Heart Failure, Diagnosis  Heart failure means that your heart is not able to pump blood in the right way. This makes it hard for your body to work well. Heart failure is usually a long-term (chronic) condition. You must take good care of yourself and follow your treatment plan from your doctor. Different stages of heart failure have different treatment plans. The stages are: Stage A: At risk for heart failure. Stage B: Pre-heart failure. Stage C: Symptomatic heart failure. Stage D: Advanced heart failure. What are the causes? High blood pressure. Buildup of cholesterol and fat in the arteries. Heart attack. This injures the heart muscle. Heart valves that do not open and close properly. Damage of the heart muscle. This is also called cardiomyopathy. Infection of the heart muscle. This is also called myocarditis. Lung disease. What increases the risk? Getting older. The risk of heart failure goes up as a person ages. Being overweight. Using tobacco or nicotine products. Abusing alcohol or drugs. Having taken medicines that can damage the heart. Having any of these conditions: Diabetes. Abnormal heart rhythms. Thyroid problems. Low blood counts (anemia). Having a family history of heart failure. What are the signs or symptoms? Shortness of breath. Coughing. Swelling of the feet, ankles, legs, or belly. Losing or gaining weight for no reason. Trouble breathing. Waking from sleep because of the need to sit up and get more air. Fast heartbeat. Other symptoms may include: Being very tired. Feeling dizzy, or feeling like you may pass out (faint). Having no desire to eat. Feeling like you may vomit (nauseous). Peeing (urinating) more at night. Feeling confused. How is this treated? This condition may be treated with: Medicines. These can be given to treat blood pressure and to make the heart muscles stronger. Changes in your daily life. These may include: Eating a healthy  diet. Staying at a healthy body weight. Quitting tobacco, alcohol, and drug use. Doing exercises. Participating in a cardiac rehabilitation program. This program helps you improve your health through exercise, education, and counseling. Surgery. Surgery can be done to open blocked valves or to put devices in the heart, such as pacemakers. A donor heart (heart transplant). You will receive a healthy heart from a donor. Follow these instructions at home: Treat other conditions as told by your doctor. These may include high blood pressure, diabetes, thyroid disease, or abnormal heart rhythms. Learn as much as you can about heart failure. Get support as you need it. Keep all follow-up visits. Where to find more information American Heart Association: www.heart.org Centers for Disease Control and Prevention: www.cdc.gov National Institute on Aging: www.nia.nih.gov Summary Heart failure means that your heart is not able to pump blood in the right way. This condition is often caused by high blood pressure, heart attack, or damage of the heart muscle. Symptoms of this condition include shortness of breath and swelling of the feet, ankles, legs, or belly. You may also feel very tired or feel like you may vomit. You may be treated with medicines, surgery, or changes in your daily life. Treat other health conditions as told by your doctor. This information is not intended to replace advice given to you by your health care provider. Make sure you discuss any questions you have with your health care provider. Document Revised: 02/12/2021 Document Reviewed: 03/08/2020 Elsevier Patient Education  2024 Elsevier Inc.  

## 2023-05-03 NOTE — Assessment & Plan Note (Signed)
Presently normal sinus rhythm with well-controlled ventricular rate Continues metoprolol succinate 50 mg daily and Xarelto 20 mg daily No clinical bleeding episodes Fall precautions discussed

## 2023-05-03 NOTE — Progress Notes (Signed)
Blythe Stanford 72 y.o.   Chief Complaint  Patient presents with   Medical Management of Chronic Issues    HISTORY OF PRESENT ILLNESS: This is a 72 y.o. male here for follow-up of chronic medical problems Overall doing well. Schedule to see cardiologist today at 1 PM Has no complaints or medical concerns today. Wt Readings from Last 3 Encounters:  05/03/23 210 lb 8 oz (95.5 kg)  03/10/23 202 lb 2 oz (91.7 kg)  02/24/23 200 lb (90.7 kg)   BP Readings from Last 3 Encounters:  05/03/23 118/66  03/10/23 130/82  02/24/23 122/70     HPI   Prior to Admission medications   Medication Sig Start Date End Date Taking? Authorizing Provider  atorvastatin (LIPITOR) 80 MG tablet Take 1 tablet (80 mg total) by mouth daily. 02/24/23  Yes Bensimhon, Bevelyn Buckles, MD  empagliflozin (JARDIANCE) 10 MG TABS tablet Take 1 tablet (10 mg total) by mouth daily. 03/23/23  Yes Bensimhon, Bevelyn Buckles, MD  metoprolol succinate (TOPROL-XL) 50 MG 24 hr tablet TAKE 1 TABLET BY MOUTH EVERYDAY AT BEDTIME 04/13/23  Yes Bensimhon, Bevelyn Buckles, MD  Multiple Vitamin (MULTIVITAMIN WITH MINERALS) TABS tablet Take 1 tablet by mouth daily.   Yes [provider]  sacubitril-valsartan (ENTRESTO) 49-51 MG Take 1 tablet by mouth 2 (two) times daily. 02/24/23  Yes Bensimhon, Bevelyn Buckles, MD  sildenafil (VIAGRA) 100 MG tablet Take 0.5-1 tablets (50-100 mg total) by mouth daily as needed for erectile dysfunction. 03/10/23  Yes Dhruvan Gullion, Eilleen Kempf, MD  torsemide (DEMADEX) 20 MG tablet Take 1 tablet (20 mg total) by mouth daily as needed (swelling or weight gain). 01/18/23  Yes Zigmund Daniel., MD  rivaroxaban (XARELTO) 20 MG TABS tablet Take 1 tablet (20 mg total) by mouth daily with supper. (Start this prescription after your finish the xarelto starter pack.  Follow up with cardiology for refills after this. 02/15/23 04/16/23  Zigmund Daniel., MD  spironolactone (ALDACTONE) 25 MG tablet Take 0.5 tablets (12.5 mg total) by mouth  daily. 01/28/23 04/28/23  Alen Bleacher, NP    No Known Allergies  Patient Active Problem List   Diagnosis Date Noted   Erectile dysfunction due to arterial insufficiency 03/10/2023   Renal cell carcinoma (HCC) 01/14/2023   Type 2 diabetes mellitus (HCC) 01/12/2023   Biventricular heart failure (HCC) 01/12/2023   Paroxysmal atrial fibrillation (HCC) 01/12/2023   Renal mass 01/07/2023   Dyslipidemia 08/31/2022   Coronary artery disease of native artery of native heart with stable angina pectoris (HCC) 02/08/2022   Chronic systolic heart failure (HCC) 08/05/2021   Essential hypertension 07/25/2021   Left ventricular apical thrombus     Past Medical History:  Diagnosis Date   Ascending aortic aneurysm (HCC)    Bilateral renal masses    CAD (coronary artery disease)    CHF (congestive heart failure) (HCC)    HLD (hyperlipidemia)    Hypertension    LV (left ventricular) mural thrombus    Systolic heart failure (HCC)     Past Surgical History:  Procedure Laterality Date   CARDIAC CATHETERIZATION     RIGHT/LEFT HEART CATH AND CORONARY ANGIOGRAPHY N/A 07/27/2021   Procedure: RIGHT/LEFT HEART CATH AND CORONARY ANGIOGRAPHY;  Surgeon: Dolores Patty, MD;  Location: [facility];  Service: Cardiovascular;  Laterality: N/A;   ROBOTIC ASSITED PARTIAL NEPHRECTOMY Left 01/07/2023   Procedure: XI ROBOTIC ASSITED LEFT PARTIAL NEPHRECTOMY;  Surgeon: Crist Fat, MD;  Location: [facility];  Service: Urology;  Laterality: Left;  210 MINUTES    Social History   Socioeconomic History   Marital status: Married    Spouse name: Not on file   Number of children: 2   Years of education: 12   Highest education level: High school graduate  Occupational History   Occupation: Retired  Tobacco Use   Smoking status: Former    Current packs/day: 0.00    Types: Cigarettes    Quit date: 07/27/2021    Years since quitting: 1.7   Smokeless tobacco: Never  Vaping Use   Vaping status:  Never Used  Substance and Sexual Activity   Alcohol use: Not Currently   Drug use: Never   Sexual activity: Not on file  Other Topics Concern   Not on file  Social History Narrative   Not on file   Social Determinants of Health   Financial Resource Strain: Low Risk  (09/03/2022)   Overall Financial Resource Strain (CARDIA)    Difficulty of Paying Living Expenses: Not hard at all  Food Insecurity: No Food Insecurity (01/07/2023)   Hunger Vital Sign    Worried About Running Out of Food in the Last Year: Never true    Ran Out of Food in the Last Year: Never true  Transportation Needs: No Transportation Needs (01/07/2023)   PRAPARE - Administrator, Civil Service (Medical): No    Lack of Transportation (Non-Medical): No  Physical Activity: Sufficiently Active (09/03/2022)   Exercise Vital Sign    Days of Exercise per Week: 5 days    Minutes of Exercise per Session: 30 min  Stress: No Stress Concern Present (09/03/2022)   [employer] of Occupational Health - Occupational Stress Questionnaire    Feeling of Stress : Not at all  Social Connections: Socially Integrated (09/03/2022)   Social Connection and Isolation Panel [NHANES]    Frequency of Communication with Friends and Family: More than three times a week    Frequency of Social Gatherings with Friends and Family: More than three times a week    Attends Religious Services: More than 4 times per year    Active Member of [employer] or Organizations: Yes    Attends Engineer, structural: More than 4 times per year    Marital Status: Married  Catering manager Violence: Not At Risk (01/07/2023)   Humiliation, Afraid, Rape, and Kick questionnaire    Fear of Current or Ex-Partner: No    Emotionally Abused: No    Physically Abused: No    Sexually Abused: No    Family History  Problem Relation Age of Onset   Hypertension Brother    Heart failure Neg Hx      Review of Systems  Constitutional: Negative.  Negative for  chills and fever.  HENT: Negative.  Negative for congestion, nosebleeds and sore throat.   Respiratory: Negative.  Negative for cough, hemoptysis and shortness of breath.   Cardiovascular: Negative.  Negative for chest pain and palpitations.  Gastrointestinal:  Negative for abdominal pain, diarrhea, nausea and vomiting.  Genitourinary:  Negative for hematuria.  Skin: Negative.  Negative for rash.  Neurological: Negative.  Negative for dizziness and headaches.  All other systems reviewed and are negative.   Vitals:   05/03/23 1020  BP: 118/66  Pulse: 65  Resp: 16  Temp: 98.1 F (36.7 C)  SpO2: 95%    Physical Exam Vitals reviewed.  Constitutional:      Appearance: Normal appearance.  HENT:  Head: Normocephalic.  Eyes:     Extraocular Movements: Extraocular movements intact.     Conjunctiva/sclera: Conjunctivae normal.     Pupils: Pupils are equal, round, and reactive to light.  Cardiovascular:     Rate and Rhythm: Normal rate and regular rhythm.     Pulses: Normal pulses.     Heart sounds: Normal heart sounds.  Pulmonary:     Effort: Pulmonary effort is normal.     Breath sounds: Normal breath sounds.  Musculoskeletal:     Cervical back: No tenderness.     Right lower leg: No edema.     Left lower leg: No edema.  Lymphadenopathy:     Cervical: No cervical adenopathy.  Skin:    General: Skin is warm and dry.     Capillary Refill: Capillary refill takes less than 2 seconds.  Neurological:     General: No focal deficit present.     Mental Status: He is alert and oriented to person, place, and time.  Psychiatric:        Mood and Affect: Mood normal.        Behavior: Behavior normal.      ASSESSMENT & PLAN: A total of 44 minutes was spent with the patient and counseling/coordination of care regarding preparing for this visit, review of most recent office visit notes, review of multiple chronic medical conditions under management, review of most recent blood work  results, review of all medications, cardiovascular risks associated with hypertension and dyslipidemia, education on nutrition, prognosis, documentation and need for follow-up.  Problem List Items Addressed This Visit       Cardiovascular and Mediastinum   Essential hypertension    BP Readings from Last 3 Encounters:  05/03/23 118/66  03/10/23 130/82  02/24/23 122/70  Well-controlled hypertension Continue metoprolol succinate 50 mg, Aldactone 12.5 mg daily and Entresto 49-51 twice a day Cardiovascular risks associated with hypertension discussed       Left ventricular apical thrombus    Stable.  Continue Xarelto 20 mg daily.      Chronic systolic heart failure (HCC) - Primary    Stable.  No findings of acute CHF. Follows up with cardiologist today at 1 PM Continues Entresto 49-51 twice a day, Jardiance 10 mg daily, Aldactone 12.5 mg daily, metoprolol succinate 50 mg daily, and torsemide 20 mg daily      Coronary artery disease of native artery of native heart with stable angina pectoris (HCC)    Stable.  No anginal episodes.      Paroxysmal atrial fibrillation (HCC)    Presently normal sinus rhythm with well-controlled ventricular rate Continues metoprolol succinate 50 mg daily and Xarelto 20 mg daily No clinical bleeding episodes Fall precautions discussed        Other   Dyslipidemia    Stable chronic condition Continues atorvastatin 80 mg daily. Diet and nutrition discussed.      Other Visit Diagnoses     Colon cancer screening       Relevant Orders   Ambulatory referral to Gastroenterology      Patient Instructions  Heart Failure, Diagnosis  Heart failure means that your heart is not able to pump blood in the right way. This makes it hard for your body to work well. Heart failure is usually a long-term (chronic) condition. You must take good care of yourself and follow your treatment plan from your doctor. Different stages of heart failure have different  treatment plans. The stages are: Stage A: At risk  for heart failure. Stage B: Pre-heart failure. Stage C: Symptomatic heart failure. Stage D: Advanced heart failure. What are the causes? High blood pressure. Buildup of cholesterol and fat in the arteries. Heart attack. This injures the heart muscle. Heart valves that do not open and close properly. Damage of the heart muscle. This is also called cardiomyopathy. Infection of the heart muscle. This is also called myocarditis. Lung disease. What increases the risk? Getting older. The risk of heart failure goes up as a person ages. Being overweight. Using tobacco or nicotine products. Abusing alcohol or drugs. Having taken medicines that can damage the heart. Having any of these conditions: Diabetes. Abnormal heart rhythms. Thyroid problems. Low blood counts (anemia). Having a family history of heart failure. What are the signs or symptoms? Shortness of breath. Coughing. Swelling of the feet, ankles, legs, or belly. Losing or gaining weight for no reason. Trouble breathing. Waking from sleep because of the need to sit up and get more air. Fast heartbeat. Other symptoms may include: Being very tired. Feeling dizzy, or feeling like you may pass out (faint). Having no desire to eat. Feeling like you may vomit (nauseous). Peeing (urinating) more at night. Feeling confused. How is this treated? This condition may be treated with: Medicines. These can be given to treat blood pressure and to make the heart muscles stronger. Changes in your daily life. These may include: Eating a healthy diet. Staying at a healthy body weight. Quitting tobacco, alcohol, and drug use. Doing exercises. Participating in a cardiac rehabilitation program. This program helps you improve your health through exercise, education, and counseling. Surgery. Surgery can be done to open blocked valves or to put devices in the heart, such as pacemakers. A  donor heart (heart transplant). You will receive a healthy heart from a donor. Follow these instructions at home: Treat other conditions as told by your doctor. These may include high blood pressure, diabetes, thyroid disease, or abnormal heart rhythms. Learn as much as you can about heart failure. Get support as you need it. Keep all follow-up visits. Where to find more information American Heart Association: www.heart.org Centers for Disease Control and Prevention: FootballExhibition.com.br [employer] on Aging: https://walker.com/ Summary Heart failure means that your heart is not able to pump blood in the right way. This condition is often caused by high blood pressure, heart attack, or damage of the heart muscle. Symptoms of this condition include shortness of breath and swelling of the feet, ankles, legs, or belly. You may also feel very tired or feel like you may vomit. You may be treated with medicines, surgery, or changes in your daily life. Treat other health conditions as told by your doctor. This information is not intended to replace advice given to you by your health care provider. Make sure you discuss any questions you have with your health care provider. Document Revised: 02/12/2021 Document Reviewed: 03/08/2020 Elsevier Patient Education  2024 Elsevier Inc.        Edwina Barth, MD [facility]

## 2023-05-03 NOTE — Assessment & Plan Note (Signed)
BP Readings from Last 3 Encounters:  05/03/23 118/66  03/10/23 130/82  02/24/23 122/70  Well-controlled hypertension Continue metoprolol succinate 50 mg, Aldactone 12.5 mg daily and Entresto 49-51 twice a day Cardiovascular risks associated with hypertension discussed

## 2023-05-03 NOTE — Patient Instructions (Signed)
It was a pleasure seeing you today!  MEDICATIONS: -We are changing your medications today --Increase Entresto to 97/103 mg (1 tablet) twice daily. You may take 2 tablets of the 49/51 mg strength twice daily until you receive the new strength.  -Call if you have questions about your medications.  LABS: -We will call you if your labs need attention.  NEXT APPOINTMENT: Return to clinic in 1 week with Dr. Gala Romney.  In general, to take care of your heart failure: -Limit your fluid intake to 2 Liters (half-gallon) per day.   -Limit your salt intake to ideally 2-3 grams (2000-3000 mg) per day. -Weigh yourself daily and record, and bring that "weight diary" to your next appointment.  (Weight gain of 2-3 pounds in 1 day typically means fluid weight.) -The medications for your heart are to help your heart and help you live longer.   -Please contact us before stopping any of your heart medications.  Call the clinic at [phone] with questions or to reschedule future appointments.

## 2023-05-03 NOTE — Assessment & Plan Note (Signed)
Stable.  Continue Xarelto 20mg daily

## 2023-05-13 ENCOUNTER — Ambulatory Visit (HOSPITAL_COMMUNITY)
Admission: RE | Admit: 2023-05-13 | Discharge: 2023-05-13 | Disposition: A | Discharge status: Home / Self Care | Payer: Medicare Other | Source: Ambulatory Visit | Attending: Internal Medicine | Admitting: Internal Medicine

## 2023-05-13 ENCOUNTER — Encounter (HOSPITAL_COMMUNITY): Payer: Self-pay | Admitting: Internal Medicine

## 2023-05-13 VITALS — BP 122/84 | HR 69 | Wt 208.2 lb

## 2023-05-13 DIAGNOSIS — I5022 Chronic systolic (congestive) heart failure: Secondary | ICD-10-CM

## 2023-05-13 DIAGNOSIS — Z905 Acquired absence of kidney: Secondary | ICD-10-CM | POA: Insufficient documentation

## 2023-05-13 DIAGNOSIS — I255 Ischemic cardiomyopathy: Secondary | ICD-10-CM | POA: Insufficient documentation

## 2023-05-13 DIAGNOSIS — I5082 Biventricular heart failure: Secondary | ICD-10-CM | POA: Insufficient documentation

## 2023-05-13 DIAGNOSIS — N1831 Chronic kidney disease, stage 3a: Secondary | ICD-10-CM | POA: Diagnosis not present

## 2023-05-13 DIAGNOSIS — Z79899 Other long term (current) drug therapy: Secondary | ICD-10-CM | POA: Insufficient documentation

## 2023-05-13 DIAGNOSIS — I513 Intracardiac thrombosis, not elsewhere classified: Secondary | ICD-10-CM

## 2023-05-13 DIAGNOSIS — I714 Abdominal aortic aneurysm, without rupture, unspecified: Secondary | ICD-10-CM

## 2023-05-13 DIAGNOSIS — I2510 Atherosclerotic heart disease of native coronary artery without angina pectoris: Secondary | ICD-10-CM | POA: Diagnosis not present

## 2023-05-13 DIAGNOSIS — Z7984 Long term (current) use of oral hypoglycemic drugs: Secondary | ICD-10-CM | POA: Insufficient documentation

## 2023-05-13 DIAGNOSIS — I7121 Aneurysm of the ascending aorta, without rupture: Secondary | ICD-10-CM | POA: Insufficient documentation

## 2023-05-13 DIAGNOSIS — Z7901 Long term (current) use of anticoagulants: Secondary | ICD-10-CM | POA: Diagnosis not present

## 2023-05-13 DIAGNOSIS — I130 Hypertensive heart and chronic kidney disease with heart failure and stage 1 through stage 4 chronic kidney disease, or unspecified chronic kidney disease: Secondary | ICD-10-CM | POA: Diagnosis present

## 2023-05-13 NOTE — Patient Instructions (Signed)
Medication Changes: No Changes In Medications at this time.   Follow-Up in: 9 months PLEASE CALL OUR OFFICE AROUND APRIL 2025 TO GET SCHEDULED FOR YOUR APPOINTMENT. PHONE NUMBER IS [phone] OPTION 2  At [facility], you and your health needs are our priority. We have a designated team specialized in the treatment of Heart Failure. This Care Team includes your primary Heart Failure Specialized Cardiologist (physician), Advanced Practice Providers (APPs- Physician Assistants and Nurse Practitioners), and Pharmacist who all work together to provide you with the care you need, when you need it.   You may see any of the following providers on your designated Care Team at your next follow up:  Dr. Arvilla Meres Dr. Marca Ancona Dr. Marcos Eke, NP Robbie Lis, [place] [facility] [place], [place] Brynda Peon, NP Karle Plumber, PharmD   Please be sure to bring in all your medications bottles to every appointment.   Need to Contact us:  If you have any questions or concerns before your next appointment please send [place] a message through [place] or call our office at [phone].    TO LEAVE A MESSAGE FOR THE NURSE SELECT OPTION 2, PLEASE LEAVE A MESSAGE INCLUDING: YOUR NAME DATE OF BIRTH CALL BACK NUMBER REASON FOR CALL**this is important as we prioritize the call backs  YOU WILL RECEIVE A CALL BACK THE SAME DAY AS LONG AS YOU CALL BEFORE 4:00 PM

## 2023-05-13 NOTE — Progress Notes (Signed)
[facility]  NOTE  Primary Care: Edwina Barth, MD HF Cardiologist:  Dr. Gala Romney  HPI: Marc Miars is a 72 y.o. male w/ HTN, HLD, CAD, and BiV CHF/iCM.  Admitted 11/22 w/ CP, Influenza A+. Hypertensive on arrival requiring labetalol gtt, CT chest - PE but + for cardiomegaly, coronary CA2+, mild aneurysmal dilatation of the ascending thoracic aorta 43 mm; bilateral renal masses suspicious for RCC.  Echo 11/22 EF 25-30% w/LV apical thrombus G2DD, RV moderately HK. R/LHC severe 2V CAD w/ occluded LAD & RCA,  elevated filling pressures w/ low output, CI 1.7. No good options for revasc -> med rx. cMRI LVEF 21%, LAD/RCA territories with scar and do not appear viable, large LV thrombus. Discharge weight 172 lbs.  Echo 3/23: EF 25-30%, + apical thrombus, moderate MR/TR  Follow up 3/23. Toprol XL 25 added. Referred to EP for ICD. Saw Dr. Graciela Husbands for evaluation 12/11/21. Wanted to think about ICD.  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 Follows with Urology q 6 months for surveillance.  Follow up 6/23, off all meds for a few weeks. GDMT restarted. Declined ICD.  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 Post op complicated by acute hypoxic respiratory failure. VQ scan revealed PE. Also found to have left leg DVT, provoked in setting of surgery.  Echo 5/24 EF 25-30%, LV with RWMA with akinesis of the apex.  Mod concentric LVH. GIDD, large apical thrombus. RV mildly reduced, trivial MR. Started on Xarelto. Plan for 15mg  BID for 21 days, then 20mg  daily thereafter for 3-6 months (for LV thrombus treatment - patient had recurrent LV thrombus)   Today he returns for follow up with his wife. Overall feeling pretty good. Does all activities without too much problem. No CP or SOB. Compliant with meds.     Cardiac Studies: - Echo 5/24 EF 25-30%, LV  with RWMA with akinesis of the apex.  Mod concentric LVH. GIDD, large apical thrombus.  RV mildly reduced, trivial MR. - cMRI (11/22): EF 21% and minimal viability in RCA and LAD territories - [facility] 07/27/21:   Ost RCA to Prox RCA lesion is 100% stenosed.   Prox Cx to Mid Cx lesion is 30% stenosed.   1st Mrg lesion is 20% stenosed.   Prox LAD lesion is 60% stenosed.   Mid LAD lesion is 90% stenosed.   Mid LAD to Dist LAD lesion is 100% stenosed. Findings: Ao = 143/88 (109)  LV = 146/25 RA =  8 RV = 41/12 PA = 45/20 (29) PCW = 21 Fick cardiac output/index = 3.4/1.7 PVR = 2.4 WU SVR = 2383  FA sat = 95% PA sat = 54%, 57% PAPi 3.125   Past Medical History:  Diagnosis Date   Ascending aortic aneurysm (HCC)    Bilateral renal masses    CAD (coronary artery disease)    CHF (congestive heart failure) (HCC)    HLD (hyperlipidemia)    Hypertension    LV (left ventricular) mural thrombus    Systolic heart failure (HCC)    Current Outpatient Medications  Medication Sig Dispense Refill   atorvastatin (LIPITOR) 80 MG tablet Take 1 tablet (80 mg total) by mouth daily. 30 tablet 11   empagliflozin (JARDIANCE) 10 MG TABS tablet Take 1 tablet (10 mg total) by mouth daily. 30 tablet 11   metoprolol succinate (TOPROL-XL)  50 MG 24 hr tablet TAKE 1 TABLET BY MOUTH EVERYDAY AT BEDTIME 90 tablet 3   Multiple Vitamin (MULTIVITAMIN WITH MINERALS) TABS tablet Take 1 tablet by mouth daily.     rivaroxaban (XARELTO) 20 MG TABS tablet Take 1 tablet (20 mg total) by mouth daily with supper. (Start this prescription after your finish the xarelto starter pack.  Follow up with cardiology for refills after this. 30 tablet 1   sacubitril-valsartan (ENTRESTO) 97-103 MG Take 1 tablet by mouth 2 (two) times daily. 180 tablet 3   sildenafil (VIAGRA) 100 MG tablet Take 0.5-1 tablets (50-100 mg total) by mouth daily as needed for erectile dysfunction. 5 tablet 11   spironolactone (ALDACTONE) 25 MG tablet Take 0.5  tablets (12.5 mg total) by mouth daily. 15 tablet 11   torsemide (DEMADEX) 20 MG tablet Take 1 tablet (20 mg total) by mouth daily as needed (swelling or weight gain). 30 tablet 6   No current facility-administered medications for this encounter.   No Known Allergies  Social History   Socioeconomic History   Marital status: Married    Spouse name: Not on file   Number of children: 2   Years of education: 12   Highest education level: High school graduate  Occupational History   Occupation: Retired  Tobacco Use   Smoking status: Former    Current packs/day: 0.00    Types: Cigarettes    Quit date: 07/27/2021    Years since quitting: 1.7   Smokeless tobacco: Never  Vaping Use   Vaping status: Never Used  Substance and Sexual Activity   Alcohol use: Not Currently   Drug use: Never   Sexual activity: Not on file  Other Topics Concern   Not on file  Social History Narrative   Not on file   Social Determinants of Health   Financial Resource Strain: Low Risk  (09/03/2022)   Overall Financial Resource Strain (CARDIA)    Difficulty of Paying Living Expenses: Not hard at all  Food Insecurity: No Food Insecurity (01/07/2023)   Hunger Vital Sign    Worried About Running Out of Food in the Last Year: Never true    Ran Out of Food in the Last Year: Never true  Transportation Needs: No Transportation Needs (01/07/2023)   PRAPARE - Administrator, Civil Service (Medical): No    Lack of Transportation (Non-Medical): No  Physical Activity: Sufficiently Active (09/03/2022)   Exercise Vital Sign    Days of Exercise per Week: 5 days    Minutes of Exercise per Session: 30 min  Stress: No Stress Concern Present (09/03/2022)   [employer] of Occupational Health - Occupational Stress Questionnaire    Feeling of Stress : Not at all  Social Connections: Socially Integrated (09/03/2022)   Social Connection and Isolation Panel [NHANES]    Frequency of Communication with Friends and  Family: More than three times a week    Frequency of Social Gatherings with Friends and Family: More than three times a week    Attends Religious Services: More than 4 times per year    Active Member of [employer] or Organizations: Yes    Attends Banker Meetings: More than 4 times per year    Marital Status: Married  Catering manager Violence: Not At Risk (01/07/2023)   Humiliation, Afraid, Rape, and Kick questionnaire    Fear of Current or Ex-Partner: No    Emotionally Abused: No    Physically Abused: No  Sexually Abused: No   Family History  Problem Relation Age of Onset   Hypertension Brother    Heart failure Neg Hx    BP 122/84   Pulse 69   Wt 94.4 kg (208 lb 3.2 oz)   SpO2 96%   BMI 29.87 kg/m   Wt Readings from Last 3 Encounters:  05/13/23 94.4 kg (208 lb 3.2 oz)  05/03/23 95.6 kg (210 lb 12.8 oz)  05/03/23 95.5 kg (210 lb 8 oz)   PHYSICAL EXAM: General:  Well appearing. No resp difficulty HEENT: normal Neck: supple. no JVD. Carotids 2+ bilat; no bruits. No lymphadenopathy or thryomegaly appreciated. Cor: PMI nondisplaced. Regular rate & rhythm. No rubs, gallops or murmurs. Lungs: clear Abdomen: soft, nontender, nondistended. No hepatosplenomegaly. No bruits or masses. Good bowel sounds. Extremities: no cyanosis, clubbing, rash, edema Neuro: alert & orientedx3, cranial nerves grossly intact. moves all 4 extremities w/o difficulty. 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 - Echo (10/30/21): EF 25-30% + apical clot. Moderate MR/TR. - Echo 11/23 EF 25-20% RV ok - Echo 5/24 EF 25-30%, LV with RWMA with akinesis of the apex.  Mod concentric LVH. GIDD, large apical thrombus.  RV mildly  reduced, trivial MR. - Doing well. NYHA I-II Volume ok  - Continue Entresto 97/103 mg bid. - Continue spiro 12.5 mg daily.  - Continue torsemide 20 mg PRN. Has not needed recently.  - Continue Jardiance 10 mg daily.  - Continue Toprol XL 50 mg q hs. - Has seen EP. Declines ICD. We discussed again today.  - Labs from earlier this month reviewed and stable   2. CAD - Cath 11/22 severe 2V CAD, mid-distal LAD 100% stenosed, ost-prox RCA 100% stenosed  - cMRI 11/22  EF 21% and larg  inferior and apical scars with minimal viability in the RCA/LAD territories. Large LV clot.  - No options for revascularization, continue medical therapy. - No s/s angina - Continue  atorvastatin 80 + ? blocker. - On Xarelto   3. LV Thrombus  - 2/2 severe LV dysfunction, seen again on echo 5/24 - Continue lifelong AC   4. Bilateral Renal Masses, suspicion for RCC - s/p L partial nephrectomy  01/07/23   5. Hypertension  - Blood pressure well controlled. Continue current regimen.  6. Ascending Thoracic Aortic Aneurysm - 43 mm in diameter on chest CT - Asc Ao 4.0 cm on echo 03/23 stable at 4.1 on echo 11/23 - Asc Ao 3.8 on 5/24 echo - Continue to follow  7. CKD 3a - continue SGLT2i -Recent Scr 1.39. stable  8. PE/DVT - on Xarelto as above - provoked DVT post surgery   Arvilla Meres, MD  9:23 AM

## 2023-05-27 ENCOUNTER — Other Ambulatory Visit (HOSPITAL_COMMUNITY): Payer: Self-pay | Admitting: Cardiology

## 2023-05-27 MED ORDER — RIVAROXABAN 20 MG PO TABS: 20.0000 mg | ORAL_TABLET | Freq: Every day | ORAL | 11 refills | Status: DC

## 2023-06-03 ENCOUNTER — Telehealth: Payer: Self-pay | Admitting: Emergency Medicine

## 2023-06-03 NOTE — Telephone Encounter (Signed)
Called patient and informed him that since he had his colonoscopy done outside of [facility]. We will have to get his records. Faxing over record request.

## 2023-06-03 NOTE — Telephone Encounter (Signed)
Pt wife called inquiring about why pt has to schedule another colonoscopy when he had one back in December. Please advise.

## 2023-08-17 ENCOUNTER — Other Ambulatory Visit (HOSPITAL_COMMUNITY): Payer: Self-pay | Admitting: Internal Medicine

## 2023-09-06 NOTE — Progress Notes (Signed)
 This encounter was created in error - please disregard.  Called patient and VM was full.  I called pt's wife and she stated that she was not around patient.  Patient's wife informed me that she would have patient call office to reschedule visit.

## 2023-10-12 NOTE — Progress Notes (Signed)
[facility]  NOTE  Primary Care: Edwina Barth, MD HF Cardiologist:  Dr. Gala Romney  HPI: Troy May is a 73 y.o. male w/ HTN, HLD, CAD, and BiV CHF/iCM.  Admitted 11/22 w/ CP, Influenza A+. Hypertensive on arrival requiring labetalol gtt, CT chest - PE but + for cardiomegaly, coronary CA2+, mild aneurysmal dilatation of the ascending thoracic aorta 43 mm; bilateral renal masses suspicious for RCC.  Echo 11/22 EF 25-30% w/LV apical thrombus G2DD, RV moderately HK. R/LHC severe 2V CAD w/ occluded LAD & RCA,  elevated filling pressures w/ low output, CI 1.7. No good options for revasc -> med rx. cMRI LVEF 21%, LAD/RCA territories with scar and do not appear viable, large LV thrombus. Discharge weight 172 lbs.  Echo 3/23: EF 25-30%, + apical thrombus, moderate MR/TR  Follow up 3/23. Toprol XL 25 added. Referred to EP for ICD. Saw Dr. Graciela Husbands for evaluation 12/11/21. Wanted to think about ICD.  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 Follows with Urology q 6 months for surveillance.  Follow up 6/23, off all meds for a few weeks. GDMT restarted. Declined ICD.  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 Post op complicated by acute hypoxic respiratory failure. VQ scan revealed PE. Also found to have left leg DVT, provoked in setting of surgery.  Echo 5/24 EF 25-30%, LV with RWMA with akinesis of the apex.  Mod concentric LVH. GIDD, large apical thrombus. RV mildly reduced, trivial MR. Started on Xarelto. Plan for 15mg  BID for 21 days, then 20mg  daily thereafter for 3-6 months (for LV thrombus treatment - patient had recurrent LV thrombus)   Today he returns for follow up with his wife. Overall feeling pretty good. Does all activities without too much problem. No CP or SOB. Compliant with meds.     Cardiac Studies: - Echo 5/24 EF 25-30%, LV  with RWMA with akinesis of the apex.  Mod concentric LVH. GIDD, large apical thrombus.  RV mildly reduced, trivial MR. - cMRI (11/22): EF 21% and minimal viability in RCA and LAD territories - [facility] 07/27/21:   Ost RCA to Prox RCA lesion is 100% stenosed.   Prox Cx to Mid Cx lesion is 30% stenosed.   1st Mrg lesion is 20% stenosed.   Prox LAD lesion is 60% stenosed.   Mid LAD lesion is 90% stenosed.   Mid LAD to Dist LAD lesion is 100% stenosed. Findings: Ao = 143/88 (109)  LV = 146/25 RA =  8 RV = 41/12 PA = 45/20 (29) PCW = 21 Fick cardiac output/index = 3.4/1.7 PVR = 2.4 WU SVR = 2383  FA sat = 95% PA sat = 54%, 57% PAPi 3.125   Past Medical History:  Diagnosis Date   Ascending aortic aneurysm (HCC)    Bilateral renal masses    CAD (coronary artery disease)    CHF (congestive heart failure) (HCC)    HLD (hyperlipidemia)    Hypertension    LV (left ventricular) mural thrombus    Systolic heart failure (HCC)    Current Outpatient Medications  Medication Sig Dispense Refill   atorvastatin (LIPITOR) 80 MG tablet Take 1 tablet (80 mg total) by mouth daily. 30 tablet 11   empagliflozin (JARDIANCE) 10 MG TABS tablet Take 1 tablet (10 mg total) by mouth daily. 30 tablet 11   metoprolol succinate (TOPROL-XL)  50 MG 24 hr tablet TAKE 1 TABLET BY MOUTH EVERYDAY AT BEDTIME 90 tablet 3   Multiple Vitamin (MULTIVITAMIN WITH MINERALS) TABS tablet Take 1 tablet by mouth daily.     rivaroxaban (XARELTO) 20 MG TABS tablet Take 1 tablet (20 mg total) by mouth daily with supper. 30 tablet 11   sacubitril-valsartan (ENTRESTO) 97-103 MG Take 1 tablet by mouth 2 (two) times daily. 180 tablet 3   sildenafil (VIAGRA) 100 MG tablet Take 0.5-1 tablets (50-100 mg total) by mouth daily as needed for erectile dysfunction. 5 tablet 11   spironolactone (ALDACTONE) 25 MG tablet Take 0.5 tablets (12.5 mg total) by mouth daily. 15 tablet 11   torsemide (DEMADEX) 20 MG tablet Take 1 tablet (20 mg total) by  mouth daily as needed (swelling or weight gain). 30 tablet 6   No current facility-administered medications for this visit.   No Known Allergies  Social History   Socioeconomic History   Marital status: Married    Spouse name: Not on file   Number of children: 2   Years of education: 12   Highest education level: High school graduate  Occupational History   Occupation: Retired  Tobacco Use   Smoking status: Former    Current packs/day: 0.00    Types: Cigarettes    Quit date: 07/27/2021    Years since quitting: 2.2   Smokeless tobacco: Never  Vaping Use   Vaping status: Never Used  Substance and Sexual Activity   Alcohol use: Not Currently   Drug use: Never   Sexual activity: Not on file  Other Topics Concern   Not on file  Social History Narrative   Not on file   Social Drivers of Health   Financial Resource Strain: Low Risk  (09/03/2022)   Overall Financial Resource Strain (CARDIA)    Difficulty of Paying Living Expenses: Not hard at all  Food Insecurity: No Food Insecurity (01/07/2023)   Hunger Vital Sign    Worried About Running Out of Food in the Last Year: Never true    Ran Out of Food in the Last Year: Never true  Transportation Needs: No Transportation Needs (01/07/2023)   PRAPARE - Administrator, Civil Service (Medical): No    Lack of Transportation (Non-Medical): No  Physical Activity: Sufficiently Active (09/03/2022)   Exercise Vital Sign    Days of Exercise per Week: 5 days    Minutes of Exercise per Session: 30 min  Stress: No Stress Concern Present (09/03/2022)   [employer] of Occupational Health - Occupational Stress Questionnaire    Feeling of Stress : Not at all  Social Connections: Socially Integrated (09/03/2022)   Social Connection and Isolation Panel [NHANES]    Frequency of Communication with Friends and Family: More than three times a week    Frequency of Social Gatherings with Friends and Family: More than three times a week     Attends Religious Services: More than 4 times per year    Active Member of [employer] or Organizations: Yes    Attends Banker Meetings: More than 4 times per year    Marital Status: Married  Catering manager Violence: Not At Risk (01/07/2023)   Humiliation, Afraid, Rape, and Kick questionnaire    Fear of Current or Ex-Partner: No    Emotionally Abused: No    Physically Abused: No    Sexually Abused: No   Family History  Problem Relation Age of Onset   Hypertension Brother  Heart failure Neg Hx    There were no vitals taken for this visit.  Wt Readings from Last 3 Encounters:  05/13/23 94.4 kg (208 lb 3.2 oz)  05/03/23 95.6 kg (210 lb 12.8 oz)  05/03/23 95.5 kg (210 lb 8 oz)   PHYSICAL EXAM: General:  Well appearing. No resp difficulty HEENT: normal Neck: supple. no JVD. Carotids 2+ bilat; no bruits. No lymphadenopathy or thryomegaly appreciated. Cor: PMI nondisplaced. Regular rate & rhythm. No rubs, gallops or murmurs. Lungs: clear Abdomen: soft, nontender, nondistended. No hepatosplenomegaly. No bruits or masses. Good bowel sounds. Extremities: no cyanosis, clubbing, rash, edema Neuro: alert & orientedx3, cranial nerves grossly intact. moves all 4 extremities w/o difficulty. 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 - Echo (10/30/21): EF 25-30% + apical clot. Moderate MR/TR. - Echo 11/23 EF 25-20% RV ok - Echo 5/24 EF 25-30%, LV with RWMA with akinesis of the apex.  Mod concentric LVH. GIDD, large apical thrombus.  RV mildly reduced, trivial MR. - Doing well. NYHA I-II Volume ok  - Continue Entresto 97/103 mg bid. - Continue spiro 12.5 mg daily.  - Continue torsemide 20 mg PRN. Has not needed  recently.  - Continue Jardiance 10 mg daily.  - Continue Toprol XL 50 mg q hs. - Has seen EP. Declines ICD. We discussed again today.  - Labs from earlier this month reviewed and stable   2. CAD - Cath 11/22 severe 2V CAD, mid-distal LAD 100% stenosed, ost-prox RCA 100% stenosed  - cMRI 11/22  EF 21% and larg  inferior and apical scars with minimal viability in the RCA/LAD territories. Large LV clot.  - No options for revascularization, continue medical therapy. - No s/s angina - Continue  atorvastatin 80 + ? blocker. - On Xarelto   3. LV Thrombus  - 2/2 severe LV dysfunction, seen again on echo 5/24 - Continue lifelong AC   4. Bilateral Renal Masses, suspicion for RCC - s/p L partial nephrectomy  01/07/23   5. Hypertension  - Blood pressure well controlled. Continue current regimen.  6. Ascending Thoracic Aortic Aneurysm - 43 mm in diameter on chest CT - Asc Ao 4.0 cm on echo 03/23 stable at 4.1 on echo 11/23 - Asc Ao 3.8 on 5/24 echo - Continue to follow  7. CKD 3a - continue SGLT2i -Recent Scr 1.39. stable  8. PE/DVT - on Xarelto as above - provoked DVT post surgery   Jacklynn Ganong, FNP  2:15 PM

## 2023-10-13 ENCOUNTER — Telehealth (HOSPITAL_COMMUNITY): Payer: Self-pay

## 2023-10-13 NOTE — Telephone Encounter (Signed)
Called and spoke to pt's wife Aram Beecham to confirm/remind patient of their appointment at the [facility] on 10/14/23.   Patient reminded to bring all medications and/or complete list.  Confirmed patient has transportation. Gave directions, instructed to utilize valet parking.  Confirmed appointment prior to ending call.

## 2023-10-14 ENCOUNTER — Ambulatory Visit (HOSPITAL_COMMUNITY)
Admission: RE | Admit: 2023-10-14 | Discharge: 2023-10-14 | Disposition: A | Discharge status: Home / Self Care | Payer: Medicare Other | Source: Ambulatory Visit | Attending: Family Medicine | Admitting: Family Medicine

## 2023-10-14 ENCOUNTER — Encounter (HOSPITAL_COMMUNITY): Payer: Self-pay

## 2023-10-14 VITALS — BP 132/88 | HR 76 | Wt 226.8 lb

## 2023-10-14 DIAGNOSIS — I5082 Biventricular heart failure: Secondary | ICD-10-CM | POA: Insufficient documentation

## 2023-10-14 DIAGNOSIS — I5022 Chronic systolic (congestive) heart failure: Secondary | ICD-10-CM

## 2023-10-14 DIAGNOSIS — I130 Hypertensive heart and chronic kidney disease with heart failure and stage 1 through stage 4 chronic kidney disease, or unspecified chronic kidney disease: Secondary | ICD-10-CM | POA: Diagnosis not present

## 2023-10-14 DIAGNOSIS — I10 Essential (primary) hypertension: Secondary | ICD-10-CM

## 2023-10-14 DIAGNOSIS — I82402 Acute embolism and thrombosis of unspecified deep veins of left lower extremity: Secondary | ICD-10-CM | POA: Diagnosis not present

## 2023-10-14 DIAGNOSIS — Z7901 Long term (current) use of anticoagulants: Secondary | ICD-10-CM | POA: Diagnosis not present

## 2023-10-14 DIAGNOSIS — Z905 Acquired absence of kidney: Secondary | ICD-10-CM | POA: Insufficient documentation

## 2023-10-14 DIAGNOSIS — I255 Ischemic cardiomyopathy: Secondary | ICD-10-CM | POA: Insufficient documentation

## 2023-10-14 DIAGNOSIS — N2889 Other specified disorders of kidney and ureter: Secondary | ICD-10-CM | POA: Diagnosis not present

## 2023-10-14 DIAGNOSIS — N1831 Chronic kidney disease, stage 3a: Secondary | ICD-10-CM | POA: Diagnosis not present

## 2023-10-14 DIAGNOSIS — Z7984 Long term (current) use of oral hypoglycemic drugs: Secondary | ICD-10-CM | POA: Insufficient documentation

## 2023-10-14 DIAGNOSIS — I2510 Atherosclerotic heart disease of native coronary artery without angina pectoris: Secondary | ICD-10-CM | POA: Diagnosis not present

## 2023-10-14 DIAGNOSIS — Z79899 Other long term (current) drug therapy: Secondary | ICD-10-CM | POA: Diagnosis not present

## 2023-10-14 DIAGNOSIS — I513 Intracardiac thrombosis, not elsewhere classified: Secondary | ICD-10-CM | POA: Diagnosis not present

## 2023-10-14 DIAGNOSIS — I7121 Aneurysm of the ascending aorta, without rupture: Secondary | ICD-10-CM

## 2023-10-14 DIAGNOSIS — L905 Scar conditions and fibrosis of skin: Secondary | ICD-10-CM | POA: Diagnosis not present

## 2023-10-14 DIAGNOSIS — I2699 Other pulmonary embolism without acute cor pulmonale: Secondary | ICD-10-CM

## 2023-10-14 LAB — BASIC METABOLIC PANEL
Anion gap: 14 (ref 5–15)
BUN: 12 mg/dL (ref 8–23)
CO2: 25 mmol/L (ref 22–32)
Calcium: 9.5 mg/dL (ref 8.9–10.3)
Chloride: 102 mmol/L (ref 98–111)
Creatinine, Ser: 1.5 mg/dL — ABNORMAL HIGH (ref 0.61–1.24)
GFR, Estimated: 49 mL/min — ABNORMAL LOW (ref >60)
Glucose, Bld: 93 mg/dL (ref 70–99)
Potassium: 4.6 mmol/L (ref 3.5–5.1)
Sodium: 141 mmol/L (ref 135–145)

## 2023-10-14 LAB — LIPID PANEL
Cholesterol: 131 mg/dL (ref 0–200)
HDL: 38 mg/dL — ABNORMAL LOW (ref >40)
LDL Cholesterol: 80 mg/dL (ref 0–99)
Total CHOL/HDL Ratio: 3.4 {ratio}
Triglycerides: 67 mg/dL (ref <150)
VLDL: 13 mg/dL (ref 0–40)

## 2023-10-14 LAB — CBC
HCT: 44.1 % (ref 39.0–52.0)
Hemoglobin: 14.1 g/dL (ref 13.0–17.0)
MCH: 26.8 pg (ref 26.0–34.0)
MCHC: 32 g/dL (ref 30.0–36.0)
MCV: 83.7 fL (ref 80.0–100.0)
Platelets: 179 10*3/uL (ref 150–400)
RBC: 5.27 MIL/uL (ref 4.22–5.81)
RDW: 17.4 % — ABNORMAL HIGH (ref 11.5–15.5)
WBC: 5.1 10*3/uL (ref 4.0–10.5)
nRBC: 0 % (ref 0.0–0.2)

## 2023-10-14 LAB — BRAIN NATRIURETIC PEPTIDE: B Natriuretic Peptide: 445.3 pg/mL — ABNORMAL HIGH (ref 0.0–100.0)

## 2023-10-14 MED ORDER — SPIRONOLACTONE 25 MG PO TABS: 25.0000 mg | ORAL_TABLET | Freq: Every day | ORAL | 11 refills | Status: DC

## 2023-10-14 NOTE — Progress Notes (Signed)
ReDS Vest / Clip - 10/14/23 1300       ReDS Vest / Clip   Station Marker D    Ruler Value 32    ReDS Value Range Low volume    ReDS Actual Value 34

## 2023-10-14 NOTE — Patient Instructions (Addendum)
Thank you for coming in today  If you had labs drawn today, any labs that are abnormal the clinic will call you No news is good news  Medications: Increase Spironolactone to 25 mg 1 tablet daily   Follow up appointments: Your physician recommends that you return for lab work in: 1 week bmet  Your physician recommends that you schedule a follow-up appointment in:  3 months With Dr. Gala Romney with echocardiogram  You will receive a reminder letter in the mail a few months in advance. If you don't receive a letter, please call our office to schedule the follow-up appointment.  Your physician has requested that you have an echocardiogram. Echocardiography is a painless test that uses sound waves to create images of your heart. It provides your doctor with information about the size and shape of your heart and how well your heart's chambers and valves are working. This procedure takes approximately one hour. There are no restrictions for this procedure.      Do the following things EVERYDAY: Weigh yourself in the morning before breakfast. Write it down and keep it in a log. Take your medicines as prescribed Eat low salt foods--Limit salt (sodium) to 2000 mg per day.  Stay as active as you can everyday Limit all fluids for the day to less than 2 liters   At the [facility], you and your health needs are our priority. As part of our continuing mission to provide you with exceptional heart care, we have created designated Provider Care Teams. These Care Teams include your primary Cardiologist (physician) and Advanced Practice Providers (APPs- Physician Assistants and Nurse Practitioners) who all work together to provide you with the care you need, when you need it.   You may see any of the following providers on your designated Care Team at your next follow up: Dr Arvilla Meres Dr Marca Ancona Dr. Marcos Eke, NP Robbie Lis, [place] [facility] [place], [place] Brynda Peon, NP Karle Plumber, PharmD   Please be sure to bring in all your medications bottles to every appointment.    Thank you for choosing [facility]  If you have any questions or concerns before your next appointment please send [place] a message through [place] or call our office at [phone].    TO LEAVE A MESSAGE FOR THE NURSE SELECT OPTION 2, PLEASE LEAVE A MESSAGE INCLUDING: YOUR NAME DATE OF BIRTH CALL BACK NUMBER REASON FOR CALL**this is important as we prioritize the call backs  YOU WILL RECEIVE A CALL BACK THE SAME DAY AS LONG AS YOU CALL BEFORE 4:00 PM

## 2023-10-14 NOTE — Addendum Note (Signed)
Encounter addended by: Faythe Casa, CMA on: 10/14/2023 1:18 PM  Actions taken: Order list changed, Diagnosis association updated, Flowsheet accepted, Clinical Note Signed, Charge Capture section accepted

## 2023-10-21 ENCOUNTER — Other Ambulatory Visit (HOSPITAL_COMMUNITY): Payer: Medicare Other

## 2023-10-21 ENCOUNTER — Ambulatory Visit (HOSPITAL_COMMUNITY)
Admission: RE | Admit: 2023-10-21 | Discharge: 2023-10-21 | Disposition: A | Discharge status: Home / Self Care | Payer: Medicare Other | Source: Ambulatory Visit | Attending: Cardiology | Admitting: Cardiology

## 2023-10-21 DIAGNOSIS — I5022 Chronic systolic (congestive) heart failure: Secondary | ICD-10-CM | POA: Diagnosis present

## 2023-10-21 LAB — BASIC METABOLIC PANEL
Anion gap: 5 (ref 5–15)
BUN: 10 mg/dL (ref 8–23)
CO2: 26 mmol/L (ref 22–32)
Calcium: 8.8 mg/dL — ABNORMAL LOW (ref 8.9–10.3)
Chloride: 110 mmol/L (ref 98–111)
Creatinine, Ser: 1.36 mg/dL — ABNORMAL HIGH (ref 0.61–1.24)
GFR, Estimated: 55 mL/min — ABNORMAL LOW (ref >60)
Glucose, Bld: 95 mg/dL (ref 70–99)
Potassium: 4.8 mmol/L (ref 3.5–5.1)
Sodium: 141 mmol/L (ref 135–145)

## 2023-10-31 ENCOUNTER — Ambulatory Visit: Payer: Medicare Other | Admitting: Emergency Medicine

## 2023-11-03 ENCOUNTER — Ambulatory Visit: Admitting: Emergency Medicine

## 2023-11-07 ENCOUNTER — Encounter: Payer: Self-pay | Admitting: Emergency Medicine

## 2023-11-07 ENCOUNTER — Ambulatory Visit: Admitting: Emergency Medicine

## 2023-11-07 VITALS — BP 120/70 | HR 76 | Temp 97.6°F | Ht 70.0 in | Wt 225.0 lb

## 2023-11-07 DIAGNOSIS — I25118 Atherosclerotic heart disease of native coronary artery with other forms of angina pectoris: Secondary | ICD-10-CM

## 2023-11-07 DIAGNOSIS — E1165 Type 2 diabetes mellitus with hyperglycemia: Secondary | ICD-10-CM

## 2023-11-07 DIAGNOSIS — I5022 Chronic systolic (congestive) heart failure: Secondary | ICD-10-CM

## 2023-11-07 DIAGNOSIS — I513 Intracardiac thrombosis, not elsewhere classified: Secondary | ICD-10-CM

## 2023-11-07 DIAGNOSIS — Z7984 Long term (current) use of oral hypoglycemic drugs: Secondary | ICD-10-CM | POA: Diagnosis not present

## 2023-11-07 DIAGNOSIS — E785 Hyperlipidemia, unspecified: Secondary | ICD-10-CM

## 2023-11-07 DIAGNOSIS — I10 Essential (primary) hypertension: Secondary | ICD-10-CM

## 2023-11-07 LAB — POCT GLYCOSYLATED HEMOGLOBIN (HGB A1C): HbA1c POC (<> result, manual entry): 6.2 % (ref 4.0–5.6)

## 2023-11-07 NOTE — Assessment & Plan Note (Signed)
 Lab Results  Component Value Date   HGBA1C 6.2 11/07/2023  Well-controlled diabetes. Continue Jardiance 10 mg daily

## 2023-11-07 NOTE — Patient Instructions (Signed)
 Health Maintenance After Age 73 After age 4, you are at a higher risk for certain long-term diseases and infections as well as injuries from falls. Falls are a major cause of broken bones and head injuries in people who are older than age 47. Getting regular preventive care can help to keep you healthy and well. Preventive care includes getting regular testing and making lifestyle changes as recommended by your health care provider. Talk with your health care provider about: Which screenings and tests you should have. A screening is a test that checks for a disease when you have no symptoms. A diet and exercise plan that is right for you. What should I know about screenings and tests to prevent falls? Screening and testing are the best ways to find a health problem early. Early diagnosis and treatment give you the best chance of managing medical conditions that are common after age 37. Certain conditions and lifestyle choices may make you more likely to have a fall. Your health care provider may recommend: Regular vision checks. Poor vision and conditions such as cataracts can make you more likely to have a fall. If you wear glasses, make sure to get your prescription updated if your vision changes. Medicine review. Work with your health care provider to regularly review all of the medicines you are taking, including over-the-counter medicines. Ask your health care provider about any side effects that may make you more likely to have a fall. Tell your health care provider if any medicines that you take make you feel dizzy or sleepy. Strength and balance checks. Your health care provider may recommend certain tests to check your strength and balance while standing, walking, or changing positions. Foot health exam. Foot pain and numbness, as well as not wearing proper footwear, can make you more likely to have a fall. Screenings, including: Osteoporosis screening. Osteoporosis is a condition that causes  the bones to get weaker and break more easily. Blood pressure screening. Blood pressure changes and medicines to control blood pressure can make you feel dizzy. Depression screening. You may be more likely to have a fall if you have a fear of falling, feel depressed, or feel unable to do activities that you used to do. Alcohol use screening. Using too much alcohol can affect your balance and may make you more likely to have a fall. Follow these instructions at home: Lifestyle Do not drink alcohol if: Your health care provider tells you not to drink. If you drink alcohol: Limit how much you have to: 0-1 drink a day for women. 0-2 drinks a day for men. Know how much alcohol is in your drink. In the [place] one drink equals one 12 oz bottle of beer (355 mL), one 5 oz glass of wine (148 mL), or one 1 oz glass of hard liquor (44 mL). Do not use any products that contain nicotine or tobacco. These products include cigarettes, chewing tobacco, and vaping devices, such as e-cigarettes. If you need help quitting, ask your health care provider. Activity  Follow a regular exercise program to stay fit. This will help you maintain your balance. Ask your health care provider what types of exercise are appropriate for you. If you need a cane or walker, use it as recommended by your health care provider. Wear supportive shoes that have nonskid soles. Safety  Remove any tripping hazards, such as rugs, cords, and clutter. Install safety equipment such as grab bars in bathrooms and safety rails on stairs. Keep rooms and walkways  well-lit. General instructions Talk with your health care provider about your risks for falling. Tell your health care provider if: You fall. Be sure to tell your health care provider about all falls, even ones that seem minor. You feel dizzy, tiredness (fatigue), or off-balance. Take over-the-counter and prescription medicines only as told by your health care provider. These include  supplements. Eat a healthy diet and maintain a healthy weight. A healthy diet includes low-fat dairy products, low-fat (lean) meats, and fiber from whole grains, beans, and lots of fruits and vegetables. Stay current with your vaccines. Schedule regular health, dental, and eye exams. Summary Having a healthy lifestyle and getting preventive care can help to protect your health and wellness after age 11. Screening and testing are the best way to find a health problem early and help you avoid having a fall. Early diagnosis and treatment give you the best chance for managing medical conditions that are more common for people who are older than age 28. Falls are a major cause of broken bones and head injuries in people who are older than age 48. Take precautions to prevent a fall at home. Work with your health care provider to learn what changes you can make to improve your health and wellness and to prevent falls. This information is not intended to replace advice given to you by your health care provider. Make sure you discuss any questions you have with your health care provider. Document Revised: 01/05/2021 Document Reviewed: 01/05/2021 Elsevier Patient Education  2024 [employer].

## 2023-11-07 NOTE — Progress Notes (Signed)
 Blythe Stanford 73 y.o.   No chief complaint on file.   HISTORY OF PRESENT ILLNESS: This is a 72 y.o. male A1A here for follow-up of multiple chronic medical conditions Overall doing well. Has no complaints or medical concerns today.  HPI   Prior to Admission medications   Medication Sig Start Date End Date Taking? Authorizing Provider  atorvastatin (LIPITOR) 80 MG tablet Take 1 tablet (80 mg total) by mouth daily. 02/24/23  Yes Bensimhon, Bevelyn Buckles, MD  empagliflozin (JARDIANCE) 10 MG TABS tablet Take 1 tablet (10 mg total) by mouth daily. 03/23/23  Yes Bensimhon, Bevelyn Buckles, MD  metoprolol succinate (TOPROL-XL) 50 MG 24 hr tablet TAKE 1 TABLET BY MOUTH EVERYDAY AT BEDTIME 04/13/23  Yes Bensimhon, Bevelyn Buckles, MD  Multiple Vitamin (MULTIVITAMIN WITH MINERALS) TABS tablet Take 1 tablet by mouth daily.   Yes [provider]  rivaroxaban (XARELTO) 20 MG TABS tablet Take 1 tablet (20 mg total) by mouth daily with supper. 05/27/23  Yes Bensimhon, Bevelyn Buckles, MD  sacubitril-valsartan (ENTRESTO) 97-103 MG Take 1 tablet by mouth 2 (two) times daily. 05/03/23  Yes Bensimhon, Bevelyn Buckles, MD  sildenafil (VIAGRA) 100 MG tablet Take 0.5-1 tablets (50-100 mg total) by mouth daily as needed for erectile dysfunction. 03/10/23  Yes Oluwafemi Villella, Eilleen Kempf, MD  spironolactone (ALDACTONE) 25 MG tablet Take 1 tablet (25 mg total) by mouth daily. 10/14/23 01/12/24 Yes Milford, Anderson Malta, FNP  torsemide (DEMADEX) 20 MG tablet Take 1 tablet (20 mg total) by mouth daily as needed (swelling or weight gain). 01/18/23  Yes Zigmund Daniel., MD    No Known Allergies  Patient Active Problem List   Diagnosis Date Noted   Erectile dysfunction due to arterial insufficiency 03/10/2023   Renal cell carcinoma (HCC) 01/14/2023   Type 2 diabetes mellitus (HCC) 01/12/2023   Biventricular heart failure (HCC) 01/12/2023   Paroxysmal atrial fibrillation (HCC) 01/12/2023   Renal mass 01/07/2023   Dyslipidemia 08/31/2022    Coronary artery disease of native artery of native heart with stable angina pectoris (HCC) 02/08/2022   Chronic systolic heart failure (HCC) 08/05/2021   Essential hypertension 07/25/2021   Left ventricular apical thrombus     Past Medical History:  Diagnosis Date   Ascending aortic aneurysm (HCC)    Bilateral renal masses    CAD (coronary artery disease)    CHF (congestive heart failure) (HCC)    HLD (hyperlipidemia)    Hypertension    LV (left ventricular) mural thrombus    Systolic heart failure (HCC)     Past Surgical History:  Procedure Laterality Date   CARDIAC CATHETERIZATION     RIGHT/LEFT HEART CATH AND CORONARY ANGIOGRAPHY N/A 07/27/2021   Procedure: RIGHT/LEFT HEART CATH AND CORONARY ANGIOGRAPHY;  Surgeon: Dolores Patty, MD;  Location: [facility];  Service: Cardiovascular;  Laterality: N/A;   ROBOTIC ASSITED PARTIAL NEPHRECTOMY Left 01/07/2023   Procedure: XI ROBOTIC ASSITED LEFT PARTIAL NEPHRECTOMY;  Surgeon: Crist Fat, MD;  Location: [facility];  Service: Urology;  Laterality: Left;  210 MINUTES    Social History   Socioeconomic History   Marital status: Married    Spouse name: Not on file   Number of children: 2   Years of education: 12   Highest education level: High school graduate  Occupational History   Occupation: Retired  Tobacco Use   Smoking status: Former    Current packs/day: 0.00    Types: Cigarettes    Quit date: 07/27/2021  Years since quitting: 2.2   Smokeless tobacco: Never  Vaping Use   Vaping status: Never Used  Substance and Sexual Activity   Alcohol use: Not Currently   Drug use: Never   Sexual activity: Not on file  Other Topics Concern   Not on file  Social History Narrative   Not on file   Social Drivers of Health   Financial Resource Strain: Low Risk  (09/03/2022)   Overall Financial Resource Strain (CARDIA)    Difficulty of Paying Living Expenses: Not hard at all  Food Insecurity: No Food Insecurity  (01/07/2023)   Hunger Vital Sign    Worried About Running Out of Food in the Last Year: Never true    Ran Out of Food in the Last Year: Never true  Transportation Needs: No Transportation Needs (01/07/2023)   PRAPARE - Administrator, Civil Service (Medical): No    Lack of Transportation (Non-Medical): No  Physical Activity: Sufficiently Active (09/03/2022)   Exercise Vital Sign    Days of Exercise per Week: 5 days    Minutes of Exercise per Session: 30 min  Stress: No Stress Concern Present (09/03/2022)   [employer] of Occupational Health - Occupational Stress Questionnaire    Feeling of Stress : Not at all  Social Connections: Socially Integrated (09/03/2022)   Social Connection and Isolation Panel [NHANES]    Frequency of Communication with Friends and Family: More than three times a week    Frequency of Social Gatherings with Friends and Family: More than three times a week    Attends Religious Services: More than 4 times per year    Active Member of [employer] or Organizations: Yes    Attends Engineer, structural: More than 4 times per year    Marital Status: Married  Catering manager Violence: Not At Risk (01/07/2023)   Humiliation, Afraid, Rape, and Kick questionnaire    Fear of Current or Ex-Partner: No    Emotionally Abused: No    Physically Abused: No    Sexually Abused: No    Family History  Problem Relation Age of Onset   Hypertension Brother    Heart failure Neg Hx      Review of Systems  Constitutional: Negative.  Negative for chills and fever.  HENT: Negative.  Negative for congestion and sore throat.   Respiratory: Negative.  Negative for cough and shortness of breath.   Cardiovascular: Negative.  Negative for chest pain and palpitations.  Gastrointestinal:  Negative for abdominal pain, diarrhea, nausea and vomiting.  Genitourinary: Negative.  Negative for dysuria and hematuria.  Skin: Negative.  Negative for rash.  Neurological: Negative.   Negative for dizziness and headaches.  All other systems reviewed and are negative.   Vitals:   11/07/23 1345  BP: 120/70  Pulse: 76  Temp: 97.6 F (36.4 C)  SpO2: 96%   Wt Readings from Last 3 Encounters:  11/07/23 225 lb (102.1 kg)  10/14/23 226 lb 12.8 oz (102.9 kg)  05/13/23 208 lb 3.2 oz (94.4 kg)     Physical Exam Vitals reviewed.  Constitutional:      Appearance: Normal appearance.  HENT:     Head: Normocephalic.     Mouth/Throat:     Mouth: Mucous membranes are moist.     Pharynx: Oropharynx is clear.  Eyes:     Extraocular Movements: Extraocular movements intact.     Conjunctiva/sclera: Conjunctivae normal.     Pupils: Pupils are equal, round, and reactive to  light.  Cardiovascular:     Rate and Rhythm: Normal rate and regular rhythm.     Pulses: Normal pulses.     Heart sounds: Normal heart sounds.  Pulmonary:     Effort: Pulmonary effort is normal.     Breath sounds: Normal breath sounds.  Musculoskeletal:     Cervical back: No tenderness.  Lymphadenopathy:     Cervical: No cervical adenopathy.  Skin:    General: Skin is warm and dry.     Capillary Refill: Capillary refill takes less than 2 seconds.  Neurological:     General: No focal deficit present.     Mental Status: He is alert and oriented to person, place, and time.  Psychiatric:        Mood and Affect: Mood normal.        Behavior: Behavior normal.      ASSESSMENT & PLAN: A total of 45 minutes was spent with the patient and counseling/coordination of care regarding preparing for this visit, review of most recent office visit notes, review of multiple chronic medical conditions and their management, cardiovascular risks associated with hypertension and diabetes, review of all medications, review of most recent bloodwork results, review of health maintenance items, education on nutrition, prognosis, documentation, and need for follow up. .  Problem List Items Addressed This Visit        Cardiovascular and Mediastinum   Essential hypertension - Primary   BP Readings from Last 3 Encounters:  11/07/23 120/70  10/14/23 132/88  05/13/23 122/84  Well-controlled hypertension Continue metoprolol succinate 50 mg, Aldactone 12.5 mg daily and Entresto 49-51 twice a day Cardiovascular risks associated with hypertension discussed      Left ventricular apical thrombus   Stable.  Continue Xarelto 20 mg daily.      Chronic systolic heart failure (HCC)   Stable.  No findings of acute CHF. Follows up with cardiologist on a regular basis Continues Entresto 49-51 twice a day, Jardiance 10 mg daily, Aldactone 12.5 mg daily, metoprolol succinate 50 mg daily, and torsemide 20 mg daily      Coronary artery disease of native artery of native heart with stable angina pectoris (HCC)   Stable. No anginal episodes         Endocrine   Type 2 diabetes mellitus (HCC)   Lab Results  Component Value Date   HGBA1C 6.2 11/07/2023  Well-controlled diabetes. Continue Jardiance 10 mg daily       Relevant Orders   POCT HgB A1C (Completed)     Other   Dyslipidemia   Stable chronic condition Continues atorvastatin 80 mg daily. Diet and nutrition discussed.      Relevant Orders   POCT HgB A1C (Completed)   Patient Instructions  Health Maintenance After Age 90 After age 73, you are at a higher risk for certain long-term diseases and infections as well as injuries from falls. Falls are a major cause of broken bones and head injuries in people who are older than age 50. Getting regular preventive care can help to keep you healthy and well. Preventive care includes getting regular testing and making lifestyle changes as recommended by your health care provider. Talk with your health care provider about: Which screenings and tests you should have. A screening is a test that checks for a disease when you have no symptoms. A diet and exercise plan that is right for you. What should I know about  screenings and tests to prevent falls? Screening and testing are  the best ways to find a health problem early. Early diagnosis and treatment give you the best chance of managing medical conditions that are common after age 63. Certain conditions and lifestyle choices may make you more likely to have a fall. Your health care provider may recommend: Regular vision checks. Poor vision and conditions such as cataracts can make you more likely to have a fall. If you wear glasses, make sure to get your prescription updated if your vision changes. Medicine review. Work with your health care provider to regularly review all of the medicines you are taking, including over-the-counter medicines. Ask your health care provider about any side effects that may make you more likely to have a fall. Tell your health care provider if any medicines that you take make you feel dizzy or sleepy. Strength and balance checks. Your health care provider may recommend certain tests to check your strength and balance while standing, walking, or changing positions. Foot health exam. Foot pain and numbness, as well as not wearing proper footwear, can make you more likely to have a fall. Screenings, including: Osteoporosis screening. Osteoporosis is a condition that causes the bones to get weaker and break more easily. Blood pressure screening. Blood pressure changes and medicines to control blood pressure can make you feel dizzy. Depression screening. You may be more likely to have a fall if you have a fear of falling, feel depressed, or feel unable to do activities that you used to do. Alcohol use screening. Using too much alcohol can affect your balance and may make you more likely to have a fall. Follow these instructions at home: Lifestyle Do not drink alcohol if: Your health care provider tells you not to drink. If you drink alcohol: Limit how much you have to: 0-1 drink a day for women. 0-2 drinks a day for men. Know how  much alcohol is in your drink. In the [place] one drink equals one 12 oz bottle of beer (355 mL), one 5 oz glass of wine (148 mL), or one 1 oz glass of hard liquor (44 mL). Do not use any products that contain nicotine or tobacco. These products include cigarettes, chewing tobacco, and vaping devices, such as e-cigarettes. If you need help quitting, ask your health care provider. Activity  Follow a regular exercise program to stay fit. This will help you maintain your balance. Ask your health care provider what types of exercise are appropriate for you. If you need a cane or walker, use it as recommended by your health care provider. Wear supportive shoes that have nonskid soles. Safety  Remove any tripping hazards, such as rugs, cords, and clutter. Install safety equipment such as grab bars in bathrooms and safety rails on stairs. Keep rooms and walkways well-lit. General instructions Talk with your health care provider about your risks for falling. Tell your health care provider if: You fall. Be sure to tell your health care provider about all falls, even ones that seem minor. You feel dizzy, tiredness (fatigue), or off-balance. Take over-the-counter and prescription medicines only as told by your health care provider. These include supplements. Eat a healthy diet and maintain a healthy weight. A healthy diet includes low-fat dairy products, low-fat (lean) meats, and fiber from whole grains, beans, and lots of fruits and vegetables. Stay current with your vaccines. Schedule regular health, dental, and eye exams. Summary Having a healthy lifestyle and getting preventive care can help to protect your health and wellness after age 43. Screening and testing are  the best way to find a health problem early and help you avoid having a fall. Early diagnosis and treatment give you the best chance for managing medical conditions that are more common for people who are older than age 64. Falls are a  major cause of broken bones and head injuries in people who are older than age 45. Take precautions to prevent a fall at home. Work with your health care provider to learn what changes you can make to improve your health and wellness and to prevent falls. This information is not intended to replace advice given to you by your health care provider. Make sure you discuss any questions you have with your health care provider. Document Revised: 01/05/2021 Document Reviewed: 01/05/2021 Elsevier Patient Education  2024 Elsevier Inc.    Edwina Barth, MD [facility]

## 2023-11-07 NOTE — Assessment & Plan Note (Signed)
 BP Readings from Last 3 Encounters:  11/07/23 120/70  10/14/23 132/88  05/13/23 122/84  Well-controlled hypertension Continue metoprolol succinate 50 mg, Aldactone 12.5 mg daily and Entresto 49-51 twice a day Cardiovascular risks associated with hypertension discussed

## 2023-11-07 NOTE — Assessment & Plan Note (Signed)
Stable chronic condition Continues atorvastatin 80 mg daily. Diet and nutrition discussed.

## 2023-11-07 NOTE — Assessment & Plan Note (Signed)
Stable.  No anginal episodes. 

## 2023-11-07 NOTE — Assessment & Plan Note (Signed)
Stable.  Continue Xarelto 20mg daily

## 2023-11-07 NOTE — Assessment & Plan Note (Signed)
 Stable.  No findings of acute CHF. Follows up with cardiologist on a regular basis Continues Entresto 49-51 twice a day, Jardiance 10 mg daily, Aldactone 12.5 mg daily, metoprolol succinate 50 mg daily, and torsemide 20 mg daily

## 2023-12-23 ENCOUNTER — Ambulatory Visit

## 2023-12-23 VITALS — Ht 70.0 in | Wt 225.0 lb

## 2023-12-23 DIAGNOSIS — Z0000 Encounter for general adult medical examination without abnormal findings: Secondary | ICD-10-CM | POA: Diagnosis not present

## 2023-12-23 DIAGNOSIS — Z1159 Encounter for screening for other viral diseases: Secondary | ICD-10-CM

## 2023-12-23 NOTE — Progress Notes (Signed)
 Subjective:  Please attest and cosign this visit due to patients primary care provider not being in the office at the time the visit was completed.     Troy May is a 73 y.o. who presents for a Medicare Wellness preventive visit.  Visit Complete: Virtual I connected with  Troy May on 12/23/23 by a audio enabled telemedicine application and verified that I am speaking with the correct person using two identifiers.  Patient Location: Home  Provider Location: Office/Clinic  I discussed the limitations of evaluation and management by telemedicine. The patient expressed understanding and agreed to proceed.  Vital Signs: Because this visit was a virtual/telehealth visit, some criteria may be missing or patient reported. Any vitals not documented were not able to be obtained and vitals that have been documented are patient reported.  VideoDeclined- This patient declined Librarian, academic. Therefore the visit was completed with audio only.  Persons Participating in Visit: Patient.  AWV Questionnaire: No: Patient Medicare AWV questionnaire was not completed prior to this visit.  Cardiac Risk Factors include: advanced age (>60men, >33 women);dyslipidemia;diabetes mellitus;male gender;hypertension     Objective:    Today's Vitals   12/23/23 1054  Weight: 225 lb (102.1 kg)  Height: 5\' 10"  (1.778 m)   Body mass index is 32.28 kg/m.     12/23/2023   10:52 AM 01/07/2023    6:02 AM 12/31/2022   10:15 AM 09/03/2022    9:18 AM 07/25/2021    2:27 PM 07/24/2021    7:28 PM  Advanced Directives  Does Patient Have a Medical Advance Directive? No No No No  No  Would patient like information on creating a medical advance directive? Yes (MAU/Ambulatory/Procedural Areas - Information given) No - Patient declined  No - Patient declined No - Patient declined     Current Medications (verified) Outpatient Encounter Medications as of 12/23/2023  Medication Sig    atorvastatin  (LIPITOR ) 80 MG tablet Take 1 tablet (80 mg total) by mouth daily.   empagliflozin  (JARDIANCE ) 10 MG TABS tablet Take 1 tablet (10 mg total) by mouth daily.   metoprolol  succinate (TOPROL -XL) 50 MG 24 hr tablet TAKE 1 TABLET BY MOUTH EVERYDAY AT BEDTIME   Multiple Vitamin (MULTIVITAMIN WITH MINERALS) TABS tablet Take 1 tablet by mouth daily.   rivaroxaban  (XARELTO ) 20 MG TABS tablet Take 1 tablet (20 mg total) by mouth daily with supper.   sacubitril -valsartan  (ENTRESTO ) 97-103 MG Take 1 tablet by mouth 2 (two) times daily.   sildenafil  (VIAGRA ) 100 MG tablet Take 0.5-1 tablets (50-100 mg total) by mouth daily as needed for erectile dysfunction.   spironolactone  (ALDACTONE ) 25 MG tablet Take 1 tablet (25 mg total) by mouth daily.   torsemide  (DEMADEX ) 20 MG tablet Take 1 tablet (20 mg total) by mouth daily as needed (swelling or weight gain).   No facility-administered encounter medications on file as of 12/23/2023.    Allergies (verified) Patient has no known allergies.   History: Past Medical History:  Diagnosis Date   Ascending aortic aneurysm (HCC)    Bilateral renal masses    CAD (coronary artery disease)    CHF (congestive heart failure) (HCC)    HLD (hyperlipidemia)    Hypertension    LV (left ventricular) mural thrombus    Systolic heart failure (HCC)    Past Surgical History:  Procedure Laterality Date   CARDIAC CATHETERIZATION     RIGHT/LEFT HEART CATH AND CORONARY ANGIOGRAPHY N/A 07/27/2021   Procedure: RIGHT/LEFT HEART CATH AND  CORONARY ANGIOGRAPHY;  Surgeon: Mardell Shade, MD;  Location: [facility];  Service: Cardiovascular;  Laterality: N/A;   ROBOTIC ASSITED PARTIAL NEPHRECTOMY Left 01/07/2023   Procedure: XI ROBOTIC ASSITED LEFT PARTIAL NEPHRECTOMY;  Surgeon: Andrez Banker, MD;  Location: [facility];  Service: Urology;  Laterality: Left;  210 MINUTES   Family History  Problem Relation Age of Onset   Hypertension Brother    Heart  failure Neg Hx    Social History   Socioeconomic History   Marital status: Married    Spouse name: Not on file   Number of children: 2   Years of education: 12   Highest education level: High school graduate  Occupational History   Occupation: Retired  Tobacco Use   Smoking status: Former    Current packs/day: 0.00    Types: Cigarettes    Quit date: 07/27/2021    Years since quitting: 2.4    Passive exposure: Past   Smokeless tobacco: Never  Vaping Use   Vaping status: Never Used  Substance and Sexual Activity   Alcohol use: Not Currently   Drug use: Never   Sexual activity: Not on file  Other Topics Concern   Not on file  Social History Narrative   Married   Social Drivers of Health   Financial Resource Strain: Low Risk  (12/23/2023)   Overall Financial Resource Strain (CARDIA)    Difficulty of Paying Living Expenses: Not hard at all  Food Insecurity: No Food Insecurity (12/23/2023)   Hunger Vital Sign    Worried About Running Out of Food in the Last Year: Never true    Ran Out of Food in the Last Year: Never true  Transportation Needs: No Transportation Needs (12/23/2023)   PRAPARE - Administrator, Civil Service (Medical): No    Lack of Transportation (Non-Medical): No  Physical Activity: Insufficiently Active (12/23/2023)   Exercise Vital Sign    Days of Exercise per Week: 1 day    Minutes of Exercise per Session: 20 min  Stress: No Stress Concern Present (12/23/2023)   [employer] of Occupational Health - Occupational Stress Questionnaire    Feeling of Stress : Not at all  Social Connections: Moderately Integrated (12/23/2023)   Social Connection and Isolation Panel [NHANES]    Frequency of Communication with Friends and Family: More than three times a week    Frequency of Social Gatherings with Friends and Family: More than three times a week    Attends Religious Services: 1 to 4 times per year    Active Member of [employer] or Organizations: No     Attends Engineer, structural: Never    Marital Status: Married    Tobacco Counseling Counseling given: No    Clinical Intake:  Pre-visit preparation completed: Yes  Pain : No/denies pain     BMI - recorded: 32.28 Nutritional Risks: None Diabetes: Yes (pt is stating that he is not a Diabetic) CBG done?: No Did pt. bring in CBG monitor from home?: No  Lab Results  Component Value Date   HGBA1C 6.2 11/07/2023   HGBA1C 6.0 (H) 12/31/2022     How often do you need to have someone help you when you read instructions, pamphlets, or other written materials from your doctor or pharmacy?: 1 - Never  Interpreter Needed?: No  Information entered by :: Kandy Orris, CMA   Activities of Daily Living     12/23/2023   10:58 AM 01/07/2023  3:00 PM  In your present state of health, do you have any difficulty performing the following activities:  Hearing? 0   Vision? 0   Difficulty concentrating or making decisions? 0   Walking or climbing stairs? 0   Dressing or bathing? 0   Doing errands, shopping? 0 0  Preparing Food and eating ? N   Using the Toilet? N   In the past six months, have you accidently leaked urine? N   Do you have problems with loss of bowel control? N   Managing your Medications? N   Managing your Finances? N   Housekeeping or managing your Housekeeping? N     Patient Care Team: Elvira Hammersmith, MD as PCP - General (Internal Medicine) Bensimhon, Rheta Celestine, MD as PCP - Advanced Heart Failure (Cardiology)  Indicate any recent Medical Services you may have received from other than [facility] providers in the past year (date may be approximate).     Assessment:   This is a routine wellness examination for [place].  Hearing/Vision screen Hearing Screening - Comments:: Denies hearing difficulties   Vision Screening - Comments:: Declines vision concerns - exam done at the [facility] in [place], [place]   Goals Addressed               This  Visit's Progress     Patient Stated (pt-stated)        Patient stated he plans to eat healthier and lessen some medications he's on.       Depression Screen     12/23/2023   11:11 AM 11/07/2023    1:44 PM 05/03/2023   10:44 AM 03/10/2023    1:39 PM 01/27/2023    3:21 PM 09/03/2022    9:19 AM 08/31/2022    1:44 PM  PHQ 2/9 Scores  PHQ - 2 Score 0 0 0 0 0 0 0  PHQ- 9 Score 0  0        Fall Risk     11/07/2023    1:44 PM 03/10/2023    1:39 PM 01/27/2023    3:21 PM 09/03/2022    9:19 AM 08/31/2022    1:44 PM  Fall Risk   Falls in the past year? 0 0 0 0 0  Number falls in past yr: 0 0 0 0 0  Injury with Fall? 0 0 0 0 0  Risk for fall due to : No Fall Risks No Fall Risks No Fall Risks No Fall Risks No Fall Risks  Follow up Falls evaluation completed Falls evaluation completed  Falls prevention discussed Falls evaluation completed    MEDICARE RISK AT HOME:  Medicare Risk at Home Any stairs in or around the home?: No If so, are there any without handrails?: No Home free of loose throw rugs in walkways, pet beds, electrical cords, etc?: Yes Adequate lighting in your home to reduce risk of falls?: Yes Life alert?: No Use of a cane, walker or w/c?: No Grab bars in the bathroom?: No Shower chair or bench in shower?: No Elevated toilet seat or a handicapped toilet?: No  TIMED UP AND GO:  Was the test performed?  No  Cognitive Function: 6CIT completed        12/23/2023   11:01 AM 09/03/2022    9:19 AM  6CIT Screen  What Year? 0 points 0 points  What month? 0 points 0 points  What time? 0 points 0 points  Count back from 20 0 points 0 points  Months in reverse 0 points 0 points  Repeat phrase 10 points 0 points  Total Score 10 points 0 points    Immunizations Immunization History  Administered Date(s) Administered   Fluad Quad(high Dose 65+) 04/26/2023   Moderna Covid-19 Fall Seasonal Vaccine 34yrs & older 11/25/2023   PFIZER(Purple Top)SARS-COV-2 Vaccination 09/25/2019,  10/16/2019, 06/06/2020   PNEUMOCOCCAL CONJUGATE-20 04/22/2023   Rsv, Bivalent, Protein Subunit Rsvpref,pf Pattricia Bores) 10/08/2022   Tdap 04/22/2023   Zoster Recombinant(Shingrix) 04/09/2022, 06/25/2022    Screening Tests Health Maintenance  Topic Date Due   FOOT EXAM  Never done   Diabetic kidney evaluation - Urine ACR  Never done   Hepatitis C Screening  Never done   Colonoscopy  Never done   INFLUENZA VACCINE  03/30/2024   HEMOGLOBIN A1C  05/09/2024   COVID-19 Vaccine (5 - Pfizer risk 2024-25 season) 05/27/2024   Diabetic kidney evaluation - eGFR measurement  10/20/2024   OPHTHALMOLOGY EXAM  12/15/2024   Medicare Annual Wellness (AWV)  12/22/2024   Pneumonia Vaccine 90+ Years old  Completed   Zoster Vaccines- Shingrix  Completed   HPV VACCINES  Aged Out   Meningococcal B Vaccine  Aged Out   DTaP/Tdap/Td  Discontinued    Health Maintenance  Health Maintenance Due  Topic Date Due   FOOT EXAM  Never done   Diabetic kidney evaluation - Urine ACR  Never done   Hepatitis C Screening  Never done   Colonoscopy  Never done   Health Maintenance Items Addressed: Hepatitis C Screening; Patient will obtain last Colonoscopy report from the [facility] in [place], [place].  Additional Screening:  Vision Screening: Recommended annual ophthalmology exams for early detection of glaucoma and other disorders of the eye.  Patient stated has routine eye exams at the [facility] in [place], [place] and had last exam in 11/2023.  Dental Screening: Recommended annual dental exams for proper oral hygiene  Community Resource Referral / Chronic Care Management: CRR required this visit?  No   CCM required this visit?  No     Plan:     I have personally reviewed and noted the following in the patient's chart:   Medical and social history Use of alcohol, tobacco or illicit drugs  Current medications and supplements including opioid prescriptions. Patient is not currently taking opioid  prescriptions. Functional ability and status Nutritional status Physical activity Advanced directives List of other physicians Hospitalizations, surgeries, and ER visits in previous 12 months Vitals Screenings to include cognitive, depression, and falls Referrals and appointments  In addition, I have reviewed and discussed with patient certain preventive protocols, quality metrics, and best practice recommendations. A written personalized care plan for preventive services as well as general preventive health recommendations were provided to patient.     Patria Bookbinder, CMA   12/23/2023   After Visit Summary: (MyChart) Due to this being a telephonic visit, the after visit summary with patients personalized plan was offered to patient via MyChart   Notes: Patient/Spouse will obtain Colonoscopy/labs from the [facility] [place], [place]).

## 2023-12-23 NOTE — Patient Instructions (Addendum)
 Troy May , Thank you for taking time to come for your Medicare Wellness Visit. I appreciate your ongoing commitment to your health goals. Please review the following plan we discussed and let me know if I can assist you in the future.   Referrals/Orders/Follow-Ups/Clinician Recommendations: Aim for 30 minutes of exercise or brisk walking, 6-8 glasses of water , and 5 servings of fruits and vegetables each day. Ordered a Hepatitis C Screening for 2025.  Patient/Spouse will obtain Colonoscopy/labs from the [facility] [place], [place]).     This is a list of the screening recommended for you and due dates:  Health Maintenance  Topic Date Due   Complete foot exam   Never done   Yearly kidney health urinalysis for diabetes  Never done   Hepatitis C Screening  Never done   Colon Cancer Screening  Never done   Flu Shot  03/30/2024   Hemoglobin A1C  05/09/2024   COVID-19 Vaccine (5 - Pfizer risk 2024-25 season) 05/27/2024   Yearly kidney function blood test for diabetes  10/20/2024   Eye exam for diabetics  12/15/2024   Medicare Annual Wellness Visit  12/22/2024   Pneumonia Vaccine  Completed   Zoster (Shingles) Vaccine  Completed   HPV Vaccine  Aged Out   Meningitis B Vaccine  Aged Out   DTaP/Tdap/Td vaccine  Discontinued    Advanced directives: (Provided) Advance directive discussed with you today. I have provided a copy for you to complete at home and have notarized. Once this is complete, please bring a copy in to our office so we can scan it into your chart.   Next Medicare Annual Wellness Visit scheduled for next year: Yes

## 2023-12-26 NOTE — Progress Notes (Signed)
 Subjective:  Please attest and cosign this visit due to patients primary care provider not being in the office at the time the visit was completed.     Troy May is a 73 y.o. who presents for a Medicare Wellness preventive visit.  Visit Complete: Virtual I connected with  Troy May on 12/26/23 by a audio enabled telemedicine application and verified that I am speaking with the correct person using two identifiers.  Patient Location: Home  Provider Location: Office/Clinic  I discussed the limitations of evaluation and management by telemedicine. The patient expressed understanding and agreed to proceed.  Vital Signs: Because this visit was a virtual/telehealth visit, some criteria may be missing or patient reported. Any vitals not documented were not able to be obtained and vitals that have been documented are patient reported.  VideoDeclined- This patient declined Librarian, academic. Therefore the visit was completed with audio only.  Persons Participating in Visit: Patient.  AWV Questionnaire: No: Patient Medicare AWV questionnaire was not completed prior to this visit.  Cardiac Risk Factors include: advanced age (>25men, >75 women);dyslipidemia;diabetes mellitus;male gender;hypertension     Objective:    Today's Vitals   12/23/23 1054  Weight: 225 lb (102.1 kg)  Height: 5\' 10"  (1.778 m)   Body mass index is 32.28 kg/m.     12/23/2023   10:52 AM 01/07/2023    6:02 AM 12/31/2022   10:15 AM 09/03/2022    9:18 AM 07/25/2021    2:27 PM 07/24/2021    7:28 PM  Advanced Directives  Does Patient Have a Medical Advance Directive? No No No No  No  Would patient like information on creating a medical advance directive? Yes (MAU/Ambulatory/Procedural Areas - Information given) No - Patient declined  No - Patient declined No - Patient declined     Current Medications (verified) Outpatient Encounter Medications as of 12/23/2023  Medication Sig    atorvastatin  (LIPITOR ) 80 MG tablet Take 1 tablet (80 mg total) by mouth daily.   empagliflozin  (JARDIANCE ) 10 MG TABS tablet Take 1 tablet (10 mg total) by mouth daily.   metoprolol  succinate (TOPROL -XL) 50 MG 24 hr tablet TAKE 1 TABLET BY MOUTH EVERYDAY AT BEDTIME   Multiple Vitamin (MULTIVITAMIN WITH MINERALS) TABS tablet Take 1 tablet by mouth daily.   rivaroxaban  (XARELTO ) 20 MG TABS tablet Take 1 tablet (20 mg total) by mouth daily with supper.   sacubitril -valsartan  (ENTRESTO ) 97-103 MG Take 1 tablet by mouth 2 (two) times daily.   sildenafil  (VIAGRA ) 100 MG tablet Take 0.5-1 tablets (50-100 mg total) by mouth daily as needed for erectile dysfunction.   spironolactone  (ALDACTONE ) 25 MG tablet Take 1 tablet (25 mg total) by mouth daily.   torsemide  (DEMADEX ) 20 MG tablet Take 1 tablet (20 mg total) by mouth daily as needed (swelling or weight gain).   No facility-administered encounter medications on file as of 12/23/2023.    Allergies (verified) Patient has no known allergies.   History: Past Medical History:  Diagnosis Date   Ascending aortic aneurysm (HCC)    Bilateral renal masses    CAD (coronary artery disease)    CHF (congestive heart failure) (HCC)    HLD (hyperlipidemia)    Hypertension    LV (left ventricular) mural thrombus    Systolic heart failure (HCC)    Past Surgical History:  Procedure Laterality Date   CARDIAC CATHETERIZATION     RIGHT/LEFT HEART CATH AND CORONARY ANGIOGRAPHY N/A 07/27/2021   Procedure: RIGHT/LEFT HEART CATH AND  CORONARY ANGIOGRAPHY;  Surgeon: Mardell Shade, MD;  Location: [facility];  Service: Cardiovascular;  Laterality: N/A;   ROBOTIC ASSITED PARTIAL NEPHRECTOMY Left 01/07/2023   Procedure: XI ROBOTIC ASSITED LEFT PARTIAL NEPHRECTOMY;  Surgeon: Andrez Banker, MD;  Location: [facility];  Service: Urology;  Laterality: Left;  210 MINUTES   Family History  Problem Relation Age of Onset   Hypertension Brother    Heart  failure Neg Hx    Social History   Socioeconomic History   Marital status: Married    Spouse name: Not on file   Number of children: 2   Years of education: 12   Highest education level: High school graduate  Occupational History   Occupation: Retired  Tobacco Use   Smoking status: Former    Current packs/day: 0.00    Types: Cigarettes    Quit date: 07/27/2021    Years since quitting: 2.4    Passive exposure: Past   Smokeless tobacco: Never  Vaping Use   Vaping status: Never Used  Substance and Sexual Activity   Alcohol use: Not Currently   Drug use: Never   Sexual activity: Not on file  Other Topics Concern   Not on file  Social History Narrative   Married   Social Drivers of Health   Financial Resource Strain: Low Risk  (12/23/2023)   Overall Financial Resource Strain (CARDIA)    Difficulty of Paying Living Expenses: Not hard at all  Food Insecurity: No Food Insecurity (12/23/2023)   Hunger Vital Sign    Worried About Running Out of Food in the Last Year: Never true    Ran Out of Food in the Last Year: Never true  Transportation Needs: No Transportation Needs (12/23/2023)   PRAPARE - Administrator, Civil Service (Medical): No    Lack of Transportation (Non-Medical): No  Physical Activity: Insufficiently Active (12/23/2023)   Exercise Vital Sign    Days of Exercise per Week: 1 day    Minutes of Exercise per Session: 20 min  Stress: No Stress Concern Present (12/23/2023)   [employer] of Occupational Health - Occupational Stress Questionnaire    Feeling of Stress : Not at all  Social Connections: Moderately Integrated (12/23/2023)   Social Connection and Isolation Panel [NHANES]    Frequency of Communication with Friends and Family: More than three times a week    Frequency of Social Gatherings with Friends and Family: More than three times a week    Attends Religious Services: 1 to 4 times per year    Active Member of [employer] or Organizations: No     Attends Engineer, structural: Never    Marital Status: Married    Tobacco Counseling Counseling given: No    Clinical Intake:  Pre-visit preparation completed: Yes  Pain : No/denies pain     BMI - recorded: 32.28 Nutritional Risks: None Diabetes: Yes (pt is stating that he is not a Diabetic) CBG done?: No Did pt. bring in CBG monitor from home?: No  Lab Results  Component Value Date   HGBA1C 6.2 11/07/2023   HGBA1C 6.0 (H) 12/31/2022     How often do you need to have someone help you when you read instructions, pamphlets, or other written materials from your doctor or pharmacy?: 1 - Never  Interpreter Needed?: No  Information entered by :: Kandy Orris, CMA   Activities of Daily Living     12/23/2023   10:58 AM 01/07/2023  3:00 PM  In your present state of health, do you have any difficulty performing the following activities:  Hearing? 0   Vision? 0   Difficulty concentrating or making decisions? 0   Walking or climbing stairs? 0   Dressing or bathing? 0   Doing errands, shopping? 0 0  Preparing Food and eating ? N   Using the Toilet? N   In the past six months, have you accidently leaked urine? N   Do you have problems with loss of bowel control? N   Managing your Medications? N   Managing your Finances? N   Housekeeping or managing your Housekeeping? N     Patient Care Team: Elvira Hammersmith, MD as PCP - General (Internal Medicine) Bensimhon, Rheta Celestine, MD as PCP - Advanced Heart Failure (Cardiology)  Indicate any recent Medical Services you may have received from other than [facility] providers in the past year (date may be approximate).     Assessment:   This is a routine wellness examination for [place].  Hearing/Vision screen Hearing Screening - Comments:: Denies hearing difficulties   Vision Screening - Comments:: Declines vision concerns - exam done at the [facility] in [place], [place]   Goals Addressed               This  Visit's Progress     Patient Stated (pt-stated)        Patient stated he plans to eat healthier and lessen some medications he's on.       Depression Screen     12/23/2023   11:11 AM 11/07/2023    1:44 PM 05/03/2023   10:44 AM 03/10/2023    1:39 PM 01/27/2023    3:21 PM 09/03/2022    9:19 AM 08/31/2022    1:44 PM  PHQ 2/9 Scores  PHQ - 2 Score 0 0 0 0 0 0 0  PHQ- 9 Score 0  0        Fall Risk     12/23/2023    8:32 AM 11/07/2023    1:44 PM 03/10/2023    1:39 PM 01/27/2023    3:21 PM 09/03/2022    9:19 AM  Fall Risk   Falls in the past year? 0 0 0 0 0  Number falls in past yr: 0 0 0 0 0  Injury with Fall? 0 0 0 0 0  Risk for fall due to : No Fall Risks No Fall Risks No Fall Risks No Fall Risks No Fall Risks  Follow up Falls prevention discussed;Falls evaluation completed Falls evaluation completed Falls evaluation completed  Falls prevention discussed    MEDICARE RISK AT HOME:  Medicare Risk at Home Any stairs in or around the home?: No If so, are there any without handrails?: No Home free of loose throw rugs in walkways, pet beds, electrical cords, etc?: Yes Adequate lighting in your home to reduce risk of falls?: Yes Life alert?: No Use of a cane, walker or w/c?: No Grab bars in the bathroom?: No Shower chair or bench in shower?: No Elevated toilet seat or a handicapped toilet?: No  TIMED UP AND GO:  Was the test performed?  No  Cognitive Function: 6CIT completed        12/23/2023   11:01 AM 09/03/2022    9:19 AM  6CIT Screen  What Year? 0 points 0 points  What month? 0 points 0 points  What time? 0 points 0 points  Count back from 20 0 points 0  points  Months in reverse 0 points 0 points  Repeat phrase 10 points 0 points  Total Score 10 points 0 points    Immunizations Immunization History  Administered Date(s) Administered   Fluad Quad(high Dose 65+) 04/26/2023   Moderna Covid-19 Fall Seasonal Vaccine 30yrs & older 11/25/2023   PFIZER(Purple Top)SARS-COV-2  Vaccination 09/25/2019, 10/16/2019, 06/06/2020   PNEUMOCOCCAL CONJUGATE-20 04/22/2023   Rsv, Bivalent, Protein Subunit Rsvpref,pf Pattricia Bores) 10/08/2022   Tdap 04/22/2023   Zoster Recombinant(Shingrix) 04/09/2022, 06/25/2022    Screening Tests Health Maintenance  Topic Date Due   FOOT EXAM  Never done   Diabetic kidney evaluation - Urine ACR  Never done   Hepatitis C Screening  Never done   Colonoscopy  Never done   INFLUENZA VACCINE  03/30/2024   HEMOGLOBIN A1C  05/09/2024   COVID-19 Vaccine (5 - Pfizer risk 2024-25 season) 05/27/2024   Diabetic kidney evaluation - eGFR measurement  10/20/2024   OPHTHALMOLOGY EXAM  12/15/2024   Medicare Annual Wellness (AWV)  12/22/2024   Pneumonia Vaccine 65+ Years old  Completed   Zoster Vaccines- Shingrix  Completed   HPV VACCINES  Aged Out   Meningococcal B Vaccine  Aged Out   DTaP/Tdap/Td  Discontinued    Health Maintenance  Health Maintenance Due  Topic Date Due   FOOT EXAM  Never done   Diabetic kidney evaluation - Urine ACR  Never done   Hepatitis C Screening  Never done   Colonoscopy  Never done   Health Maintenance Items Addressed: Hepatitis C Screening; Patient will obtain last Colonoscopy report from the [facility] in [place], [place].  Additional Screening:  Vision Screening: Recommended annual ophthalmology exams for early detection of glaucoma and other disorders of the eye.  Patient stated has routine eye exams at the [facility] in [place], [place] and had last exam in 11/2023.  Dental Screening: Recommended annual dental exams for proper oral hygiene  Community Resource Referral / Chronic Care Management: CRR required this visit?  No   CCM required this visit?  No     Plan:     I have personally reviewed and noted the following in the patient's chart:   Medical and social history Use of alcohol, tobacco or illicit drugs  Current medications and supplements including opioid prescriptions. Patient is not  currently taking opioid prescriptions. Functional ability and status Nutritional status Physical activity Advanced directives List of other physicians Hospitalizations, surgeries, and ER visits in previous 12 months Vitals Screenings to include cognitive, depression, and falls Referrals and appointments  In addition, I have reviewed and discussed with patient certain preventive protocols, quality metrics, and best practice recommendations. A written personalized care plan for preventive services as well as general preventive health recommendations were provided to patient.     Patria Bookbinder, CMA   12/26/2023   After Visit Summary: (MyChart) Due to this being a telephonic visit, the after visit summary with patients personalized plan was offered to patient via MyChart   Notes: Patient/Spouse will obtain Colonoscopy/labs from the [facility] [place], [place]).

## 2024-01-06 ENCOUNTER — Telehealth (HOSPITAL_COMMUNITY): Payer: Self-pay

## 2024-01-06 NOTE — Telephone Encounter (Signed)
 Advanced Heart Failure Patient Advocate Encounter  Returned call from pt spouse regarding [employer] grant. Current grant expires on 05/19, informed pt spouse that I will renew the grant and provide the new billing information to the pharmacy when due.  Kennis Peacock, CPhT Rx Patient Advocate Phone: [phone]

## 2024-01-16 ENCOUNTER — Telehealth (HOSPITAL_COMMUNITY): Payer: Self-pay

## 2024-01-16 NOTE — Telephone Encounter (Signed)
 Troy May renewed in separate encounter

## 2024-01-16 NOTE — Telephone Encounter (Signed)
 Advanced Heart Failure Patient Advocate Encounter  The patient was approved for a [employer] grant that will help cover the cost of Entresto , Jardiance , Metoprolol , Spironolactone .  Total amount awarded, $4,500.  Effective: 0/20/2025 - 01/15/2025.  BIN [id number] PCN [id number] [id number] ID [id number]  Pharmacy provided with approval and processing information. Patient spouse informed via phone.  Kennis Peacock, CPhT Rx Patient Advocate Phone: [phone]

## 2024-01-25 ENCOUNTER — Other Ambulatory Visit (HOSPITAL_COMMUNITY): Payer: Self-pay | Admitting: Internal Medicine

## 2024-01-25 DIAGNOSIS — I5082 Biventricular heart failure: Secondary | ICD-10-CM

## 2024-03-24 ENCOUNTER — Other Ambulatory Visit (HOSPITAL_COMMUNITY): Payer: Self-pay | Admitting: Internal Medicine

## 2024-03-24 DIAGNOSIS — I5082 Biventricular heart failure: Secondary | ICD-10-CM

## 2024-04-28 ENCOUNTER — Other Ambulatory Visit (HOSPITAL_COMMUNITY): Payer: Self-pay | Admitting: Internal Medicine

## 2024-04-28 DIAGNOSIS — I5082 Biventricular heart failure: Secondary | ICD-10-CM

## 2024-05-09 ENCOUNTER — Ambulatory Visit: Admitting: Emergency Medicine

## 2024-05-09 ENCOUNTER — Encounter: Payer: Self-pay | Admitting: Emergency Medicine

## 2024-05-12 ENCOUNTER — Other Ambulatory Visit (HOSPITAL_COMMUNITY): Payer: Self-pay | Admitting: Internal Medicine

## 2024-07-13 ENCOUNTER — Encounter (HOSPITAL_COMMUNITY): Admitting: Internal Medicine

## 2024-07-13 ENCOUNTER — Other Ambulatory Visit (HOSPITAL_COMMUNITY)

## 2024-08-16 ENCOUNTER — Telehealth (HOSPITAL_COMMUNITY): Payer: Self-pay | Admitting: Internal Medicine

## 2024-08-16 NOTE — Telephone Encounter (Signed)
 Called to confirm/remind patient of their appointment at the [facility] on 08/16/2024.   Appointment:   [x] Confirmed  [] Left mess   [] No answer/No voice mail  [] VM Full/unable to leave message  [] Phone not in service  Patient reminded to bring all medications and/or complete list.  Confirmed patient has transportation. Gave directions, instructed to utilize valet parking.

## 2024-08-17 ENCOUNTER — Ambulatory Visit (HOSPITAL_COMMUNITY)
Admission: RE | Admit: 2024-08-17 | Discharge: 2024-08-17 | Disposition: A | Discharge status: Home / Self Care | Source: Ambulatory Visit | Attending: Internal Medicine | Admitting: Internal Medicine

## 2024-08-17 ENCOUNTER — Encounter (HOSPITAL_COMMUNITY): Payer: Self-pay | Admitting: Internal Medicine

## 2024-08-17 ENCOUNTER — Ambulatory Visit (HOSPITAL_COMMUNITY): Admission: RE | Admit: 2024-08-17 | Discharge: 2024-08-17 | Attending: Internal Medicine | Admitting: Internal Medicine

## 2024-08-17 VITALS — BP 130/78 | HR 74 | Ht 70.0 in | Wt 233.0 lb

## 2024-08-17 DIAGNOSIS — E1122 Type 2 diabetes mellitus with diabetic chronic kidney disease: Secondary | ICD-10-CM | POA: Diagnosis not present

## 2024-08-17 DIAGNOSIS — Z79899 Other long term (current) drug therapy: Secondary | ICD-10-CM | POA: Diagnosis not present

## 2024-08-17 DIAGNOSIS — N1831 Chronic kidney disease, stage 3a: Secondary | ICD-10-CM | POA: Insufficient documentation

## 2024-08-17 DIAGNOSIS — N2889 Other specified disorders of kidney and ureter: Secondary | ICD-10-CM | POA: Diagnosis not present

## 2024-08-17 DIAGNOSIS — Z86718 Personal history of other venous thrombosis and embolism: Secondary | ICD-10-CM | POA: Diagnosis not present

## 2024-08-17 DIAGNOSIS — C642 Malignant neoplasm of left kidney, except renal pelvis: Secondary | ICD-10-CM | POA: Diagnosis not present

## 2024-08-17 DIAGNOSIS — I255 Ischemic cardiomyopathy: Secondary | ICD-10-CM | POA: Diagnosis not present

## 2024-08-17 DIAGNOSIS — Z7984 Long term (current) use of oral hypoglycemic drugs: Secondary | ICD-10-CM | POA: Insufficient documentation

## 2024-08-17 DIAGNOSIS — Z7901 Long term (current) use of anticoagulants: Secondary | ICD-10-CM | POA: Diagnosis not present

## 2024-08-17 DIAGNOSIS — I77810 Thoracic aortic ectasia: Secondary | ICD-10-CM | POA: Diagnosis not present

## 2024-08-17 DIAGNOSIS — E785 Hyperlipidemia, unspecified: Secondary | ICD-10-CM | POA: Diagnosis not present

## 2024-08-17 DIAGNOSIS — I5022 Chronic systolic (congestive) heart failure: Secondary | ICD-10-CM | POA: Diagnosis not present

## 2024-08-17 DIAGNOSIS — I5082 Biventricular heart failure: Secondary | ICD-10-CM | POA: Insufficient documentation

## 2024-08-17 DIAGNOSIS — I513 Intracardiac thrombosis, not elsewhere classified: Secondary | ICD-10-CM

## 2024-08-17 DIAGNOSIS — Z87891 Personal history of nicotine dependence: Secondary | ICD-10-CM | POA: Diagnosis not present

## 2024-08-17 DIAGNOSIS — I10 Essential (primary) hypertension: Secondary | ICD-10-CM

## 2024-08-17 DIAGNOSIS — I2510 Atherosclerotic heart disease of native coronary artery without angina pectoris: Secondary | ICD-10-CM | POA: Insufficient documentation

## 2024-08-17 DIAGNOSIS — I4891 Unspecified atrial fibrillation: Secondary | ICD-10-CM | POA: Diagnosis not present

## 2024-08-17 LAB — ECHOCARDIOGRAM COMPLETE
Area-P 1/2: 2.84 cm2
Calc EF: 40.2 %
Est EF: 35
S' Lateral: 4.2 cm
Single Plane A2C EF: 38.1 %
Single Plane A4C EF: 36.7 %

## 2024-08-17 LAB — CBC
HCT: 44.2 % (ref 39.0–52.0)
Hemoglobin: 14 g/dL (ref 13.0–17.0)
MCH: 27.3 pg (ref 26.0–34.0)
MCHC: 31.7 g/dL (ref 30.0–36.0)
MCV: 86.3 fL (ref 80.0–100.0)
Platelets: 184 K/uL (ref 150–400)
RBC: 5.12 MIL/uL (ref 4.22–5.81)
RDW: 17 % — ABNORMAL HIGH (ref 11.5–15.5)
WBC: 5.5 K/uL (ref 4.0–10.5)
nRBC: 0 % (ref 0.0–0.2)

## 2024-08-17 LAB — BASIC METABOLIC PANEL WITH GFR
Anion gap: 8 (ref 5–15)
BUN: 14 mg/dL (ref 8–23)
CO2: 24 mmol/L (ref 22–32)
Calcium: 9.2 mg/dL (ref 8.9–10.3)
Chloride: 106 mmol/L (ref 98–111)
Creatinine, Ser: 1.29 mg/dL — ABNORMAL HIGH (ref 0.61–1.24)
GFR, Estimated: 59 mL/min — ABNORMAL LOW
Glucose, Bld: 88 mg/dL (ref 70–99)
Potassium: 4.2 mmol/L (ref 3.5–5.1)
Sodium: 138 mmol/L (ref 135–145)

## 2024-08-17 MED ORDER — PERFLUTREN LIPID MICROSPHERE
1.0000 mL | INTRAVENOUS | Status: DC | PRN
  Administered 2024-08-17: 3 mL via INTRAVENOUS

## 2024-08-17 NOTE — Patient Instructions (Signed)
 Medication Changes:  None, continue current medications  Lab Work:  Labs done today, your results will be available in MyChart, we will contact you for abnormal readings.  Special Instructions // Education:  Do the following things EVERYDAY: Weigh yourself in the morning before breakfast. Write it down and keep it in a log. Take your medicines as prescribed Eat low salt foods--Limit salt (sodium) to 2000 mg per day.  Stay as active as you can everyday Limit all fluids for the day to less than 2 liters   Follow-Up in: 6 months   At the [facility], you and your health needs are our priority. We have a designated team specialized in the treatment of Heart Failure. This Care Team includes your primary Heart Failure Specialized Cardiologist (physician), Advanced Practice Providers (APPs- Physician Assistants and Nurse Practitioners), and Pharmacist who all work together to provide you with the care you need, when you need it.   You may see any of the following providers on your designated Care Team at your next follow up:  Dr. Toribio Fuel Dr. Ezra Shuck Dr. Odis Brownie Greig Mosses, NP Caffie Shed, [place] [facility] [place], [place] Beckey Coe, NP Jordan Lee, NP Tinnie Redman, PharmD   Please be sure to bring in all your medications bottles to every appointment.   Need to Contact Us :  If you have any questions or concerns before your next appointment please send us  a message through [place] or call our office at [phone].    TO LEAVE A MESSAGE FOR THE NURSE SELECT OPTION 2, PLEASE LEAVE A MESSAGE INCLUDING: YOUR NAME DATE OF BIRTH CALL BACK NUMBER REASON FOR CALL**this is important as we prioritize the call backs  YOU WILL RECEIVE A CALL BACK THE SAME DAY AS LONG AS YOU CALL BEFORE 4:00 PM

## 2024-08-17 NOTE — Addendum Note (Signed)
 Encounter addended by: Buell Powell HERO, RN on: 08/17/2024 11:24 AM  Actions taken: Visit diagnoses modified, Order list changed, Diagnosis association updated, Clinical Note Signed, Charge Capture section accepted

## 2024-08-17 NOTE — Progress Notes (Signed)
 "  [facility]  NOTE  Primary Care: Emil Schaumann, MD HF Cardiologist:  Dr. Cherrie  HPI: Troy May is a 73 y.o. male w/ HTN, HLD, CAD, and BiV CHF/iCM.  Admitted 11/22 w/ CP, Influenza A+. Hypertensive on arrival requiring labetalol  gtt, CT chest - PE but + for cardiomegaly, coronary CA2+, mild aneurysmal dilatation of the ascending thoracic aorta 43 mm; bilateral renal masses suspicious for RCC.  Echo 11/22 EF 25-30% w/LV apical thrombus G2DD, RV moderately HK. R/LHC severe 2V CAD w/ occluded LAD & RCA,  elevated filling pressures w/ low output, CI 1.7. No good options for revasc -> med rx. cMRI LVEF 21%, LAD/RCA territories with scar and do not appear viable, large LV thrombus. Discharge weight 172 lbs.  Echo 3/23: EF 25-30%, + apical thrombus, moderate MR/TR  Follow up 3/23. Toprol  XL 25 added. Referred to EP for ICD. Saw Dr. Fernande for evaluation 12/11/21. Wanted to think about ICD.  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 Follows with Urology q 6 months for surveillance.  Follow up 6/23, off all meds for a few weeks. GDMT restarted. Declined ICD.  Echo 11/23: EF 25-30% RV ok AoRoot 4.1cm  Admitted 5/24 for robotic assisted laparoscopic left partial nephrectomy for RCC. Post op complicated by acute hypoxic respiratory failure. VQ scan revealed PE. Also found to have left leg DVT, provoked in setting of surgery.  Echo EF 25-30%, LV with RWMA with akinesis of the apex.  Mod concentric LVH. GIDD, large apical thrombus. RV mildly reduced, trivial MR. Started on Xarelto .   Today he returns for follow up with his wife. Feels good. No CP or SOB or edema. Tolerating meds well. No bleeding with Xarelto    Echo today 08/17/24: EF 40-45% with apical aneurysm RV ok Personally reviewed    Cardiac Studies: - Echo 5/24 EF 25-30%, LV with RWMA with akinesis of the apex.  Mod concentric LVH. GIDD, large apical thrombus.  RV  mildly reduced, trivial MR. - cMRI (11/22): EF 21% and minimal viability in RCA and LAD territories - [facility] 07/27/21:   Ost RCA to Prox RCA lesion is 100% stenosed.   Prox Cx to Mid Cx lesion is 30% stenosed.   1st Mrg lesion is 20% stenosed.   Prox LAD lesion is 60% stenosed.   Mid LAD lesion is 90% stenosed.   Mid LAD to Dist LAD lesion is 100% stenosed. Findings: Ao = 143/88 (109)  LV = 146/25 RA =  8 RV = 41/12 PA = 45/20 (29) PCW = 21 Fick cardiac output/index = 3.4/1.7 PVR = 2.4 WU SVR = 2383  FA sat = 95% PA sat = 54%, 57% PAPi 3.125   Past Medical History:  Diagnosis Date   Ascending aortic aneurysm    Bilateral renal masses    CAD (coronary artery disease)    CHF (congestive heart failure) (HCC)    HLD (hyperlipidemia)    Hypertension    LV (left ventricular) mural thrombus    Systolic heart failure (HCC)    Current Outpatient Medications  Medication Sig Dispense Refill   atorvastatin  (LIPITOR ) 80 MG tablet TAKE 1 TABLET BY MOUTH EVERY DAY 90 tablet 3   ENTRESTO  97-103 MG TAKE 1 TABLET BY MOUTH TWICE A DAY 180 tablet 3   JARDIANCE  10 MG TABS tablet TAKE 1 TABLET BY MOUTH EVERY DAY 30 tablet 11   metoprolol  succinate (TOPROL -XL) 50 MG 24 hr  tablet TAKE 1 TABLET BY MOUTH EVERYDAY AT BEDTIME 90 tablet 3   Multiple Vitamin (MULTIVITAMIN WITH MINERALS) TABS tablet Take 1 tablet by mouth daily.     rivaroxaban  (XARELTO ) 20 MG TABS tablet Take 1 tablet (20 mg total) by mouth daily with supper. PLEASE SCHEDULE APPOINTMENT FOR MORE REFILLS 30 tablet 11   sildenafil  (VIAGRA ) 100 MG tablet Take 0.5-1 tablets (50-100 mg total) by mouth daily as needed for erectile dysfunction. 5 tablet 11   spironolactone  (ALDACTONE ) 25 MG tablet Take 1 tablet (25 mg total) by mouth daily. 30 tablet 11   torsemide  (DEMADEX ) 20 MG tablet Take 1 tablet (20 mg total) by mouth daily as needed (swelling or weight gain). 30 tablet 6   No current facility-administered medications for this  encounter.   Facility-Administered Medications Ordered in Other Encounters  Medication Dose Route Frequency Provider Last Rate Last Admin   perflutren  lipid microspheres (DEFINITY ) IV suspension  1-10 mL Intravenous PRN Ansel Ferrall, Toribio SAUNDERS, MD   3 mL at 08/17/24 1046   No Known Allergies  Social History   Socioeconomic History   Marital status: Married    Spouse name: Not on file   Number of children: 2   Years of education: 12   Highest education level: High school graduate  Occupational History   Occupation: Retired  Tobacco Use   Smoking status: Former    Current packs/day: 0.00    Types: Cigarettes    Quit date: 07/27/2021    Years since quitting: 3.0    Passive exposure: Past   Smokeless tobacco: Never  Vaping Use   Vaping status: Never Used  Substance and Sexual Activity   Alcohol use: Not Currently   Drug use: Never   Sexual activity: Not on file  Other Topics Concern   Not on file  Social History Narrative   Married   Social Drivers of Health   Tobacco Use: Medium Risk (08/17/2024)   Patient History    Smoking Tobacco Use: Former    Smokeless Tobacco Use: Never    Passive Exposure: Past  Physicist, Medical Strain: Low Risk (12/23/2023)   Overall Financial Resource Strain (CARDIA)    Difficulty of Paying Living Expenses: Not hard at all  Food Insecurity: No Food Insecurity (12/23/2023)   Hunger Vital Sign    Worried About Running Out of Food in the Last Year: Never true    Ran Out of Food in the Last Year: Never true  Transportation Needs: No Transportation Needs (12/23/2023)   PRAPARE - Administrator, Civil Service (Medical): No    Lack of Transportation (Non-Medical): No  Physical Activity: Insufficiently Active (12/23/2023)   Exercise Vital Sign    Days of Exercise per Week: 1 day    Minutes of Exercise per Session: 20 min  Stress: No Stress Concern Present (12/23/2023)   [employer] of Occupational Health - Occupational Stress  Questionnaire    Feeling of Stress : Not at all  Social Connections: Moderately Integrated (12/23/2023)   Social Connection and Isolation Panel    Frequency of Communication with Friends and Family: More than three times a week    Frequency of Social Gatherings with Friends and Family: More than three times a week    Attends Religious Services: 1 to 4 times per year    Active Member of [employer] or Organizations: No    Attends Banker Meetings: Never    Marital Status: Married  Catering Manager Violence: Not At  Risk (12/23/2023)   Humiliation, Afraid, Rape, and Kick questionnaire    Fear of Current or Ex-Partner: No    Emotionally Abused: No    Physically Abused: No    Sexually Abused: No  Depression (PHQ2-9): Low Risk (12/23/2023)   Depression (PHQ2-9)    PHQ-2 Score: 0  Alcohol Screen: Low Risk (12/23/2023)   Alcohol Screen    Last Alcohol Screening Score (AUDIT): 0  Housing: Unknown (12/23/2023)   Housing Stability Vital Sign    Unable to Pay for Housing in the Last Year: No    Number of Times Moved in the Last Year: Not on file    Homeless in the Last Year: No  Utilities: Not At Risk (12/23/2023)   AHC Utilities    Threatened with loss of utilities: No  Health Literacy: Adequate Health Literacy (12/23/2023)   B1300 Health Literacy    Frequency of need for help with medical instructions: Never   Family History  Problem Relation Age of Onset   Hypertension Brother    Heart failure Neg Hx    BP 130/78   Pulse 74   Ht 5' 10 (1.778 m)   Wt 105.7 kg (233 lb)   SpO2 96%   BMI 33.43 kg/m   Wt Readings from Last 3 Encounters:  08/17/24 105.7 kg (233 lb)  12/23/23 102.1 kg (225 lb)  11/07/23 102.1 kg (225 lb)   PHYSICAL EXAM: General:  Sitting up. No resp difficulty HEENT: normal Neck: supple. no JVD.  Cor: Regular rate & rhythm. No rubs, gallops or murmurs. Lungs: clear Abdomen: soft, nontender, nondistended.Good bowel sounds. Extremities: no cyanosis,  clubbing, rash, edema Neuro: alert & orientedx3, cranial nerves grossly intact. moves all 4 extremities w/o difficulty. 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 - Echo (10/30/21): EF 25-30% + apical clot. Moderate MR/TR. - Echo 11/23 EF 25-20% RV ok - Echo 5/24 EF 25-30%, LV with RWMA with akinesis of the apex.  Mod concentric LVH. GIDD, large apical thrombus.  RV mildly reduced, trivial MR. - Echo today 08/17/24: EF 40-45% with apical aneurysm RV ok Personally reviewed - Doing well NYHA I Volume ok  - Continue Entresto  97/103 mg bid. - Continue spiro 12.5 mg daily.  - Continue torsemide  20 mg PRN. Has not needed recently.  - Continue Jardiance  10 mg daily.  - Continue Toprol  XL 50 mg q hs. - Due for bloodwork today  2. CAD - Cath 11/22 severe 2V CAD, mid-distal LAD 100% stenosed, ost-prox RCA 100% stenosed  - cMRI 11/22  EF 21% and larg  inferior and apical scars with minimal viability in the RCA/LAD territories. Large LV clot.  - No options for revascularization, continue medical therapy. - No s/s angina - Continue  atorvastatin  80 + ? blocker. - On Xarelto    3. LV Thrombus  - 2/2 severe LV dysfunction, seen again on echo 5/24 - With LV aneurysm and h/o DVT/PE would use lifelong AC   4. Bilateral Renal Masses, RCC - s/p L partial nephrectomy  01/07/23   5. Hypertension  - Blood pressure well controlled. Continue current regimen.  6. Ascending Thoracic Aortic Aneurysm - 43 mm in diameter on chest CT - Asc Ao 4.0 cm on  echo 03/23 stable at 4.1 on echo 11/23 - Asc Ao 3.8 on 5/24 echo - AoRoot 3.6 cm on echo today 08/17/24. Asc ao not well assessed - Continue to follow  7. CKD 3a - continue SGLT2i -Recent  Scr 1.39. stable  8. PE/DVT - on Xarelto  as above - provoked DVT post surgery - Continue AC as above   Toribio Fuel, MD  11:10 AM "

## 2024-09-08 ENCOUNTER — Other Ambulatory Visit (HOSPITAL_COMMUNITY): Payer: Self-pay | Admitting: Family Medicine

## 2024-09-19 ENCOUNTER — Other Ambulatory Visit (HOSPITAL_COMMUNITY): Payer: Self-pay

## 2024-09-28 ENCOUNTER — Other Ambulatory Visit (HOSPITAL_COMMUNITY): Payer: Self-pay

## 2024-12-25 ENCOUNTER — Ambulatory Visit

## 2025-02-15 ENCOUNTER — Ambulatory Visit (HOSPITAL_COMMUNITY)
